# Patient Record
Sex: Female | Born: 1980
Health system: Southern US, Community
[De-identification: ages and names within clinical notes are randomized; demographics above are authoritative.]

## PROBLEM LIST (undated history)

## (undated) DIAGNOSIS — K219 Gastro-esophageal reflux disease without esophagitis: Secondary | ICD-10-CM

## (undated) DIAGNOSIS — Z309 Encounter for contraceptive management, unspecified: Secondary | ICD-10-CM

## (undated) DIAGNOSIS — F419 Anxiety disorder, unspecified: Secondary | ICD-10-CM

## (undated) DIAGNOSIS — R87629 Unspecified abnormal cytological findings in specimens from vagina: Secondary | ICD-10-CM

## (undated) DIAGNOSIS — G43909 Migraine, unspecified, not intractable, without status migrainosus: Secondary | ICD-10-CM

## (undated) DIAGNOSIS — G5601 Carpal tunnel syndrome, right upper limb: Secondary | ICD-10-CM

## (undated) DIAGNOSIS — F32A Depression, unspecified: Secondary | ICD-10-CM

## (undated) DIAGNOSIS — Z975 Presence of (intrauterine) contraceptive device: Secondary | ICD-10-CM

## (undated) DIAGNOSIS — N926 Irregular menstruation, unspecified: Secondary | ICD-10-CM

## (undated) DIAGNOSIS — F329 Major depressive disorder, single episode, unspecified: Secondary | ICD-10-CM

## (undated) DIAGNOSIS — T7840XA Allergy, unspecified, initial encounter: Secondary | ICD-10-CM

## (undated) HISTORY — DX: Allergy, unspecified, initial encounter: T78.40XA

## (undated) HISTORY — DX: Gastro-esophageal reflux disease without esophagitis: K21.9

## (undated) HISTORY — DX: Presence of (intrauterine) contraceptive device: Z97.5

## (undated) HISTORY — DX: Unspecified abnormal cytological findings in specimens from vagina: R87.629

## (undated) HISTORY — PX: NO PAST SURGERIES: SHX2092

## (undated) HISTORY — DX: Encounter for contraceptive management, unspecified: Z30.9

## (undated) HISTORY — DX: Irregular menstruation, unspecified: N92.6

## (undated) HISTORY — DX: Major depressive disorder, single episode, unspecified: F32.9

## (undated) HISTORY — DX: Carpal tunnel syndrome, right upper limb: G56.01

## (undated) HISTORY — DX: Depression, unspecified: F32.A

---

## 2012-01-30 ENCOUNTER — Emergency Department (HOSPITAL_COMMUNITY)
Admission: EM | Admit: 2012-01-30 | Discharge: 2012-01-30 | Disposition: A | Discharge status: Home / Self Care | Payer: BC Managed Care – PPO | Attending: Emergency Medicine | Admitting: Emergency Medicine

## 2012-01-30 ENCOUNTER — Encounter (HOSPITAL_COMMUNITY): Payer: Self-pay | Admitting: Emergency Medicine

## 2012-01-30 DIAGNOSIS — R109 Unspecified abdominal pain: Secondary | ICD-10-CM | POA: Insufficient documentation

## 2012-01-30 DIAGNOSIS — Z79899 Other long term (current) drug therapy: Secondary | ICD-10-CM | POA: Insufficient documentation

## 2012-01-30 HISTORY — DX: Migraine, unspecified, not intractable, without status migrainosus: G43.909

## 2012-01-30 LAB — BASIC METABOLIC PANEL
BUN: 8 mg/dL (ref 6–23)
CO2: 26 mEq/L (ref 19–32)
Calcium: 8.9 mg/dL (ref 8.4–10.5)
Creatinine, Ser: 0.66 mg/dL (ref 0.50–1.10)
Glucose, Bld: 103 mg/dL — ABNORMAL HIGH (ref 70–99)

## 2012-01-30 LAB — URINALYSIS, ROUTINE W REFLEX MICROSCOPIC
Bilirubin Urine: NEGATIVE
Hgb urine dipstick: NEGATIVE
Specific Gravity, Urine: 1.005 (ref 1.005–1.030)
Urobilinogen, UA: 0.2 mg/dL (ref 0.0–1.0)

## 2012-01-30 LAB — WET PREP, GENITAL: Yeast Wet Prep HPF POC: NONE SEEN

## 2012-01-30 LAB — POCT PREGNANCY, URINE: Preg Test, Ur: NEGATIVE

## 2012-01-30 NOTE — ED Provider Notes (Signed)
History     CSN: [id number]  Arrival date & time 01/30/12  4098   First MD Initiated Contact with Patient 01/30/12 [phone]      Chief Complaint  Patient presents with  . Abdominal Pain    (Consider location/radiation/quality/duration/timing/severity/associated sxs/prior treatment) HPI Comments: Patient presents for evaluation of abdominal pain.  She reports around 6 PM yesterday evening as she was driving home from the beach she had suprapubic pressure sensation which lasted for 3 hours and then resolved.  The symptoms resolved on their own, she did not take any medications.  She had an uneventful night but when she woke this morning at 545 she had a return of the suprapubic pressure but also sharp pain which radiated into her umbilicus and left upper quadrant area.  She denies having any flank pain during this event.  The symptoms lasted for approximately 15 minutes and has since resolved.  She still has a little suprapubic pressure sensation but no  remaining sharp pain.  She denies nausea or vomiting, no fevers.  She did have rectal pain which she noticed when she tried to sit down this morning, which has also resolved.  She denies constipation, her last bowel movement was yesterday and normal.  She is not currently sexually active, denies vaginal discharge.  She is in the third week of her OCP pill pack.    Patient is a 31 y.o. female presenting with abdominal pain. The history is provided by the patient.  Abdominal Pain The primary symptoms of the illness include abdominal pain. The primary symptoms of the illness do not include fever, shortness of breath, nausea, vomiting, diarrhea, dysuria or vaginal discharge. The current episode started 3 to 5 hours ago. The onset of the illness was sudden. The problem has been resolved.  Symptoms associated with the illness do not include constipation or urgency.    Past Medical History  Diagnosis Date  . Migraines     History reviewed. No  pertinent past surgical history.  History reviewed. No pertinent family history.  History  Substance Use Topics  . Smoking status: Never Smoker   . Smokeless tobacco: Not on file  . Alcohol Use: Yes     occ    OB History    Grav Para Term Preterm Abortions TAB SAB Ect Mult Living                  Review of Systems  Constitutional: Negative for fever.  HENT: Negative for congestion, sore throat and neck pain.   Eyes: Negative.   Respiratory: Negative for chest tightness and shortness of breath.   Cardiovascular: Negative for chest pain.  Gastrointestinal: Positive for abdominal pain. Negative for nausea, vomiting, diarrhea and constipation.  Genitourinary: Negative.  Negative for dysuria, urgency, flank pain, vaginal discharge, difficulty urinating and pelvic pain.  Musculoskeletal: Negative for joint swelling and arthralgias.  Skin: Negative.  Negative for rash and wound.  Neurological: Negative for dizziness, weakness, light-headedness, numbness and headaches.  Hematological: Negative.   Psychiatric/Behavioral: Negative.     Allergies  Review of patient's allergies indicates no known allergies.  Home Medications   Current Outpatient Rx  Name Route Sig Dispense Refill  . ACETAMINOPHEN 500 MG PO TABS Oral Take 1,000 mg by mouth every 6 (six) hours as needed.    Suzzanne Cloud ESTRADIOL 0.25-35 MG-MCG PO TABS Oral Take 1 tablet by mouth daily.    [name] RIZATRIPTAN BENZOATE 10 MG PO TBDP Oral Take 10 mg by mouth as  needed. Migraine May repeat in 2 hours if needed    . ALLERGY RELIEF EYE DROPS OP Ophthalmic Apply 1 drop to eye daily as needed. Itchy Eyes      BP 126/76  Pulse 72  Temp(Src) 98 F (36.7 C) (Oral)  Resp 16  Ht 5\' 4"  (1.626 m)  Wt 241 lb (109.317 kg)  BMI 41.37 kg/m2  SpO2 100%  LMP 12/29/2011  Physical Exam  Nursing note and vitals reviewed. Constitutional: She appears well-developed and well-nourished.  HENT:  Head: Normocephalic and atraumatic.   Eyes: Conjunctivae are normal.  Neck: Normal range of motion.  Cardiovascular: Normal rate, regular rhythm, normal heart sounds and intact distal pulses.   Pulmonary/Chest: Effort normal and breath sounds normal. She has no wheezes.  Abdominal: Soft. Bowel sounds are normal. There is no tenderness.  Genitourinary: Vagina normal and uterus normal. No erythema around the vagina. No vaginal discharge found.       Slight suprapubic pressure sensation on bimanual exam.  No cervical motion tenderness, no adnexal tenderness or mass appreciated.  Musculoskeletal: Normal range of motion.  Neurological: She is alert.  Skin: Skin is warm and dry.  Psychiatric: She has a normal mood and affect.    ED Course  Procedures (including critical care time)  Labs Reviewed  WET PREP, GENITAL - Abnormal; Notable for the following:    Clue Cells Wet Prep HPF POC FEW (*)    WBC, Wet Prep HPF POC FEW (*)    All other components within normal limits  BASIC METABOLIC PANEL - Abnormal; Notable for the following:    Glucose, Bld 103 (*)    All other components within normal limits  URINALYSIS, ROUTINE W REFLEX MICROSCOPIC  PREGNANCY, URINE  POCT PREGNANCY, URINE  GC/CHLAMYDIA PROBE AMP, GENITAL  RPR   No results found.   1. Abdominal pain       MDM  Abdominal pain of unclear etiology,  But now resolved.  Pt discussed with Dr. Manus Gunning who also saw pt.  Encouraged f/u with pcp in 1 day if symptoms return.  Labs reviewed.  No sx to suggest bacterial vaginosis.        Burgess Amor, [place] 01/30/12 1044

## 2012-01-30 NOTE — Discharge Instructions (Signed)
Followup with your doctor for a recheck of your symptoms if your pain persists.  Your exam and labs performed today do not show [place] the reason for your symptoms at this time.  Symptoms can change over time,  However.  Get rechecked if they return,  Worsen or change in any way.

## 2012-01-30 NOTE — ED Provider Notes (Signed)
Medical screening examination/treatment/procedure(s) were conducted as a shared visit with non-physician practitioner(s) and myself.  I personally evaluated the patient during the encounter  Suprapubic pressure last night.  UA neg.  Pelvic neg. Abdomen soft and nontender.  Glynn Octave, MD 01/30/12 1520

## 2012-01-30 NOTE — ED Notes (Signed)
Pt states abd pain since last night. Denies vaginal discharge.pressure and pain on urination this am.

## 2012-01-30 NOTE — ED Notes (Signed)
EDPA is in the room with pt at this time.

## 2012-01-31 LAB — RPR: RPR Ser Ql: NONREACTIVE

## 2012-01-31 LAB — GC/CHLAMYDIA PROBE AMP, GENITAL: GC Probe Amp, Genital: NEGATIVE

## 2012-10-03 NOTE — L&D Delivery Note (Signed)
Delivery Note At 7:36 AM a viable female was delivered via Vaginal, Spontaneous Delivery (Presentation: ; Occiput Anterior) with immediate skin-to-skin on mother's abdomen. Cord was allowed to continue pulsating and then clamped, cut by family member. Cord blood sample obtained.   APGAR: 9, 9; weight 6-12.   Placenta status: Intact, Spontaneous. Cord: 3 vessels and short.  Cord pH: Not obtained  Anesthesia: Epidural  Episiotomy: None Lacerations: None Suture Repair: N/A Est. Blood Loss (mL):  Mom to postpartum.  Baby to Couplet care / Skin to Skin. Dorathy Kinsman, CNM was present and provided assistance during the delivery.   Hazeline Junker 08/28/2013, 8:04 AM  I was present for and assisted in the delivery of baby and placenta and agree with above.  Slaughters, CNM 08/28/2013 11:06 AM

## 2013-01-08 ENCOUNTER — Other Ambulatory Visit: Payer: Self-pay | Admitting: Obstetrics & Gynecology

## 2013-01-08 DIAGNOSIS — O3680X0 Pregnancy with inconclusive fetal viability, not applicable or unspecified: Secondary | ICD-10-CM

## 2013-01-15 ENCOUNTER — Other Ambulatory Visit: Payer: Self-pay | Admitting: Obstetrics & Gynecology

## 2013-01-15 ENCOUNTER — Ambulatory Visit (INDEPENDENT_AMBULATORY_CARE_PROVIDER_SITE_OTHER): Payer: BC Managed Care – PPO

## 2013-01-15 DIAGNOSIS — O3680X0 Pregnancy with inconclusive fetal viability, not applicable or unspecified: Secondary | ICD-10-CM

## 2013-01-15 NOTE — Progress Notes (Signed)
U/S(6+1wks)-transvaginal u/s performed, single IUP with + FCA noted FHR=108BPM,CRL c/w dates, cx long and closed, bilateral adnexa wnl, no free fluid noted.

## 2013-01-16 LAB — US OB TRANSVAGINAL

## 2013-01-23 ENCOUNTER — Telehealth: Payer: Self-pay | Admitting: Adult Health

## 2013-01-23 NOTE — Telephone Encounter (Signed)
Pt states [redacted] weeks pregnant and having migraines, informed pt can take OTC tylenol for headaches, pt stated had not been drinking as much caffeine, explained to pt headaches/migraines could be from recent decrease in caffeine intake. Pt to call back if no improvement.

## 2013-01-29 ENCOUNTER — Other Ambulatory Visit (HOSPITAL_COMMUNITY)
Admission: RE | Admit: 2013-01-29 | Discharge: 2013-01-29 | Disposition: A | Discharge status: Home / Self Care | Payer: BC Managed Care – PPO | Source: Ambulatory Visit | Attending: Obstetrics & Gynecology | Admitting: Obstetrics & Gynecology

## 2013-01-29 ENCOUNTER — Encounter: Payer: Self-pay | Admitting: Women's Health

## 2013-01-29 ENCOUNTER — Ambulatory Visit (INDEPENDENT_AMBULATORY_CARE_PROVIDER_SITE_OTHER): Payer: BC Managed Care – PPO | Admitting: Women's Health

## 2013-01-29 VITALS — BP 142/80 | Wt 247.8 lb

## 2013-01-29 DIAGNOSIS — Z1151 Encounter for screening for human papillomavirus (HPV): Secondary | ICD-10-CM | POA: Insufficient documentation

## 2013-01-29 DIAGNOSIS — Z3480 Encounter for supervision of other normal pregnancy, unspecified trimester: Secondary | ICD-10-CM | POA: Insufficient documentation

## 2013-01-29 DIAGNOSIS — Z3481 Encounter for supervision of other normal pregnancy, first trimester: Secondary | ICD-10-CM

## 2013-01-29 DIAGNOSIS — E669 Obesity, unspecified: Secondary | ICD-10-CM

## 2013-01-29 DIAGNOSIS — O99211 Obesity complicating pregnancy, first trimester: Secondary | ICD-10-CM

## 2013-01-29 DIAGNOSIS — Z01419 Encounter for gynecological examination (general) (routine) without abnormal findings: Secondary | ICD-10-CM | POA: Insufficient documentation

## 2013-01-29 DIAGNOSIS — Z136 Encounter for screening for cardiovascular disorders: Secondary | ICD-10-CM

## 2013-01-29 DIAGNOSIS — O99019 Anemia complicating pregnancy, unspecified trimester: Secondary | ICD-10-CM

## 2013-01-29 DIAGNOSIS — Z113 Encounter for screening for infections with a predominantly sexual mode of transmission: Secondary | ICD-10-CM | POA: Insufficient documentation

## 2013-01-29 DIAGNOSIS — O99210 Obesity complicating pregnancy, unspecified trimester: Secondary | ICD-10-CM

## 2013-01-29 DIAGNOSIS — Z331 Pregnant state, incidental: Secondary | ICD-10-CM

## 2013-01-29 DIAGNOSIS — Z1389 Encounter for screening for other disorder: Secondary | ICD-10-CM

## 2013-01-29 LAB — CBC
HCT: 35 % — ABNORMAL LOW (ref 36.0–46.0)
MCH: 26.9 pg (ref 26.0–34.0)
MCHC: 32.9 g/dL (ref 30.0–36.0)
RDW: 13.9 % (ref 11.5–15.5)

## 2013-01-29 LAB — POCT URINALYSIS DIPSTICK
Glucose, UA: NEGATIVE
Nitrite, UA: NEGATIVE

## 2013-01-29 NOTE — Progress Notes (Signed)
  Subjective:    Kristin [name] is a 32 y.o. G43P1001 african-american female at [redacted]w[redacted]d by LMP being seen today for her first obstetrical visit.  Her obstetrical history is significant for obesity.  Pt denies h/o HTN or DM. States her brother just passed away unexpectedly the day before she found out about pregnancy from unknown cause- it was determined he had a brain cyst, swelling of the brain, and enlarged heart.  Pregnancy history fully reviewed.  Patient reports nausea w/o vomiting-declines needing antiemetics at this time, cramping w/o bleeding.   Filed Vitals:   01/29/13 1533  BP: 142/80  Weight: 247 lb 12.8 oz (112.401 kg)    HISTORY: OB History   Grav Para Term Preterm Abortions TAB SAB Ect Mult Living   2 1 1       1      # Outc Date GA Lbr Len/2nd Wgt Sex Del Anes PTL Lv   1 TRM 2000 [redacted]w[redacted]d  7lb(3.175kg) M SVD None  Yes   2 CUR              Past Medical History  Diagnosis Date  . Migraines    Past Surgical History  Procedure Laterality Date  . No past surgeries     Family History  Problem Relation Age of Onset  . Other Mother     enlarged heart  . Diabetes Mother   . Hypertension Mother   . Hyperlipidemia Mother   . Hypertension Father   . Hyperlipidemia Father   . Other Brother     enlarged heart     Exam       Pelvic Exam:    Perineum: Normal Perineum   Vulva: normal   Vagina:  normal mucosa, normal discharge, no palpable nodules   Uterus    normal size/shape/contour for 8wks     Cervix: normal   Adnexa: Not palpable   Urinary:  urethral meatus normal    System: Breast:  deferred   Skin: normal coloration and turgor, no rashes    Neurologic: oriented, normal, normal mood   Extremities: normal strength, tone, and muscle mass   HEENT PERRLA   Mouth/Teeth mucous membranes moist, pharynx normal without lesions   Neck supple and no masses   Cardiovascular: regular rate and rhythm   Respiratory:  appears well, vitals normal, no respiratory  distress, acyanotic, normal RR   Abdomen: soft, non-tender; bowel sounds normal; no masses,  no organomegaly       Thin prep pap w/ HPV cotesting obtained today +FCA via informal transabdominal u/s   Assessment:    Pregnancy: G2P1001 Patient Active Problem List   Diagnosis Date Noted  . Supervision of other normal pregnancy 01/29/2013    Priority: High   [redacted]w[redacted]d SIUP G2P1001 BMI 42 Nausea of pregnancy Cramping w/o bleeding Elevated BP today H/O migraines    Plan:    Initial labs drawn, including TSH and A1C Continue Prenatal vitamins. Problem list reviewed and updated. Genetic Screening discussed Integrated Screen: requested. Ultrasound discussed; fetal survey: requested. CF screening requested Discussed nausea relief measures, to notify us if wants antiemetics Reviewed warning s/s to report Follow up Thurs for BP recheck- if still elevated obtain 24hr urine F/U 4wks for 1st IT/NT and LROB  Marge Duncans 01/29/2013

## 2013-01-29 NOTE — Patient Instructions (Signed)
Pregnancy - First Trimester During sexual intercourse, millions of sperm go into the vagina. Only 1 sperm will penetrate and fertilize the female egg while it is in the Fallopian tube. One week later, the fertilized egg implants into the wall of the uterus. An embryo begins to develop into a baby. At 6 to 8 weeks, the eyes and face are formed and the heartbeat can be seen on ultrasound. At the end of 12 weeks (first trimester), all the baby's organs are formed. Now that you are pregnant, you will want to do everything you can to have a healthy baby. Two of the most important things are to get good prenatal care and follow your caregiver's instructions. Prenatal care is all the medical care you receive before the baby's birth. It is given to prevent, find, and treat problems during the pregnancy and childbirth. PRENATAL EXAMS  During prenatal visits, your weight, blood pressure and urine are checked. This is done to make sure you are healthy and progressing normally during the pregnancy.  A pregnant woman should gain 25 to 35 pounds during the pregnancy. However, if you are over weight or underweight, your caregiver will advise you regarding your weight.  Your caregiver will ask and answer questions for you.  Blood work, cervical cultures, other necessary tests and a Pap test are done during your prenatal exams. These tests are done to check on your health and the probable health of your baby. Tests are strongly recommended and done for HIV with your permission. This is the virus that causes AIDS. These tests are done because medications can be given to help prevent your baby from being born with this infection should you have been infected without knowing it. Blood work is also used to find out your blood type, previous infections and follow your blood levels (hemoglobin).  Low hemoglobin (anemia) is common during pregnancy. Iron and vitamins are given to help prevent this. Later in the pregnancy, blood  tests for diabetes will be done along with any other tests if any problems develop. You may need tests to make sure you and the baby are doing well.  You may need other tests to make sure you and the baby are doing well. CHANGES DURING THE FIRST TRIMESTER (THE FIRST 3 MONTHS OF PREGNANCY) Your body goes through many changes during pregnancy. They vary from person to person. Talk to your caregiver about changes you notice and are concerned about. Changes can include:  Your menstrual period stops.  The egg and sperm carry the genes that determine what you look like. Genes from you and your partner are forming a baby. The female genes determine whether the baby is a boy or a girl.  Your body increases in girth and you may feel bloated.  Feeling sick to your stomach (nauseous) and throwing up (vomiting). If the vomiting is uncontrollable, call your caregiver.  Your breasts will begin to enlarge and become tender.  Your nipples may stick out more and become darker.  The need to urinate more. Painful urination may mean you have a bladder infection.  Tiring easily.  Loss of appetite.  Cravings for certain kinds of food.  At first, you may gain or lose a couple of pounds.  You may have changes in your emotions from day to day (excited to be pregnant or concerned something may go wrong with the pregnancy and baby).  You may have more vivid and strange dreams. HOME CARE INSTRUCTIONS   It is very important   to avoid all smoking, alcohol and un-prescribed drugs during your pregnancy. These affect the formation and growth of the baby. Avoid chemicals while pregnant to ensure the delivery of a healthy infant.  Start your prenatal visits by the 12th week of pregnancy. They are usually scheduled monthly at first, then more often in the last 2 months before delivery. Keep your caregiver's appointments. Follow your caregiver's instructions regarding medication use, blood and lab tests, exercise, and  diet.  During pregnancy, you are providing food for you and your baby. Eat regular, well-balanced meals. Choose foods such as meat, fish, milk and other low fat dairy products, vegetables, fruits, and whole-grain breads and cereals. Your caregiver will tell you of the ideal weight gain.  You can help morning sickness by keeping soda crackers at the bedside. Eat a couple before arising in the morning. You may want to use the crackers without salt on them.  Eating 4 to 5 small meals rather than 3 large meals a day also may help the nausea and vomiting.  Drinking liquids between meals instead of during meals also seems to help nausea and vomiting.  A physical sexual relationship may be continued throughout pregnancy if there are no other problems. Problems may be early (premature) leaking of amniotic fluid from the membranes, vaginal bleeding, or belly (abdominal) pain.  Exercise regularly if there are no restrictions. Check with your caregiver or physical therapist if you are unsure of the safety of some of your exercises. Greater weight gain will occur in the last 2 trimesters of pregnancy. Exercising will help:  Control your weight.  Keep you in shape.  Prepare you for labor and delivery.  Help you lose your pregnancy weight after you deliver your baby.  Wear a good support or jogging bra for breast tenderness during pregnancy. This may help if worn during sleep too.  Ask when prenatal classes are available. Begin classes when they are offered.  Do not use hot tubs, steam rooms or saunas.  Wear your seat belt when driving. This protects you and your baby if you are in an accident.  Avoid raw meat, uncooked cheese, cat litter boxes and soil used by cats throughout the pregnancy. These carry germs that can cause birth defects in the baby.  The first trimester is a good time to visit your dentist for your dental health. Getting your teeth cleaned is OK. Use a softer toothbrush and brush  gently during pregnancy.  Ask for help if you have financial, counseling or nutritional needs during pregnancy. Your caregiver will be able to offer counseling for these needs as well as refer you for other special needs.  Do not take any medications or herbs unless told by your caregiver.  Inform your caregiver if there is any mental or physical domestic violence.  Make a list of emergency phone numbers of family, friends, hospital, and police and fire departments.  Write down your questions. Take them to your prenatal visit.  Do not douche.  Do not cross your legs.  If you have to stand for long periods of time, rotate you feet or take small steps in a circle.  You may have more vaginal secretions that may require a sanitary pad. Do not use tampons or scented sanitary pads. MEDICATIONS AND DRUG USE IN PREGNANCY  Take prenatal vitamins as directed. The vitamin should contain 1 milligram of folic acid. Keep all vitamins out of reach of children. Only a couple vitamins or tablets containing iron may be   fatal to a baby or young child when ingested.  Avoid use of all medications, including herbs, over-the-counter medications, not prescribed or suggested by your caregiver. Only take over-the-counter or prescription medicines for pain, discomfort, or fever as directed by your caregiver. Do not use aspirin, ibuprofen, or naproxen unless directed by your caregiver.  Let your caregiver also know about herbs you may be using.  Alcohol is related to a number of birth defects. This includes fetal alcohol syndrome. All alcohol, in any form, should be avoided completely. Smoking will cause low birth rate and premature babies.  Street or illegal drugs are very harmful to the baby. They are absolutely forbidden. A baby born to an addicted mother will be addicted at birth. The baby will go through the same withdrawal an adult does.  Let your caregiver know about any medications that you have to take  and for what reason you take them. MISCARRIAGE IS COMMON DURING PREGNANCY A miscarriage does not mean you did something wrong. It is not a reason to worry about getting pregnant again. Your caregiver will help you with questions you may have. If you have a miscarriage, you may need minor surgery. SEEK MEDICAL CARE IF:  You have any concerns or worries during your pregnancy. It is better to call with your questions if you feel they cannot wait, rather than worry about them. SEEK IMMEDIATE MEDICAL CARE IF:   An unexplained oral temperature above 102 F (38.9 C) develops, or as your caregiver suggests.  You have leaking of fluid from the vagina (birth canal). If leaking membranes are suspected, take your temperature and inform your caregiver of this when you call.  There is vaginal spotting or bleeding. Notify your caregiver of the amount and how many pads are used.  You develop a bad smelling vaginal discharge with a change in the color.  You continue to feel sick to your stomach (nauseated) and have no relief from remedies suggested. You vomit blood or coffee ground-like materials.  You lose more than 2 pounds of weight in 1 week.  You gain more than 2 pounds of weight in 1 week and you notice swelling of your face, hands, feet, or legs.  You gain 5 pounds or more in 1 week (even if you do not have swelling of your hands, face, legs, or feet).  You get exposed to German measles and have never had them.  You are exposed to fifth disease or chickenpox.  You develop belly (abdominal) pain. Round ligament discomfort is a common non-cancerous (benign) cause of abdominal pain in pregnancy. Your caregiver still must evaluate this.  You develop headache, fever, diarrhea, pain with urination, or shortness of breath.  You fall or are in a car accident or have any kind of trauma.  There is mental or physical violence in your home. Document Released: 09/13/2001 Document Revised: 12/12/2011  Document Reviewed: 03/17/2009 [employer] Patient Information 2013 [employer]  

## 2013-01-29 NOTE — Progress Notes (Signed)
menstral cramps in lower belly. New ob packet given. Consents signed.

## 2013-01-30 LAB — URINALYSIS
Hgb urine dipstick: NEGATIVE
Leukocytes, UA: NEGATIVE
Protein, ur: NEGATIVE mg/dL
Urobilinogen, UA: 0.2 mg/dL (ref 0.0–1.0)

## 2013-01-30 LAB — OXYCODONE SCREEN, UA, RFLX CONFIRM: Oxycodone Screen, Ur: NEGATIVE ng/mL

## 2013-01-30 LAB — DRUG SCREEN, URINE, NO CONFIRMATION
Cocaine Metabolites: NEGATIVE
Methadone: NEGATIVE
Phencyclidine (PCP): NEGATIVE
Propoxyphene: NEGATIVE

## 2013-01-30 LAB — RUBELLA SCREEN: Rubella: 3.29 Index — ABNORMAL HIGH (ref <0.90)

## 2013-01-30 LAB — ABO AND RH: Rh Type: POSITIVE

## 2013-01-30 LAB — VARICELLA ZOSTER ANTIBODY, IGG: Varicella IgG: 2062 Index — ABNORMAL HIGH (ref <135.00)

## 2013-01-30 LAB — HEMOGLOBIN A1C: Hgb A1c MFr Bld: 4.9 % (ref <5.7)

## 2013-01-30 LAB — RPR

## 2013-01-30 LAB — SICKLE CELL SCREEN: Sickle Cell Screen: NEGATIVE

## 2013-01-31 ENCOUNTER — Encounter: Payer: Self-pay | Admitting: Obstetrics & Gynecology

## 2013-01-31 ENCOUNTER — Ambulatory Visit (INDEPENDENT_AMBULATORY_CARE_PROVIDER_SITE_OTHER): Payer: BC Managed Care – PPO | Admitting: Obstetrics & Gynecology

## 2013-01-31 VITALS — BP 112/72 | Wt 246.0 lb

## 2013-01-31 DIAGNOSIS — Z331 Pregnant state, incidental: Secondary | ICD-10-CM

## 2013-01-31 DIAGNOSIS — E669 Obesity, unspecified: Secondary | ICD-10-CM

## 2013-01-31 DIAGNOSIS — O99019 Anemia complicating pregnancy, unspecified trimester: Secondary | ICD-10-CM

## 2013-01-31 DIAGNOSIS — Z1389 Encounter for screening for other disorder: Secondary | ICD-10-CM

## 2013-01-31 LAB — POCT URINALYSIS DIPSTICK
Blood, UA: NEGATIVE
Ketones, UA: NEGATIVE
Protein, UA: NEGATIVE

## 2013-01-31 LAB — URINE CULTURE: Colony Count: 40000

## 2013-01-31 NOTE — Progress Notes (Signed)
BP good no meds needed at this point Pt understands she may need them later Keep scheduled appt

## 2013-01-31 NOTE — Progress Notes (Signed)
Cramping in lower belly.

## 2013-02-01 LAB — CYSTIC FIBROSIS DIAGNOSTIC STUDY

## 2013-02-05 ENCOUNTER — Telehealth: Payer: Self-pay | Admitting: Obstetrics & Gynecology

## 2013-02-05 MED ORDER — PRENATAL PLUS 27-1 MG PO TABS: 1.0000 | ORAL_TABLET | Freq: Every day | ORAL | Status: DC

## 2013-02-05 NOTE — Telephone Encounter (Signed)
Pt informed prenatal plus e-scribed to pharmacy

## 2013-02-28 ENCOUNTER — Encounter: Payer: Self-pay | Admitting: Advanced Practice Midwife

## 2013-02-28 ENCOUNTER — Other Ambulatory Visit: Payer: Self-pay | Admitting: Advanced Practice Midwife

## 2013-02-28 ENCOUNTER — Ambulatory Visit (INDEPENDENT_AMBULATORY_CARE_PROVIDER_SITE_OTHER): Payer: Medicaid Other | Admitting: Advanced Practice Midwife

## 2013-02-28 ENCOUNTER — Ambulatory Visit (INDEPENDENT_AMBULATORY_CARE_PROVIDER_SITE_OTHER): Payer: Medicaid Other

## 2013-02-28 VITALS — BP 120/72 | Wt 246.0 lb

## 2013-02-28 DIAGNOSIS — Z36 Encounter for antenatal screening of mother: Secondary | ICD-10-CM

## 2013-02-28 DIAGNOSIS — Z1389 Encounter for screening for other disorder: Secondary | ICD-10-CM

## 2013-02-28 DIAGNOSIS — O99210 Obesity complicating pregnancy, unspecified trimester: Secondary | ICD-10-CM

## 2013-02-28 DIAGNOSIS — E669 Obesity, unspecified: Secondary | ICD-10-CM

## 2013-02-28 DIAGNOSIS — Z331 Pregnant state, incidental: Secondary | ICD-10-CM

## 2013-02-28 DIAGNOSIS — O99019 Anemia complicating pregnancy, unspecified trimester: Secondary | ICD-10-CM

## 2013-02-28 DIAGNOSIS — Z3481 Encounter for supervision of other normal pregnancy, first trimester: Secondary | ICD-10-CM

## 2013-02-28 LAB — POCT URINALYSIS DIPSTICK
Glucose, UA: NEGATIVE
Leukocytes, UA: NEGATIVE
Nitrite, UA: NEGATIVE

## 2013-02-28 NOTE — Progress Notes (Signed)
No c/o at this time. Had NT today (see report).  Routine questions about pregnancy answered.  F/U in 4 weeks for 2nd IT/LROB.

## 2013-02-28 NOTE — Progress Notes (Signed)
Cramping. 1st IT NT today.

## 2013-02-28 NOTE — Progress Notes (Signed)
U/S(12+3wks)-single IUP with +FCA, CRL c/w dates, cx long and closed, bilateral adnexa wnl, NB present, **NT=2.45mm**

## 2013-03-02 ENCOUNTER — Encounter: Payer: Self-pay | Admitting: Women's Health

## 2013-03-07 ENCOUNTER — Ambulatory Visit (INDEPENDENT_AMBULATORY_CARE_PROVIDER_SITE_OTHER): Payer: Medicaid Other | Admitting: Adult Health

## 2013-03-07 ENCOUNTER — Telehealth: Payer: Self-pay | Admitting: Advanced Practice Midwife

## 2013-03-07 ENCOUNTER — Encounter: Payer: Self-pay | Admitting: Adult Health

## 2013-03-07 VITALS — BP 112/60 | Wt 248.0 lb

## 2013-03-07 DIAGNOSIS — Z331 Pregnant state, incidental: Secondary | ICD-10-CM

## 2013-03-07 DIAGNOSIS — Z1389 Encounter for screening for other disorder: Secondary | ICD-10-CM

## 2013-03-07 DIAGNOSIS — O99019 Anemia complicating pregnancy, unspecified trimester: Secondary | ICD-10-CM

## 2013-03-07 DIAGNOSIS — O99210 Obesity complicating pregnancy, unspecified trimester: Secondary | ICD-10-CM

## 2013-03-07 DIAGNOSIS — O9989 Other specified diseases and conditions complicating pregnancy, childbirth and the puerperium: Secondary | ICD-10-CM

## 2013-03-07 DIAGNOSIS — E669 Obesity, unspecified: Secondary | ICD-10-CM

## 2013-03-07 DIAGNOSIS — G43909 Migraine, unspecified, not intractable, without status migrainosus: Secondary | ICD-10-CM

## 2013-03-07 LAB — POCT URINALYSIS DIPSTICK
Ketones, UA: NEGATIVE
Protein, UA: NEGATIVE

## 2013-03-07 MED ORDER — BUTALBITAL-APAP-CAFFEINE 50-325-40 MG PO TABS: 1.0000 | ORAL_TABLET | ORAL | Status: DC | PRN

## 2013-03-07 NOTE — Progress Notes (Signed)
Kristin [name] had a headache last night that went away after tylenol, and she woke up with it back this am has some nausea and discomfort left side of head,no vision changes she has a history of migraines,DTRs +1, no RUQ pain,CN II-XII intact has good strength bilaterally in upper extremities, no bleeding has what sounds like is round ligament pain. Increase water and rest, will try Fioricet for the headache, go home, take meds and push fluids, call if not better. Discussed with Dr. Emelda Fear.Note given to return to work 6/9.Keep appt as scheduled.

## 2013-03-07 NOTE — Telephone Encounter (Signed)
Left message. JSY 

## 2013-03-07 NOTE — Patient Instructions (Addendum)
Migraine Headache A migraine headache is an intense, throbbing pain on one or both sides of your head. A migraine can last for 30 minutes to several hours. CAUSES  The exact cause of a migraine headache is not always known. However, a migraine may be caused when nerves in the brain become irritated and release chemicals that cause inflammation. This causes pain. SYMPTOMS  Pain on one or both sides of your head.  Pulsating or throbbing pain.  Severe pain that prevents daily activities.  Pain that is aggravated by any physical activity.  Nausea, vomiting, or both.  Dizziness.  Pain with exposure to bright lights, loud noises, or activity.  General sensitivity to bright lights, loud noises, or smells. Before you get a migraine, you may get warning signs that a migraine is coming (aura). An aura may include:  Seeing flashing lights.  Seeing bright spots, halos, or zig-zag lines.  Having tunnel vision or blurred vision.  Having feelings of numbness or tingling.  Having trouble talking.  Having muscle weakness. MIGRAINE TRIGGERS  Alcohol.  Smoking.  Stress.  Menstruation.  Aged cheeses.  Foods or drinks that contain nitrates, glutamate, aspartame, or tyramine.  Lack of sleep.  Chocolate.  Caffeine.  Hunger.  Physical exertion.  Fatigue.  Medicines used to treat chest pain (nitroglycerine), birth control pills, estrogen, and some blood pressure medicines. DIAGNOSIS  A migraine headache is often diagnosed based on:  Symptoms.  Physical examination.  A CT scan or MRI of your head. TREATMENT Medicines may be given for pain and nausea. Medicines can also be given to help prevent recurrent migraines.  HOME CARE INSTRUCTIONS  Only take over-the-counter or prescription medicines for pain or discomfort as directed by your caregiver. The use of long-term narcotics is not recommended.  Lie down in a dark, quiet room when you have a migraine.  Keep a journal  to find out what may trigger your migraine headaches. For example, write down:  What you eat and drink.  How much sleep you get.  Any change to your diet or medicines.  Limit alcohol consumption.  Quit smoking if you smoke.  Get 7 to 9 hours of sleep, or as recommended by your caregiver.  Limit stress.  Keep lights dim if bright lights bother you and make your migraines worse. SEEK IMMEDIATE MEDICAL CARE IF:   Your migraine becomes severe.  You have a fever.  You have a stiff neck.  You have vision loss.  You have muscular weakness or loss of muscle control.  You start losing your balance or have trouble walking.  You feel faint or pass out.  You have severe symptoms that are different from your first symptoms. MAKE SURE YOU:   Understand these instructions.  Will watch your condition.  Will get help right away if you are not doing well or get worse. Document Released: 09/19/2005 Document Revised: 12/12/2011 Document Reviewed: 09/09/2011 [facility] Patient Information 2014 [place], [place]. Follow up as scheduled Try fioricet

## 2013-03-07 NOTE — Telephone Encounter (Signed)
Pt came in for appt today and saw Cyril Mourning, NP for headaches. JSY

## 2013-03-07 NOTE — Progress Notes (Signed)
Pt here today for Bad headaches started last night took 2 tylenol and it went away, woke up this morning with the headache again. Pt stated she has a history of migraines. Pt stated she has had a little cramping but denies any bleeding.

## 2013-03-11 ENCOUNTER — Telehealth: Payer: Self-pay | Admitting: *Deleted

## 2013-03-11 ENCOUNTER — Encounter: Payer: Self-pay | Admitting: Obstetrics & Gynecology

## 2013-03-11 ENCOUNTER — Ambulatory Visit (INDEPENDENT_AMBULATORY_CARE_PROVIDER_SITE_OTHER): Payer: Medicaid Other | Admitting: Obstetrics & Gynecology

## 2013-03-11 VITALS — BP 110/60 | Wt 246.0 lb

## 2013-03-11 DIAGNOSIS — Z3482 Encounter for supervision of other normal pregnancy, second trimester: Secondary | ICD-10-CM

## 2013-03-11 DIAGNOSIS — Z331 Pregnant state, incidental: Secondary | ICD-10-CM

## 2013-03-11 DIAGNOSIS — O9989 Other specified diseases and conditions complicating pregnancy, childbirth and the puerperium: Secondary | ICD-10-CM

## 2013-03-11 DIAGNOSIS — Z1389 Encounter for screening for other disorder: Secondary | ICD-10-CM

## 2013-03-11 LAB — POCT URINALYSIS DIPSTICK
Blood, UA: NEGATIVE
Ketones, UA: NEGATIVE
Leukocytes, UA: NEGATIVE
Nitrite, UA: NEGATIVE

## 2013-03-11 NOTE — Progress Notes (Signed)
TAKING FIORICET for headache, not helping just make her sleepy.

## 2013-03-11 NOTE — Progress Notes (Signed)
Patient with headaches on left front and behind left eye.  On fioricet which helps Patient does have headache history.  Instructed patient likely to persist until 18 weeks or so, when progesterone peaks.  Continue to take fioricet which is not perfect but will be of help.  Pt understands and i reassured.

## 2013-03-11 NOTE — Telephone Encounter (Signed)
Pt continues to c/o "persistent headaches, migraines." Pt has been taken Fioricet as prescribed at last visit and pushing fluids for the headaches but no relief.

## 2013-03-11 NOTE — Telephone Encounter (Signed)
Has had headache all week end worse today to come at 1:30 pm today to be seen

## 2013-03-28 ENCOUNTER — Ambulatory Visit (INDEPENDENT_AMBULATORY_CARE_PROVIDER_SITE_OTHER): Payer: Medicaid Other | Admitting: Obstetrics & Gynecology

## 2013-03-28 ENCOUNTER — Other Ambulatory Visit: Payer: Self-pay | Admitting: Obstetrics & Gynecology

## 2013-03-28 VITALS — BP 108/68 | Wt 246.8 lb

## 2013-03-28 DIAGNOSIS — Z331 Pregnant state, incidental: Secondary | ICD-10-CM

## 2013-03-28 DIAGNOSIS — O99019 Anemia complicating pregnancy, unspecified trimester: Secondary | ICD-10-CM

## 2013-03-28 DIAGNOSIS — Z1389 Encounter for screening for other disorder: Secondary | ICD-10-CM

## 2013-03-28 LAB — POCT URINALYSIS DIPSTICK
Leukocytes, UA: NEGATIVE
Protein, UA: NEGATIVE

## 2013-03-28 NOTE — Progress Notes (Signed)
C/o headaches, taking fioricet as prescribed. Some cramping.

## 2013-03-28 NOTE — Progress Notes (Signed)
BP weight and urine results all reviewed and noted. Patient reports occasional fluttery fetal movement, denies any bleeding and no rupture of membranes symptoms or regular contractions. Patient is without complaints 2nd IT today and 20 week sonogram next visit. All questions were answered.

## 2013-03-28 NOTE — Patient Instructions (Addendum)
Pregnancy - Second Trimester The second trimester of pregnancy (3 to 6 months) is a period of rapid growth for you and your baby. At the end of the sixth month, your baby is about 9 inches long and weighs 1 1/2 pounds. You will begin to feel the baby move between 18 and 20 weeks of the pregnancy. This is called quickening. Weight gain is faster. A clear fluid (colostrum) may leak out of your breasts. You may feel small contractions of the womb (uterus). This is known as false labor or Braxton-Hicks contractions. This is like a practice for labor when the baby is ready to be born. Usually, the problems with morning sickness have usually passed by the end of your first trimester. Some women develop small dark blotches (called cholasma, mask of pregnancy) on their face that usually goes away after the baby is born. Exposure to the sun makes the blotches worse. Acne may also develop in some pregnant women and pregnant women who have acne, may find that it goes away. PRENATAL EXAMS  Blood work may continue to be done during prenatal exams. These tests are done to check on your health and the probable health of your baby. Blood work is used to follow your blood levels (hemoglobin). Anemia (low hemoglobin) is common during pregnancy. Iron and vitamins are given to help prevent this. You will also be checked for diabetes between 24 and 28 weeks of the pregnancy. Some of the previous blood tests may be repeated.  The size of the uterus is measured during each visit. This is to make sure that the baby is continuing to grow properly according to the dates of the pregnancy.  Your blood pressure is checked every prenatal visit. This is to make sure you are not getting toxemia.  Your urine is checked to make sure you do not have an infection, diabetes or protein in the urine.  Your weight is checked often to make sure gains are happening at the suggested rate. This is to ensure that both you and your baby are  growing normally.  Sometimes, an ultrasound is performed to confirm the proper growth and development of the baby. This is a test which bounces harmless sound waves off the baby so your caregiver can more accurately determine due dates. Sometimes, a test is done on the amniotic fluid surrounding the baby. This test is called an amniocentesis. The amniotic fluid is obtained by sticking a needle into the belly (abdomen). This is done to check the chromosomes in instances where there is a concern about possible genetic problems with the baby. It is also sometimes done near the end of pregnancy if an early delivery is required. In this case, it is done to help make sure the baby's lungs are mature enough for the baby to live outside of the womb. CHANGES OCCURING IN THE SECOND TRIMESTER OF PREGNANCY Your body goes through many changes during pregnancy. They vary from person to person. Talk to your caregiver about changes you notice that you are concerned about.  During the second trimester, you will likely have an increase in your appetite. It is normal to have cravings for certain foods. This varies from person to person and pregnancy to pregnancy.  Your lower abdomen will begin to bulge.  You may have to urinate more often because the uterus and baby are pressing on your bladder. It is also common to get more bladder infections during pregnancy. You can help this by drinking lots of fluids   and emptying your bladder before and after intercourse.  You may begin to get stretch marks on your hips, abdomen, and breasts. These are normal changes in the body during pregnancy. There are no exercises or medicines to take that prevent this change.  You may begin to develop swollen and bulging veins (varicose veins) in your legs. Wearing support hose, elevating your feet for 15 minutes, 3 to 4 times a day and limiting salt in your diet helps lessen the problem.  Heartburn may develop as the uterus grows and  pushes up against the stomach. Antacids recommended by your caregiver helps with this problem. Also, eating smaller meals 4 to 5 times a day helps.  Constipation can be treated with a stool softener or adding bulk to your diet. Drinking lots of fluids, and eating vegetables, fruits, and whole grains are helpful.  Exercising is also helpful. If you have been very active up until your pregnancy, most of these activities can be continued during your pregnancy. If you have been less active, it is helpful to start an exercise program such as walking.  Hemorrhoids may develop at the end of the second trimester. Warm sitz baths and hemorrhoid cream recommended by your caregiver helps hemorrhoid problems.  Backaches may develop during this time of your pregnancy. Avoid heavy lifting, wear low heal shoes, and practice good posture to help with backache problems.  Some pregnant women develop tingling and numbness of their hand and fingers because of swelling and tightening of ligaments in the wrist (carpel tunnel syndrome). This goes away after the baby is born.  As your breasts enlarge, you may have to get a bigger bra. Get a comfortable, cotton, support bra. Do not get a nursing bra until the last month of the pregnancy if you will be nursing the baby.  You may get a dark line from your belly button to the pubic area called the linea nigra.  You may develop rosy cheeks because of increase blood flow to the face.  You may develop spider looking lines of the face, neck, arms, and chest. These go away after the baby is born. HOME CARE INSTRUCTIONS   It is extremely important to avoid all smoking, herbs, alcohol, and unprescribed drugs during your pregnancy. These chemicals affect the formation and growth of the baby. Avoid these chemicals throughout the pregnancy to ensure the delivery of a healthy infant.  Most of your home care instructions are the same as suggested for the first trimester of your  pregnancy. Keep your caregiver's appointments. Follow your caregiver's instructions regarding medicine use, exercise, and diet.  During pregnancy, you are providing food for you and your baby. Continue to eat regular, well-balanced meals. Choose foods such as meat, fish, milk and other low fat dairy products, vegetables, fruits, and whole-grain breads and cereals. Your caregiver will tell you of the ideal weight gain.  A physical sexual relationship may be continued up until near the end of pregnancy if there are no other problems. Problems could include early (premature) leaking of amniotic fluid from the membranes, vaginal bleeding, abdominal pain, or other medical or pregnancy problems.  Exercise regularly if there are no restrictions. Check with your caregiver if you are unsure of the safety of some of your exercises. The greatest weight gain will occur in the last 2 trimesters of pregnancy. Exercise will help you:  Control your weight.  Get you in shape for labor and delivery.  Lose weight after you have the baby.  Wear   a good support or jogging bra for breast tenderness during pregnancy. This may help if worn during sleep. Pads or tissues may be used in the bra if you are leaking colostrum.  Do not use hot tubs, steam rooms or saunas throughout the pregnancy.  Wear your seat belt at all times when driving. This protects you and your baby if you are in an accident.  Avoid raw meat, uncooked cheese, cat litter boxes, and soil used by cats. These carry germs that can cause birth defects in the baby.  The second trimester is also a good time to visit your dentist for your dental health if this has not been done yet. Getting your teeth cleaned is okay. Use a soft toothbrush. Brush gently during pregnancy.  It is easier to leak urine during pregnancy. Tightening up and strengthening the pelvic muscles will help with this problem. Practice stopping your urination while you are going to the  bathroom. These are the same muscles you need to strengthen. It is also the muscles you would use as if you were trying to stop from passing gas. You can practice tightening these muscles up 10 times a set and repeating this about 3 times per day. Once you know what muscles to tighten up, do not perform these exercises during urination. It is more likely to contribute to an infection by backing up the urine.  Ask for help if you have financial, counseling, or nutritional needs during pregnancy. Your caregiver will be able to offer counseling for these needs as well as refer you for other special needs.  Your skin may become oily. If so, wash your face with mild soap, use non-greasy moisturizer and oil or cream based makeup. MEDICINES AND DRUG USE IN PREGNANCY  Take prenatal vitamins as directed. The vitamin should contain 1 milligram of folic acid. Keep all vitamins out of reach of children. Only a couple vitamins or tablets containing iron may be fatal to a baby or young child when ingested.  Avoid use of all medicines, including herbs, over-the-counter medicines, not prescribed or suggested by your caregiver. Only take over-the-counter or prescription medicines for pain, discomfort, or fever as directed by your caregiver. Do not use aspirin.  Let your caregiver also know about herbs you may be using.  Alcohol is related to a number of birth defects. This includes fetal alcohol syndrome. All alcohol, in any form, should be avoided completely. Smoking will cause low birth rate and premature babies.  Street or illegal drugs are very harmful to the baby. They are absolutely forbidden. A baby born to an addicted mother will be addicted at birth. The baby will go through the same withdrawal an adult does. SEEK MEDICAL CARE IF:  You have any concerns or worries during your pregnancy. It is better to call with your questions if you feel they cannot wait, rather than worry about them. SEEK IMMEDIATE  MEDICAL CARE IF:   An unexplained oral temperature above 102 F (38.9 C) develops, or as your caregiver suggests.  You have leaking of fluid from the vagina (birth canal). If leaking membranes are suspected, take your temperature and tell your caregiver of this when you call.  There is vaginal spotting, bleeding, or passing clots. Tell your caregiver of the amount and how many pads are used. Light spotting in pregnancy is common, especially following intercourse.  You develop a bad smelling vaginal discharge with a change in the color from clear to white.  You continue to feel   sick to your stomach (nauseated) and have no relief from remedies suggested. You vomit blood or coffee ground-like materials.  You lose more than 2 pounds of weight or gain more than 2 pounds of weight over 1 week, or as suggested by your caregiver.  You notice swelling of your face, hands, feet, or legs.  You get exposed to German measles and have never had them.  You are exposed to fifth disease or chickenpox.  You develop belly (abdominal) pain. Round ligament discomfort is a common non-cancerous (benign) cause of abdominal pain in pregnancy. Your caregiver still must evaluate you.  You develop a bad headache that does not go away.  You develop fever, diarrhea, pain with urination, or shortness of breath.  You develop visual problems, blurry, or double vision.  You fall or are in a car accident or any kind of trauma.  There is mental or physical violence at home. Document Released: 09/13/2001 Document Revised: 06/13/2012 Document Reviewed: 03/18/2009 [employer] Patient Information 2014 [employer].  

## 2013-03-31 LAB — MATERNAL SCREEN, INTEGRATED #2
AFP MoM: 1.12
Crown Rump Length: 62 mm
Estriol Mom: 1.52
Estriol, Free: 0.68 ng/mL
MSS Trisomy 18 Risk: <1:5000 {titer}
Nuchal Translucency: 2.23 mm
PAPP-A MoM: 0.88
hCG, Serum: 17.1 IU/mL

## 2013-04-10 ENCOUNTER — Telehealth: Payer: Self-pay | Admitting: Obstetrics & Gynecology

## 2013-04-10 NOTE — Telephone Encounter (Signed)
Spoke with pt. Wanted results of ITNT. Results were normal. Pt aware. JSY

## 2013-04-25 ENCOUNTER — Encounter: Payer: Self-pay | Admitting: Advanced Practice Midwife

## 2013-04-25 ENCOUNTER — Ambulatory Visit (INDEPENDENT_AMBULATORY_CARE_PROVIDER_SITE_OTHER): Payer: Medicaid Other | Admitting: Advanced Practice Midwife

## 2013-04-25 ENCOUNTER — Ambulatory Visit (INDEPENDENT_AMBULATORY_CARE_PROVIDER_SITE_OTHER): Payer: BC Managed Care – PPO

## 2013-04-25 ENCOUNTER — Other Ambulatory Visit: Payer: Self-pay | Admitting: Obstetrics & Gynecology

## 2013-04-25 VITALS — BP 100/60 | Wt 244.0 lb

## 2013-04-25 DIAGNOSIS — Z3481 Encounter for supervision of other normal pregnancy, first trimester: Secondary | ICD-10-CM

## 2013-04-25 DIAGNOSIS — Z1389 Encounter for screening for other disorder: Secondary | ICD-10-CM

## 2013-04-25 DIAGNOSIS — O99019 Anemia complicating pregnancy, unspecified trimester: Secondary | ICD-10-CM

## 2013-04-25 DIAGNOSIS — Z331 Pregnant state, incidental: Secondary | ICD-10-CM

## 2013-04-25 LAB — POCT URINALYSIS DIPSTICK
Glucose, UA: NEGATIVE
Nitrite, UA: NEGATIVE

## 2013-04-25 NOTE — Progress Notes (Signed)
U/S(20+3wks)-active fetus, meas c/w dates, fluid wnl, no obvious major abnl noted, cx long and closed, bilateral adnexa wnl, post gr 0 plac, female fetus

## 2013-04-25 NOTE — Progress Notes (Signed)
Had u/s today.   No c/o at this time. Eats small meals TID-QID. May try protein supplement.  Has been crampy for a few months.  Stay hydrated. Routine questions about pregnancy answered.  F/U in 4 weeks for LROB.

## 2013-05-23 ENCOUNTER — Encounter: Payer: BC Managed Care – PPO | Admitting: Advanced Practice Midwife

## 2013-05-29 ENCOUNTER — Encounter: Payer: Self-pay | Admitting: Advanced Practice Midwife

## 2013-05-29 ENCOUNTER — Ambulatory Visit (INDEPENDENT_AMBULATORY_CARE_PROVIDER_SITE_OTHER): Payer: Medicaid Other | Admitting: Advanced Practice Midwife

## 2013-05-29 VITALS — BP 100/70 | Wt 246.0 lb

## 2013-05-29 DIAGNOSIS — Z331 Pregnant state, incidental: Secondary | ICD-10-CM

## 2013-05-29 DIAGNOSIS — O99019 Anemia complicating pregnancy, unspecified trimester: Secondary | ICD-10-CM

## 2013-05-29 DIAGNOSIS — Z1389 Encounter for screening for other disorder: Secondary | ICD-10-CM

## 2013-05-29 LAB — POCT URINALYSIS DIPSTICK
Leukocytes, UA: NEGATIVE
Nitrite, UA: NEGATIVE
Protein, UA: NEGATIVE

## 2013-05-29 NOTE — Patient Instructions (Signed)
Nothing to eat or drink after midnight before your next appointment & plan to be here for 2 hours (for your sugar test).  

## 2013-05-29 NOTE — Progress Notes (Signed)
Appetite better. Pain and numbness in right leg.  Stretches/excercises recommended.  Routine questions about pregnancy answered.  F/U in 2 weeks for PN2/LROB.

## 2013-05-29 NOTE — Progress Notes (Signed)
C/C Pain and numbness in RT. x1 .

## 2013-06-06 ENCOUNTER — Telehealth: Payer: Self-pay | Admitting: Obstetrics and Gynecology

## 2013-06-06 NOTE — Telephone Encounter (Signed)
Left message x 1. JSY 

## 2013-06-07 ENCOUNTER — Other Ambulatory Visit: Payer: Self-pay | Admitting: Women's Health

## 2013-06-07 DIAGNOSIS — G43909 Migraine, unspecified, not intractable, without status migrainosus: Secondary | ICD-10-CM

## 2013-06-07 MED ORDER — BUTALBITAL-APAP-CAFFEINE 50-325-40 MG PO TABS: 1.0000 | ORAL_TABLET | ORAL | Status: DC | PRN

## 2013-06-07 NOTE — Telephone Encounter (Signed)
Spoke with pt. Has a history of migraines. Had a bad migraine yesterday. Took 3 extra strength Tylenol throughout the day but no relief. BP was fine yesterday. Has been on Fioricet before for headaches but has ran out. Can you order more? Pt not sleeping good. Advised Benadryl was safe to take with drowsy precautions. Uses [employer]. Thanks!!!!

## 2013-06-07 NOTE — Telephone Encounter (Signed)
Spoke with Joellyn Haff, CNM and she refilled Fioricet. Also advised Benadryl was safe with drowsy precautions. JSY

## 2013-06-07 NOTE — Telephone Encounter (Signed)
It is fine to take them both to sleep.  No problems

## 2013-06-12 ENCOUNTER — Ambulatory Visit (INDEPENDENT_AMBULATORY_CARE_PROVIDER_SITE_OTHER): Payer: Medicaid Other | Admitting: Advanced Practice Midwife

## 2013-06-12 ENCOUNTER — Encounter: Payer: Self-pay | Admitting: Advanced Practice Midwife

## 2013-06-12 ENCOUNTER — Other Ambulatory Visit: Payer: BC Managed Care – PPO

## 2013-06-12 VITALS — BP 108/70 | Wt 241.2 lb

## 2013-06-12 DIAGNOSIS — Z3483 Encounter for supervision of other normal pregnancy, third trimester: Secondary | ICD-10-CM

## 2013-06-12 DIAGNOSIS — Z331 Pregnant state, incidental: Secondary | ICD-10-CM

## 2013-06-12 DIAGNOSIS — Z1389 Encounter for screening for other disorder: Secondary | ICD-10-CM

## 2013-06-12 DIAGNOSIS — O99019 Anemia complicating pregnancy, unspecified trimester: Secondary | ICD-10-CM

## 2013-06-12 LAB — CBC
HCT: 31.6 % — ABNORMAL LOW (ref 36.0–46.0)
Hemoglobin: 11 g/dL — ABNORMAL LOW (ref 12.0–15.0)
MCH: 28.6 pg (ref 26.0–34.0)
MCHC: 34.8 g/dL (ref 30.0–36.0)
MCV: 82.3 fL (ref 78.0–100.0)
Platelets: 232 K/uL (ref 150–400)
RBC: 3.84 MIL/uL — ABNORMAL LOW (ref 3.87–5.11)
RDW: 13.7 % (ref 11.5–15.5)
WBC: 7.1 K/uL (ref 4.0–10.5)

## 2013-06-12 LAB — POCT URINALYSIS DIPSTICK
Blood, UA: NEGATIVE
Glucose, UA: NEGATIVE
Ketones, UA: NEGATIVE
Nitrite, UA: NEGATIVE
Protein, UA: NEGATIVE

## 2013-06-12 NOTE — Progress Notes (Addendum)
Having some cramping 2-3 times a day "the whole pregnancy".  Discussed hydration.  Doing PN2 today.  Not much of an appetite.  Try powdered supplements, small frequent meals.  Routine questions about pregnancy answered.  F/U in 4 weeks for LROB.

## 2013-06-13 ENCOUNTER — Encounter: Payer: BC Managed Care – PPO | Admitting: Advanced Practice Midwife

## 2013-06-13 LAB — GLUCOSE TOLERANCE, 2 HOURS W/ 1HR
Glucose, 1 hour: 82 mg/dL (ref 70–170)
Glucose, 2 hour: 83 mg/dL (ref 70–139)
Glucose, Fasting: 73 mg/dL (ref 70–99)

## 2013-06-13 LAB — ANTIBODY SCREEN: Antibody Screen: NEGATIVE

## 2013-06-13 LAB — HSV 2 ANTIBODY, IGG: HSV 2 Glycoprotein G Ab, IgG: 0.12 IV

## 2013-06-13 LAB — HIV ANTIBODY (ROUTINE TESTING W REFLEX): HIV: NONREACTIVE

## 2013-06-13 LAB — RPR

## 2013-06-19 ENCOUNTER — Telehealth: Payer: Self-pay | Admitting: Adult Health

## 2013-06-24 ENCOUNTER — Telehealth: Payer: Self-pay | Admitting: Women's Health

## 2013-06-24 NOTE — Telephone Encounter (Signed)
Spoke with pt. Has had a headache all weekend. Taking Fioricet. It usually helps, but not this time. BP was checked earlier today and it was 96/40's. She felt lightheaded. Most recent BP check was 103/62. Pulse 91. She don't feel lightheaded now. Spoke with Cyril Mourning, NP. She advised to have pt be seen tomorrow. The girls had left the front desk, so pt was instructed to call tomorrow at 8:30am and schedule an appt. Pt voiced understanding. JSY

## 2013-06-25 ENCOUNTER — Encounter: Payer: Self-pay | Admitting: Obstetrics & Gynecology

## 2013-06-25 ENCOUNTER — Ambulatory Visit (INDEPENDENT_AMBULATORY_CARE_PROVIDER_SITE_OTHER): Payer: Medicaid Other | Admitting: Obstetrics & Gynecology

## 2013-06-25 VITALS — BP 108/60 | Wt 241.0 lb

## 2013-06-25 DIAGNOSIS — Z1389 Encounter for screening for other disorder: Secondary | ICD-10-CM

## 2013-06-25 DIAGNOSIS — Z331 Pregnant state, incidental: Secondary | ICD-10-CM

## 2013-06-25 DIAGNOSIS — O99019 Anemia complicating pregnancy, unspecified trimester: Secondary | ICD-10-CM

## 2013-06-25 LAB — POCT URINALYSIS DIPSTICK
Blood, UA: NEGATIVE
Ketones, UA: NEGATIVE
Leukocytes, UA: NEGATIVE
Protein, UA: NEGATIVE

## 2013-06-25 NOTE — Progress Notes (Signed)
Pt with history of vascular spasm headache for which she used to take maxalt.Headahe is over/around the left eye.  Responded completely to oxygen therapy. Will order for home use to use 3 times a day for 20 minutes prn. BP weight and urine results all reviewed and noted. Patient reports good fetal movement, denies any bleeding and no rupture of membranes symptoms or regular contractions. Patient is without complaints. All questions were answered.

## 2013-06-27 NOTE — Telephone Encounter (Signed)
Pt informed of WNL Glucose tolerance test from 06/12/2013.

## 2013-07-10 ENCOUNTER — Encounter: Payer: Self-pay | Admitting: Advanced Practice Midwife

## 2013-07-10 ENCOUNTER — Encounter (INDEPENDENT_AMBULATORY_CARE_PROVIDER_SITE_OTHER): Payer: Self-pay

## 2013-07-10 ENCOUNTER — Ambulatory Visit (INDEPENDENT_AMBULATORY_CARE_PROVIDER_SITE_OTHER): Payer: Medicaid Other | Admitting: Advanced Practice Midwife

## 2013-07-10 VITALS — BP 110/60 | Wt 244.5 lb

## 2013-07-10 DIAGNOSIS — O99019 Anemia complicating pregnancy, unspecified trimester: Secondary | ICD-10-CM

## 2013-07-10 DIAGNOSIS — Z331 Pregnant state, incidental: Secondary | ICD-10-CM

## 2013-07-10 DIAGNOSIS — Z1389 Encounter for screening for other disorder: Secondary | ICD-10-CM

## 2013-07-10 DIAGNOSIS — Z3483 Encounter for supervision of other normal pregnancy, third trimester: Secondary | ICD-10-CM

## 2013-07-10 DIAGNOSIS — Z23 Encounter for immunization: Secondary | ICD-10-CM

## 2013-07-10 LAB — POCT URINALYSIS DIPSTICK
Blood, UA: NEGATIVE
Glucose, UA: NEGATIVE
Ketones, UA: NEGATIVE
Nitrite, UA: NEGATIVE

## 2013-07-10 MED ORDER — INFLUENZA VAC SPLIT QUAD 0.5 ML IM SUSP
0.5000 mL | Freq: Once | INTRAMUSCULAR | Status: AC
  Administered 2013-07-10: 0.5 mL via INTRAMUSCULAR

## 2013-07-10 NOTE — Progress Notes (Signed)
No c/o at this time. 02 therapy has helped HA's.  Routtine questions about pregnancy answered.  F/U in 2 weeks for LROB.

## 2013-07-22 ENCOUNTER — Telehealth: Payer: Self-pay | Admitting: Obstetrics and Gynecology

## 2013-07-22 NOTE — Telephone Encounter (Signed)
Pt states having a mucus, white to clear discharge, no itching or irritation, no vaginal bleeding, + FM. Informed pt WNL to have a white discharge with mucus, but to monitor for odor, irritation, itching, etc. Pt verbalized understanding and has an appt on Wednesday of this week.

## 2013-07-24 ENCOUNTER — Encounter (INDEPENDENT_AMBULATORY_CARE_PROVIDER_SITE_OTHER): Payer: Self-pay

## 2013-07-24 ENCOUNTER — Ambulatory Visit (INDEPENDENT_AMBULATORY_CARE_PROVIDER_SITE_OTHER): Payer: Medicaid Other | Admitting: Obstetrics & Gynecology

## 2013-07-24 ENCOUNTER — Encounter: Payer: Self-pay | Admitting: Obstetrics & Gynecology

## 2013-07-24 VITALS — BP 100/60 | Wt 238.0 lb

## 2013-07-24 DIAGNOSIS — O99019 Anemia complicating pregnancy, unspecified trimester: Secondary | ICD-10-CM

## 2013-07-24 DIAGNOSIS — Z1389 Encounter for screening for other disorder: Secondary | ICD-10-CM

## 2013-07-24 DIAGNOSIS — Z3483 Encounter for supervision of other normal pregnancy, third trimester: Secondary | ICD-10-CM

## 2013-07-24 DIAGNOSIS — Z331 Pregnant state, incidental: Secondary | ICD-10-CM

## 2013-07-24 DIAGNOSIS — O2390 Unspecified genitourinary tract infection in pregnancy, unspecified trimester: Secondary | ICD-10-CM

## 2013-07-24 LAB — POCT URINALYSIS DIPSTICK
Blood, UA: NEGATIVE
Glucose, UA: NEGATIVE

## 2013-07-24 MED ORDER — OMEPRAZOLE 20 MG PO CPDR: 20.0000 mg | DELAYED_RELEASE_CAPSULE | Freq: Every day | ORAL | Status: DC

## 2013-07-24 NOTE — Progress Notes (Signed)
BP weight and urine results all reviewed and noted. Patient reports good fetal movement, denies any bleeding and no rupture of membranes symptoms or regular contractions. Patient is without complaints. All questions were answered.  

## 2013-07-31 ENCOUNTER — Telehealth: Payer: Self-pay | Admitting: Adult Health

## 2013-07-31 NOTE — Telephone Encounter (Signed)
C/o scratchy sore throat , nose "stopped up." Pt informed can take plain OTC robitussin, tylenol, gargle warm salt water, lozenges if no improvement pt to call office back.

## 2013-08-07 ENCOUNTER — Encounter: Payer: Self-pay | Admitting: Advanced Practice Midwife

## 2013-08-07 ENCOUNTER — Encounter (INDEPENDENT_AMBULATORY_CARE_PROVIDER_SITE_OTHER): Payer: Self-pay

## 2013-08-07 ENCOUNTER — Ambulatory Visit (INDEPENDENT_AMBULATORY_CARE_PROVIDER_SITE_OTHER): Payer: Medicaid Other | Admitting: Advanced Practice Midwife

## 2013-08-07 VITALS — BP 116/68 | Wt 239.0 lb

## 2013-08-07 DIAGNOSIS — Z331 Pregnant state, incidental: Secondary | ICD-10-CM

## 2013-08-07 DIAGNOSIS — Z1389 Encounter for screening for other disorder: Secondary | ICD-10-CM

## 2013-08-07 DIAGNOSIS — O26843 Uterine size-date discrepancy, third trimester: Secondary | ICD-10-CM

## 2013-08-07 DIAGNOSIS — O99019 Anemia complicating pregnancy, unspecified trimester: Secondary | ICD-10-CM

## 2013-08-07 DIAGNOSIS — O26849 Uterine size-date discrepancy, unspecified trimester: Secondary | ICD-10-CM

## 2013-08-07 LAB — POCT URINALYSIS DIPSTICK
Glucose, UA: NEGATIVE
Ketones, UA: NEGATIVE
Leukocytes, UA: NEGATIVE
Protein, UA: NEGATIVE

## 2013-08-07 NOTE — Progress Notes (Signed)
No c/o at this time.  Has "a lot" of tightening some days.  Sounds like BH, advised hydration. .Size <dates, but fundus is unusually shaped. Will get EFW/AFI.  Routine questions about pregnancy answered.  F/U in 1 weeks for LROB.

## 2013-08-12 ENCOUNTER — Other Ambulatory Visit: Payer: Self-pay | Admitting: *Deleted

## 2013-08-12 DIAGNOSIS — Z3483 Encounter for supervision of other normal pregnancy, third trimester: Secondary | ICD-10-CM

## 2013-08-12 DIAGNOSIS — G43909 Migraine, unspecified, not intractable, without status migrainosus: Secondary | ICD-10-CM

## 2013-08-13 MED ORDER — BUTALBITAL-APAP-CAFFEINE 50-325-40 MG PO TABS: 1.0000 | ORAL_TABLET | ORAL | Status: DC | PRN

## 2013-08-14 ENCOUNTER — Ambulatory Visit (INDEPENDENT_AMBULATORY_CARE_PROVIDER_SITE_OTHER): Payer: Medicaid Other

## 2013-08-14 ENCOUNTER — Ambulatory Visit (INDEPENDENT_AMBULATORY_CARE_PROVIDER_SITE_OTHER): Payer: Medicaid Other | Admitting: Advanced Practice Midwife

## 2013-08-14 ENCOUNTER — Encounter: Payer: Self-pay | Admitting: Advanced Practice Midwife

## 2013-08-14 VITALS — BP 120/80 | Wt 238.0 lb

## 2013-08-14 DIAGNOSIS — O99019 Anemia complicating pregnancy, unspecified trimester: Secondary | ICD-10-CM

## 2013-08-14 DIAGNOSIS — O26849 Uterine size-date discrepancy, unspecified trimester: Secondary | ICD-10-CM

## 2013-08-14 DIAGNOSIS — Z331 Pregnant state, incidental: Secondary | ICD-10-CM

## 2013-08-14 DIAGNOSIS — O26843 Uterine size-date discrepancy, third trimester: Secondary | ICD-10-CM

## 2013-08-14 DIAGNOSIS — Z1389 Encounter for screening for other disorder: Secondary | ICD-10-CM

## 2013-08-14 DIAGNOSIS — Z3483 Encounter for supervision of other normal pregnancy, third trimester: Secondary | ICD-10-CM

## 2013-08-14 LAB — POCT URINALYSIS DIPSTICK
Leukocytes, UA: NEGATIVE
Nitrite, UA: NEGATIVE
Protein, UA: NEGATIVE

## 2013-08-14 NOTE — Progress Notes (Signed)
U/S(36+2wks)-vtx active fetus, EFW 5 lb 11 oz (24th%tile), fluid WNL, AFI-10.7cm, posterior Gr 1 placenta, female fetus

## 2013-08-14 NOTE — Progress Notes (Signed)
Ultrasound today for size <dates. See ultrasound results below. Denies VB, LOF, and contractions.  No problems or concerns at this time. All questions answered. FU in 1 wk for LROB

## 2013-08-14 NOTE — Addendum Note (Signed)
Addended by: Colen Darling on: 08/14/2013 10:33 AM   Modules accepted: Orders

## 2013-08-21 ENCOUNTER — Encounter: Payer: Self-pay | Admitting: Advanced Practice Midwife

## 2013-08-21 ENCOUNTER — Ambulatory Visit (INDEPENDENT_AMBULATORY_CARE_PROVIDER_SITE_OTHER): Payer: Medicaid Other | Admitting: Advanced Practice Midwife

## 2013-08-21 VITALS — BP 100/80 | Wt 239.0 lb

## 2013-08-21 DIAGNOSIS — O99019 Anemia complicating pregnancy, unspecified trimester: Secondary | ICD-10-CM

## 2013-08-21 DIAGNOSIS — Z1389 Encounter for screening for other disorder: Secondary | ICD-10-CM

## 2013-08-21 DIAGNOSIS — Z331 Pregnant state, incidental: Secondary | ICD-10-CM

## 2013-08-21 DIAGNOSIS — Z3483 Encounter for supervision of other normal pregnancy, third trimester: Secondary | ICD-10-CM

## 2013-08-21 LAB — POCT URINALYSIS DIPSTICK
Blood, UA: NEGATIVE
Ketones, UA: NEGATIVE
Protein, UA: NEGATIVE

## 2013-08-21 NOTE — Progress Notes (Signed)
Having tightening 5-6 times a day. Denies LOF and VB.  GBS collected. Labor signs reviewed. All questions answered. F/U in 1 weeks LROB.

## 2013-08-21 NOTE — Addendum Note (Signed)
Addended by: Jacklyn Shell on: 08/21/2013 02:10 PM   Modules accepted: Orders

## 2013-08-21 NOTE — Addendum Note (Signed)
Addended by: Criss Alvine on: 08/21/2013 11:55 AM   Modules accepted: Orders

## 2013-08-22 LAB — GC/CHLAMYDIA PROBE AMP: CT Probe RNA: NEGATIVE

## 2013-08-23 LAB — STREP B DNA PROBE: GBSP: POSITIVE

## 2013-08-27 ENCOUNTER — Encounter: Payer: Self-pay | Admitting: Women's Health

## 2013-08-27 ENCOUNTER — Ambulatory Visit (INDEPENDENT_AMBULATORY_CARE_PROVIDER_SITE_OTHER): Payer: Medicaid Other | Admitting: Women's Health

## 2013-08-27 VITALS — BP 118/58 | Wt 244.0 lb

## 2013-08-27 DIAGNOSIS — Z331 Pregnant state, incidental: Secondary | ICD-10-CM

## 2013-08-27 DIAGNOSIS — Z029 Encounter for administrative examinations, unspecified: Secondary | ICD-10-CM

## 2013-08-27 DIAGNOSIS — Z3483 Encounter for supervision of other normal pregnancy, third trimester: Secondary | ICD-10-CM

## 2013-08-27 DIAGNOSIS — Z1389 Encounter for screening for other disorder: Secondary | ICD-10-CM

## 2013-08-27 DIAGNOSIS — O99891 Other specified diseases and conditions complicating pregnancy: Secondary | ICD-10-CM

## 2013-08-27 DIAGNOSIS — O99019 Anemia complicating pregnancy, unspecified trimester: Secondary | ICD-10-CM

## 2013-08-27 LAB — POCT URINALYSIS DIPSTICK
Glucose, UA: NEGATIVE
Ketones, UA: NEGATIVE
Nitrite, UA: NEGATIVE

## 2013-08-27 NOTE — Progress Notes (Signed)
Reports good fm. Denies uc's, lof, vb, urinary frequency, urgency, hesitancy, or dysuria.  Some BH. Some LBP that started last night. Work note given to begin maternity leave 12/1 per her request. Reviewed LBP relief measures, labor s/s, fkc.  Requests SVE today.  All questions answered. F/U in 1wk for visit.

## 2013-08-27 NOTE — Patient Instructions (Signed)
Call the office or go to [facility] if:  You begin to have strong, frequent contractions  Your water breaks.  Sometimes it is a big gush of fluid, sometimes it is just a trickle that keeps getting your panties wet or running down your legs  You have vaginal bleeding.  It is normal to have a small amount of spotting if your cervix was checked.   You don't feel your baby moving like normal.  If you don't, get you something to eat and drink and lay down and focus on feeling your baby move.  You should feel at least 10 movements in 2 hours.  If you don't, you should call the office or go to [facility].   Braxton Hicks Contractions Pregnancy is commonly associated with contractions of the uterus throughout the pregnancy. Towards the end of pregnancy (32 to 34 weeks), these contractions (Braxton Hicks) can develop more often and may become more forceful. This is not true labor because these contractions do not result in opening (dilatation) and thinning of the cervix. They are sometimes difficult to tell apart from true labor because these contractions can be forceful and people have different pain tolerances. You should not feel embarrassed if you go to the hospital with false labor. Sometimes, the only way to tell if you are in true labor is for your caregiver to follow the changes in the cervix. How to tell the difference between true and false labor:  False labor.  The contractions of false labor are usually shorter, irregular and not as hard as those of true labor.  They are often felt in the front of the lower abdomen and in the groin.  They may leave with walking around or changing positions while lying down.  They get weaker and are shorter lasting as time goes on.  These contractions are usually irregular.  They do not usually become progressively stronger, regular and closer together as with true labor.  True labor.  Contractions in true labor last 30 to 70 seconds,  become very regular, usually become more intense, and increase in frequency.  They do not go away with walking.  The discomfort is usually felt in the top of the uterus and spreads to the lower abdomen and low back.  True labor can be determined by your caregiver with an exam. This will show that the cervix is dilating and getting thinner. If there are no prenatal problems or other health problems associated with the pregnancy, it is completely safe to be sent home with false labor and await the onset of true labor. HOME CARE INSTRUCTIONS   Keep up with your usual exercises and instructions.  Take medications as directed.  Keep your regular prenatal appointment.  Eat and drink lightly if you think you are going into labor.  If BH contractions are making you uncomfortable:  Change your activity position from lying down or resting to walking/walking to resting.  Sit and rest in a tub of warm water.  Drink 2 to 3 glasses of water. Dehydration may cause B-H contractions.  Do slow and deep breathing several times an hour. SEEK IMMEDIATE MEDICAL CARE IF:   Your contractions continue to become stronger, more regular, and closer together.  You have a gushing, burst or leaking of fluid from the vagina.  An oral temperature above 102 F (38.9 C) develops.  You have passage of blood-tinged mucus.  You develop vaginal bleeding.  You develop continuous belly (abdominal) pain.  You have   low back pain that you never had before.  You feel the baby's head pushing down causing pelvic pressure.  The baby is not moving as much as it used to. Document Released: 09/19/2005 Document Revised: 12/12/2011 Document Reviewed: 03/13/2009 ExitCare Patient Information 2014 [employer].  

## 2013-08-28 ENCOUNTER — Inpatient Hospital Stay (HOSPITAL_COMMUNITY): Payer: Medicaid Other | Admitting: Anesthesiology

## 2013-08-28 ENCOUNTER — Encounter (HOSPITAL_COMMUNITY): Payer: Self-pay | Admitting: *Deleted

## 2013-08-28 ENCOUNTER — Encounter (HOSPITAL_COMMUNITY): Payer: Medicaid Other | Admitting: Anesthesiology

## 2013-08-28 ENCOUNTER — Inpatient Hospital Stay (HOSPITAL_COMMUNITY)
Admission: AD | Admit: 2013-08-28 | Discharge: 2013-08-29 | DRG: 775 | Disposition: A | Discharge status: Home / Self Care | Payer: Medicaid Other | Source: Ambulatory Visit | Attending: Family Medicine | Admitting: Family Medicine

## 2013-08-28 DIAGNOSIS — O99892 Other specified diseases and conditions complicating childbirth: Secondary | ICD-10-CM | POA: Diagnosis present

## 2013-08-28 DIAGNOSIS — IMO0001 Reserved for inherently not codable concepts without codable children: Secondary | ICD-10-CM

## 2013-08-28 DIAGNOSIS — Z3483 Encounter for supervision of other normal pregnancy, third trimester: Secondary | ICD-10-CM

## 2013-08-28 DIAGNOSIS — O99344 Other mental disorders complicating childbirth: Secondary | ICD-10-CM

## 2013-08-28 DIAGNOSIS — F1210 Cannabis abuse, uncomplicated: Secondary | ICD-10-CM

## 2013-08-28 DIAGNOSIS — Z22330 Carrier of Group B streptococcus: Secondary | ICD-10-CM

## 2013-08-28 DIAGNOSIS — O34219 Maternal care for unspecified type scar from previous cesarean delivery: Secondary | ICD-10-CM

## 2013-08-28 LAB — CBC
Hemoglobin: 11.5 g/dL — ABNORMAL LOW (ref 12.0–15.0)
MCHC: 34.1 g/dL (ref 30.0–36.0)
MCV: 81.4 fL (ref 78.0–100.0)
Platelets: 200 10*3/uL (ref 150–400)
RBC: 4.14 MIL/uL (ref 3.87–5.11)
WBC: 10 10*3/uL (ref 4.0–10.5)

## 2013-08-28 LAB — RPR: RPR Ser Ql: NONREACTIVE

## 2013-08-28 MED ORDER — LANOLIN HYDROUS EX OINT: TOPICAL_OINTMENT | CUTANEOUS | Status: DC | PRN

## 2013-08-28 MED ORDER — PHENYLEPHRINE 40 MCG/ML (10ML) SYRINGE FOR IV PUSH (FOR BLOOD PRESSURE SUPPORT)
80.0000 ug | PREFILLED_SYRINGE | INTRAVENOUS | Status: DC | PRN
  Filled 2013-08-28: qty 2
  Filled 2013-08-28: qty 10

## 2013-08-28 MED ORDER — ONDANSETRON HCL 4 MG/2ML IJ SOLN: 4.0000 mg | INTRAMUSCULAR | Status: DC | PRN

## 2013-08-28 MED ORDER — DEXTROSE 5 % IV SOLN
5.0000 10*6.[IU] | Freq: Once | INTRAVENOUS | Status: AC
  Administered 2013-08-28: 5 10*6.[IU] via INTRAVENOUS
  Filled 2013-08-28: qty 5

## 2013-08-28 MED ORDER — LACTATED RINGERS IV SOLN
INTRAVENOUS | Status: DC
  Administered 2013-08-28: 125 mL/h via INTRAVENOUS

## 2013-08-28 MED ORDER — MISOPROSTOL 200 MCG PO TABS: 800.0000 ug | ORAL_TABLET | Freq: Once | ORAL | Status: DC

## 2013-08-28 MED ORDER — LIDOCAINE HCL (PF) 1 % IJ SOLN
INTRAMUSCULAR | Status: DC | PRN
  Administered 2013-08-28 (×2): 4 mL

## 2013-08-28 MED ORDER — LACTATED RINGERS IV BOLUS (SEPSIS): 1000.0000 mL | Freq: Once | INTRAVENOUS | Status: DC

## 2013-08-28 MED ORDER — WITCH HAZEL-GLYCERIN EX PADS: 1.0000 "application " | MEDICATED_PAD | CUTANEOUS | Status: DC | PRN

## 2013-08-28 MED ORDER — FLEET ENEMA 7-19 GM/118ML RE ENEM: 1.0000 | ENEMA | RECTAL | Status: DC | PRN

## 2013-08-28 MED ORDER — FERROUS SULFATE 325 (65 FE) MG PO TABS
325.0000 mg | ORAL_TABLET | Freq: Two times a day (BID) | ORAL | Status: DC
  Administered 2013-08-28 – 2013-08-29 (×3): 325 mg via ORAL
  Filled 2013-08-28 (×3): qty 1

## 2013-08-28 MED ORDER — FENTANYL 2.5 MCG/ML BUPIVACAINE 1/10 % EPIDURAL INFUSION (WH - ANES)
INTRAMUSCULAR | Status: DC | PRN
  Administered 2013-08-28: 14 mL/h via EPIDURAL

## 2013-08-28 MED ORDER — NALBUPHINE SYRINGE 5 MG/0.5 ML
10.0000 mg | INJECTION | Freq: Once | INTRAMUSCULAR | Status: AC
  Administered 2013-08-28: 10 mg via INTRAMUSCULAR
  Filled 2013-08-28: qty 1

## 2013-08-28 MED ORDER — FENTANYL 2.5 MCG/ML BUPIVACAINE 1/10 % EPIDURAL INFUSION (WH - ANES)
14.0000 mL/h | INTRAMUSCULAR | Status: DC | PRN
  Filled 2013-08-28: qty 125

## 2013-08-28 MED ORDER — BENZOCAINE-MENTHOL 20-0.5 % EX AERO: 1.0000 "application " | INHALATION_SPRAY | CUTANEOUS | Status: DC | PRN

## 2013-08-28 MED ORDER — ZOLPIDEM TARTRATE 5 MG PO TABS: 5.0000 mg | ORAL_TABLET | Freq: Every evening | ORAL | Status: DC | PRN

## 2013-08-28 MED ORDER — PENICILLIN G POTASSIUM 5000000 UNITS IJ SOLR
2.5000 10*6.[IU] | INTRAVENOUS | Status: DC
  Filled 2013-08-28 (×3): qty 2.5

## 2013-08-28 MED ORDER — SENNOSIDES-DOCUSATE SODIUM 8.6-50 MG PO TABS
2.0000 | ORAL_TABLET | ORAL | Status: DC
  Filled 2013-08-28: qty 2

## 2013-08-28 MED ORDER — TETANUS-DIPHTH-ACELL PERTUSSIS 5-2.5-18.5 LF-MCG/0.5 IM SUSP: 0.5000 mL | Freq: Once | INTRAMUSCULAR | Status: DC

## 2013-08-28 MED ORDER — OXYCODONE-ACETAMINOPHEN 5-325 MG PO TABS
1.0000 | ORAL_TABLET | ORAL | Status: DC | PRN
Start: 2013-08-28 — End: 2013-08-29
  Administered 2013-08-28 – 2013-08-29 (×2): 1 via ORAL
  Filled 2013-08-28 (×3): qty 1

## 2013-08-28 MED ORDER — FENTANYL CITRATE 0.05 MG/ML IJ SOLN: 100.0000 ug | INTRAMUSCULAR | Status: DC | PRN

## 2013-08-28 MED ORDER — DIPHENHYDRAMINE HCL 25 MG PO CAPS: 25.0000 mg | ORAL_CAPSULE | Freq: Four times a day (QID) | ORAL | Status: DC | PRN

## 2013-08-28 MED ORDER — LIDOCAINE HCL (PF) 1 % IJ SOLN
30.0000 mL | INTRAMUSCULAR | Status: DC | PRN
  Filled 2013-08-28 (×2): qty 30

## 2013-08-28 MED ORDER — IBUPROFEN 600 MG PO TABS
600.0000 mg | ORAL_TABLET | Freq: Four times a day (QID) | ORAL | Status: DC | PRN
  Administered 2013-08-28: 600 mg via ORAL
  Filled 2013-08-28: qty 1

## 2013-08-28 MED ORDER — IBUPROFEN 600 MG PO TABS
600.0000 mg | ORAL_TABLET | Freq: Four times a day (QID) | ORAL | Status: DC
  Administered 2013-08-28 – 2013-08-29 (×5): 600 mg via ORAL
  Filled 2013-08-28 (×5): qty 1

## 2013-08-28 MED ORDER — CITRIC ACID-SODIUM CITRATE 334-500 MG/5ML PO SOLN: 30.0000 mL | ORAL | Status: DC | PRN

## 2013-08-28 MED ORDER — EPHEDRINE 5 MG/ML INJ
10.0000 mg | INTRAVENOUS | Status: DC | PRN
  Filled 2013-08-28: qty 4
  Filled 2013-08-28: qty 2

## 2013-08-28 MED ORDER — DIPHENHYDRAMINE HCL 50 MG/ML IJ SOLN: 12.5000 mg | INTRAMUSCULAR | Status: DC | PRN

## 2013-08-28 MED ORDER — LACTATED RINGERS IV SOLN: 500.0000 mL | INTRAVENOUS | Status: DC | PRN

## 2013-08-28 MED ORDER — ONDANSETRON HCL 4 MG/2ML IJ SOLN: 4.0000 mg | Freq: Four times a day (QID) | INTRAMUSCULAR | Status: DC | PRN

## 2013-08-28 MED ORDER — SIMETHICONE 80 MG PO CHEW
80.0000 mg | CHEWABLE_TABLET | ORAL | Status: DC | PRN
Start: 2013-08-28 — End: 2013-08-29

## 2013-08-28 MED ORDER — MISOPROSTOL 200 MCG PO TABS
ORAL_TABLET | ORAL | Status: AC
  Administered 2013-08-28: 800 ug
  Filled 2013-08-28: qty 4

## 2013-08-28 MED ORDER — LACTATED RINGERS IV SOLN
500.0000 mL | Freq: Once | INTRAVENOUS | Status: AC
  Administered 2013-08-28: 1000 mL via INTRAVENOUS

## 2013-08-28 MED ORDER — ONDANSETRON HCL 4 MG PO TABS: 4.0000 mg | ORAL_TABLET | ORAL | Status: DC | PRN

## 2013-08-28 MED ORDER — OXYCODONE-ACETAMINOPHEN 5-325 MG PO TABS: 1.0000 | ORAL_TABLET | ORAL | Status: DC | PRN

## 2013-08-28 MED ORDER — OXYTOCIN 40 UNITS IN LACTATED RINGERS INFUSION - SIMPLE MED
62.5000 mL/h | INTRAVENOUS | Status: DC
  Filled 2013-08-28: qty 1000

## 2013-08-28 MED ORDER — EPHEDRINE 5 MG/ML INJ
10.0000 mg | INTRAVENOUS | Status: DC | PRN
  Administered 2013-08-28: 10 mg via INTRAVENOUS
  Filled 2013-08-28: qty 2

## 2013-08-28 MED ORDER — DIBUCAINE 1 % RE OINT: 1.0000 "application " | TOPICAL_OINTMENT | RECTAL | Status: DC | PRN

## 2013-08-28 MED ORDER — OXYTOCIN BOLUS FROM INFUSION
500.0000 mL | INTRAVENOUS | Status: DC
  Administered 2013-08-28: 500 mL via INTRAVENOUS

## 2013-08-28 MED ORDER — PHENYLEPHRINE 40 MCG/ML (10ML) SYRINGE FOR IV PUSH (FOR BLOOD PRESSURE SUPPORT)
80.0000 ug | PREFILLED_SYRINGE | INTRAVENOUS | Status: DC | PRN
  Filled 2013-08-28: qty 2

## 2013-08-28 MED ORDER — ACETAMINOPHEN 325 MG PO TABS: 650.0000 mg | ORAL_TABLET | ORAL | Status: DC | PRN

## 2013-08-28 NOTE — Anesthesia Postprocedure Evaluation (Signed)
Anesthesia Post Note  Patient: Kristin [name]  Procedure(s) Performed: * No procedures listed *  Anesthesia type: Epidural  Patient location: Mother/Baby  Post pain: Pain level controlled  Post assessment: Post-op Vital signs reviewed  Last Vitals:  Filed Vitals:   08/28/13 1037  BP: 138/72  Pulse: 89  Temp: 37 C  Resp: 20    Post vital signs: Reviewed  Level of consciousness:alert  Complications: No apparent anesthesia complications

## 2013-08-28 NOTE — Progress Notes (Signed)
Pt  Had deceleration to 70 lasting 90-110

## 2013-08-28 NOTE — H&P (Signed)
HPI: Kristin [name] is a 32 y.o. year old G13P1001 female at [redacted]w[redacted]d weeks gestation by LMP (confirmed by [redacted]w[redacted]d ultrasound) who presents to MAU reporting Spontaneous rupture of membranes Labor. She progressed from 2.5 to 6cm in [facility]U. [facility]  [facility]  Genetic Screen NT/IT: NT 2.23 thickened ;    IT normal (1:310 DSR)                Anatomic [place] Normal female  Glucose Screen Normal: 73/82/83  GBS Pos  Feeding Preference breast  Contraception depo  Circumcision Yes at FT  Ped: [facility]   Flu Shot:  07/10/13 TDAP 08/2013 @ [facility]  Maternal Medical History:  Reason for admission: Nausea.     OB History   Grav Para Term Preterm Abortions TAB SAB Ect Mult Living   2 1 1       1      Past Medical History  Diagnosis Date  . Migraines    Past Surgical History  Procedure Laterality Date  . No past surgeries     Family History: family history includes Diabetes in her mother; Hyperlipidemia in her father and mother; Hypertension in her father and mother; Other in her brother and mother. Social History:  reports that she has never smoked. She has never used smokeless tobacco. She reports that she drinks alcohol. She reports that she does not use illicit drugs.   Prenatal Transfer Tool  Maternal Diabetes: No Genetic Screening: Normal Maternal Ultrasounds/Referrals: Normal Fetal Ultrasounds or other Referrals:  None Maternal Substance Abuse:  No Significant Maternal Medications:  None Significant Maternal Lab Results:  Lab values include: Group B Strep positive Other Comments:  Thickened NT, but normal integrated screen. DSR 1:310.  Review of Systems  Respiratory: Negative for shortness of breath.   Cardiovascular: Negative for chest pain.  Gastrointestinal: Positive for nausea, vomiting and abdominal pain (Contractions 2 min long 3-5 min between). Negative for diarrhea, constipation and blood in stool.  Genitourinary:  Negative for dysuria and hematuria.    Dilation: 6 Effacement (%): 100 Station: -1 Exam by:: [place] Leonda Cristo CNM Blood pressure 100/56, pulse 70, temperature 98.5 F (36.9 C), temperature source Oral, resp. rate 16, height 5\' 4"  (1.626 m), weight 108.863 kg (240 lb), last menstrual period 12/03/2012, SpO2 96.00%. Maternal Exam:  Uterine Assessment: Contraction strength is moderate.  Contraction duration is 60 seconds. Contraction frequency is regular.   Abdomen: Fundal height is size equals dates.   Fetal presentation: vertex  Introitus: Normal vulva. Vulva is negative for lesion.  Normal vagina.  Ferning test: negative.  Nitrazine test: not done.  Pelvis: adequate for delivery.   Cervix: Cervix evaluated by sterile speculum exam and digital exam.     Physical Exam  Constitutional: She is oriented to person, place, and time. She appears well-developed and well-nourished. She appears distressed.  HENT:  Head: Normocephalic.  Eyes: Conjunctivae are normal.  Cardiovascular: Normal rate, regular rhythm and normal heart sounds.   Respiratory: Effort normal and breath sounds normal.  GI: Soft. There is no tenderness.  Genitourinary: Vagina normal and uterus normal. Vulva exhibits no lesion.  Musculoskeletal: Normal range of motion. She exhibits edema. She exhibits no tenderness.  Neurological: She is alert and oriented to person, place, and time.  Skin: Skin is warm and dry.  Psychiatric: She has a normal mood and affect.    Prenatal labs: ABO, Rh: O/POS/-- (04/29 1630) Antibody: NEG (09/10 0935) Rubella: 3.29 (04/29 1630)  RPR: NON REAC (09/10 0935)  HBsAg: NEGATIVE (04/29 1630)  HIV: NON REACTIVE (09/10 0935)  GBS: POSITIVE (11/19 1119)  Thickened NT, but normal Integrated screen.   Assessment: 1. Labor: active 2. Fetal Wellbeing: Category I-II  3. Pain Control: none 4. GBS: pos 5. 38.2 week IUP 6. Thickened NT, but normal Integrated screen.   Plan:  1. Admit to BS  per consult with MD 2. Routine L&D orders 3. Analgesia/anesthesia PRN  4. PCN  Tykwon Fera 08/28/2013, 4:43 AM

## 2013-08-28 NOTE — MAU Provider Note (Signed)
Chart reviewed and agree with management and plan.  

## 2013-08-28 NOTE — MAU Provider Note (Signed)
  History     CSN: [id number]  Arrival date and time: 08/28/13 5284   None     Chief Complaint  Patient presents with  . Contractions   HPI  Kristin [name] is a 32 y.o. G2P1001 at [redacted]w[redacted]d who presents today with possible SROM. She states that she has been having contractions since about 2130 and at 0030 she had a large gush of fluid. She has not continued to leak since the initial gush. She confirms fetal movement. She denies any vaginal bleeding. She has care at [facility].   Past Medical History  Diagnosis Date  . Migraines     Past Surgical History  Procedure Laterality Date  . No past surgeries      Family History  Problem Relation Age of Onset  . Other Mother     enlarged heart  . Diabetes Mother   . Hypertension Mother   . Hyperlipidemia Mother   . Hypertension Father   . Hyperlipidemia Father   . Other Brother     enlarged heart; colloid cyst of the third ventricle( of the brain)    History  Substance Use Topics  . Smoking status: Never Smoker   . Smokeless tobacco: Never Used  . Alcohol Use: Yes     Comment: occ; not now    Allergies: No Known Allergies  Prescriptions prior to admission  Medication Sig Dispense Refill  . acetaminophen (TYLENOL) 500 MG tablet Take 1,000 mg by mouth every 6 (six) hours as needed.      . butalbital-acetaminophen-caffeine (FIORICET, ESGIC) 50-325-40 MG per tablet Take 1 tablet by mouth every 4 (four) hours as needed for headache.  30 tablet  0  . omeprazole (PRILOSEC) 20 MG capsule Take 1 capsule (20 mg total) by mouth daily. 1 tablet a day  30 capsule  6  . prenatal vitamin w/FE, FA (PRENATAL 1 + 1) 27-1 MG TABS Take 1 tablet by mouth daily at 12 noon.  30 each  11    ROS Physical Exam   Blood pressure 100/58, pulse 96, temperature 98.5 F (36.9 C), temperature source Oral, resp. rate 16, last menstrual period 12/03/2012.  Physical Exam  Nursing note and vitals reviewed. Constitutional: She is oriented to person, place,  and time. She appears well-developed and well-nourished. No distress.  Cardiovascular: Normal rate.   Respiratory: Effort normal.  GI: Soft. There is no tenderness.  Genitourinary:   External: no lesion Vagina: small amount of white discharge Cervix: pink, smooth, 2/100/-2/BBOW Uterus:AGA FHT 140, moderate with 15x15 accels, no decels Toco: q 4-5 mins.   Neurological: She is alert and oriented to person, place, and time.  Skin: Skin is warm and dry.  Psychiatric: She has a normal mood and affect.    MAU Course  Procedures  0235: cervix: 2.5/100/-1, patient is very uncomfortable. Will provide pain medication and continue to monitor. Will recheck cervix in about an hour.     Assessment and Plan  Active labor Admit to labor and delivery.   Tawnya Crook 08/28/2013, 1:01 AM

## 2013-08-28 NOTE — MAU Note (Addendum)
Pt states she started having contractions at 2100 and was seen at [facility] today,.Pt states she was about 3cm in the office

## 2013-08-28 NOTE — Anesthesia Procedure Notes (Signed)
Epidural Patient location during procedure: OB Start time: 08/28/2013 5:02 AM  Staffing Anesthesiologist: Malen Gauze, Bayyinah Dukeman A. Performed by: anesthesiologist   Preanesthetic Checklist Completed: patient identified, site marked, surgical consent, pre-op evaluation, timeout performed, IV checked, risks and benefits discussed and monitors and equipment checked  Epidural Patient position: sitting Prep: site prepped and draped and DuraPrep Patient monitoring: continuous pulse ox and blood pressure Approach: midline Injection technique: LOR air  Needle:  Needle type: Tuohy  Needle gauge: 17 G Needle length: 9 cm and 9 Needle insertion depth: 9 cm Catheter type: closed end flexible Catheter size: 19 Gauge Catheter at skin depth: 14 cm Test dose: negative and Other  Assessment Events: blood not aspirated, injection not painful, no injection resistance, negative IV test and no paresthesia  Additional Notes Patient identified. Risks and benefits discussed including failed block, incomplete  Pain control, post dural puncture headache, nerve damage, paralysis, blood pressure Changes, nausea, vomiting, reactions to medications-both toxic and allergic and post Partum back pain. All questions were answered. Patient expressed understanding and wished to proceed. Sterile technique was used throughout procedure. Epidural site was Dressed with sterile barrier dressing. No paresthesias, signs of intravascular injection Or signs of intrathecal spread were encountered.  Patient was more comfortable after the epidural was dosed. Please see RN's note for documentation of vital signs and FHR which are stable.

## 2013-08-28 NOTE — MAU Provider Note (Signed)
  History     CSN: [id number]  Arrival date and time: 08/28/13 0865  First Provider Initiated Contact with Patient 08/28/2013 at 0102.    Chief Complaint  Patient presents with  . Contractions   HPI  Kristin [name] is a 32 y.o. G2P1001 at [redacted]w[redacted]d who presents today with possible SROM. She states that she has been having contractions since about 2130 and at 0030 she had a large gush of fluid. She has not continued to leak since the initial gush. She confirms fetal movement. She denies any vaginal bleeding. She has care at [facility].   Past Medical History  Diagnosis Date  . Migraines     Past Surgical History  Procedure Laterality Date  . No past surgeries      Family History  Problem Relation Age of Onset  . Other Mother     enlarged heart  . Diabetes Mother   . Hypertension Mother   . Hyperlipidemia Mother   . Hypertension Father   . Hyperlipidemia Father   . Other Brother     enlarged heart; colloid cyst of the third ventricle( of the brain)    History  Substance Use Topics  . Smoking status: Never Smoker   . Smokeless tobacco: Never Used  . Alcohol Use: Yes     Comment: occ; not now    Allergies: No Known Allergies  Prescriptions prior to admission  Medication Sig Dispense Refill  . acetaminophen (TYLENOL) 500 MG tablet Take 1,000 mg by mouth every 6 (six) hours as needed.      . butalbital-acetaminophen-caffeine (FIORICET, ESGIC) 50-325-40 MG per tablet Take 1 tablet by mouth every 4 (four) hours as needed for headache.  30 tablet  0  . omeprazole (PRILOSEC) 20 MG capsule Take 1 capsule (20 mg total) by mouth daily. 1 tablet a day  30 capsule  6  . prenatal vitamin w/FE, FA (PRENATAL 1 + 1) 27-1 MG TABS Take 1 tablet by mouth daily at 12 noon.  30 each  11    ROS Physical Exam   Blood pressure 100/56, pulse 70, temperature 98.5 F (36.9 C), temperature source Oral, resp. rate 16, height 5\' 4"  (1.626 m), weight 108.863 kg (240 lb), last menstrual period  12/03/2012, SpO2 96.00%.  Physical Exam  Nursing note and vitals reviewed. Constitutional: She is oriented to person, place, and time. She appears well-developed and well-nourished. No distress.  Cardiovascular: Normal rate.   Respiratory: Effort normal.  GI: Soft. There is no tenderness.  Genitourinary:   External: no lesion Vagina: small amount of white discharge Cervix: pink, smooth, 2/100/-2/BBOW Uterus:AGA FHT 140, moderate with 15x15 accels, no decels Toco: q 4-5 mins.   Neurological: She is alert and oriented to person, place, and time.  Skin: Skin is warm and dry.  Psychiatric: She has a normal mood and affect.    MAU Course  Procedures  0235: cervix: 2.5/100/-1, patient is very uncomfortable. Will provide pain medication and continue to monitor. Will recheck cervix in about an hour.   7846: Cervix 6/100/-1. Patient still appeared to be uncomfortable despite pain medication.   Assessment and Plan   Currently active labor.  Plan: Admit to labor and delivery. See H&P.  Dorathy Kinsman 08/28/2013, 4:34 AM

## 2013-08-28 NOTE — Progress Notes (Signed)
Pt states she "passed urine when she got out of the car or had SROM

## 2013-08-28 NOTE — Anesthesia Preprocedure Evaluation (Signed)
Anesthesia Evaluation  Patient identified by MRN, date of birth, ID band Patient awake    Reviewed: Allergy & Precautions, H&P , Patient's Chart, lab work & pertinent test results  Airway Mallampati: III TM Distance: >3 FB Neck ROM: full    Dental no notable dental hx. (+) Teeth Intact   Pulmonary neg pulmonary ROS,  breath sounds clear to auscultation  Pulmonary exam normal       Cardiovascular negative cardio ROS  Rhythm:regular Rate:Normal     Neuro/Psych  Headaches, negative neurological ROS  negative psych ROS   GI/Hepatic negative GI ROS, Neg liver ROS,   Endo/Other  negative endocrine ROSMorbid obesity  Renal/GU negative Renal ROS  negative genitourinary   Musculoskeletal   Abdominal Normal abdominal exam  (+)   Peds  Hematology negative hematology ROS (+)   Anesthesia Other Findings   Reproductive/Obstetrics (+) Pregnancy                           Anesthesia Physical Anesthesia Plan  ASA: II  Anesthesia Plan: Epidural   Post-op Pain Management:    Induction:   Airway Management Planned:   Additional Equipment:   Intra-op Plan:   Post-operative Plan:   Informed Consent: I have reviewed the patients History and Physical, chart, labs and discussed the procedure including the risks, benefits and alternatives for the proposed anesthesia with the patient or authorized representative who has indicated his/her understanding and acceptance.     Plan Discussed with: Anesthesiologist  Anesthesia Plan Comments:         Anesthesia Quick Evaluation

## 2013-08-29 DIAGNOSIS — Z22330 Carrier of Group B streptococcus: Secondary | ICD-10-CM

## 2013-08-29 DIAGNOSIS — O9989 Other specified diseases and conditions complicating pregnancy, childbirth and the puerperium: Secondary | ICD-10-CM

## 2013-08-29 DIAGNOSIS — O99892 Other specified diseases and conditions complicating childbirth: Secondary | ICD-10-CM

## 2013-08-29 LAB — CBC
HCT: 32 % — ABNORMAL LOW (ref 36.0–46.0)
MCV: 82.3 fL (ref 78.0–100.0)
RBC: 3.89 MIL/uL (ref 3.87–5.11)
WBC: 11.1 10*3/uL — ABNORMAL HIGH (ref 4.0–10.5)

## 2013-08-29 MED ORDER — OXYCODONE-ACETAMINOPHEN 5-325 MG PO TABS: 1.0000 | ORAL_TABLET | Freq: Three times a day (TID) | ORAL | Status: DC | PRN

## 2013-08-29 MED ORDER — IBUPROFEN 600 MG PO TABS: 600.0000 mg | ORAL_TABLET | Freq: Four times a day (QID) | ORAL | Status: DC

## 2013-08-29 NOTE — H&P (Signed)
Chart reviewed and agree with management and plan.  

## 2013-08-29 NOTE — Lactation Note (Signed)
This note was copied from the chart of Kristin Jadan Rouillard. Lactation Consultation Note  Patient Name: Kristin [name] [id number] Date: 08/29/2013 Reason for consult: Follow-up assessment Mom reports baby is nursing well, cluster feeding. Basic teaching reviewed. Engorgement care reviewed if needed. Advised of OP services and support group. Advised Mom to call if she would like LC to observe feeding.   Maternal Data    Feeding Feeding Type: Breast Fed Length of feed: 30 min  LATCH Score/Interventions Latch: Repeated attempts needed to sustain latch, nipple held in mouth throughout feeding, stimulation needed to elicit sucking reflex. Intervention(s): Adjust position  Audible Swallowing: A few with stimulation Intervention(s): Skin to skin  Type of Nipple: Everted at rest and after stimulation  Comfort (Breast/Nipple): Soft / non-tender     Hold (Positioning): No assistance needed to correctly position infant at breast.  LATCH Score: 8  Lactation Tools Discussed/Used Tools: Pump Breast pump type: Manual   Consult Status Consult Status: Complete Date: 08/29/13 Follow-up type: In-patient    Alfred Levins 08/29/2013, 8:30 AM

## 2013-08-29 NOTE — Discharge Summary (Signed)
Obstetric Discharge Summary Reason for Admission: rupture of membranes Prenatal Procedures: none Intrapartum Procedures: spontaneous vaginal delivery Postpartum Procedures: none Complications-Operative and Postpartum: none Hemoglobin  Date Value Range Status  08/29/2013 10.7* 12.0 - 15.0 g/dL Final     HCT  Date Value Range Status  08/29/2013 32.0* 36.0 - 46.0 % Final    Physical Exam:  General: alert, cooperative, appears stated age and no distress Lochia: appropriate Uterine Fundus: firm DVT Evaluation: No evidence of DVT seen on physical exam. Negative Homan's sign. No cords or calf tenderness. No significant calf/ankle edema.  Discharge Diagnoses: Term Pregnancy-delivered  Discharge Information: Date: 08/29/2013 Activity: pelvic rest Diet: routine Medications: PNV and Ibuprofen Condition: stable Instructions: refer to practice specific booklet Discharge to: home Follow-up Information   Follow up with [facility]. Schedule an appointment as soon as possible for a visit in 4 weeks.   Specialty:  Obstetrics and Gynecology   Contact information:   [address] [place] [address] [phone]      Newborn Data: Live born female  Birth Weight: 6 lb 12.8 oz (3085 g) APGAR: 8, 9  Home with mother.  Pt came in [facility] and progressed to deliver vaginally on 11/27 without complication. Pt is breast feeding, meeting milestones and desires depo for contraception.   Tawana Scale 08/29/2013, 7:55 AMdo

## 2013-09-03 ENCOUNTER — Encounter: Payer: Medicaid Other | Admitting: Advanced Practice Midwife

## 2013-09-04 ENCOUNTER — Telehealth: Payer: Self-pay | Admitting: Obstetrics and Gynecology

## 2013-09-04 NOTE — Telephone Encounter (Signed)
Pt requesting FMLA forms to be changed from 08/28/2013 to 09/02/2013 and faxed. Pt informed will refax FMLA today.

## 2013-09-30 ENCOUNTER — Ambulatory Visit: Payer: Medicaid Other | Admitting: Women's Health

## 2013-10-01 ENCOUNTER — Encounter (INDEPENDENT_AMBULATORY_CARE_PROVIDER_SITE_OTHER): Payer: Self-pay

## 2013-10-01 ENCOUNTER — Ambulatory Visit: Payer: Medicaid Other | Admitting: Advanced Practice Midwife

## 2013-10-03 NOTE — L&D Delivery Note (Signed)
Delivery Note At 12:28 AM a viable and healthy female was delivered via Vaginal, Spontaneous Delivery with one push, no epidural.  (Presentation: ; Occiput Anterior).  APGAR: 9, 9; weight pending .   Placenta status: intact, spontaneous.  Cord: 3 vessels with the following complications: Short.    Anesthesia: None  Episiotomy: None Lacerations: None Suture Repair: N/A Est. Blood Loss (mL): 200  Mom to postpartum.  Baby to Couplet care / Skin to Skin.  Benjamin Stainhompson, McKenzie L, MD 09/23/2014, 12:55 AM   OB fellow attestation:  I have seen and examined this patient; I agree with above documentation in the resident's note. I was present for and assisted with the entirety of the delivery and immediate post delivery care as documented above.   William DaltonMcEachern, Iran Rowe, MD 1:59 AM

## 2013-10-08 ENCOUNTER — Ambulatory Visit (INDEPENDENT_AMBULATORY_CARE_PROVIDER_SITE_OTHER): Payer: Medicaid Other | Admitting: Advanced Practice Midwife

## 2013-10-08 ENCOUNTER — Encounter: Payer: Self-pay | Admitting: Advanced Practice Midwife

## 2013-10-08 VITALS — BP 136/80 | Ht 64.0 in | Wt 237.0 lb

## 2013-10-08 DIAGNOSIS — Z3202 Encounter for pregnancy test, result negative: Secondary | ICD-10-CM

## 2013-10-08 LAB — POCT URINE PREGNANCY: PREG TEST UR: NEGATIVE

## 2013-10-08 MED ORDER — SERTRALINE HCL 50 MG PO TABS: 50.0000 mg | ORAL_TABLET | Freq: Every day | ORAL | Status: DC

## 2013-10-08 NOTE — Progress Notes (Signed)
  Kristin [name] is a 33 y.o. who presents for a postpartum visit. She is 6 weeks postpartum following a spontaneous vaginal delivery. I have fully reviewed the prenatal and intrapartum course. The delivery was at 38.2 gestational weeks.  Anesthesia: epidural. Postpartum course has been uneventful. Baby's course has been uneventful. Baby is feeding by breast and bottle. Bleeding: no bleeding. Bowel function is normal. Bladder function is normal. Patient is sexually active. Contraception method is none. Postpartum depression screening: indeterminate. Cries and feels emotional and easily frustrated.  Wants to try SSRI. Risks/.benefits discussed.  Has taken Lexapro (made her nauseated) so will try Zoloft.     Review of Systems   Constitutional: Negative for fever and chills Eyes: Negative for visual disturbances Respiratory: Negative for shortness of breath, dyspnea Cardiovascular: Negative for chest pain or palpitations  Gastrointestinal: Negative for vomiting, diarrhea and constipation Genitourinary: Negative for dysuria and urgency Musculoskeletal: Negative for back pain, joint pain, myalgias  Neurological: Negative for dizziness and headaches   Objective:     Filed Vitals:   10/08/13 1016  BP: 136/80   General:  alert, cooperative and no distress   Breasts:  negative  Lungs: clear to auscultation bilaterally  Heart:  regular rate and rhythm  Abdomen: Soft, nontender   Vulva:  normal  Vagina: normal vagina  Cervix:  closed  Corpus: Well involuted     Rectal Exam: no hemorrhoids        Assessment:    normal postpartum exam.  Plan:    1. Contraception: Nexplanon.   2. Follow up in: 10 days for HCG/Nexplanon.  Will see how zoloft is doing then, as well.

## 2013-10-16 ENCOUNTER — Encounter: Payer: Medicaid Other | Admitting: Women's Health

## 2013-10-16 ENCOUNTER — Other Ambulatory Visit: Payer: Medicaid Other

## 2013-10-31 ENCOUNTER — Other Ambulatory Visit: Payer: Medicaid Other

## 2013-10-31 ENCOUNTER — Ambulatory Visit: Payer: Medicaid Other | Admitting: Adult Health

## 2013-11-05 ENCOUNTER — Encounter: Payer: Self-pay | Admitting: Adult Health

## 2013-11-05 ENCOUNTER — Ambulatory Visit (INDEPENDENT_AMBULATORY_CARE_PROVIDER_SITE_OTHER): Payer: Medicaid Other | Admitting: Adult Health

## 2013-11-05 VITALS — BP 118/74 | Ht 64.0 in | Wt 242.0 lb

## 2013-11-05 DIAGNOSIS — Z309 Encounter for contraceptive management, unspecified: Secondary | ICD-10-CM

## 2013-11-05 DIAGNOSIS — Z3049 Encounter for surveillance of other contraceptives: Secondary | ICD-10-CM

## 2013-11-05 DIAGNOSIS — Z3200 Encounter for pregnancy test, result unknown: Secondary | ICD-10-CM

## 2013-11-05 DIAGNOSIS — Z3202 Encounter for pregnancy test, result negative: Secondary | ICD-10-CM

## 2013-11-05 HISTORY — DX: Encounter for contraceptive management, unspecified: Z30.9

## 2013-11-05 LAB — POCT URINE PREGNANCY: Preg Test, Ur: NEGATIVE

## 2013-11-05 MED ORDER — NORETHIN ACE-ETH ESTRAD-FE 1-20 MG-MCG(24) PO CHEW: 1.0000 | CHEWABLE_TABLET | Freq: Every day | ORAL | Status: DC

## 2013-11-05 NOTE — Progress Notes (Signed)
Subjective:     Patient ID: Kristin IvanMiranda [name], female   DOB: 10/30/1980, 33 y.o.   MRN: [id number]  HPI Kristin [name] is a 33 year old black female, married, in to get on birth control, she does not want nexplanon, she wants the pill.She has migraines but denies aura.Has used OCs in past.She does want 1 more child.  Review of Systems See HPI Reviewed past medical,surgical, social and family history. Reviewed medications and allergies.     Objective:   Physical Exam BP 118/74  Ht 5\' 4"  (1.626 m)  Wt 242 lb (109.77 kg)  BMI 41.52 kg/m2  LMP 10/04/2013  Breastfeeding? NoUPT negative, discussed OCs and she wants to start them, says she is doing well on Zoloft.Last sex about a week ago, and was unprotected.    Assessment:    Contraceptive management    Plan:     Rx minastrin, disp 1 pack, take 1 daily with 11 refills, start with next period Use condoms til starts second pack of pills   Return in 1 year for  physical, pap 2017 Review handout on OC use

## 2013-11-05 NOTE — Patient Instructions (Signed)
Oral Contraception Use Oral contraceptive pills (OCPs) are medicines taken to prevent pregnancy. OCPs work by preventing the ovaries from releasing eggs. The hormones in OCPs also cause the cervical mucus to thicken, preventing the sperm from entering the uterus. The hormones also cause the uterine lining to become thin, not allowing a fertilized egg to attach to the inside of the uterus. OCPs are highly effective when taken exactly as prescribed. However, OCPs do not prevent sexually transmitted diseases (STDs). Safe sex practices, such as using condoms along with an OCP, can help prevent STDs. Before taking OCPs, you may have a physical exam and Pap test. Your health care provider may also order blood tests if necessary. Your health care provider will make sure you are a good candidate for oral contraception. Discuss with your health care provider the possible side effects of the OCP you may be prescribed. When starting an OCP, it can take 2 to 3 months for the body to adjust to the changes in hormone levels in your body.  HOW TO TAKE ORAL CONTRACEPTIVE PILLS Your health care provider may advise you on how to start taking the first cycle of OCPs. Otherwise, you can:   Start on day 1 of your menstrual period. You will not need any backup contraceptive protection with this start time.   Start on the first Sunday after your menstrual period or the day you get your prescription. In these cases, you will need to use backup contraceptive protection for the first week.   Start the pill at any time of your cycle. If you take the pill within 5 days of the start of your period, you are protected against pregnancy right away. In this case, you will not need a backup form of birth control. If you start at any other time of your menstrual cycle, you will need to use another form of birth control for 7 days. If your OCP is the type called a minipill, it will protect you from pregnancy after taking it for 2 days (48  hours). After you have started taking OCPs:   If you forget to take 1 pill, take it as soon as you remember. Take the next pill at the regular time.   If you miss 2 or more pills, call your health care provider because different pills have different instructions for missed doses. Use backup birth control until your next menstrual period starts.   If you use a 28-day pack that contains inactive pills and you miss 1 of the last 7 pills (pills with no hormones), it will not matter. Throw away the rest of the nonhormone pills and start a new pill pack.  No matter which day you start the OCP, you will always start a new pack on that same day of the week. Have an extra pack of OCPs and a backup contraceptive method available in case you miss some pills or lose your OCP pack.  HOME CARE INSTRUCTIONS   Do not smoke.   Always use a condom to protect against STDs. OCPs do not protect against STDs.   Use a calendar to mark your menstrual period days.   Read the information and directions that came with your OCP. Talk to your health care provider if you have questions.  SEEK MEDICAL CARE IF:   You develop nausea and vomiting.   You have abnormal vaginal discharge or bleeding.   You develop a rash.   You miss your menstrual period.   You are losing   your hair.   You need treatment for mood swings or depression.   You get dizzy when taking the OCP.   You develop acne from taking the OCP.   You become pregnant.  SEEK IMMEDIATE MEDICAL CARE IF:   You develop chest pain.   You develop shortness of breath.   You have an uncontrolled or severe headache.   You develop numbness or slurred speech.   You develop visual problems.   You develop pain, redness, and swelling in the legs.  Document Released: 09/08/2011 Document Revised: 05/22/2013 Document Reviewed: 03/10/2013 [facility] Patient Information 2014 [place], [place]. Start OCs with next period, use condoms til  starts second pack of pills Follow up in 1 year

## 2014-02-19 ENCOUNTER — Other Ambulatory Visit: Payer: Self-pay | Admitting: Obstetrics & Gynecology

## 2014-02-21 ENCOUNTER — Other Ambulatory Visit: Payer: Self-pay | Admitting: Obstetrics and Gynecology

## 2014-02-21 DIAGNOSIS — O3680X0 Pregnancy with inconclusive fetal viability, not applicable or unspecified: Secondary | ICD-10-CM

## 2014-02-25 ENCOUNTER — Ambulatory Visit (INDEPENDENT_AMBULATORY_CARE_PROVIDER_SITE_OTHER): Payer: Medicaid Other

## 2014-02-25 ENCOUNTER — Other Ambulatory Visit: Payer: Self-pay | Admitting: Obstetrics and Gynecology

## 2014-02-25 DIAGNOSIS — O3680X0 Pregnancy with inconclusive fetal viability, not applicable or unspecified: Secondary | ICD-10-CM

## 2014-02-25 DIAGNOSIS — O26849 Uterine size-date discrepancy, unspecified trimester: Secondary | ICD-10-CM

## 2014-02-25 NOTE — Progress Notes (Signed)
U/S-single IUP with +FCA noted, FHR-167 bpm, cx appears long and closed, bilateral adnexa appears wnl, CRL c/w 8+2wks EDD 10/05/2014

## 2014-03-06 ENCOUNTER — Encounter: Payer: Self-pay | Admitting: Advanced Practice Midwife

## 2014-03-06 ENCOUNTER — Ambulatory Visit (INDEPENDENT_AMBULATORY_CARE_PROVIDER_SITE_OTHER): Payer: Medicaid Other | Admitting: Advanced Practice Midwife

## 2014-03-06 ENCOUNTER — Encounter: Payer: Medicaid Other | Admitting: Advanced Practice Midwife

## 2014-03-06 VITALS — BP 120/70 | Wt 251.5 lb

## 2014-03-06 DIAGNOSIS — O09891 Supervision of other high risk pregnancies, first trimester: Secondary | ICD-10-CM | POA: Insufficient documentation

## 2014-03-06 DIAGNOSIS — O09529 Supervision of elderly multigravida, unspecified trimester: Secondary | ICD-10-CM

## 2014-03-06 DIAGNOSIS — O0991 Supervision of high risk pregnancy, unspecified, first trimester: Secondary | ICD-10-CM

## 2014-03-06 DIAGNOSIS — Z1389 Encounter for screening for other disorder: Secondary | ICD-10-CM

## 2014-03-06 DIAGNOSIS — Z331 Pregnant state, incidental: Secondary | ICD-10-CM

## 2014-03-06 DIAGNOSIS — Z3480 Encounter for supervision of other normal pregnancy, unspecified trimester: Secondary | ICD-10-CM | POA: Insufficient documentation

## 2014-03-06 DIAGNOSIS — O09899 Supervision of other high risk pregnancies, unspecified trimester: Secondary | ICD-10-CM

## 2014-03-06 LAB — POCT URINALYSIS DIPSTICK
GLUCOSE UA: NEGATIVE
Ketones, UA: NEGATIVE
Leukocytes, UA: NEGATIVE
Nitrite, UA: NEGATIVE
PROTEIN UA: NEGATIVE
RBC UA: NEGATIVE

## 2014-03-06 LAB — CBC
HEMATOCRIT: 34.8 % — AB (ref 36.0–46.0)
Hemoglobin: 11.5 g/dL — ABNORMAL LOW (ref 12.0–15.0)
MCH: 26.9 pg (ref 26.0–34.0)
MCHC: 33 g/dL (ref 30.0–36.0)
MCV: 81.5 fL (ref 78.0–100.0)
PLATELETS: 292 10*3/uL (ref 150–400)
RBC: 4.27 MIL/uL (ref 3.87–5.11)
RDW: 14.6 % (ref 11.5–15.5)
WBC: 6.9 10*3/uL (ref 4.0–10.5)

## 2014-03-06 NOTE — Progress Notes (Signed)
  Subjective:    Kristin [name] is a [id number] [redacted]w[redacted]d being seen today for her first obstetrical visit.  Her obstetrical history is significant for advanced maternal age and short interval between pregnancies.  Pregnancy history fully reviewed.  Patient reports no complaints.  Filed Vitals:   03/06/14 1453  BP: 120/70  Weight: 251 lb 8 oz (114.08 kg)    HISTORY: OB History  Gravida Para Term Preterm AB SAB TAB Ectopic Multiple Living  3 2 2       2     # Outcome Date GA Lbr Len/2nd Weight Sex Delivery Anes PTL Lv  3 CUR           2 TRM 08/28/13 [redacted]w[redacted]d 07:40 / 00:26 6 lb 12.8 oz (3.085 kg) M SVD EPI  Y  1 TRM 2000 [redacted]w[redacted]d  7 lb (3.175 kg) M SVD None N Y     Past Medical History  Diagnosis Date  . Migraines   . Vaginal Pap smear, abnormal   . Contraceptive management 11/05/2013   Past Surgical History  Procedure Laterality Date  . No past surgeries     Family History  Problem Relation Age of Onset  . Other Mother     enlarged heart  . Diabetes Mother   . Hypertension Mother   . Hyperlipidemia Mother   . Hypertension Father   . Hyperlipidemia Father   . Other Brother     enlarged heart; colloid cyst of the third ventricle( of the brain)     Exam       Pelvic Exam:    Perineum: Normal Perineum   Vulva: normal   Vagina:  normal mucosa, normal discharge, no palpable nodules   Uterus 9 weeks size. FHT 160 by u/s                Urinary:  urethral meatus normal    System:     Skin: normal coloration and turgor, no rashes    Neurologic: oriented, normal, normal mood   Extremities: normal strength, tone, and muscle mass   HEENT PERRLA   Mouth/Teeth mucous membranes moist, pharynx normal without lesions   Neck supple and no masses   Cardiovascular: regular rate and rhythm   Respiratory:  appears well, vitals normal, no respiratory distress, acyanotic, normal RR   Abdomen: soft, non-tender; bowel sounds normal; no masses,  no organomegaly          Assessment:     Pregnancy: [id number] Patient Active Problem List   Diagnosis Date Noted  . Supervision of high risk pregnancy in first trimester 03/06/2014  . AMA (advanced maternal age) multigravida 35+ 03/06/2014  . Short interval between pregnancies complicating pregnancy in first trimester, antepartum 03/06/2014  . Migraines 03/07/2013        Plan:     Initial labs drawn. Continue prenatal vitamins  Problem list reviewed and updated  Reviewed recommended weight gain based on pre-gravid BMI  Encouraged well-balanced diet Genetic Screening discussed Integrated Screen: requested.  Ultrasound discussed; fetal survey: requested.  Follow up in 3 weeks for NT/IT  Jacklyn Shell 03/06/2014

## 2014-03-07 LAB — DRUG SCREEN, URINE, NO CONFIRMATION
AMPHETAMINE SCRN UR: NEGATIVE
BARBITURATE QUANT UR: NEGATIVE
Benzodiazepines.: NEGATIVE
COCAINE METABOLITES: NEGATIVE
Creatinine,U: 130 mg/dL
MARIJUANA METABOLITE: NEGATIVE
METHADONE: NEGATIVE
Opiate Screen, Urine: NEGATIVE
Phencyclidine (PCP): NEGATIVE
Propoxyphene: NEGATIVE

## 2014-03-07 LAB — HIV ANTIBODY (ROUTINE TESTING W REFLEX): HIV: NONREACTIVE

## 2014-03-07 LAB — URINALYSIS
Bilirubin Urine: NEGATIVE
GLUCOSE, UA: NEGATIVE mg/dL
Hgb urine dipstick: NEGATIVE
KETONES UR: NEGATIVE mg/dL
Leukocytes, UA: NEGATIVE
Nitrite: NEGATIVE
PH: 6.5 (ref 5.0–8.0)
Protein, ur: NEGATIVE mg/dL
Specific Gravity, Urine: 1.014 (ref 1.005–1.030)
Urobilinogen, UA: 0.2 mg/dL (ref 0.0–1.0)

## 2014-03-07 LAB — HEPATITIS B SURFACE ANTIGEN: Hepatitis B Surface Ag: NEGATIVE

## 2014-03-07 LAB — GC/CHLAMYDIA PROBE AMP
CT Probe RNA: NEGATIVE
GC Probe RNA: NEGATIVE

## 2014-03-07 LAB — RPR

## 2014-03-07 LAB — OXYCODONE SCREEN, UA, RFLX CONFIRM: Oxycodone Screen, Ur: NEGATIVE ng/mL

## 2014-03-07 LAB — ANTIBODY SCREEN: Antibody Screen: NEGATIVE

## 2014-03-07 LAB — RUBELLA SCREEN: RUBELLA: 2.91 {index} — AB (ref <0.90)

## 2014-03-07 LAB — VARICELLA ZOSTER ANTIBODY, IGG: Varicella IgG: 2102 Index — ABNORMAL HIGH (ref <135.00)

## 2014-03-08 LAB — URINE CULTURE

## 2014-03-19 ENCOUNTER — Other Ambulatory Visit: Payer: Self-pay | Admitting: Advanced Practice Midwife

## 2014-03-19 DIAGNOSIS — Z36 Encounter for antenatal screening of mother: Secondary | ICD-10-CM

## 2014-03-19 DIAGNOSIS — O09529 Supervision of elderly multigravida, unspecified trimester: Secondary | ICD-10-CM

## 2014-03-24 ENCOUNTER — Encounter (HOSPITAL_COMMUNITY): Payer: Self-pay

## 2014-03-25 ENCOUNTER — Encounter: Payer: Self-pay | Admitting: Women's Health

## 2014-03-25 ENCOUNTER — Ambulatory Visit (INDEPENDENT_AMBULATORY_CARE_PROVIDER_SITE_OTHER): Payer: Medicaid Other

## 2014-03-25 ENCOUNTER — Ambulatory Visit (INDEPENDENT_AMBULATORY_CARE_PROVIDER_SITE_OTHER): Payer: Self-pay | Admitting: Women's Health

## 2014-03-25 VITALS — BP 102/70 | Wt 252.0 lb

## 2014-03-25 DIAGNOSIS — Z1389 Encounter for screening for other disorder: Secondary | ICD-10-CM

## 2014-03-25 DIAGNOSIS — O09521 Supervision of elderly multigravida, first trimester: Secondary | ICD-10-CM

## 2014-03-25 DIAGNOSIS — Z36 Encounter for antenatal screening of mother: Secondary | ICD-10-CM

## 2014-03-25 DIAGNOSIS — N764 Abscess of vulva: Secondary | ICD-10-CM

## 2014-03-25 DIAGNOSIS — Z3481 Encounter for supervision of other normal pregnancy, first trimester: Secondary | ICD-10-CM

## 2014-03-25 DIAGNOSIS — Z331 Pregnant state, incidental: Secondary | ICD-10-CM

## 2014-03-25 DIAGNOSIS — O09529 Supervision of elderly multigravida, unspecified trimester: Secondary | ICD-10-CM

## 2014-03-25 DIAGNOSIS — Z3480 Encounter for supervision of other normal pregnancy, unspecified trimester: Secondary | ICD-10-CM

## 2014-03-25 LAB — POCT URINALYSIS DIPSTICK
Blood, UA: NEGATIVE
GLUCOSE UA: NEGATIVE
KETONES UA: NEGATIVE
Leukocytes, UA: NEGATIVE
Nitrite, UA: NEGATIVE
Protein, UA: NEGATIVE

## 2014-03-25 MED ORDER — CLINDAMYCIN HCL 300 MG PO CAPS: 600.0000 mg | ORAL_CAPSULE | Freq: Three times a day (TID) | ORAL | Status: DC

## 2014-03-25 NOTE — Progress Notes (Signed)
U/S(12+2wks)-single IUP with +FCA noted, FHR-155 bpm, CRL c/w dates, NB present, NT-1.3148mm, anterior Gr 0 placenta, cx appears closed(3.2cm), bilateral adnexa appears WNL

## 2014-03-25 NOTE — Progress Notes (Signed)
Low-risk OB appointment [id number] Estimated Date of Delivery: 10/05/14 Blood pressure 102/70, weight 252 lb (114.306 kg), last menstrual period 12/23/2013, not currently breastfeeding.  BP, weight, and urine reviewed.  Refer to obstetrical flow sheet for FH & FHR.  Reports good fm.  Denies regular uc's, lof, vb, or uti s/s. HAs, some in neck like tension ha's, some unilateral eye orbit- doesn't feel like her migraines though. Taking apap, sometimes fioricet that she had w/ last pregnancy.  Tender boil on Rt labia majora that came up this wkend, had one few years ago on upper thigh. Boil erythematous, tender, fluctuant, co-exam w/ LHE- will treat w/ cleocin 600mg  TID x 10d and recheck in 2wks.  Reviewed today's NT u/s, warning s/s to report. Plan:  Continue routine obstetrical care  F/U in 2wks for OB appointment to recheck boil, then 2wks after that for 2nd IT and visit 1st IT/NT today

## 2014-03-25 NOTE — Patient Instructions (Signed)
Pregnancy - First Trimester During sexual intercourse, millions of sperm go into the vagina. Only 1 sperm will penetrate and fertilize the female egg while it is in the Fallopian tube. One week later, the fertilized egg implants into the wall of the uterus. An embryo begins to develop into a baby. At 6 to 8 weeks, the eyes and face are formed and the heartbeat can be seen on ultrasound. At the end of 12 weeks (first trimester), all the baby's organs are formed. Now that you are pregnant, you will want to do everything you can to have a healthy baby. Two of the most important things are to get good prenatal care and follow your caregiver's instructions. Prenatal care is all the medical care you receive before the baby's birth. It is given to prevent, find, and treat problems during the pregnancy and childbirth. PRENATAL EXAMS  During prenatal visits, your weight, blood pressure, and urine are checked. This is done to make sure you are healthy and progressing normally during the pregnancy.  A pregnant woman should gain 25 to 35 pounds during the pregnancy. However, if you are overweight or underweight, your caregiver will advise you regarding your weight.  Your caregiver will ask and answer questions for you.  Blood work, cervical cultures, other necessary tests, and a Pap test are done during your prenatal exams. These tests are done to check on your health and the probable health of your baby. Tests are strongly recommended and done for HIV with your permission. This is the virus that causes AIDS. These tests are done because medicines can be given to help prevent your baby from being born with this infection should you have been infected without knowing it. Blood work is also used to find out your blood type, previous infections, and follow your blood levels (hemoglobin).  Low hemoglobin (anemia) is common during pregnancy. Iron and vitamins are given to help prevent this. Later in the pregnancy, blood  tests for diabetes will be done along with any other tests if any problems develop.  You may need other tests to make sure you and the baby are doing well. CHANGES DURING THE FIRST TRIMESTER  Your body goes through many changes during pregnancy. They vary from person to person. Talk to your caregiver about changes you notice and are concerned about. Changes can include:  Your menstrual period stops.  The egg and sperm carry the genes that determine what you look like. Genes from you and your partner are forming a baby. The female genes determine whether the baby is a boy or a girl.  Your body increases in girth and you may feel bloated.  Feeling sick to your stomach (nauseous) and throwing up (vomiting). If the vomiting is uncontrollable, call your caregiver.  Your breasts will begin to enlarge and become tender.  Your nipples may stick out more and become darker.  The need to urinate more. Painful urination may mean you have a bladder infection.  Tiring easily.  Loss of appetite.  Cravings for certain kinds of food.  At first, you may gain or lose a couple of pounds.  You may have changes in your emotions from day to day (excited to be pregnant or concerned something may go wrong with the pregnancy and baby).  You may have more vivid and strange dreams. HOME CARE INSTRUCTIONS   It is very important to avoid all smoking, alcohol and non-prescribed drugs during your pregnancy. These affect the formation and growth of the baby.   Avoid chemicals while pregnant to ensure the delivery of a healthy infant.  Start your prenatal visits by the 12th week of pregnancy. They are usually scheduled monthly at first, then more often in the last 2 months before delivery. Keep your caregiver's appointments. Follow your caregiver's instructions regarding medicine use, blood and lab tests, exercise, and diet.  During pregnancy, you are providing food for you and your baby. Eat regular, well-balanced  meals. Choose foods such as meat, fish, milk and other low fat dairy products, vegetables, fruits, and whole-grain breads and cereals. Your caregiver will tell you of the ideal weight gain.  You can help morning sickness by keeping soda crackers at the bedside. Eat a couple before arising in the morning. You may want to use the crackers without salt on them.  Eating 4 to 5 small meals rather than 3 large meals a day also may help the nausea and vomiting.  Drinking liquids between meals instead of during meals also seems to help nausea and vomiting.  A physical sexual relationship may be continued throughout pregnancy if there are no other problems. Problems may be early (premature) leaking of amniotic fluid from the membranes, vaginal bleeding, or belly (abdominal) pain.  Exercise regularly if there are no restrictions. Check with your caregiver or physical therapist if you are unsure of the safety of some of your exercises. Greater weight gain will occur in the last 2 trimesters of pregnancy. Exercising will help:  Control your weight.  Keep you in shape.  Prepare you for labor and delivery.  Help you lose your pregnancy weight after you deliver your baby.  Wear a good support or jogging bra for breast tenderness during pregnancy. This may help if worn during sleep too.  Ask when prenatal classes are available. Begin classes when they are offered.  Do not use hot tubs, steam rooms, or saunas.  Wear your seat belt when driving. This protects you and your baby if you are in an accident.  Avoid raw meat, uncooked cheese, cat litter boxes, and soil used by cats throughout the pregnancy. These carry germs that can cause birth defects in the baby.  The first trimester is a good time to visit your dentist for your dental health. Getting your teeth cleaned is okay. Use a softer toothbrush and brush gently during pregnancy.  Ask for help if you have financial, counseling, or nutritional needs  during pregnancy. Your caregiver will be able to offer counseling for these needs as well as refer you for other special needs.  Do not take any medicines or herbs unless told by your caregiver.  Inform your caregiver if there is any mental or physical domestic violence.  Make a list of emergency phone numbers of family, friends, hospital, and police and fire departments.  Write down your questions. Take them to your prenatal visit.  Do not douche.  Do not cross your legs.  If you have to stand for long periods of time, rotate you feet or take small steps in a circle.  You may have more vaginal secretions that may require a sanitary pad. Do not use tampons or scented sanitary pads. MEDICINES AND DRUG USE IN PREGNANCY  Take prenatal vitamins as directed. The vitamin should contain 1 milligram of folic acid. Keep all vitamins out of reach of children. Only a couple vitamins or tablets containing iron may be fatal to a baby or young child when ingested.  Avoid use of all medicines, including herbs, over-the-counter medicines, not   prescribed or suggested by your caregiver. Only take over-the-counter or prescription medicines for pain, discomfort, or fever as directed by your caregiver. Do not use aspirin, ibuprofen, or naproxen unless directed by your caregiver.  Let your caregiver also know about herbs you may be using.  Alcohol is related to a number of birth defects. This includes fetal alcohol syndrome. All alcohol, in any form, should be avoided completely. Smoking will cause low birth rate and premature babies.  Street or illegal drugs are very harmful to the baby. They are absolutely forbidden. A baby born to an addicted mother will be addicted at birth. The baby will go through the same withdrawal an adult does.  Let your caregiver know about any medicines that you have to take and for what reason you take them. SEEK MEDICAL CARE IF:  You have any concerns or worries during your  pregnancy. It is better to call with your questions if you feel they cannot wait, rather than worry about them. SEEK IMMEDIATE MEDICAL CARE IF:   An unexplained oral temperature above 102 F (38.9 C) develops, or as your caregiver suggests.  You have leaking of fluid from the vagina (birth canal). If leaking membranes are suspected, take your temperature and inform your caregiver of this when you call.  There is vaginal spotting or bleeding. Notify your caregiver of the amount and how many pads are used.  You develop a bad smelling vaginal discharge with a change in the color.  You continue to feel sick to your stomach (nauseated) and have no relief from remedies suggested. You vomit blood or coffee ground-like materials.  You lose more than 2 pounds of weight in 1 week.  You gain more than 2 pounds of weight in 1 week and you notice swelling of your face, hands, feet, or legs.  You gain 5 pounds or more in 1 week (even if you do not have swelling of your hands, face, legs, or feet).  You get exposed to [place] measles and have never had them.  You are exposed to fifth disease or chickenpox.  You develop belly (abdominal) pain. Round ligament discomfort is a common non-cancerous (benign) cause of abdominal pain in pregnancy. Your caregiver still must evaluate this.  You develop headache, fever, diarrhea, pain with urination, or shortness of breath.  You fall or are in a car accident or have any kind of trauma.  There is mental or physical violence in your home. Document Released: 09/13/2001 Document Revised: 06/13/2012 Document Reviewed: 07/30/2013 [facility] Patient Information 2015 [place]ExitCare, [place]LL[place]. This information is not intended to replace advice given to you by your health care provider. Make sure you discuss any questions you have with your health care provider.  Migraine Headache A migraine headache is an intense, throbbing pain on one or both sides of your head. A migraine can  last for 30 minutes to several hours. CAUSES  The exact cause of a migraine headache is not always known. However, a migraine may be caused when nerves in the brain become irritated and release chemicals that cause inflammation. This causes pain. Certain things may also trigger migraines, such as:  Alcohol.  Smoking.  Stress.  Menstruation.  Aged cheeses.  Foods or drinks that contain nitrates, glutamate, aspartame, or tyramine.  Lack of sleep.  Chocolate.  Caffeine.  Hunger.  Physical exertion.  Fatigue.  Medicines used to treat chest pain (nitroglycerine), birth control pills, estrogen, and some blood pressure medicines. SIGNS AND SYMPTOMS  Pain on one  or both sides of your head.  Pulsating or throbbing pain.  Severe pain that prevents daily activities.  Pain that is aggravated by any physical activity.  Nausea, vomiting, or both.  Dizziness.  Pain with exposure to bright lights, loud noises, or activity.  General sensitivity to bright lights, loud noises, or smells. Before you get a migraine, you may get warning signs that a migraine is coming (aura). An aura may include:  Seeing flashing lights.  Seeing bright spots, halos, or zig-zag lines.  Having tunnel vision or blurred vision.  Having feelings of numbness or tingling.  Having trouble talking.  Having muscle weakness. DIAGNOSIS  A migraine headache is often diagnosed based on:  Symptoms.  Physical exam.  A CT scan or MRI of your head. These imaging tests cannot diagnose migraines, but they can help rule out other causes of headaches. TREATMENT Medicines may be given for pain and nausea. Medicines can also be given to help prevent recurrent migraines.  HOME CARE INSTRUCTIONS  Only take over-the-counter or prescription medicines for pain or discomfort as directed by your health care provider. The use of long-term narcotics is not recommended.  Lie down in a dark, quiet room when you have  a migraine.  Keep a journal to find out what may trigger your migraine headaches. For example, write down:  What you eat and drink.  How much sleep you get.  Any change to your diet or medicines.  Limit alcohol consumption.  Quit smoking if you smoke.  Get 7-9 hours of sleep, or as recommended by your health care provider.  Limit stress.  Keep lights dim if bright lights bother you and make your migraines worse. SEEK IMMEDIATE MEDICAL CARE IF:   Your migraine becomes severe.  You have a fever.  You have a stiff neck.  You have vision loss.  You have muscular weakness or loss of muscle control.  You start losing your balance or have trouble walking.  You feel faint or pass out.  You have severe symptoms that are different from your first symptoms. MAKE SURE YOU:   Understand these instructions.  Will watch your condition.  Will get help right away if you are not doing well or get worse. Document Released: 09/19/2005 Document Revised: 07/10/2013 Document Reviewed: 05/27/2013 [facility] Patient Information 2015 [place]ExitCare, [place]LLC. This information is not intended to replace advice given to you by your health care provider. Make sure you discuss any questions you have with your health care provider.

## 2014-04-01 LAB — MATERNAL SCREEN, INTEGRATED #1

## 2014-04-08 ENCOUNTER — Ambulatory Visit (INDEPENDENT_AMBULATORY_CARE_PROVIDER_SITE_OTHER): Payer: Self-pay | Admitting: Advanced Practice Midwife

## 2014-04-08 ENCOUNTER — Encounter: Payer: Self-pay | Admitting: Advanced Practice Midwife

## 2014-04-08 VITALS — BP 112/62 | Wt 252.5 lb

## 2014-04-08 DIAGNOSIS — Z331 Pregnant state, incidental: Secondary | ICD-10-CM

## 2014-04-08 DIAGNOSIS — Z1389 Encounter for screening for other disorder: Secondary | ICD-10-CM

## 2014-04-08 DIAGNOSIS — Z3480 Encounter for supervision of other normal pregnancy, unspecified trimester: Secondary | ICD-10-CM

## 2014-04-08 LAB — POCT URINALYSIS DIPSTICK
Blood, UA: NEGATIVE
GLUCOSE UA: NEGATIVE
Ketones, UA: NEGATIVE
Leukocytes, UA: NEGATIVE
NITRITE UA: NEGATIVE
Protein, UA: NEGATIVE

## 2014-04-08 NOTE — Progress Notes (Signed)
[facility]  Patient name: Kristin [name] MRN [id number]  Date of birth: 10/27/1980  CC & HPI:  Kristin [name] is a 33 y.o. African American female presenting today for recheck boil.  She is 14.[redacted] weeks pregnant, and dx with presumed MRSA (no culture) of boil on left labia.  Treated with cleocin.  Pertinent History Reviewed:  Medical & Surgical Hx:   Past Medical History  Diagnosis Date  . Migraines   . Vaginal Pap smear, abnormal   . Contraceptive management 11/05/2013   Past Surgical History  Procedure Laterality Date  . No past surgeries     Medications: Reviewed & Updated - see associated section Social History: Reviewed -  reports that she has never smoked. She has never used smokeless tobacco.  Objective Findings:  Vitals: BP 112/62  Wt 252 lb 8 oz (114.533 kg)  LMP 12/23/2013  Physical Examination: General appearance - alert, well appearing, and in no distress Mental status - alert, oriented to person, place, and time FHR:  149 on u/s Pelvic - Boil on right labia much improved, barely noticeable.  There is another one developing a few centimeters away on her inner thigh.  It is not visible, but there is a palpable 1/2 cm soft, non tender nodule.  Results for orders placed in visit on 04/08/14 (from the past 24 hour(s))  POCT URINALYSIS DIPSTICK   Collection Time    04/08/14 11:08 AM      Result Value Ref Range   Color, UA       Clarity, UA       Glucose, UA neg     Bilirubin, UA       Ketones, UA neg     Spec Grav, UA       Blood, UA neg     pH, UA       Protein, UA neg     Urobilinogen, UA       Nitrite, UA neg     Leukocytes, UA Negative       Assessment & Plan:  A:   Boil Left Labia (presumed MRSA), resolved  Developing boil on Left inner thigh P:  Warm compresses, finish abx.  F/U if it becomes inflammed  F/U 2 weeks for Low-risk ob appt and 2nd IT    CRESENZO-DISHMAN,Zariel Capano CNM 04/08/2014 11:23 AM

## 2014-04-22 ENCOUNTER — Ambulatory Visit (INDEPENDENT_AMBULATORY_CARE_PROVIDER_SITE_OTHER): Payer: Self-pay | Admitting: Women's Health

## 2014-04-22 ENCOUNTER — Encounter: Payer: Self-pay | Admitting: Women's Health

## 2014-04-22 VITALS — BP 104/60 | Wt 255.0 lb

## 2014-04-22 DIAGNOSIS — Z1389 Encounter for screening for other disorder: Secondary | ICD-10-CM

## 2014-04-22 DIAGNOSIS — Z331 Pregnant state, incidental: Secondary | ICD-10-CM

## 2014-04-22 DIAGNOSIS — Z3480 Encounter for supervision of other normal pregnancy, unspecified trimester: Secondary | ICD-10-CM

## 2014-04-22 DIAGNOSIS — Z3482 Encounter for supervision of other normal pregnancy, second trimester: Secondary | ICD-10-CM

## 2014-04-22 LAB — POCT URINALYSIS DIPSTICK
Blood, UA: NEGATIVE
Glucose, UA: NEGATIVE
KETONES UA: NEGATIVE
LEUKOCYTES UA: NEGATIVE
Nitrite, UA: NEGATIVE

## 2014-04-22 NOTE — Progress Notes (Signed)
Low-risk OB appointment [id number] 3275w2d Estimated Date of Delivery: 10/05/14 BP 104/60  Wt 255 lb (115.667 kg)  LMP 12/23/2013  BP, weight, and urine reviewed.  Refer to obstetrical flow sheet for FH & FHR.  Starting to feel some fm.  Denies regular uc's, lof, vb, or uti s/s. Some cramping. States boils have completely healed.  Reviewed warning s/s to report. Plan:  Continue routine obstetrical care  F/U in 4wks for OB appointment, anatomy u/s 2nd IT today

## 2014-04-22 NOTE — Patient Instructions (Signed)
Second Trimester of Pregnancy The second trimester is from week 13 through week 28, months 4 through 6. The second trimester is often a time when you feel your best. Your body has also adjusted to being pregnant, and you begin to feel better physically. Usually, morning sickness has lessened or quit completely, you may have more energy, and you may have an increase in appetite. The second trimester is also a time when the fetus is growing rapidly. At the end of the sixth month, the fetus is about 9 inches long and weighs about 1 pounds. You will likely begin to feel the baby move (quickening) between 18 and 20 weeks of the pregnancy. BODY CHANGES Your body goes through many changes during pregnancy. The changes vary from woman to woman.   Your weight will continue to increase. You will notice your lower abdomen bulging out.  You may begin to get stretch marks on your hips, abdomen, and breasts.  You may develop headaches that can be relieved by medicines approved by your health care provider.  You may urinate more often because the fetus is pressing on your bladder.  You may develop or continue to have heartburn as a result of your pregnancy.  You may develop constipation because certain hormones are causing the muscles that push waste through your intestines to slow down.  You may develop hemorrhoids or swollen, bulging veins (varicose veins).  You may have back pain because of the weight gain and pregnancy hormones relaxing your joints between the bones in your pelvis and as a result of a shift in weight and the muscles that support your balance.  Your breasts will continue to grow and be tender.  Your gums may bleed and may be sensitive to brushing and flossing.  Dark spots or blotches (chloasma, mask of pregnancy) may develop on your face. This will likely fade after the baby is born.  A dark line from your belly button to the pubic area (linea nigra) may appear. This will likely fade  after the baby is born.  You may have changes in your hair. These can include thickening of your hair, rapid growth, and changes in texture. Some women also have hair loss during or after pregnancy, or hair that feels dry or thin. Your hair will most likely return to normal after your baby is born. WHAT TO EXPECT AT YOUR PRENATAL VISITS During a routine prenatal visit:  You will be weighed to make sure you and the fetus are growing normally.  Your blood pressure will be taken.  Your abdomen will be measured to track your baby's growth.  The fetal heartbeat will be listened to.  Any test results from the previous visit will be discussed. Your health care provider may ask you:  How you are feeling.  If you are feeling the baby move.  If you have had any abnormal symptoms, such as leaking fluid, bleeding, severe headaches, or abdominal cramping.  If you have any questions. Other tests that may be performed during your second trimester include:  Blood tests that check for:  Low iron levels (anemia).  Gestational diabetes (between 24 and 28 weeks).  Rh antibodies.  Urine tests to check for infections, diabetes, or protein in the urine.  An ultrasound to confirm the proper growth and development of the baby.  An amniocentesis to check for possible genetic problems.  Fetal screens for spina bifida and Down syndrome. HOME CARE INSTRUCTIONS   Avoid all smoking, herbs, alcohol, and unprescribed   drugs. These chemicals affect the formation and growth of the baby.  Follow your health care provider's instructions regarding medicine use. There are medicines that are either safe or unsafe to take during pregnancy.  Exercise only as directed by your health care provider. Experiencing uterine cramps is a good sign to stop exercising.  Continue to eat regular, healthy meals.  Wear a good support bra for breast tenderness.  Do not use hot tubs, steam rooms, or saunas.  Wear your  seat belt at all times when driving.  Avoid raw meat, uncooked cheese, cat litter boxes, and soil used by cats. These carry germs that can cause birth defects in the baby.  Take your prenatal vitamins.  Try taking a stool softener (if your health care provider approves) if you develop constipation. Eat more high-fiber foods, such as fresh vegetables or fruit and whole grains. Drink plenty of fluids to keep your urine clear or pale yellow.  Take warm sitz baths to soothe any pain or discomfort caused by hemorrhoids. Use hemorrhoid cream if your health care provider approves.  If you develop varicose veins, wear support hose. Elevate your feet for 15 minutes, 3-4 times a day. Limit salt in your diet.  Avoid heavy lifting, wear low heel shoes, and practice good posture.  Rest with your legs elevated if you have leg cramps or low back pain.  Visit your dentist if you have not gone yet during your pregnancy. Use a soft toothbrush to brush your teeth and be gentle when you floss.  A sexual relationship may be continued unless your health care provider directs you otherwise.  Continue to go to all your prenatal visits as directed by your health care provider. SEEK MEDICAL CARE IF:   You have dizziness.  You have mild pelvic cramps, pelvic pressure, or nagging pain in the abdominal area.  You have persistent nausea, vomiting, or diarrhea.  You have a bad smelling vaginal discharge.  You have pain with urination. SEEK IMMEDIATE MEDICAL CARE IF:   You have a fever.  You are leaking fluid from your vagina.  You have spotting or bleeding from your vagina.  You have severe abdominal cramping or pain.  You have rapid weight gain or loss.  You have shortness of breath with chest pain.  You notice sudden or extreme swelling of your face, hands, ankles, feet, or legs.  You have not felt your baby move in over an hour.  You have severe headaches that do not go away with  medicine.  You have vision changes. Document Released: 09/13/2001 Document Revised: 09/24/2013 Document Reviewed: 11/20/2012 [employer] Patient Information 2015 [employer]. This information is not intended to replace advice given to you by your health care provider. Make sure you discuss any questions you have with your health care provider.  

## 2014-04-22 NOTE — Addendum Note (Signed)
Addended by: Gaylyn RongEVANS, Shaila Gilchrest A on: 04/22/2014 09:06 AM   Modules accepted: Orders

## 2014-04-30 LAB — MATERNAL SCREEN, INTEGRATED #2
AFP MoM: 1.11
AFP, Serum: 32.4 ng/mL
Calculated Gestational Age: 16.6
Crown Rump Length: 62.5 mm
ESTRIOL FREE MAT SCREEN: 0.74 ng/mL
ESTRIOL MOM MAT SCREEN: 0.94
HCG, MOM MAT SCREEN: 0.52
Inhibin A Dimeric: 63 pg/mL
Inhibin A MoM: 0.47
MSS Down Syndrome: <1:5000 {titer}
MSS Trisomy 18 Risk: <1:5000 {titer}
NT MoM: 1.04
NUCHAL TRANSLUCENCY MAT SCREEN 2: 1.48 mm
NUMBER OF FETUSES MAT SCREEN 2: 1
PAPP-A MAT SCREEN: 400 ng/mL
PAPP-A MoM: 0.71
Rish for ONTD: <1:5000 {titer}
hCG, Serum: 14.9 IU/mL

## 2014-05-06 ENCOUNTER — Encounter: Payer: Self-pay | Admitting: Women's Health

## 2014-05-06 ENCOUNTER — Ambulatory Visit (INDEPENDENT_AMBULATORY_CARE_PROVIDER_SITE_OTHER): Payer: Medicaid Other | Admitting: Women's Health

## 2014-05-06 VITALS — BP 112/64 | Wt 252.0 lb

## 2014-05-06 DIAGNOSIS — Z331 Pregnant state, incidental: Secondary | ICD-10-CM

## 2014-05-06 DIAGNOSIS — Z3482 Encounter for supervision of other normal pregnancy, second trimester: Secondary | ICD-10-CM

## 2014-05-06 DIAGNOSIS — R002 Palpitations: Secondary | ICD-10-CM

## 2014-05-06 DIAGNOSIS — Z1389 Encounter for screening for other disorder: Secondary | ICD-10-CM

## 2014-05-06 LAB — POCT URINALYSIS DIPSTICK
Blood, UA: NEGATIVE
Glucose, UA: NEGATIVE
KETONES UA: NEGATIVE
LEUKOCYTES UA: NEGATIVE
Nitrite, UA: NEGATIVE
PROTEIN UA: NEGATIVE

## 2014-05-06 NOTE — Patient Instructions (Signed)

## 2014-05-06 NOTE — Progress Notes (Signed)
   [facility]  Patient name: Kristin [name] MRN [id number]  Date of birth: 08/02/1981  CC & HPI:  Kristin [name] is a 33 y.o. African American female at 1161w2d presenting today as a work-in for heart palpitations that began on Saturday while she was sitting down relaxing in chair, intermittently x 1hr. Then happened again yesterday afternoon while sitting in chair at work. No sob, cp, dizziness, lightheadedness. Very minimal caffeine intake, never picked up fioricet so hasn't been taking them. Only on pnv and occ apap. No increased stress/anxiety. Doesn't smoke. Has never happened before. No h/o heart disease.   Pertinent History Reviewed:  Medical & Surgical Hx:   Past Medical History  Diagnosis Date  . Migraines   . Vaginal Pap smear, abnormal   . Contraceptive management 11/05/2013   Past Surgical History  Procedure Laterality Date  . No past surgeries     Medications: Reviewed & Updated - see associated section Social History: Reviewed -  reports that she has never smoked. She has never used smokeless tobacco.  Objective Findings:  Vitals: BP 112/64  Wt 252 lb (114.306 kg)  LMP 12/23/2013  Physical Examination: General appearance - alert, well appearing, and in no distress Chest - clear to auscultation, no wheezes, rales or rhonchi, symmetric air entry Heart - normal rate, regular rhythm, normal S1, S2, no murmurs, rubs, clicks or gallops FHR: 155  Results for orders placed in visit on 05/06/14 (from the past 24 hour(s))  POCT URINALYSIS DIPSTICK   Collection Time    05/06/14  8:50 AM      Result Value Ref Range   Color, UA       Clarity, UA       Glucose, UA neg     Bilirubin, UA       Ketones, UA neg     Spec Grav, UA       Blood, UA neg     pH, UA       Protein, UA neg     Urobilinogen, UA       Nitrite, UA neg     Leukocytes, UA Negative       Assessment & Plan:  A:   5261w2d SIUP  Heart palpitations during pregnancy P:  To monitor, if  increases in frequency or develops cp, sob, lightheadedness to call us or go to ED, otherwise if still present at next visit- will consider Holter monitor   Avoid caffeine, limit stress/anxiety  F/U as scheduled 8/27 for anatomy u/s and visit. Can't come any sooner d/t changing jobs and will be out of town training. If able to, will call back and schedule for earlier   Marge DuncansBooker, Feliberto Stockley Randall CNM, [place] Springs[place]NP-BC 05/06/2014 9:22 AM

## 2014-05-13 ENCOUNTER — Encounter: Payer: Medicaid Other | Admitting: Women's Health

## 2014-05-13 ENCOUNTER — Other Ambulatory Visit: Payer: Medicaid Other

## 2014-05-15 ENCOUNTER — Ambulatory Visit (INDEPENDENT_AMBULATORY_CARE_PROVIDER_SITE_OTHER): Payer: Medicaid Other

## 2014-05-15 ENCOUNTER — Ambulatory Visit (INDEPENDENT_AMBULATORY_CARE_PROVIDER_SITE_OTHER): Payer: Self-pay | Admitting: Advanced Practice Midwife

## 2014-05-15 ENCOUNTER — Other Ambulatory Visit: Payer: Self-pay | Admitting: Women's Health

## 2014-05-15 VITALS — BP 112/60 | Wt 255.0 lb

## 2014-05-15 DIAGNOSIS — Z3480 Encounter for supervision of other normal pregnancy, unspecified trimester: Secondary | ICD-10-CM

## 2014-05-15 DIAGNOSIS — O09522 Supervision of elderly multigravida, second trimester: Secondary | ICD-10-CM

## 2014-05-15 DIAGNOSIS — Z1389 Encounter for screening for other disorder: Secondary | ICD-10-CM

## 2014-05-15 DIAGNOSIS — Z331 Pregnant state, incidental: Secondary | ICD-10-CM

## 2014-05-15 DIAGNOSIS — Z3482 Encounter for supervision of other normal pregnancy, second trimester: Secondary | ICD-10-CM

## 2014-05-15 DIAGNOSIS — O09529 Supervision of elderly multigravida, unspecified trimester: Secondary | ICD-10-CM

## 2014-05-15 LAB — POCT URINALYSIS DIPSTICK
Blood, UA: NEGATIVE
GLUCOSE UA: NEGATIVE
KETONES UA: NEGATIVE
LEUKOCYTES UA: NEGATIVE
Nitrite, UA: NEGATIVE
Protein, UA: NEGATIVE

## 2014-05-15 NOTE — Progress Notes (Signed)
[id number] 5254w4d Estimated Date of Delivery: 10/05/14  Last menstrual period 12/23/2013, not currently breastfeeding.   BP weight and urine results all reviewed and noted.  Please refer to the obstetrical flow sheet for the fundal height and fetal heart rate documentation:  Anatomy scan normal except Lt EICF  Patient reports good fetal movement, denies any bleeding and no rupture of membranes symptoms or regular contractions. Patient is without complaints. All questions were answered.  Plan:  Continued routine obstetrical care, recheck EICF 28 weeks  Follow up in 4 weeks for OB appointment,

## 2014-05-15 NOTE — Progress Notes (Signed)
U/S(19+4wks)-single active fetus, meas c/w dates, fluid wnl, anterior Gr 0 placenta, cx appears closed (3.3cm), bilateral adnexa appears WNL, FHR-150 bpm, Lt Vent Eicf noted although all other cardiac structures appear NL, no other abnl noted, female fetus

## 2014-05-20 ENCOUNTER — Other Ambulatory Visit: Payer: Medicaid Other

## 2014-05-20 ENCOUNTER — Encounter: Payer: Medicaid Other | Admitting: Women's Health

## 2014-05-26 ENCOUNTER — Encounter: Payer: Medicaid Other | Admitting: Women's Health

## 2014-05-29 ENCOUNTER — Encounter: Payer: Medicaid Other | Admitting: Advanced Practice Midwife

## 2014-05-29 ENCOUNTER — Other Ambulatory Visit: Payer: Medicaid Other

## 2014-06-12 ENCOUNTER — Ambulatory Visit (INDEPENDENT_AMBULATORY_CARE_PROVIDER_SITE_OTHER): Payer: Medicaid Other | Admitting: Advanced Practice Midwife

## 2014-06-12 ENCOUNTER — Encounter: Payer: Self-pay | Admitting: Advanced Practice Midwife

## 2014-06-12 VITALS — BP 104/60 | Wt 254.0 lb

## 2014-06-12 DIAGNOSIS — Z3480 Encounter for supervision of other normal pregnancy, unspecified trimester: Secondary | ICD-10-CM

## 2014-06-12 DIAGNOSIS — Z3482 Encounter for supervision of other normal pregnancy, second trimester: Secondary | ICD-10-CM

## 2014-06-12 DIAGNOSIS — Z331 Pregnant state, incidental: Secondary | ICD-10-CM

## 2014-06-12 DIAGNOSIS — Z1389 Encounter for screening for other disorder: Secondary | ICD-10-CM

## 2014-06-12 LAB — POCT URINALYSIS DIPSTICK
Blood, UA: NEGATIVE
GLUCOSE UA: NEGATIVE
Ketones, UA: NEGATIVE
Leukocytes, UA: NEGATIVE
NITRITE UA: NEGATIVE
Protein, UA: NEGATIVE

## 2014-06-12 NOTE — Progress Notes (Signed)
[id number] [redacted]w[redacted]d Estimated Date of Delivery: 10/05/14  Blood pressure 104/60, weight 254 lb (115.214 kg), last menstrual period 12/23/2013, not currently breastfeeding.   BP weight and urine results all reviewed and noted.  Please refer to the obstetrical flow sheet for the fundal height and fetal heart rate documentation:  Patient reports good fetal movement, denies any bleeding and no rupture of membranes symptoms or regular contractions. Patient is without complaints. All questions were answered.  Plan:  Continued routine obstetrical care,   Follow up in 4 weeks for OB appointment, PN2, recheck EICF

## 2014-06-12 NOTE — Progress Notes (Signed)
Pt denies any problems or concerns at this time.  

## 2014-06-12 NOTE — Patient Instructions (Signed)
1. Before your test, do not eat or drink anything for 8-10 hours prior to your  appointment (a small amount of water is allowed and you may take any medicines you normally take). Be sure to drink lots of water the day before. 2. When you arrive, your blood will be drawn for a 'fasting' blood sugar level.  Then you will be given a sweetened carbonated beverage to drink. You should  complete drinking this beverage within five minutes. After finishing the  beverage, you will have your blood drawn exactly 1 and 2 hours later. Having  your blood drawn on time is an important part of this test. A total of three blood  samples will be done. 3. The test takes approximately 2  hours. During the test, do not have anything to  eat or drink. Do not smoke, chew gum (not even sugarless gum) or use breath mints.  4. During the test you should remain close by and seated as much as possible and  avoid walking around. You may want to bring a book or something else to  occupy your time.  5. After your test, you may eat and drink as normal. You may want to bring a snack  to eat after the test is finished. Your provider will advise you as to the results of  this test and any follow-up if necessary  You will also be retested for syphilis, HIV and blood levels (anemia):  You were already tested in the first trimester, but [place] recommends retesting.  Additionally, you will be tested for Type 2 Herpes. MOST people do not know that they have genital herpes, as only around 15% of people have outbreaks.  However, it is still transmittable to other people, including the baby (but only during the birth).  If you test positive for Type 2 Herpes, we place you on a medicine called acyclovir the last 6 weeks of your pregnancy to prevent transmission of the virus to the baby during the birth.    If your sugar test is positive for gestational diabetes, you will be given an phone call and further instructions discussed.   We typically do not call patients with positive herpes results, but will discuss it at your next appointment.  If you wish to know all of your test results before your next appointment, feel free to call the office, or look up your test results on Mychart.  (The range that the lab uses for normal values of the sugar test are not necessarily the range that is used for pregnant women; if your results are within the range, they are definitely normal.  However, if a value is deemed "high" by the lab, it may not be too high for a pregnant woman.  We will need to discuss the normal range if your value(s) fall in the "high" category).     Sometime between 27 and 36 weeks, it is recommended that you and anyone who is going to be in close contact with your baby receive the Tdap booster.  You should receive it EACH pregnancy, regardless of when your last booster was.  You may go to the Health Department (no appointment necessary) or your Primary Care office to receive the vaccine.  If you do not receive the vaccine prior to delivery, it will be offered in the hospital.  However, if you get it at least 2 weeks prior to delivery, you will have the added advantage of passing the immunity to your baby.   

## 2014-06-30 ENCOUNTER — Other Ambulatory Visit: Payer: Self-pay | Admitting: Obstetrics & Gynecology

## 2014-07-07 ENCOUNTER — Telehealth: Payer: Self-pay | Admitting: Women's Health

## 2014-07-07 NOTE — Telephone Encounter (Signed)
Pt c/o scratchy throat, sinus drainage, nasal congestion, possible chest congestion, nasal drainage "clear", no fever. Informed pt can take OTC Robitussin, Tylenol if fever, gargle with warm salt water, lozenges. If no improvement call our office back. Also offered pt an appt for possible sinus infection, pt stated would keep her appt for Thursday, Oct. 8,2015.

## 2014-07-10 ENCOUNTER — Ambulatory Visit (INDEPENDENT_AMBULATORY_CARE_PROVIDER_SITE_OTHER): Payer: Medicaid Other | Admitting: Adult Health

## 2014-07-10 ENCOUNTER — Ambulatory Visit (INDEPENDENT_AMBULATORY_CARE_PROVIDER_SITE_OTHER): Payer: Medicaid Other

## 2014-07-10 ENCOUNTER — Encounter: Payer: Self-pay | Admitting: Adult Health

## 2014-07-10 ENCOUNTER — Other Ambulatory Visit: Payer: Medicaid Other

## 2014-07-10 ENCOUNTER — Other Ambulatory Visit: Payer: Self-pay | Admitting: Advanced Practice Midwife

## 2014-07-10 VITALS — BP 118/62 | Wt 253.0 lb

## 2014-07-10 DIAGNOSIS — O09522 Supervision of elderly multigravida, second trimester: Secondary | ICD-10-CM

## 2014-07-10 DIAGNOSIS — Z3482 Encounter for supervision of other normal pregnancy, second trimester: Secondary | ICD-10-CM

## 2014-07-10 DIAGNOSIS — O35BXX1 Maternal care for other (suspected) fetal abnormality and damage, fetal cardiac anomalies, fetus 1: Secondary | ICD-10-CM

## 2014-07-10 DIAGNOSIS — Z113 Encounter for screening for infections with a predominantly sexual mode of transmission: Secondary | ICD-10-CM

## 2014-07-10 DIAGNOSIS — O358XX1 Maternal care for other (suspected) fetal abnormality and damage, fetus 1: Secondary | ICD-10-CM

## 2014-07-10 DIAGNOSIS — Z131 Encounter for screening for diabetes mellitus: Secondary | ICD-10-CM

## 2014-07-10 DIAGNOSIS — Z1389 Encounter for screening for other disorder: Secondary | ICD-10-CM

## 2014-07-10 DIAGNOSIS — Z331 Pregnant state, incidental: Secondary | ICD-10-CM

## 2014-07-10 DIAGNOSIS — Z114 Encounter for screening for human immunodeficiency virus [HIV]: Secondary | ICD-10-CM

## 2014-07-10 DIAGNOSIS — Z0184 Encounter for antibody response examination: Secondary | ICD-10-CM

## 2014-07-10 LAB — POCT URINALYSIS DIPSTICK
Blood, UA: NEGATIVE
Glucose, UA: NEGATIVE
Ketones, UA: NEGATIVE
Nitrite, UA: NEGATIVE
Protein, UA: NEGATIVE

## 2014-07-10 LAB — CBC
HCT: 32.2 % — ABNORMAL LOW (ref 36.0–46.0)
Hemoglobin: 10.8 g/dL — ABNORMAL LOW (ref 12.0–15.0)
MCH: 27.3 pg (ref 26.0–34.0)
MCHC: 33.5 g/dL (ref 30.0–36.0)
MCV: 81.3 fL (ref 78.0–100.0)
PLATELETS: 230 10*3/uL (ref 150–400)
RBC: 3.96 MIL/uL (ref 3.87–5.11)
RDW: 14.4 % (ref 11.5–15.5)
WBC: 6.9 10*3/uL (ref 4.0–10.5)

## 2014-07-10 LAB — RPR

## 2014-07-10 NOTE — Patient Instructions (Signed)
Sterilization Information, Female Female sterilization is a procedure to permanently prevent pregnancy. There are different ways to perform sterilization, but all either block or close the fallopian tubes so that your eggs cannot reach your uterus. If your egg cannot reach your uterus, sperm cannot fertilize the egg, and you cannot get pregnant.  Sterilization is performed by a surgical procedure. Sometimes these procedures are performed in a hospital while a patient is asleep. Sometimes they can be done in a clinic setting with the patient awake. The fallopian tubes can be surgically cut, tied, or sealed through a procedure called tubal ligation. The fallopian tubes can also be closed with clips or rings. Sterilization can also be done by placing a tiny coil into each fallopian tube, which causes scar tissue to grow inside the tube. The scar tissue then blocks the tubes.  Discuss sterilization with your caregiver to answer any concerns you or your partner may have. You may want to ask what type of sterilization your caregiver performs. Some caregivers may not perform all the various options. Sterilization is permanent and should only be done if you are sure you do not want children or do not want any more children. Having a sterilization reversed may not be successful.  STERILIZATION PROCEDURES  Laparoscopic sterilization. This is a surgical method performed at a time other than right after childbirth. Two incisions are made in the lower abdomen. A thin, lighted tube (laparoscope) is inserted into one of the incisions and is used to perform the procedure. The fallopian tubes are closed with a ring or a clip. An instrument that uses heat could be used to seal the tubes closed (electrocautery).   Mini-laparotomy. This is a surgical method done 1 or 2 days after giving birth. Typically, a small incision is made just below the belly button (umbilicus) and the fallopian tubes are exposed. The tubes can then be  sealed, tied, or cut.   Hysteroscopic sterilization. This is performed at a time other than right after childbirth. A tiny, spring-like coil is inserted through the cervix and uterus and placed into the fallopian tubes. The coil causes scaring and blocks the tubes. Other forms of contraception should be used for 3 months after the procedure to allow the scar tissue to form completely. Additionally, it is required hysterosalpingography be done 3 months later to ensure that the procedure was successful. Hysterosalpingography is a procedure that uses X-rays to look at your uterus and fallopian tubes after a material to make them show up better has been inserted. IS STERILIZATION SAFE? Sterilization is considered safe with very rare complications. Risks depend on the type of procedure you have. As with any surgical procedure, there are risks. Some risks of sterilization by any means include:   Bleeding.  Infection.  Reaction to anesthesia medicine.  Injury to surrounding organs. Risks specific to having hysteroscopic coils placed include:  The coils may not be placed correctly the first time.   The coils may move out of place.   The tubes may not get completely blocked after 3 months.   Injury to surrounding organs when placing the coil.  HOW EFFECTIVE IS FEMALE STERILIZATION? Sterilization is nearly 100% effective, but it can fail. Depending on the type of sterilization, the rate of failure can be as high as 3%. After hysteroscopic sterilization with placement of fallopian tube coils, you will need back-up birth control for 3 months after the procedure. Sterilization is effective for a lifetime.  BENEFITS OF STERILIZATION  It does   not affect your hormones, and therefore will not affect your menstrual periods, sexual desire, or performance.   It is effective for a lifetime.   It is safe.   You do not need to worry about getting pregnant. Keep in mind that if you had the  hysteroscopic placement procedure, you must wait 3 months after the procedure (or until your caregiver confirms) before pregnancy is not considered possible.   There are no side effects unlike other types of birth control (contraception).  DRAWBACKS OF STERILIZATION  You must be sure you do not want children or any more children. The procedure is permanent.   It does not provide protection against sexually transmitted infections (STIs).   The tubes can grow back together. If this happens, there is a risk of pregnancy. There is also an increased risk (50%) of pregnancy being an ectopic pregnancy. This is a pregnancy that happens outside of the uterus. Document Released: 03/07/2008 Document Revised: 09/24/2013 Document Reviewed: 01/05/2012 Exit[employer] Patient Information 2015 [employer]. This information is not intended to replace advice given to you by your health care provider. Make sure you discuss any questions you have with your health care provider. Third Trimester of Pregnancy The third trimester is from week 29 through week 42, months 7 through 9. The third trimester is a time when the fetus is growing rapidly. At the end of the ninth month, the fetus is about 20 inches in length and weighs 6-10 pounds.  BODY CHANGES Your body goes through many changes during pregnancy. The changes vary from woman to woman.   Your weight will continue to increase. You can expect to gain 25-35 pounds (11-16 kg) by the end of the pregnancy.  You may begin to get stretch marks on your hips, abdomen, and breasts.  You may urinate more often because the fetus is moving lower into your pelvis and pressing on your bladder.  You may develop or continue to have heartburn as a result of your pregnancy.  You may develop constipation because certain hormones are causing the muscles that push waste through your intestines to slow down.  You may develop hemorrhoids or swollen, bulging veins (varicose  veins).  You may have pelvic pain because of the weight gain and pregnancy hormones relaxing your joints between the bones in your pelvis. Backaches may result from overexertion of the muscles supporting your posture.  You may have changes in your hair. These can include thickening of your hair, rapid growth, and changes in texture. Some women also have hair loss during or after pregnancy, or hair that feels dry or thin. Your hair will most likely return to normal after your baby is born.  Your breasts will continue to grow and be tender. A yellow discharge may leak from your breasts called colostrum.  Your belly button may stick out.  You may feel short of breath because of your expanding uterus.  You may notice the fetus "dropping," or moving lower in your abdomen.  You may have a bloody mucus discharge. This usually occurs a few days to a week before labor begins.  Your cervix becomes thin and soft (effaced) near your due date. WHAT TO EXPECT AT YOUR PRENATAL EXAMS  You will have prenatal exams every 2 weeks until week 36. Then, you will have weekly prenatal exams. During a routine prenatal visit:  You will be weighed to make sure you and the fetus are growing normally.  Your blood pressure is taken.  Your abdomen will   be measured to track your baby's growth.  The fetal heartbeat will be listened to.  Any test results from the previous visit will be discussed.  You may have a cervical check near your due date to see if you have effaced. At around 36 weeks, your caregiver will check your cervix. At the same time, your caregiver will also perform a test on the secretions of the vaginal tissue. This test is to determine if a type of bacteria, Group B streptococcus, is present. Your caregiver will explain this further. Your caregiver may ask you:  What your birth plan is.  How you are feeling.  If you are feeling the baby move.  If you have had any abnormal symptoms, such as  leaking fluid, bleeding, severe headaches, or abdominal cramping.  If you have any questions. Other tests or screenings that may be performed during your third trimester include:  Blood tests that check for low iron levels (anemia).  Fetal testing to check the health, activity level, and growth of the fetus. Testing is done if you have certain medical conditions or if there are problems during the pregnancy. FALSE LABOR You may feel small, irregular contractions that eventually go away. These are called Braxton Hicks contractions, or false labor. Contractions may last for hours, days, or even weeks before true labor sets in. If contractions come at regular intervals, intensify, or become painful, it is best to be seen by your caregiver.  SIGNS OF LABOR   Menstrual-like cramps.  Contractions that are 5 minutes apart or less.  Contractions that start on the top of the uterus and spread down to the lower abdomen and back.  A sense of increased pelvic pressure or back pain.  A watery or bloody mucus discharge that comes from the vagina. If you have any of these signs before the 37th week of pregnancy, call your caregiver right away. You need to go to the hospital to get checked immediately. HOME CARE INSTRUCTIONS   Avoid all smoking, herbs, alcohol, and unprescribed drugs. These chemicals affect the formation and growth of the baby.  Follow your caregiver's instructions regarding medicine use. There are medicines that are either safe or unsafe to take during pregnancy.  Exercise only as directed by your caregiver. Experiencing uterine cramps is a good sign to stop exercising.  Continue to eat regular, healthy meals.  Wear a good support bra for breast tenderness.  Do not use hot tubs, steam rooms, or saunas.  Wear your seat belt at all times when driving.  Avoid raw meat, uncooked cheese, cat litter boxes, and soil used by cats. These carry germs that can cause birth defects in the  baby.  Take your prenatal vitamins.  Try taking a stool softener (if your caregiver approves) if you develop constipation. Eat more high-fiber foods, such as fresh vegetables or fruit and whole grains. Drink plenty of fluids to keep your urine clear or pale yellow.  Take warm sitz baths to soothe any pain or discomfort caused by hemorrhoids. Use hemorrhoid cream if your caregiver approves.  If you develop varicose veins, wear support hose. Elevate your feet for 15 minutes, 3-4 times a day. Limit salt in your diet.  Avoid heavy lifting, wear low heal shoes, and practice good posture.  Rest a lot with your legs elevated if you have leg cramps or low back pain.  Visit your dentist if you have not gone during your pregnancy. Use a soft toothbrush to brush your teeth and be   gentle when you floss.  A sexual relationship may be continued unless your caregiver directs you otherwise.  Do not travel far distances unless it is absolutely necessary and only with the approval of your caregiver.  Take prenatal classes to understand, practice, and ask questions about the labor and delivery.  Make a trial run to the hospital.  Pack your hospital bag.  Prepare the baby's nursery.  Continue to go to all your prenatal visits as directed by your caregiver. SEEK MEDICAL CARE IF:  You are unsure if you are in labor or if your water has broken.  You have dizziness.  You have mild pelvic cramps, pelvic pressure, or nagging pain in your abdominal area.  You have persistent nausea, vomiting, or diarrhea.  You have a bad smelling vaginal discharge.  You have pain with urination. SEEK IMMEDIATE MEDICAL CARE IF:   You have a fever.  You are leaking fluid from your vagina.  You have spotting or bleeding from your vagina.  You have severe abdominal cramping or pain.  You have rapid weight loss or gain.  You have shortness of breath with chest pain.  You notice sudden or extreme swelling of  your face, hands, ankles, feet, or legs.  You have not felt your baby move in over an hour.  You have severe headaches that do not go away with medicine.  You have vision changes. Document Released: 09/13/2001 Document Revised: 09/24/2013 Document Reviewed: 11/20/2012 Exit[employer] Patient Information 2015 [employer]. This information is not intended to replace advice given to you by your health care provider. Make sure you discuss any questions you have with your health care provider.  

## 2014-07-10 NOTE — Progress Notes (Signed)
U/S(27+4wks)-active fetus, appropriate growth EFw 2 lb 8 oz (55th%tile), anterior Gr 0 placenta, cx appears closed (3.2cm), bilateral adnexa appears WNL, FHR-141 bpm, fluid WNL SDP-5.1cm, Lt Vent EICF noted again on today's exam although not as prominent, no other abnl noted on today's exam, female fetus

## 2014-07-10 NOTE — Progress Notes (Signed)
[id number] 3132w4d Estimated Date of Delivery: 10/05/14  Blood pressure 118/62, weight 253 lb (114.76 kg), last menstrual period 12/23/2013, not currently breastfeeding.   BP weight and urine results all reviewed and noted.  Please refer to the obstetrical flow sheet for the fundal height and fetal heart rate documentation:FH 30 cm FHR 141 by US  Patient reports good fetal movement, denies any bleeding and no rupture of membranes symptoms or regular contractions. Patient is without complaints. All questions were answered. Had US EIFC present but less prominent in left ventricle, discussed with Dr Despina HiddenEure, no follow up needed. Want sBTL, will sign papers today.  Plan:  Continued routine obstetrical care,   Follow up in 3 weeks for OB appointment, see Dr Despina HiddenEure

## 2014-07-11 LAB — GLUCOSE TOLERANCE, 2 HOURS W/ 1HR
GLUCOSE: 101 mg/dL (ref 70–170)
Glucose, 2 hour: 94 mg/dL (ref 70–139)
Glucose, Fasting: 80 mg/dL (ref 70–99)

## 2014-07-11 LAB — HIV ANTIBODY (ROUTINE TESTING W REFLEX): HIV 1&2 Ab, 4th Generation: NONREACTIVE

## 2014-07-11 LAB — HSV 2 ANTIBODY, IGG

## 2014-07-11 LAB — ANTIBODY SCREEN: Antibody Screen: NEGATIVE

## 2014-07-14 ENCOUNTER — Encounter: Payer: Self-pay | Admitting: Advanced Practice Midwife

## 2014-07-28 ENCOUNTER — Encounter: Payer: Self-pay | Admitting: Obstetrics & Gynecology

## 2014-07-28 ENCOUNTER — Ambulatory Visit (INDEPENDENT_AMBULATORY_CARE_PROVIDER_SITE_OTHER): Payer: Medicaid Other | Admitting: Obstetrics & Gynecology

## 2014-07-28 VITALS — BP 100/60 | Wt 257.0 lb

## 2014-07-28 DIAGNOSIS — Z1389 Encounter for screening for other disorder: Secondary | ICD-10-CM

## 2014-07-28 DIAGNOSIS — Z331 Pregnant state, incidental: Secondary | ICD-10-CM

## 2014-07-28 DIAGNOSIS — Z3483 Encounter for supervision of other normal pregnancy, third trimester: Secondary | ICD-10-CM

## 2014-07-28 LAB — POCT URINALYSIS DIPSTICK
Blood, UA: NEGATIVE
Glucose, UA: NEGATIVE
KETONES UA: NEGATIVE
LEUKOCYTES UA: NEGATIVE
NITRITE UA: NEGATIVE
PROTEIN UA: NEGATIVE

## 2014-07-28 MED ORDER — SULFAMETHOXAZOLE-TMP DS 800-160 MG PO TABS: 1.0000 | ORAL_TABLET | Freq: Two times a day (BID) | ORAL | Status: DC

## 2014-07-28 NOTE — Progress Notes (Signed)
[id number] 2663w1d Estimated Date of Delivery: 10/05/14  Blood pressure 100/60, weight 257 lb (116.574 kg), last menstrual period 12/23/2013, not currently breastfeeding.   BP weight and urine results all reviewed and noted.  Please refer to the obstetrical flow sheet for the fundal height and fetal heart rate documentation:  Patient reports good fetal movement, denies any bleeding and no rupture of membranes symptoms or regular contractions. Patient is without complaints. All questions were answered.  Plan:  Continued routine obstetrical care,   Follow up in 2 weeks for OB appointment, routine

## 2014-07-31 ENCOUNTER — Encounter: Payer: Medicaid Other | Admitting: Obstetrics & Gynecology

## 2014-08-04 ENCOUNTER — Encounter: Payer: Self-pay | Admitting: Obstetrics & Gynecology

## 2014-08-11 ENCOUNTER — Ambulatory Visit: Payer: Medicaid Other | Admitting: Women's Health

## 2014-08-11 ENCOUNTER — Encounter: Payer: Self-pay | Admitting: Women's Health

## 2014-08-11 ENCOUNTER — Ambulatory Visit (INDEPENDENT_AMBULATORY_CARE_PROVIDER_SITE_OTHER): Payer: Medicaid Other | Admitting: Women's Health

## 2014-08-11 ENCOUNTER — Ambulatory Visit (INDEPENDENT_AMBULATORY_CARE_PROVIDER_SITE_OTHER): Payer: Medicaid Other | Admitting: *Deleted

## 2014-08-11 VITALS — BP 118/62 | Wt 254.0 lb

## 2014-08-11 DIAGNOSIS — Z1389 Encounter for screening for other disorder: Secondary | ICD-10-CM

## 2014-08-11 DIAGNOSIS — Z3483 Encounter for supervision of other normal pregnancy, third trimester: Secondary | ICD-10-CM

## 2014-08-11 DIAGNOSIS — Z331 Pregnant state, incidental: Secondary | ICD-10-CM

## 2014-08-11 DIAGNOSIS — Z23 Encounter for immunization: Secondary | ICD-10-CM

## 2014-08-11 DIAGNOSIS — O09523 Supervision of elderly multigravida, third trimester: Secondary | ICD-10-CM

## 2014-08-11 DIAGNOSIS — O26843 Uterine size-date discrepancy, third trimester: Secondary | ICD-10-CM

## 2014-08-11 LAB — POCT URINALYSIS DIPSTICK
Blood, UA: NEGATIVE
GLUCOSE UA: NEGATIVE
Ketones, UA: NEGATIVE
Leukocytes, UA: NEGATIVE
Nitrite, UA: NEGATIVE
Protein, UA: NEGATIVE

## 2014-08-11 NOTE — Patient Instructions (Signed)
Call the office ([phone]) or go to [facility] if:  You begin to have strong, frequent contractions  Your water breaks.  Sometimes it is a big gush of fluid, sometimes it is just a trickle that keeps getting your panties wet or running down your legs  You have vaginal bleeding.  It is normal to have a small amount of spotting if your cervix was checked.   You don't feel your baby moving like normal.  If you don't, get you something to eat and drink and lay down and focus on feeling your baby move.  You should feel at least 10 movements in 2 hours.  If you don't, you should call the office or go to [facility].    Preterm Labor Information Preterm labor is when labor starts at less than 37 weeks of pregnancy. The normal length of a pregnancy is 39 to 41 weeks. CAUSES Often, there is no identifiable underlying cause as to why a woman goes into preterm labor. One of the most common known causes of preterm labor is infection. Infections of the uterus, cervix, vagina, amniotic sac, bladder, kidney, or even the lungs (pneumonia) can cause labor to start. Other suspected causes of preterm labor include:   Urogenital infections, such as yeast infections and bacterial vaginosis.   Uterine abnormalities (uterine shape, uterine septum, fibroids, or bleeding from the placenta).   A cervix that has been operated on (it may fail to stay closed).   Malformations in the fetus.   Multiple gestations (twins, triplets, and so on).   Breakage of the amniotic sac.  RISK FACTORS  Having a previous history of preterm labor.   Having premature rupture of membranes (PROM).   Having a placenta that covers the opening of the cervix (placenta previa).   Having a placenta that separates from the uterus (placental abruption).   Having a cervix that is too weak to hold the fetus in the uterus (incompetent cervix).   Having too much fluid in the amniotic sac (polyhydramnios).   Taking  illegal drugs or smoking while pregnant.   Not gaining enough weight while pregnant.   Being younger than 18 and older than 35 years old.   Having a low socioeconomic status.   Being African American. SYMPTOMS Signs and symptoms of preterm labor include:   Menstrual-like cramps, abdominal pain, or back pain.  Uterine contractions that are regular, as frequent as six in an hour, regardless of their intensity (may be mild or painful).  Contractions that start on the top of the uterus and spread down to the lower abdomen and back.   A sense of increased pelvic pressure.   A watery or bloody mucus discharge that comes from the vagina.  TREATMENT Depending on the length of the pregnancy and other circumstances, your health care provider may suggest bed rest. If necessary, there are medicines that can be given to stop contractions and to mature the fetal lungs. If labor happens before 34 weeks of pregnancy, a prolonged hospital stay may be recommended. Treatment depends on the condition of both you and the fetus.  WHAT SHOULD YOU DO IF YOU THINK YOU ARE IN PRETERM LABOR? Call your health care provider right away. You will need to go to the hospital to get checked immediately. HOW CAN YOU PREVENT PRETERM LABOR IN FUTURE PREGNANCIES? You should:   Stop smoking if you smoke.  Maintain healthy weight gain and avoid chemicals and drugs that are not necessary.  Be watchful for   any type of infection.  Inform your health care provider if you have a known history of preterm labor. Document Released: 12/10/2003 Document Revised: 05/22/2013 Document Reviewed: 10/22/2012 Exit[employer] Patient Information 2015 [employer]. This information is not intended to replace advice given to you by your health care provider. Make sure you discuss any questions you have with your health care provider.  

## 2014-08-11 NOTE — Progress Notes (Signed)
Low-risk OB appointment [id number] 6819w1d Estimated Date of Delivery: 10/05/14 BP 118/62 mmHg  Wt 254 lb (115.214 kg)  LMP 12/23/2013  BP, weight, and urine reviewed.  Refer to obstetrical flow sheet for FH & FHR.  Reports good fm.  Denies regular uc's, lof, vb, or uti s/s. Had lots of cramping on Sat, went away by Sun.  FH 37cm, also ama @ 33yo- will check growth/afi/bpp/dopp/recheck EICF u/s Reviewed ptl s/s, fkc. Plan:  Continue routine obstetrical care  F/U asap for u/s, then 2wks for OB appointment  Flu shot today

## 2014-08-12 ENCOUNTER — Ambulatory Visit (INDEPENDENT_AMBULATORY_CARE_PROVIDER_SITE_OTHER): Payer: Medicaid Other

## 2014-08-12 ENCOUNTER — Other Ambulatory Visit: Payer: Self-pay | Admitting: Women's Health

## 2014-08-12 DIAGNOSIS — O09523 Supervision of elderly multigravida, third trimester: Secondary | ICD-10-CM

## 2014-08-12 DIAGNOSIS — O358XX1 Maternal care for other (suspected) fetal abnormality and damage, fetus 1: Secondary | ICD-10-CM

## 2014-08-12 DIAGNOSIS — O35BXX1 Maternal care for other (suspected) fetal abnormality and damage, fetal cardiac anomalies, fetus 1: Secondary | ICD-10-CM

## 2014-08-12 DIAGNOSIS — O09521 Supervision of elderly multigravida, first trimester: Secondary | ICD-10-CM

## 2014-08-12 DIAGNOSIS — O26843 Uterine size-date discrepancy, third trimester: Secondary | ICD-10-CM

## 2014-08-12 NOTE — Progress Notes (Signed)
U/S(32+2wks)-vtx active fetus, BPP 8/8, fluid WNL AFI-12.2cm SDP-4.8cm, FHR-164 bpm, UA Doppler RI-0.65 & 0.63, EFw 4 lb 13 oz (63rd%tile), Lt Vent EICF remains, anterior Gr 0 placenta

## 2014-08-15 LAB — US OB FOLLOW UP

## 2014-08-25 ENCOUNTER — Ambulatory Visit (INDEPENDENT_AMBULATORY_CARE_PROVIDER_SITE_OTHER): Payer: Medicaid Other | Admitting: Women's Health

## 2014-08-25 ENCOUNTER — Encounter: Payer: Self-pay | Admitting: Women's Health

## 2014-08-25 VITALS — BP 112/62 | Wt 256.0 lb

## 2014-08-25 DIAGNOSIS — Z3493 Encounter for supervision of normal pregnancy, unspecified, third trimester: Secondary | ICD-10-CM

## 2014-08-25 DIAGNOSIS — Z1389 Encounter for screening for other disorder: Secondary | ICD-10-CM

## 2014-08-25 DIAGNOSIS — Z331 Pregnant state, incidental: Secondary | ICD-10-CM

## 2014-08-25 LAB — POCT URINALYSIS DIPSTICK
GLUCOSE UA: NEGATIVE
Ketones, UA: NEGATIVE
Leukocytes, UA: NEGATIVE
Nitrite, UA: NEGATIVE
Protein, UA: NEGATIVE
RBC UA: NEGATIVE

## 2014-08-25 NOTE — Patient Instructions (Signed)
Call the office ([phone]) or go to [facility] if:  You begin to have strong, frequent contractions  Your water breaks.  Sometimes it is a big gush of fluid, sometimes it is just a trickle that keeps getting your panties wet or running down your legs  You have vaginal bleeding.  It is normal to have a small amount of spotting if your cervix was checked.   You don't feel your baby moving like normal.  If you don't, get you something to eat and drink and lay down and focus on feeling your baby move.  You should feel at least 10 movements in 2 hours.  If you don't, you should call the office or go to [facility].    Preterm Labor Information Preterm labor is when labor starts at less than 37 weeks of pregnancy. The normal length of a pregnancy is 39 to 41 weeks. CAUSES Often, there is no identifiable underlying cause as to why a woman goes into preterm labor. One of the most common known causes of preterm labor is infection. Infections of the uterus, cervix, vagina, amniotic sac, bladder, kidney, or even the lungs (pneumonia) can cause labor to start. Other suspected causes of preterm labor include:   Urogenital infections, such as yeast infections and bacterial vaginosis.   Uterine abnormalities (uterine shape, uterine septum, fibroids, or bleeding from the placenta).   A cervix that has been operated on (it may fail to stay closed).   Malformations in the fetus.   Multiple gestations (twins, triplets, and so on).   Breakage of the amniotic sac.  RISK FACTORS  Having a previous history of preterm labor.   Having premature rupture of membranes (PROM).   Having a placenta that covers the opening of the cervix (placenta previa).   Having a placenta that separates from the uterus (placental abruption).   Having a cervix that is too weak to hold the fetus in the uterus (incompetent cervix).   Having too much fluid in the amniotic sac (polyhydramnios).   Taking  illegal drugs or smoking while pregnant.   Not gaining enough weight while pregnant.   Being younger than 18 and older than 35 years old.   Having a low socioeconomic status.   Being African American. SYMPTOMS Signs and symptoms of preterm labor include:   Menstrual-like cramps, abdominal pain, or back pain.  Uterine contractions that are regular, as frequent as six in an hour, regardless of their intensity (may be mild or painful).  Contractions that start on the top of the uterus and spread down to the lower abdomen and back.   A sense of increased pelvic pressure.   A watery or bloody mucus discharge that comes from the vagina.  TREATMENT Depending on the length of the pregnancy and other circumstances, your health care provider may suggest bed rest. If necessary, there are medicines that can be given to stop contractions and to mature the fetal lungs. If labor happens before 34 weeks of pregnancy, a prolonged hospital stay may be recommended. Treatment depends on the condition of both you and the fetus.  WHAT SHOULD YOU DO IF YOU THINK YOU ARE IN PRETERM LABOR? Call your health care provider right away. You will need to go to the hospital to get checked immediately. HOW CAN YOU PREVENT PRETERM LABOR IN FUTURE PREGNANCIES? You should:   Stop smoking if you smoke.  Maintain healthy weight gain and avoid chemicals and drugs that are not necessary.  Be watchful for   any type of infection.  Inform your health care provider if you have a known history of preterm labor. Document Released: 12/10/2003 Document Revised: 05/22/2013 Document Reviewed: 10/22/2012 Exit[employer] Patient Information 2015 [employer]. This information is not intended to replace advice given to you by your health care provider. Make sure you discuss any questions you have with your health care provider.  

## 2014-08-25 NOTE — Progress Notes (Signed)
Low-risk OB appointment [id number] 6360w1d Estimated Date of Delivery: 10/05/14 BP 112/62 mmHg  Wt 256 lb (116.121 kg)  LMP 12/23/2013  BP, weight, and urine reviewed.  Refer to obstetrical flow sheet for FH & FHR.  Reports good fm.  Denies regular uc's, lof, vb, or uti s/s. Some RLP- discussed prevention measures.  Discussed u/s from 11/10 @ 32.2wks, efw 63% w/ normal afi, dopp, and bpp 8/8, Lt EICF remains.  Reviewed ptl s/s, fkc. Plan:  Continue routine obstetrical care  F/U in 2wks for OB appointment

## 2014-09-09 ENCOUNTER — Ambulatory Visit (INDEPENDENT_AMBULATORY_CARE_PROVIDER_SITE_OTHER): Payer: Medicaid Other | Admitting: Advanced Practice Midwife

## 2014-09-09 VITALS — BP 110/64 | Wt 258.0 lb

## 2014-09-09 DIAGNOSIS — O26843 Uterine size-date discrepancy, third trimester: Secondary | ICD-10-CM

## 2014-09-09 DIAGNOSIS — Z1389 Encounter for screening for other disorder: Secondary | ICD-10-CM

## 2014-09-09 DIAGNOSIS — Z331 Pregnant state, incidental: Secondary | ICD-10-CM

## 2014-09-09 DIAGNOSIS — Z3483 Encounter for supervision of other normal pregnancy, third trimester: Secondary | ICD-10-CM

## 2014-09-09 LAB — POCT URINALYSIS DIPSTICK
Blood, UA: NEGATIVE
GLUCOSE UA: NEGATIVE
KETONES UA: NEGATIVE
Leukocytes, UA: NEGATIVE
Nitrite, UA: NEGATIVE
Protein, UA: NEGATIVE

## 2014-09-09 NOTE — Progress Notes (Signed)
[id number] 6823w2d Estimated Date of Delivery: 10/05/14  Last menstrual period 12/23/2013, not currently breastfeeding.   BP weight and urine results all reviewed and noted.  Please refer to the obstetrical flow sheet for the fundal height and fetal heart rate documentation:  Patient reports good fetal movement, denies any bleeding and no rupture of membranes symptoms or regular contractions. Patient is without complaints. All questions were answered.  Plan:  Continued routine obstetrical care,   Follow up in 1 weeks for OB appointment, GBS, US for EFW d/t ^^ FH

## 2014-09-15 ENCOUNTER — Other Ambulatory Visit: Payer: Self-pay | Admitting: Obstetrics & Gynecology

## 2014-09-15 ENCOUNTER — Encounter: Payer: Self-pay | Admitting: Obstetrics & Gynecology

## 2014-09-15 ENCOUNTER — Ambulatory Visit (INDEPENDENT_AMBULATORY_CARE_PROVIDER_SITE_OTHER): Payer: Medicaid Other | Admitting: Obstetrics & Gynecology

## 2014-09-15 ENCOUNTER — Ambulatory Visit (INDEPENDENT_AMBULATORY_CARE_PROVIDER_SITE_OTHER): Payer: Medicaid Other

## 2014-09-15 VITALS — BP 100/70 | Wt 258.0 lb

## 2014-09-15 DIAGNOSIS — O09523 Supervision of elderly multigravida, third trimester: Secondary | ICD-10-CM

## 2014-09-15 DIAGNOSIS — Z1389 Encounter for screening for other disorder: Secondary | ICD-10-CM

## 2014-09-15 DIAGNOSIS — Z3493 Encounter for supervision of normal pregnancy, unspecified, third trimester: Secondary | ICD-10-CM

## 2014-09-15 DIAGNOSIS — Z331 Pregnant state, incidental: Secondary | ICD-10-CM

## 2014-09-15 DIAGNOSIS — O26843 Uterine size-date discrepancy, third trimester: Secondary | ICD-10-CM

## 2014-09-15 DIAGNOSIS — Z113 Encounter for screening for infections with a predominantly sexual mode of transmission: Secondary | ICD-10-CM

## 2014-09-15 DIAGNOSIS — Z3685 Encounter for antenatal screening for Streptococcus B: Secondary | ICD-10-CM

## 2014-09-15 LAB — POCT URINALYSIS DIPSTICK
Blood, UA: NEGATIVE
GLUCOSE UA: NEGATIVE
KETONES UA: NEGATIVE
Nitrite, UA: NEGATIVE
PROTEIN UA: NEGATIVE

## 2014-09-15 LAB — OB RESULTS CONSOLE GC/CHLAMYDIA
Chlamydia: NEGATIVE
Gonorrhea: NEGATIVE

## 2014-09-15 NOTE — Addendum Note (Signed)
Addended by: Criss AlvinePULLIAM, CHRYSTAL G on: 09/15/2014 03:41 PM   Modules accepted: Orders

## 2014-09-15 NOTE — Progress Notes (Signed)
Sonogram for EFW was normal 52%tile  [id number] 7654w1d Estimated Date of Delivery: 10/05/14  Blood pressure 100/70, weight 258 lb (117.028 kg), last menstrual period 12/23/2013, not currently breastfeeding.   BP weight and urine results all reviewed and noted.  Please refer to the obstetrical flow sheet for the fundal height and fetal heart rate documentation:  Patient reports good fetal movement, denies any bleeding and no rupture of membranes symptoms or regular contractions. Patient is without complaints. All questions were answered.  Plan:  Continued routine obstetrical care,   Follow up in 1 weeks for OB appointment, routine

## 2014-09-15 NOTE — Progress Notes (Signed)
U/S(37+1wks)-vtx fetus, EFw 6 lb 13 oz (52nd%tile), fluid WNL AFI-11.7cm SDP-5.3cm, anterior Gr 2 placenta, FHR-148 bpm, BPP 6/8 unable to document fetal respirations, UA Doppler RI- 0.70 & 0.66, Dr. Despina HiddenEure notified

## 2014-09-16 ENCOUNTER — Telehealth: Payer: Self-pay | Admitting: *Deleted

## 2014-09-16 LAB — GC/CHLAMYDIA PROBE AMP
CT PROBE, AMP APTIMA: NEGATIVE
GC PROBE AMP APTIMA: NEGATIVE

## 2014-09-16 NOTE — Telephone Encounter (Signed)
Pt [facility]2014 is not eligible for disability thru her employment. Faxed forms this am. Informed pt will fill out the forms and call her when finished. Pt verbalized understanding.

## 2014-09-17 LAB — US OB FOLLOW UP

## 2014-09-17 LAB — STREP B DNA PROBE: GBSP: NOT DETECTED

## 2014-09-19 DIAGNOSIS — Z029 Encounter for administrative examinations, unspecified: Secondary | ICD-10-CM

## 2014-09-22 ENCOUNTER — Encounter (HOSPITAL_COMMUNITY): Payer: Self-pay | Admitting: *Deleted

## 2014-09-22 ENCOUNTER — Encounter: Payer: Self-pay | Admitting: Women's Health

## 2014-09-22 ENCOUNTER — Encounter: Payer: Medicaid Other | Admitting: Obstetrics & Gynecology

## 2014-09-22 ENCOUNTER — Inpatient Hospital Stay (HOSPITAL_COMMUNITY)
Admission: AD | Admit: 2014-09-22 | Discharge: 2014-09-24 | DRG: 775 | Disposition: A | Discharge status: Home / Self Care | Payer: Medicaid Other | Source: Ambulatory Visit | Attending: Obstetrics & Gynecology | Admitting: Obstetrics & Gynecology

## 2014-09-22 ENCOUNTER — Ambulatory Visit (INDEPENDENT_AMBULATORY_CARE_PROVIDER_SITE_OTHER): Payer: Medicaid Other | Admitting: Women's Health

## 2014-09-22 VITALS — BP 116/74 | Wt 258.0 lb

## 2014-09-22 DIAGNOSIS — Z806 Family history of leukemia: Secondary | ICD-10-CM

## 2014-09-22 DIAGNOSIS — IMO0001 Reserved for inherently not codable concepts without codable children: Secondary | ICD-10-CM

## 2014-09-22 DIAGNOSIS — O26843 Uterine size-date discrepancy, third trimester: Principal | ICD-10-CM | POA: Diagnosis present

## 2014-09-22 DIAGNOSIS — Z331 Pregnant state, incidental: Secondary | ICD-10-CM

## 2014-09-22 DIAGNOSIS — Z833 Family history of diabetes mellitus: Secondary | ICD-10-CM | POA: Diagnosis not present

## 2014-09-22 DIAGNOSIS — O09523 Supervision of elderly multigravida, third trimester: Secondary | ICD-10-CM

## 2014-09-22 DIAGNOSIS — Z3483 Encounter for supervision of other normal pregnancy, third trimester: Secondary | ICD-10-CM

## 2014-09-22 DIAGNOSIS — Z8249 Family history of ischemic heart disease and other diseases of the circulatory system: Secondary | ICD-10-CM

## 2014-09-22 DIAGNOSIS — Z3A38 38 weeks gestation of pregnancy: Secondary | ICD-10-CM | POA: Diagnosis present

## 2014-09-22 DIAGNOSIS — Z1389 Encounter for screening for other disorder: Secondary | ICD-10-CM

## 2014-09-22 LAB — POCT URINALYSIS DIPSTICK
GLUCOSE UA: NEGATIVE
KETONES UA: NEGATIVE
Leukocytes, UA: NEGATIVE
NITRITE UA: NEGATIVE
Protein, UA: NEGATIVE
RBC UA: NEGATIVE

## 2014-09-22 MED ORDER — LIDOCAINE HCL (PF) 1 % IJ SOLN
30.0000 mL | INTRAMUSCULAR | Status: DC | PRN
Start: 2014-09-22 — End: 2014-09-23
  Filled 2014-09-22: qty 30

## 2014-09-22 MED ORDER — DIPHENHYDRAMINE HCL 50 MG/ML IJ SOLN: 12.5000 mg | INTRAMUSCULAR | Status: DC | PRN

## 2014-09-22 MED ORDER — LACTATED RINGERS IV SOLN: 500.0000 mL | INTRAVENOUS | Status: DC | PRN

## 2014-09-22 MED ORDER — EPHEDRINE 5 MG/ML INJ
10.0000 mg | INTRAVENOUS | Status: DC | PRN
  Filled 2014-09-22: qty 2

## 2014-09-22 MED ORDER — LACTATED RINGERS IV SOLN
INTRAVENOUS | Status: DC
  Administered 2014-09-22: via INTRAVENOUS

## 2014-09-22 MED ORDER — OXYTOCIN BOLUS FROM INFUSION
500.0000 mL | INTRAVENOUS | Status: DC
  Administered 2014-09-23: 500 mL via INTRAVENOUS

## 2014-09-22 MED ORDER — PHENYLEPHRINE 40 MCG/ML (10ML) SYRINGE FOR IV PUSH (FOR BLOOD PRESSURE SUPPORT)
80.0000 ug | PREFILLED_SYRINGE | INTRAVENOUS | Status: DC | PRN
  Filled 2014-09-22: qty 2

## 2014-09-22 MED ORDER — OXYCODONE-ACETAMINOPHEN 5-325 MG PO TABS
2.0000 | ORAL_TABLET | ORAL | Status: DC | PRN
  Administered 2014-09-23: 2 via ORAL
  Filled 2014-09-22: qty 2

## 2014-09-22 MED ORDER — CITRIC ACID-SODIUM CITRATE 334-500 MG/5ML PO SOLN: 30.0000 mL | ORAL | Status: DC | PRN

## 2014-09-22 MED ORDER — LACTATED RINGERS IV SOLN: 500.0000 mL | Freq: Once | INTRAVENOUS | Status: DC

## 2014-09-22 MED ORDER — PHENYLEPHRINE 40 MCG/ML (10ML) SYRINGE FOR IV PUSH (FOR BLOOD PRESSURE SUPPORT)
80.0000 ug | PREFILLED_SYRINGE | INTRAVENOUS | Status: DC | PRN
  Filled 2014-09-22: qty 10
  Filled 2014-09-22: qty 2

## 2014-09-22 MED ORDER — ACETAMINOPHEN 325 MG PO TABS: 650.0000 mg | ORAL_TABLET | ORAL | Status: DC | PRN

## 2014-09-22 MED ORDER — OXYCODONE-ACETAMINOPHEN 5-325 MG PO TABS: 1.0000 | ORAL_TABLET | ORAL | Status: DC | PRN

## 2014-09-22 MED ORDER — OXYTOCIN 40 UNITS IN LACTATED RINGERS INFUSION - SIMPLE MED
62.5000 mL/h | INTRAVENOUS | Status: DC
  Filled 2014-09-22: qty 1000

## 2014-09-22 MED ORDER — FENTANYL 2.5 MCG/ML BUPIVACAINE 1/10 % EPIDURAL INFUSION (WH - ANES)
14.0000 mL/h | INTRAMUSCULAR | Status: DC | PRN
  Filled 2014-09-22: qty 125

## 2014-09-22 MED ORDER — ONDANSETRON HCL 4 MG/2ML IJ SOLN: 4.0000 mg | Freq: Four times a day (QID) | INTRAMUSCULAR | Status: DC | PRN

## 2014-09-22 NOTE — Patient Instructions (Signed)
Call the office ([phone]) or go to [facility] if:  You begin to have strong, frequent contractions  Your water breaks.  Sometimes it is a big gush of fluid, sometimes it is just a trickle that keeps getting your panties wet or running down your legs  You have vaginal bleeding.  It is normal to have a small amount of spotting if your cervix was checked.   You don't feel your baby moving like normal.  If you don't, get you something to eat and drink and lay down and focus on feeling your baby move.  You should feel at least 10 movements in 2 hours.  If you don't, you should call the office or go to [facility].    Braxton Hicks Contractions Contractions of the uterus can occur throughout pregnancy. Contractions are not always a sign that you are in labor.  WHAT ARE BRAXTON HICKS CONTRACTIONS?  Contractions that occur before labor are called Braxton Hicks contractions, or false labor. Toward the end of pregnancy (32-34 weeks), these contractions can develop more often and may become more forceful. This is not true labor because these contractions do not result in opening (dilatation) and thinning of the cervix. They are sometimes difficult to tell apart from true labor because these contractions can be forceful and people have different pain tolerances. You should not feel embarrassed if you go to the hospital with false labor. Sometimes, the only way to tell if you are in true labor is for your health care provider to look for changes in the cervix. If there are no prenatal problems or other health problems associated with the pregnancy, it is completely safe to be sent home with false labor and await the onset of true labor. HOW CAN YOU TELL THE DIFFERENCE BETWEEN TRUE AND FALSE LABOR? False Labor  The contractions of false labor are usually shorter and not as hard as those of true labor.   The contractions are usually irregular.   The contractions are often felt in the front of  the lower abdomen and in the groin.   The contractions may go away when you walk around or change positions while lying down.   The contractions get weaker and are shorter lasting as time goes on.   The contractions do not usually become progressively stronger, regular, and closer together as with true labor.  True Labor  Contractions in true labor last 30-70 seconds, become very regular, usually become more intense, and increase in frequency.   The contractions do not go away with walking.   The discomfort is usually felt in the top of the uterus and spreads to the lower abdomen and low back.   True labor can be determined by your health care provider with an exam. This will show that the cervix is dilating and getting thinner.  WHAT TO REMEMBER  Keep up with your usual exercises and follow other instructions given by your health care provider.   Take medicines as directed by your health care provider.   Keep your regular prenatal appointments.   Eat and drink lightly if you think you are going into labor.   If Braxton Hicks contractions are making you uncomfortable:   Change your position from lying down or resting to walking, or from walking to resting.   Sit and rest in a tub of warm water.   Drink 2-3 glasses of water. Dehydration may cause these contractions.   Do slow and deep breathing several times an hour.    WHEN SHOULD I SEEK IMMEDIATE MEDICAL CARE? Seek immediate medical care if:  Your contractions become stronger, more regular, and closer together.   You have fluid leaking or gushing from your vagina.   You have a fever.   You pass blood-tinged mucus.   You have vaginal bleeding.   You have continuous abdominal pain.   You have low back pain that you never had before.   You feel your baby's head pushing down and causing pelvic pressure.   Your baby is not moving as much as it used to.  Document Released: 09/19/2005 Document  Revised: 09/24/2013 Document Reviewed: 07/01/2013 [employer] Patient Information 2015 [employer]. This information is not intended to replace advice given to you by your health care provider. Make sure you discuss any questions you have with your health care provider.  

## 2014-09-22 NOTE — Plan of Care (Signed)
Problem: Consults Goal: Birthing Suites Patient Information Press F2 to bring up selections list Outcome: Completed/Met Date Met:  09/22/14  Pt 37-[redacted] weeks EGA

## 2014-09-22 NOTE — Progress Notes (Signed)
Low-risk OB appointment G3P2002 6993w1d Estimated Date of Delivery: 10/05/14 BP 116/74 mmHg  Wt 258 lb (117.028 kg)  LMP 12/23/2013  BP, weight, and urine reviewed.  Refer to obstetrical flow sheet for FH & FHR.  Reports good fm.  Denies regular uc's, lof, vb, or uti s/s. Thinks she lost mucous plug this am. Reviewed gbs-, labor s/s, fkc. Plan:  Continue routine obstetrical care  F/U in 1wk for OB appointment

## 2014-09-22 NOTE — H&P (Signed)
LABOR ADMISSION HISTORY AND PHYSICAL  Kristin [name] is a 33 y.o. female G3P2002 with IUP at 2058w1d by 9 week ultrasound consistent with LMP presenting for contractions starting earlier today. States she was seen in clinic and was 4cm dilated at that visit. Has continued to contract at home today. Now stronger and more regular so came to MAU for further evaluation. While in the bathroom, noticed a trickle of fluid from her vagina. May be urine, but wants to make sure she hasn't broken her water.   + Fetal movement. Denies vaginal bleeding.   She desires an epidural for labor pain control. She plans on breast and bottle feeding. She is considering BTL for birth control, although states she is not sure. Consent is signed, but she is also considering Mirena IUD.   Dating: By 9 week ultrasound consistent with LMP --->  Estimated Date of Delivery: 10/05/14  Prenatal History/Complications: - AMA - Short interval between pregnancies  - Migraines  Past Medical History: Past Medical History  Diagnosis Date  . Migraines   . Vaginal Pap smear, abnormal   . Contraceptive management 11/05/2013    Past Surgical History: Past Surgical History  Procedure Laterality Date  . No past surgeries      Obstetrical History: OB History    Gravida Para Term Preterm AB TAB SAB Ectopic Multiple Living   3 2 2       2       Gynecological History: History of boils. Negative pap smear.   Social History: History   Social History  . Marital Status: Single    Spouse Name: N/A    Number of Children: N/A  . Years of Education: N/A   Social History Main Topics  . Smoking status: Never Smoker   . Smokeless tobacco: Never Used  . Alcohol Use: No     Comment: occ; not now  . Drug Use: No  . Sexual Activity: Not Currently    Birth Control/ Protection: None   Other Topics Concern  . None   Social History Narrative    Family History: Family History  Problem Relation Age of Onset  . Other Mother      enlarged heart  . Diabetes Mother   . Hypertension Mother   . Hyperlipidemia Mother   . Heart disease Mother     heart murmer  . Hypertension Father   . Hyperlipidemia Father   . Other Brother     enlarged heart; colloid cyst of the third ventricle( of the brain)  . Cancer Paternal Grandmother     leukemia  . Cancer Paternal Grandfather     lung  . Hypertension Sister     Allergies: No Known Allergies  Prescriptions prior to admission  Medication Sig Dispense Refill Last Dose  . acetaminophen (TYLENOL) 500 MG tablet Take 1,000 mg by mouth every 6 (six) hours as needed.   09/22/2014 at 0900  . Prenatal Vit-Fe Fumarate-FA (PREPLUS) 27-1 MG TABS TAKE (1) TABLET BY MOUTH DAILY AT 12:00 NOON. 30 tablet 11 09/21/2014 at 2200  . butalbital-acetaminophen-caffeine (FIORICET, ESGIC) 50-325-40 MG per tablet TAKE  (1)  TABLET  EVERY FOUR HOURS AS NEEDED FOR HEADACHE.    - MAY MAKE DROWSY - (Patient not taking: Reported on 09/15/2014) 20 tablet 0 Not Taking at Unknown time  . sulfamethoxazole-trimethoprim (BACTRIM DS) 800-160 MG per tablet Take 1 tablet by mouth 2 (two) times daily. (Patient not taking: Reported on 08/25/2014) 20 tablet 0 Not Taking at Unknown time  Review of Systems   All systems reviewed and negative except as stated in HPI  Blood pressure 110/66, pulse 77, temperature 98.5 F (36.9 C), temperature source Oral, resp. rate 18, height 5\' 4"  (1.626 m), weight 258 lb (117.028 kg), last menstrual period 12/23/2013, not currently breastfeeding. General appearance: alert, cooperative and no distress Lungs: normal effort Heart: normal rate Abdomen: soft, non-tender; bowel sounds normal SVE 4-5/70/-2 --> 5-6/70/-2 Extremities: Homans sign is negative, no sign of DVT Presentation: cephalic Fetal monitoringBaseline: 150 bpm, Variability: Good {> 6 bpm), Accelerations: Reactive and Decelerations: Absent Uterine activityRegular every 3-4 minutes, although difficult to trace   Dilation: 5.5 Effacement (%): 70 Station: -2 Exam by:: Dr. Ane PaymentMcEachern    Prenatal labs: ABO, Rh:   Antibody: NEG (10/08 0943) Rubella:   RPR: NON REAC (10/08 0943)  HBsAg: NEGATIVE (06/04 1510)  HIV: NONREACTIVE (10/08 0943)  GBS: NOT DETECTED (12/14 1622)  2 hr Glucola 80/101/94 Genetic screening  Negative Anatomy US Normal except for left EICF   Prenatal Transfer Tool  Maternal Diabetes: No Genetic Screening: Normal Maternal Ultrasounds/Referrals: Abnormal:  Findings:   Isolated EIF (echogenic intracardiac focus) Fetal Ultrasounds or other Referrals:  None Maternal Substance Abuse:  No Significant Maternal Medications:  None Significant Maternal Lab Results: Lab values include: Group B Strep negative     Results for orders placed or performed in visit on 09/22/14 (from the past 24 hour(s))  POCT Urinalysis Dipstick   Collection Time: 09/22/14 10:20 AM  Result Value Ref Range   Color, UA     Clarity, UA     Glucose, UA neg    Bilirubin, UA     Ketones, UA neg    Spec Grav, UA     Blood, UA neg    pH, UA     Protein, UA neg    Urobilinogen, UA     Nitrite, UA neg    Leukocytes, UA Negative     Patient Active Problem List   Diagnosis Date Noted  . Uterine size date discrepancy, antepartum 09/22/2014  . Boil, vulva 03/25/2014  . Supervision of other normal pregnancy 03/06/2014  . AMA (advanced maternal age) multigravida 35+ 03/06/2014  . Short interval between pregnancies complicating pregnancy in first trimester, antepartum 03/06/2014  . Migraines 03/07/2013    Assessment: Kristin IvanMiranda [name] is a 33 y.o. G3P2002 at 7313w1d here for active labor at term.   #Labor:Expectant management.  #Pain: Epidural upon request.  #FWB: Category I #ID:  GBS negative #MOF: Breast/Bottle #MOC:BTL vs Mirena, consent for BTL signed #Circ:  N/a, female  William DaltonMcEachern, Noreta Kue 09/22/2014, 11:07 PM

## 2014-09-22 NOTE — MAU Note (Signed)
Contractions tonight; every 10 minutes. Seen in office today, dilated 4 cm. States had gush of watery fluid in toilet in MAU. Denies vaginal bleeding. Positive fetal movement.

## 2014-09-23 ENCOUNTER — Encounter (HOSPITAL_COMMUNITY): Payer: Self-pay | Admitting: Anesthesiology

## 2014-09-23 DIAGNOSIS — Z3A38 38 weeks gestation of pregnancy: Secondary | ICD-10-CM

## 2014-09-23 DIAGNOSIS — O09523 Supervision of elderly multigravida, third trimester: Secondary | ICD-10-CM

## 2014-09-23 LAB — CBC
HEMATOCRIT: 33.5 % — AB (ref 36.0–46.0)
Hemoglobin: 11.2 g/dL — ABNORMAL LOW (ref 12.0–15.0)
MCH: 27.7 pg (ref 26.0–34.0)
MCHC: 33.4 g/dL (ref 30.0–36.0)
MCV: 82.7 fL (ref 78.0–100.0)
Platelets: 223 10*3/uL (ref 150–400)
RBC: 4.05 MIL/uL (ref 3.87–5.11)
RDW: 13.8 % (ref 11.5–15.5)
WBC: 8.3 10*3/uL (ref 4.0–10.5)

## 2014-09-23 LAB — RPR

## 2014-09-23 LAB — HIV ANTIBODY (ROUTINE TESTING W REFLEX): HIV 1&2 Ab, 4th Generation: NONREACTIVE

## 2014-09-23 MED ORDER — DIPHENHYDRAMINE HCL 25 MG PO CAPS: 25.0000 mg | ORAL_CAPSULE | Freq: Four times a day (QID) | ORAL | Status: DC | PRN

## 2014-09-23 MED ORDER — PRENATAL MULTIVITAMIN CH
1.0000 | ORAL_TABLET | Freq: Every day | ORAL | Status: DC
  Filled 2014-09-23: qty 14
  Filled 2014-09-23: qty 1

## 2014-09-23 MED ORDER — OXYCODONE-ACETAMINOPHEN 5-325 MG PO TABS: 1.0000 | ORAL_TABLET | ORAL | Status: DC | PRN

## 2014-09-23 MED ORDER — BENZOCAINE-MENTHOL 20-0.5 % EX AERO: 1.0000 "application " | INHALATION_SPRAY | CUTANEOUS | Status: DC | PRN

## 2014-09-23 MED ORDER — LANOLIN HYDROUS EX OINT: TOPICAL_OINTMENT | CUTANEOUS | Status: DC | PRN

## 2014-09-23 MED ORDER — WITCH HAZEL-GLYCERIN EX PADS: 1.0000 "application " | MEDICATED_PAD | CUTANEOUS | Status: DC | PRN

## 2014-09-23 MED ORDER — TETANUS-DIPHTH-ACELL PERTUSSIS 5-2.5-18.5 LF-MCG/0.5 IM SUSP
0.5000 mL | Freq: Once | INTRAMUSCULAR | Status: AC
  Administered 2014-09-24: 0.5 mL via INTRAMUSCULAR
  Filled 2014-09-23: qty 0.5

## 2014-09-23 MED ORDER — SENNOSIDES-DOCUSATE SODIUM 8.6-50 MG PO TABS
2.0000 | ORAL_TABLET | ORAL | Status: DC
  Administered 2014-09-23: 2 via ORAL
  Filled 2014-09-23: qty 2

## 2014-09-23 MED ORDER — ZOLPIDEM TARTRATE 5 MG PO TABS: 5.0000 mg | ORAL_TABLET | Freq: Every evening | ORAL | Status: DC | PRN

## 2014-09-23 MED ORDER — OXYCODONE-ACETAMINOPHEN 5-325 MG PO TABS
2.0000 | ORAL_TABLET | ORAL | Status: DC | PRN
  Administered 2014-09-23 (×2): 2 via ORAL
  Filled 2014-09-23 (×2): qty 2

## 2014-09-23 MED ORDER — IBUPROFEN 600 MG PO TABS
600.0000 mg | ORAL_TABLET | Freq: Four times a day (QID) | ORAL | Status: DC
  Administered 2014-09-23 – 2014-09-24 (×6): 600 mg via ORAL
  Filled 2014-09-23 (×6): qty 1

## 2014-09-23 MED ORDER — ONDANSETRON HCL 4 MG/2ML IJ SOLN: 4.0000 mg | INTRAMUSCULAR | Status: DC | PRN

## 2014-09-23 MED ORDER — DIBUCAINE 1 % RE OINT: 1.0000 "application " | TOPICAL_OINTMENT | RECTAL | Status: DC | PRN

## 2014-09-23 MED ORDER — SIMETHICONE 80 MG PO CHEW
80.0000 mg | CHEWABLE_TABLET | ORAL | Status: DC | PRN
Start: 2014-09-23 — End: 2014-09-24
  Administered 2014-09-23: 80 mg via ORAL
  Filled 2014-09-23: qty 1

## 2014-09-23 MED ORDER — ONDANSETRON HCL 4 MG PO TABS: 4.0000 mg | ORAL_TABLET | ORAL | Status: DC | PRN

## 2014-09-23 NOTE — Lactation Note (Signed)
This note was copied from the chart of [name]. Lactation Consultation Note  Mom would like to give baby formula because she has been on the breast for about 50 minutes.  Prior to this she was BF well.  A pacifier was introduced earlier and I suspect that this affected Jerrel IvoryGabrielle because she is now tongue thrusting and clamping down.  Tongue exercises were done and I was able to get her to latch deeper.  I encouraged mom to continue BF for now to see if her suck improved.  I explained to her that we would respect her choice to use formula if she decided to.  I encouraged her to spoon feed for now because there is a size difference between mom's nipples and the artificial nipple.  Mom requested a larger size flange for her hand pump.  A number 30 was given to her.  Patient Name: Girl Kristin IvanMiranda Sia Today's Date: 09/23/2014     Maternal Data    Feeding Feeding Type: Breast Fed Length of feed: 20 min  LATCH Score/Interventions Latch: Grasps breast easily, tongue down, lips flanged, rhythmical sucking. Intervention(s): Assist with latch  Audible Swallowing: None Intervention(s): Skin to skin;Hand expression Intervention(s): Skin to skin;Hand expression  Type of Nipple: Everted at rest and after stimulation  Comfort (Breast/Nipple): Soft / non-tender     Hold (Positioning): No assistance needed to correctly position infant at breast. Intervention(s): Breastfeeding basics reviewed;Support Pillows;Position options;Skin to skin  LATCH Score: 8  Lactation Tools Discussed/Used     Consult Status      Soyla DryerJoseph, Allysia Ingles 09/23/2014, 7:16 PM

## 2014-09-23 NOTE — Lactation Note (Signed)
This note was copied from the chart of Kristin [name]. Lactation Consultation Note  Mother and baby resting.  Spoke w/ mother briefly.  P3.  She breastfed second child for 5 months and supplemented with formula. Discussed how to hand express. Mom encouraged to feed baby 8-12 times/24 hours and with feeding cues.  Mom made aware of O/P services, breastfeeding support groups, community resources, and our phone # for post-discharge questions.  Encouraged her to call if she needs assistance w/ latching.   Patient Name: Kristin Jeanne IvanMiranda Newborn ZOXWR'UToday's Date: 09/23/2014 Reason for consult: Initial assessment   Maternal Data Has patient been taught Hand Expression?: Yes Does the patient have breastfeeding experience prior to this delivery?: Yes  Feeding    LATCH Score/Interventions                      Lactation Tools Discussed/Used     Consult Status Consult Status: Follow-up Date: 09/24/14 Follow-up type: In-patient    Dahlia ByesBerkelhammer, Markhi Kleckner [facility] 09/23/2014, 8:11 AM

## 2014-09-23 NOTE — Plan of Care (Signed)
Problem: Discharge Progression Outcomes Goal: Barriers To Progression Addressed/Resolved Outcome: Progressing Mother told MD that she had decided not to get BTL

## 2014-09-23 NOTE — Anesthesia Preprocedure Evaluation (Deleted)
Anesthesia Evaluation  Patient identified by MRN, date of birth, ID band Patient awake    Reviewed: Allergy & Precautions, H&P , NPO status , Patient's Chart, lab work & pertinent test results  Airway Mallampati: III  TM Distance: >3 FB Neck ROM: Full    Dental no notable dental hx. (+) Teeth Intact   Pulmonary neg pulmonary ROS,  breath sounds clear to auscultation  Pulmonary exam normal       Cardiovascular negative cardio ROS  Rhythm:Regular Rate:Normal     Neuro/Psych  Headaches, negative psych ROS   GI/Hepatic negative GI ROS, Neg liver ROS,   Endo/Other  Morbid obesity  Renal/GU negative Renal ROS  negative genitourinary   Musculoskeletal negative musculoskeletal ROS (+)   Abdominal (+) + obese,   Peds  Hematology   Anesthesia Other Findings   Reproductive/Obstetrics (+) Pregnancy                             Anesthesia Physical Anesthesia Plan  ASA: III  Anesthesia Plan: Epidural   Post-op Pain Management:    Induction:   Airway Management Planned: Natural Airway  Additional Equipment:   Intra-op Plan:   Post-operative Plan:   Informed Consent: I have reviewed the patients History and Physical, chart, labs and discussed the procedure including the risks, benefits and alternatives for the proposed anesthesia with the patient or authorized representative who has indicated his/her understanding and acceptance.     Plan Discussed with: Anesthesiologist  Anesthesia Plan Comments: (Patient felt urge to push prior to epidural placement. Checked by MD and she is fully dilated. Epidural not performed.)       Anesthesia Quick Evaluation

## 2014-09-24 MED ORDER — IBUPROFEN 600 MG PO TABS: 600.0000 mg | ORAL_TABLET | Freq: Four times a day (QID) | ORAL | Status: DC

## 2014-09-24 NOTE — Discharge Instructions (Signed)

## 2014-09-24 NOTE — Discharge Summary (Signed)
Obstetric Discharge Summary Reason for Admission: onset of labor Prenatal Procedures: none Intrapartum Procedures: spontaneous vaginal delivery Postpartum Procedures: none Complications-Operative and Postpartum: none HEMOGLOBIN  Date Value Ref Range Status  09/22/2014 11.2* 12.0 - 15.0 g/dL Final   HCT  Date Value Ref Range Status  09/22/2014 33.5* 36.0 - 46.0 % Final    Physical Exam:  General: alert, cooperative, appears stated age and no distress Lochia: appropriate Uterine Fundus: firm Incision: N/A DVT Evaluation: No evidence of DVT seen on physical exam. Negative Homan's sign.  Discharge Diagnoses: Term Pregnancy-delivered  Discharge Information: Date: 09/24/2014 Activity: pelvic rest Diet: routine Medications: Ibuprofen Condition: stable Instructions: refer to practice specific booklet Discharge to: home   Newborn Data: Live born female  Birth Weight: 7 lb 10.8 oz (3481 g) APGAR: 9, 9  Home with mother.  Benjamin Stainhompson, McKenzie L, MD 09/24/2014, 7:58 AM   I have seen and examined this patient and I agree with the above. Cam HaiSHAW, Quinlynn Cuthbert CNM 9:43 AM 10/09/2014

## 2014-09-24 NOTE — Lactation Note (Signed)
This note was copied from the chart of Girl Jeanne IvanMiranda Lamarche. Lactation Consultation Note  Patient Name: Girl Jeanne IvanMiranda Nilsen ZOXWR'UToday's Date: 09/24/2014 Reason for consult: Follow-up assessment Baby 34 hours of life. Mom reports baby nursing well, latches deeply. Mom is offering breast and bottle, stating that this is how she fed her second child. Discussed supply and demand. Mom states milk seems to be coming in later than before. Enc mom to nurse often, and to use hand pump for additional stimulation. Referred mom to Baby and Me booklet, and mom aware of OP/BFSG, and LC phone line assistance after D/C.  Maternal Data    Feeding Feeding Type: Bottle Fed - Formula Nipple Type: Slow - flow  LATCH Score/Interventions                      Lactation Tools Discussed/Used     Consult Status Consult Status: Complete    Rilyn Scroggs 09/24/2014, 11:00 AM

## 2014-09-30 ENCOUNTER — Encounter: Payer: Medicaid Other | Admitting: Women's Health

## 2014-11-06 ENCOUNTER — Telehealth: Payer: Self-pay | Admitting: Advanced Practice Midwife

## 2014-11-06 NOTE — Telephone Encounter (Signed)
Pt states that she had some about the depo and the nexplanon. Pt wanted to know if she had to be on her period to get either one. I advised her that she would not have to be on her period for either one. Pt has a visit scheduled on the 9th of this month.

## 2014-11-11 ENCOUNTER — Encounter: Payer: Self-pay | Admitting: Advanced Practice Midwife

## 2014-11-11 ENCOUNTER — Ambulatory Visit (INDEPENDENT_AMBULATORY_CARE_PROVIDER_SITE_OTHER): Payer: Medicaid Other | Admitting: Advanced Practice Midwife

## 2014-11-11 MED ORDER — MEDROXYPROGESTERONE ACETATE 150 MG/ML IM SUSP: 150.0000 mg | INTRAMUSCULAR | Status: DC

## 2014-11-11 NOTE — Progress Notes (Signed)
  Kristin [name] is a 34 y.o. who presents for a postpartum visit. She is 6 weeks postpartum following a spontaneous vaginal delivery. I have fully reviewed the prenatal and intrapartum course. The delivery was at 38.2 gestational weeks.  Anesthesia: none. Postpartum course has been uneventful. Baby's course has been uneventful. Baby is feeding by bottle. Bleeding: no bleeding Had a period that ended yesterday. Bowel function is normal. Bladder function is normal. Patient is sexually active. Contraception method is none. No sex since period. Postpartum depression screening: negative.    Current outpatient prescriptions:  .  Prenatal Vit-Fe Fumarate-FA (PREPLUS) 27-1 MG TABS, TAKE (1) TABLET BY MOUTH DAILY AT 12:00 NOON., Disp: 30 tablet, Rfl: 11  Review of Systems   Constitutional: Negative for fever and chills Eyes: Negative for visual disturbances Respiratory: Negative for shortness of breath, dyspnea Cardiovascular: Negative for chest pain or palpitations  Gastrointestinal: Negative for vomiting, diarrhea and constipation Genitourinary: Negative for dysuria and urgency Musculoskeletal: Negative for back pain, joint pain, myalgias  Neurological: Negative for dizziness and headaches   Objective:     Filed Vitals:   11/11/14 1606  BP: 120/80   General:  alert, cooperative and no distress   Breasts:  negative  Lungs: clear to auscultation bilaterally  Heart:  regular rate and rhythm  Abdomen: Soft, nontender   Vulva:  normal  Vagina: normal vagina  Cervix:  closed  Corpus: Well involuted     Rectal Exam: no hemorrhoids        Assessment:    normal postpartum exam.  Plan:    1. Contraception: Depo-Provera injections 2. Follow up in:  asap for depo (sent to pharmacy) or as needed.  3.  No sex until birth control

## 2014-11-12 ENCOUNTER — Encounter: Payer: Self-pay | Admitting: *Deleted

## 2014-11-12 ENCOUNTER — Ambulatory Visit (INDEPENDENT_AMBULATORY_CARE_PROVIDER_SITE_OTHER): Payer: Medicaid Other | Admitting: *Deleted

## 2014-11-12 DIAGNOSIS — Z3202 Encounter for pregnancy test, result negative: Secondary | ICD-10-CM

## 2014-11-12 DIAGNOSIS — Z3042 Encounter for surveillance of injectable contraceptive: Secondary | ICD-10-CM

## 2014-11-12 LAB — POCT URINE PREGNANCY: PREG TEST UR: NEGATIVE

## 2014-11-12 MED ORDER — MEDROXYPROGESTERONE ACETATE 150 MG/ML IM SUSP
150.0000 mg | Freq: Once | INTRAMUSCULAR | Status: AC
  Administered 2014-11-12: 150 mg via INTRAMUSCULAR

## 2014-11-12 NOTE — Progress Notes (Signed)
Pt here for Depo shot. Reports no problems at this time. Return in 12 weeks for next shot. JSY 

## 2014-11-25 ENCOUNTER — Telehealth: Payer: Self-pay | Admitting: Obstetrics & Gynecology

## 2014-11-25 NOTE — Telephone Encounter (Signed)
Pt states that she is having terrible pain near her pelvic bone. Pt states that she has been having the pain since delivery and has just gotten worse. Pt states that she has had a period since she delivered and it was really bad. Pt was advised that I would send this message to a provider and see what they want her to, and that it will probably be tomorrow before she hears back from us. Pt verbalized understanding.

## 2014-11-26 NOTE — Telephone Encounter (Signed)
Pt states still having to pain and it is worse at night.  Informed pt of need to come into office to be evaluated and call transferred to front staff for an appointment to be scheduled.

## 2014-12-02 ENCOUNTER — Ambulatory Visit: Payer: Medicaid Other | Admitting: Advanced Practice Midwife

## 2014-12-11 ENCOUNTER — Encounter: Payer: Self-pay | Admitting: Advanced Practice Midwife

## 2014-12-11 ENCOUNTER — Ambulatory Visit (INDEPENDENT_AMBULATORY_CARE_PROVIDER_SITE_OTHER): Payer: Medicaid Other | Admitting: Advanced Practice Midwife

## 2014-12-11 NOTE — Progress Notes (Signed)
[facility] Visit  Patient name: Kristin IvanMiranda [name] MRN [id number]  Date of birth: 06/14/1981  CC & HPI:  Kristin [name] is a 34 y.o.  female presenting today for C/O persistant pain at symphysis pubis since SVD 10 weeks ago.  No better. A little worse.  No trauma at birth.  Requests PT referral  Pertinent History Reviewed:  Medical & Surgical Hx:   Past Medical History  Diagnosis Date  . Migraines   . Vaginal Pap smear, abnormal   . Contraceptive management 11/05/2013   Past Surgical History  Procedure Laterality Date  . No past surgeries     Family History  Problem Relation Age of Onset  . Other Mother     enlarged heart  . Diabetes Mother   . Hypertension Mother   . Hyperlipidemia Mother   . Heart disease Mother     heart murmer  . Hypertension Father   . Hyperlipidemia Father   . Other Brother     enlarged heart; colloid cyst of the third ventricle( of the brain)  . Cancer Paternal Grandmother     leukemia  . Cancer Paternal Grandfather     lung  . Hypertension Sister     Current outpatient prescriptions:  .  medroxyPROGESTERone (DEPO-PROVERA) 150 MG/ML injection, Inject 1 mL (150 mg total) into the muscle every 3 (three) months., Disp: 1 mL, Rfl: 3 .  Prenatal Vit-Fe Fumarate-FA (PREPLUS) 27-1 MG TABS, TAKE (1) TABLET BY MOUTH DAILY AT 12:00 NOON., Disp: 30 tablet, Rfl: 11 Social History: Reviewed -  reports that she has never smoked. She has never used smokeless tobacco.  Review of Systems:   Constitutional: Negative for fever and chills Gastrointestinal: Negative for abdominal pain Genitourinary: Negative for dysuria and urgency, vaginal irritation or itching   Objective Findings:  Vitals: BP 120/72 mmHg  Pulse 64  Ht 5\' 4"  (1.626 m)  Wt 247 lb 8 oz (112.265 kg)  BMI 42.46 kg/m2  LMP 12/03/2014  Physical Examination: General appearance - alert, well appearing, and in no distress Mental status - alert, oriented to person, place, and  time Pelvic - Very tender at symphysis pubis Normal Gait, range of motion  Tender with sitting up, some walking  No results found for this or any previous visit (from the past 24 hour(s)).       Assessment & Plan:  A:   Diastasis of symphysis pubis P:  PT referral made   CRESENZO-DISHMAN,Vinette Crites CNM 12/11/2014 4:33 PM

## 2015-01-13 ENCOUNTER — Ambulatory Visit (HOSPITAL_COMMUNITY): Payer: Medicaid Other | Admitting: Physical Therapy

## 2015-01-22 ENCOUNTER — Ambulatory Visit (HOSPITAL_COMMUNITY): Payer: Medicaid Other | Admitting: Physical Therapy

## 2015-02-03 ENCOUNTER — Ambulatory Visit: Payer: Medicaid Other

## 2015-02-10 ENCOUNTER — Ambulatory Visit (INDEPENDENT_AMBULATORY_CARE_PROVIDER_SITE_OTHER): Payer: Medicaid Other | Admitting: *Deleted

## 2015-02-10 ENCOUNTER — Encounter: Payer: Self-pay | Admitting: *Deleted

## 2015-02-10 DIAGNOSIS — Z3202 Encounter for pregnancy test, result negative: Secondary | ICD-10-CM

## 2015-02-10 DIAGNOSIS — Z3042 Encounter for surveillance of injectable contraceptive: Secondary | ICD-10-CM | POA: Diagnosis not present

## 2015-02-10 LAB — POCT URINE PREGNANCY: PREG TEST UR: NEGATIVE

## 2015-02-10 MED ORDER — MEDROXYPROGESTERONE ACETATE 150 MG/ML IM SUSP
150.0000 mg | Freq: Once | INTRAMUSCULAR | Status: AC
  Administered 2015-02-10: 150 mg via INTRAMUSCULAR

## 2015-02-10 NOTE — Progress Notes (Signed)
Pt here for Depo. Reports no problems at this time. Return in 12 weeks for next shot. JSY 

## 2015-03-30 ENCOUNTER — Other Ambulatory Visit: Payer: Self-pay | Admitting: Obstetrics & Gynecology

## 2015-05-05 ENCOUNTER — Ambulatory Visit: Payer: Medicaid Other | Admitting: *Deleted

## 2015-05-06 ENCOUNTER — Ambulatory Visit (INDEPENDENT_AMBULATORY_CARE_PROVIDER_SITE_OTHER): Payer: Medicaid Other | Admitting: *Deleted

## 2015-05-06 ENCOUNTER — Encounter: Payer: Self-pay | Admitting: *Deleted

## 2015-05-06 ENCOUNTER — Other Ambulatory Visit: Payer: Self-pay | Admitting: *Deleted

## 2015-05-06 DIAGNOSIS — Z3202 Encounter for pregnancy test, result negative: Secondary | ICD-10-CM

## 2015-05-06 DIAGNOSIS — Z3042 Encounter for surveillance of injectable contraceptive: Secondary | ICD-10-CM

## 2015-05-06 LAB — POCT URINE PREGNANCY: Preg Test, Ur: NEGATIVE

## 2015-05-06 MED ORDER — MEDROXYPROGESTERONE ACETATE 150 MG/ML IM SUSP
150.0000 mg | Freq: Once | INTRAMUSCULAR | Status: AC
  Administered 2015-05-06: 150 mg via INTRAMUSCULAR

## 2015-05-06 NOTE — Progress Notes (Signed)
Pt here for Depo. Reports no problems at this time. Return in 12 weeks for next shot. JSY 

## 2015-05-06 NOTE — Telephone Encounter (Signed)
Pt came in for Depo and is requesting a refill on Zoloft 25 mg. It has been about 1 month since she was on this med. Please advise. Thanks!! JSY

## 2015-05-07 ENCOUNTER — Other Ambulatory Visit: Payer: Self-pay | Admitting: Advanced Practice Midwife

## 2015-05-07 MED ORDER — SERTRALINE HCL 25 MG PO TABS: 25.0000 mg | ORAL_TABLET | Freq: Every day | ORAL | Status: DC

## 2015-05-07 NOTE — Telephone Encounter (Signed)
Pt informed Zoloft refill completed.

## 2015-05-07 NOTE — Telephone Encounter (Signed)
I sent refills Please let her know

## 2015-06-26 ENCOUNTER — Other Ambulatory Visit: Payer: Self-pay | Admitting: Obstetrics & Gynecology

## 2015-07-21 ENCOUNTER — Other Ambulatory Visit (HOSPITAL_COMMUNITY)
Admission: RE | Admit: 2015-07-21 | Discharge: 2015-07-21 | Disposition: A | Discharge status: Home / Self Care | Payer: Medicaid Other | Source: Ambulatory Visit | Attending: Advanced Practice Midwife | Admitting: Advanced Practice Midwife

## 2015-07-21 ENCOUNTER — Ambulatory Visit (INDEPENDENT_AMBULATORY_CARE_PROVIDER_SITE_OTHER): Payer: Medicaid Other | Admitting: Advanced Practice Midwife

## 2015-07-21 ENCOUNTER — Encounter: Payer: Self-pay | Admitting: Advanced Practice Midwife

## 2015-07-21 VITALS — BP 140/68 | HR 94 | Ht 64.0 in | Wt 273.0 lb

## 2015-07-21 DIAGNOSIS — Z01419 Encounter for gynecological examination (general) (routine) without abnormal findings: Secondary | ICD-10-CM | POA: Diagnosis not present

## 2015-07-21 DIAGNOSIS — Z1151 Encounter for screening for human papillomavirus (HPV): Secondary | ICD-10-CM | POA: Diagnosis not present

## 2015-07-21 DIAGNOSIS — Z0000 Encounter for general adult medical examination without abnormal findings: Secondary | ICD-10-CM

## 2015-07-21 DIAGNOSIS — Z113 Encounter for screening for infections with a predominantly sexual mode of transmission: Secondary | ICD-10-CM | POA: Insufficient documentation

## 2015-07-21 NOTE — Progress Notes (Signed)
Kristin [name] 34 y.o.  Filed Vitals:   07/21/15 1546  BP: 140/68  Pulse: 94     Past Medical History: Past Medical History  Diagnosis Date  . Migraines   . Vaginal Pap smear, abnormal   . Contraceptive management 11/05/2013    Past Surgical History: Past Surgical History  Procedure Laterality Date  . No past surgeries      Family History: Family History  Problem Relation Age of Onset  . Other Mother     enlarged heart  . Diabetes Mother   . Hypertension Mother   . Hyperlipidemia Mother   . Heart disease Mother     heart murmer  . Hypertension Father   . Hyperlipidemia Father   . Cancer Father     prostate  . Other Brother     enlarged heart; colloid cyst of the third ventricle( of the brain)  . Cancer Paternal Grandmother     leukemia  . Cancer Paternal Grandfather     lung  . Hypertension Sister     Social History: Social History  Substance Use Topics  . Smoking status: Never Smoker   . Smokeless tobacco: Never Used  . Alcohol Use: No     Comment: occ; not now    Allergies: No Known Allergies    Current outpatient prescriptions:  .  butalbital-acetaminophen-caffeine (FIORICET, ESGIC) 50-325-40 MG tablet, TAKE  (1)  TABLET  EVERY FOUR HOURS AS NEEDED FOR HEADACHE.    - MAY MAKE DROWSY -, Disp: 30 tablet, Rfl: 0 .  Butalbital-APAP-Caffeine (FIORICET PO), Take by mouth as needed., Disp: , Rfl:  .  Prenatal Vit-Fe Fumarate-FA (PREPLUS) 27-1 MG TABS, TAKE 1 TABLET BY MOUTH DAILY AT 12:00 NOON., Disp: 30 tablet, Rfl: 11 .  sertraline (ZOLOFT) 25 MG tablet, Take 1 tablet (25 mg total) by mouth daily., Disp: 30 tablet, Rfl: 11 .  medroxyPROGESTERone (DEPO-PROVERA) 150 MG/ML injection, Inject 1 mL (150 mg total) into the muscle every 3 (three) months. (Patient not taking: Reported on 07/21/2015), Disp: 1 mL, Rfl: 3  History of Present Illness: Here for pap. Has FP medicaid. Has gained a lot of weight with depo.  Wants IUD.  Started walking last week.   Discussed low carb   Review of Systems   Patient denies any headaches, blurred vision, shortness of breath, chest pain, abdominal pain, problems with bowel movements, urination, or intercourse.   Physical Exam: General:  Well developed, well nourished, no acute distress Skin:  Warm and dry Neck:  Midline trachea, normal thyroid Lungs; Clear to auscultation bilaterally Breast:  No dominant palpable mass, retraction, or nipple discharge Cardiovascular: Regular rate and rhythm Abdomen:  Soft, non tender, no hepatosplenomegaly Pelvic:  External genitalia is normal in appearance.  The vagina is normal in appearance.  The cervix is bulbous.  Uterus is felt to be normal size, shape, and contour.  No adnexal masses or tenderness noted, limited exam d/t body habitus Extremities:  No swelling or varicosities noted Psych:  No mood changes.     Impression: normal GYN exam     Plan: [place] next week

## 2015-07-23 LAB — CYTOLOGY - PAP

## 2015-07-30 ENCOUNTER — Ambulatory Visit (INDEPENDENT_AMBULATORY_CARE_PROVIDER_SITE_OTHER): Payer: Medicaid Other | Admitting: Advanced Practice Midwife

## 2015-07-30 ENCOUNTER — Ambulatory Visit: Payer: Medicaid Other

## 2015-07-30 ENCOUNTER — Encounter: Payer: Self-pay | Admitting: Advanced Practice Midwife

## 2015-07-30 VITALS — BP 104/70 | HR 74 | Wt 271.3 lb

## 2015-07-30 DIAGNOSIS — Z3202 Encounter for pregnancy test, result negative: Secondary | ICD-10-CM | POA: Diagnosis not present

## 2015-07-30 DIAGNOSIS — Z30014 Encounter for initial prescription of intrauterine contraceptive device: Secondary | ICD-10-CM | POA: Diagnosis not present

## 2015-07-30 DIAGNOSIS — Z30430 Encounter for insertion of intrauterine contraceptive device: Secondary | ICD-10-CM | POA: Diagnosis not present

## 2015-07-30 LAB — POCT URINE PREGNANCY: Preg Test, Ur: NEGATIVE

## 2015-07-30 NOTE — Patient Instructions (Signed)
Levonorgestrel intrauterine device (IUD) What is this medicine? LEVONORGESTREL IUD (LEE voe nor jes trel) is a contraceptive (birth control) device. The device is placed inside the uterus by a healthcare professional. It is used to prevent pregnancy and can also be used to treat heavy bleeding that occurs during your period. Depending on the device, it can be used for 3 to 5 years. This medicine may be used for other purposes; ask your health care provider or pharmacist if you have questions. What should I tell my health care provider before I take this medicine? They need to know if you have any of these conditions: -abnormal Pap smear -cancer of the breast, uterus, or cervix -diabetes -endometritis -genital or pelvic infection now or in the past -have more than one sexual partner or your partner has more than one partner -heart disease -history of an ectopic or tubal pregnancy -immune system problems -IUD in place -liver disease or tumor -problems with blood clots or take blood-thinners -use intravenous drugs -uterus of unusual shape -vaginal bleeding that has not been explained -an unusual or allergic reaction to levonorgestrel, other hormones, silicone, or polyethylene, medicines, foods, dyes, or preservatives -pregnant or trying to get pregnant -breast-feeding How should I use this medicine? This device is placed inside the uterus by a health care professional. Talk to your pediatrician regarding the use of this medicine in children. Special care may be needed. Overdosage: If you think you have taken too much of this medicine contact a poison control center or emergency room at once. NOTE: This medicine is only for you. Do not share this medicine with others. What if I miss a dose? This does not apply. What may interact with this medicine? Do not take this medicine with any of the following medications: -amprenavir -bosentan -fosamprenavir This medicine may also interact with  the following medications: -aprepitant -barbiturate medicines for inducing sleep or treating seizures -bexarotene -griseofulvin -medicines to treat seizures like carbamazepine, ethotoin, felbamate, oxcarbazepine, phenytoin, topiramate -modafinil -pioglitazone -rifabutin -rifampin -rifapentine -some medicines to treat HIV infection like atazanavir, indinavir, lopinavir, nelfinavir, tipranavir, ritonavir -St. John's wort -warfarin This list may not describe all possible interactions. Give your health care provider a list of all the medicines, herbs, non-prescription drugs, or dietary supplements you use. Also tell them if you smoke, drink alcohol, or use illegal drugs. Some items may interact with your medicine. What should I watch for while using this medicine? Visit your doctor or health care professional for regular check ups. See your doctor if you or your partner has sexual contact with others, becomes HIV positive, or gets a sexual transmitted disease. This product does not protect you against HIV infection (AIDS) or other sexually transmitted diseases. You can check the placement of the IUD yourself by reaching up to the top of your vagina with clean fingers to feel the threads. Do not pull on the threads. It is a good habit to check placement after each menstrual period. Call your doctor right away if you feel more of the IUD than just the threads or if you cannot feel the threads at all. The IUD may come out by itself. You may become pregnant if the device comes out. If you notice that the IUD has come out use a backup birth control method like condoms and call your health care provider. Using tampons will not change the position of the IUD and are okay to use during your period. What side effects may I notice from receiving this medicine?   Side effects that you should report to your doctor or health care professional as soon as possible: -allergic reactions like skin rash, itching or  hives, swelling of the face, lips, or tongue -fever, flu-like symptoms -genital sores -high blood pressure -no menstrual period for 6 weeks during use -pain, swelling, warmth in the leg -pelvic pain or tenderness -severe or sudden headache -signs of pregnancy -stomach cramping -sudden shortness of breath -trouble with balance, talking, or walking -unusual vaginal bleeding, discharge -yellowing of the eyes or skin Side effects that usually do not require medical attention (report to your doctor or health care professional if they continue or are bothersome): -acne -breast pain -change in sex drive or performance -changes in weight -cramping, dizziness, or faintness while the device is being inserted -headache -irregular menstrual bleeding within first 3 to 6 months of use -nausea This list may not describe all possible side effects. Call your doctor for medical advice about side effects. You may report side effects to FDA at 1-800-FDA-1088. Where should I keep my medicine? This does not apply. NOTE: This sheet is a summary. It may not cover all possible information. If you have questions about this medicine, talk to your doctor, pharmacist, or health care provider.    2016, Elsevier/Gold Standard. (2011-10-20 13:54:04)  

## 2015-07-30 NOTE — Progress Notes (Signed)
Kristin [name] is a 34 y.o. year old  female   who presents for placement of a Mirena IUD. She is on depo and her pregnancy test today is negative.    The risks and benefits of the method and placement have been thouroughly reviewed with the patient and all questions were answered.  Specifically the patient is aware of failure rate of 10/998, expulsion of the IUD and of possible perforation.  The patient is aware of irregular bleeding due to the method and understands the incidence of irregular bleeding diminishes with time.  Time out was performed.  A Graves speculum was placed.  The cervix was prepped using Betadine. The uterus was found to be neutral and it sounded to 7 cm.  The cervix was grasped with a tenaculum and the IUD was inserted to 7 cm.  It was pulled back 1 cm and the IUD was disengaged.  The strings were trimmed to 3 cm.  Sonogram was performed and the proper placement of the IUD was verified.  The patient was instructed on signs and symptoms of infection and to check for the strings after each menses or each month.  The patient is to refrain from intercourse for 3 days.  The patient is scheduled for a return appointment after her first menses or 4 weeks.  CRESENZO-DISHMAN,Nikayla Madaris 07/30/2015 4:36 PM

## 2015-08-26 ENCOUNTER — Encounter: Payer: Self-pay | Admitting: Advanced Practice Midwife

## 2015-08-26 ENCOUNTER — Ambulatory Visit: Payer: Medicaid Other | Admitting: Advanced Practice Midwife

## 2015-09-09 ENCOUNTER — Ambulatory Visit (INDEPENDENT_AMBULATORY_CARE_PROVIDER_SITE_OTHER): Payer: Medicaid Other | Admitting: Advanced Practice Midwife

## 2015-09-09 ENCOUNTER — Encounter: Payer: Self-pay | Admitting: Advanced Practice Midwife

## 2015-09-09 VITALS — BP 110/70 | HR 76 | Wt 269.3 lb

## 2015-09-09 DIAGNOSIS — Z30431 Encounter for routine checking of intrauterine contraceptive device: Secondary | ICD-10-CM

## 2015-09-09 MED ORDER — SERTRALINE HCL 50 MG PO TABS: 50.0000 mg | ORAL_TABLET | Freq: Every day | ORAL | Status: DC

## 2015-09-09 NOTE — Progress Notes (Signed)
   [facility]  Patient name: Kristin [name] MRN [id number]  Date of birth: 09/15/1981  CC & HPI:  Kristin IvanMiranda Noguchi is a 34 y.o. African American female presenting today for IUD check. She got Mirena 6 weeks ago. She had intermittent bleeding for 3 weeks, none now.  She has had cramping, but not bad enough to take meds for Does not check strings, partner can't feel them.  She is on zoloft 25 mg for depression/moodiness.  She feels like she needs to increase the dosage, as she has been "snappy" lately.   Pertinent History Reviewed:  Medical & Surgical Hx:   Past Medical History  Diagnosis Date  . Migraines   . Vaginal Pap smear, abnormal   . Contraceptive management 11/05/2013   Past Surgical History  Procedure Laterality Date  . No past surgeries     Family History  Problem Relation Age of Onset  . Other Mother     enlarged heart  . Diabetes Mother   . Hypertension Mother   . Hyperlipidemia Mother   . Heart disease Mother     heart murmer  . Hypertension Father   . Hyperlipidemia Father   . Cancer Father     prostate  . Other Brother     enlarged heart; colloid cyst of the third ventricle( of the brain)  . Cancer Paternal Grandmother     leukemia  . Cancer Paternal Grandfather     lung  . Hypertension Sister     Current outpatient prescriptions:  .  Prenatal Vit-Fe Fumarate-FA (PREPLUS) 27-1 MG TABS, TAKE 1 TABLET BY MOUTH DAILY AT 12:00 NOON., Disp: 30 tablet, Rfl: 11 .  butalbital-acetaminophen-caffeine (FIORICET, ESGIC) 50-325-40 MG tablet, TAKE  (1)  TABLET  EVERY FOUR HOURS AS NEEDED FOR HEADACHE.    - MAY MAKE DROWSY - (Patient not taking: Reported on 09/09/2015), Disp: 30 tablet, Rfl: 0 .  sertraline (ZOLOFT) 50 MG tablet, Take 1 tablet (50 mg total) by mouth daily., Disp: 30 tablet, Rfl: 6 Social History: Reviewed -  reports that she has never smoked. She has never used smokeless tobacco.  Review of Systems:   Constitutional: Negative for fever  and chills Eyes: Negative for visual disturbances Respiratory: Negative for shortness of breath, dyspnea Cardiovascular: Negative for chest pain or palpitations  Gastrointestinal: Negative for vomiting, diarrhea and constipation; no abdominal pain Genitourinary: Negative for dysuria and urgency, vaginal irritation or itching Musculoskeletal: Negative for back pain, joint pain, myalgias  Neurological: Negative for dizziness and headaches    Objective Findings:    Physical Examination: General appearance - well appearing, and in no distress Mental status - alert, oriented to person, place, and time Chest:  Normal respiratory effort Heart - normal rate and regular rhythm Abdomen:  Soft, nontender Pelvic: SSE:  Strings visible,  Normal appearing dc Musculoskeletal:  Normal range of motion without pain Extremities:  No edema    No results found for this or any previous visit (from the past 24 hour(s)).    Assessment & Plan:  A:   IUD check  Depression P:  Check strings q month  Zoloft increased to 50mg /day   Return if symptoms worsen or fail to improve.  CRESENZO-DISHMAN,Taite Schoeppner CNM 09/09/2015 4:26 PM

## 2015-10-13 ENCOUNTER — Telehealth: Payer: Self-pay | Admitting: *Deleted

## 2015-10-13 MED ORDER — SERTRALINE HCL 100 MG PO TABS: 100.0000 mg | ORAL_TABLET | Freq: Every day | ORAL | Status: DC

## 2015-10-13 NOTE — Telephone Encounter (Signed)
Increase zoloft to 100mg daily

## 2015-10-13 NOTE — Telephone Encounter (Signed)
Pt informed Zoloft 100 mg e-scribed.

## 2016-01-06 ENCOUNTER — Telehealth: Payer: Self-pay | Admitting: Advanced Practice Midwife

## 2016-01-06 NOTE — Telephone Encounter (Signed)
Pt got IUD in October, states that she had regular period every month except for March since IUD was placed. Pt states that she did not experience any break through bleeding at all. Pt states that her periods had gotten much lighter. Pt advised this could be normal with her IUD, but if she wanted to take a home pregnancy test that would be ok. Pt was also advised that we could make her an appointment if she thought she should be seen. Pt stated that she would start with the Home pregnancy test and go from there.

## 2016-01-12 ENCOUNTER — Ambulatory Visit: Payer: Medicaid Other | Admitting: Adult Health

## 2016-01-13 ENCOUNTER — Ambulatory Visit (INDEPENDENT_AMBULATORY_CARE_PROVIDER_SITE_OTHER): Payer: Medicaid Other | Admitting: Adult Health

## 2016-01-13 ENCOUNTER — Encounter: Payer: Self-pay | Admitting: Adult Health

## 2016-01-13 VITALS — BP 120/70 | HR 76 | Ht 64.0 in | Wt 268.0 lb

## 2016-01-13 DIAGNOSIS — Z3202 Encounter for pregnancy test, result negative: Secondary | ICD-10-CM

## 2016-01-13 DIAGNOSIS — N926 Irregular menstruation, unspecified: Secondary | ICD-10-CM | POA: Diagnosis not present

## 2016-01-13 DIAGNOSIS — Z975 Presence of (intrauterine) contraceptive device: Secondary | ICD-10-CM | POA: Diagnosis not present

## 2016-01-13 HISTORY — DX: Irregular menstruation, unspecified: N92.6

## 2016-01-13 HISTORY — DX: Presence of (intrauterine) contraceptive device: Z97.5

## 2016-01-13 LAB — POCT URINE PREGNANCY: PREG TEST UR: NEGATIVE

## 2016-01-13 NOTE — Progress Notes (Signed)
Subjective:     Patient ID: Kristin [name], female   DOB: 09/03/1981, 35 y.o.   MRN: [id number]  HPI Kristin [name] is a 35 year old black female in for UPT, has missed a period, has IUD, since October 2016 was on depo before that.   Review of Systems Patient denies any headaches, hearing loss, fatigue, blurred vision, shortness of breath, chest pain, abdominal pain, problems with bowel movements, urination, or intercourse. No joint pain or mood swings.+ missed period Reviewed past medical,surgical, social and family history. Reviewed medications and allergies.     Objective:   Physical Exam BP 120/70 mmHg  Pulse 76  Ht 5\' 4"  (1.626 m)  Wt 268 lb (121.564 kg)  BMI 45.98 kg/m2  LMP 11/23/2015 UPT negative, Skin warm and dry. Lungs: clear to ausculation bilaterally. Cardiovascular: regular rate and rhythm.Abdomens is soft and non tender. Pelvic: external genitalia is normal in appearance no lesions, vagina: white discharge without odor,urethra has no lesions or masses noted, cervix:smooth and bulbous,+IUD string seen, uterus: normal size, shape and contour, non tender, no masses felt, adnexa: no masses or tenderness noted. Bladder is non tender and no masses felt. Discussed was not unusual to not have a period with mirena IUD.encouraged to check strings and she thinks she feels them. Face time 15 minutes with 50 % counseling about IUD.    Assessment:     Missed periods UPT negative IUD in place     Plan:     Check strings IUD in place Follow up prn

## 2016-01-13 NOTE — Patient Instructions (Signed)
Follow up prn Check strings

## 2016-03-28 ENCOUNTER — Encounter: Payer: Self-pay | Admitting: Obstetrics and Gynecology

## 2016-03-28 ENCOUNTER — Ambulatory Visit (INDEPENDENT_AMBULATORY_CARE_PROVIDER_SITE_OTHER): Payer: Medicaid Other | Admitting: Obstetrics and Gynecology

## 2016-03-28 VITALS — BP 142/90 | Ht 64.0 in | Wt 267.0 lb

## 2016-03-28 DIAGNOSIS — Z113 Encounter for screening for infections with a predominantly sexual mode of transmission: Secondary | ICD-10-CM | POA: Diagnosis not present

## 2016-03-28 DIAGNOSIS — Z139 Encounter for screening, unspecified: Secondary | ICD-10-CM

## 2016-03-28 NOTE — Progress Notes (Signed)
Patient ID: Kristin [name], female   DOB: 05/20/1981, 35 y.o.   MRN: [id number] Pt here today for STD screening. Pt denies any symptoms of anything but she just wants to be checked.

## 2016-03-28 NOTE — Progress Notes (Signed)
Patient ID: Kristin [name], female   DOB: 08/28/1981, 35 y.o.   MRN: [id number]   [facility]  @DATE @            Patient name: Kristin IvanMiranda [name] MRN [id number]  Date of birth: 04/29/1981  CC & HPI:  Kristin IvanMiranda [name] is a 35 y.o. female presenting today for STD check. She reports she has had recent unprotected sex with her husband and would like to make sure she does not have any STIs. She only complains of mild vaginal itching. She denies any abdominal pain, dysuria, vaginal discharge or bleeding.   ROS:  Review of Systems  Gastrointestinal: Negative for abdominal pain.  Genitourinary: Negative for dysuria.       +vaginal itching -vaginal bleeding or discharge  All other systems reviewed and are negative.   Pertinent History Reviewed:   Reviewed Medical         Past Medical History  Diagnosis Date  . Migraines   . Vaginal Pap smear, abnormal   . Contraceptive management 11/05/2013  . Missed periods 01/13/2016  . IUD (intrauterine device) in place 01/13/2016                              Surgical Hx:    Past Surgical History  Procedure Laterality Date  . No past surgeries     Medications: Reviewed & Updated - see associated section                       Current outpatient prescriptions:  .  rizatriptan (MAXALT) 10 MG tablet, Take 10 mg by mouth as needed for migraine. May repeat in 2 hours if needed, Disp: , Rfl:  .  sertraline (ZOLOFT) 100 MG tablet, Take 1 tablet (100 mg total) by mouth daily. (Patient not taking: Reported on 03/28/2016), Disp: 30 tablet, Rfl: 6   Social History: Reviewed -  reports that she has never smoked. She has never used smokeless tobacco.  Objective Findings:  Vitals: Blood pressure 142/90, height 5\' 4"  (1.626 m), weight 267 lb (121.11 kg), not currently breastfeeding.  Physical Examination: General appearance - alert, well appearing, and in no distress Mental status - alert, oriented to person, place, and time Abdomen - soft,  nontender, nondistended, no masses or organomegaly Pelvic -  VULVA: normal appearing vulva with no masses, tenderness or lesions,  VAGINA: normal appearing vagina with normal color and discharge, no lesions,  CERVIX: normal appearing cervix without discharge or lesions Musculoskeletal - no joint tenderness, deformity or swelling Extremities - peripheral pulses normal, no pedal edema, no clubbing or cyanosis Skin - normal coloration and turgor, no rashes, no suspicious skin lesions noted   Assessment & Plan:   A:  1. Encounter for STD screening  2. GC/CHL collected  P:  1. Will order HIV, RPR  2. F/u PRN     By signing my name below, I, Doreatha MartinEva Mathews, attest that this documentation has been prepared under the direction and in the presence of Tilda BurrowJohn V Bora Bost, MD. Electronically Signed: Doreatha MartinEva Mathews, ED Scribe. 03/28/2016. 12:21 PM.  .I personally performed the services described in this documentation, which was SCRIBED in my presence. The recorded information has been reviewed and considered accurate. It has been edited as necessary during review. Tilda BurrowFERGUSON,Jaevian Shean V, MD  I

## 2016-03-29 LAB — HIV-1 RNA QUANT-NO REFLEX-BLD: HIV-1 RNA Viral Load: <20 copies/mL

## 2016-03-29 LAB — RPR: RPR Ser Ql: NONREACTIVE

## 2016-03-29 LAB — GC/CHLAMYDIA PROBE AMP
Chlamydia trachomatis, NAA: NEGATIVE
Neisseria gonorrhoeae by PCR: NEGATIVE

## 2016-03-31 ENCOUNTER — Telehealth: Payer: Self-pay | Admitting: *Deleted

## 2016-03-31 NOTE — Telephone Encounter (Signed)
Pt aware that Dr.Ferguson has not reviewed labs yet and I wanted him to look over everything before giving the results. Pt aware that D.r Emelda FearFerguson would be back in the office in the morning and that I would have him review them and then call her with her results.

## 2016-04-04 ENCOUNTER — Telehealth: Payer: Self-pay | Admitting: Obstetrics and Gynecology

## 2016-04-04 DIAGNOSIS — Z113 Encounter for screening for infections with a predominantly sexual mode of transmission: Secondary | ICD-10-CM

## 2016-04-04 NOTE — Telephone Encounter (Signed)
Pt aware of results and aware that HIV lab was not correct lab. Pt aware to come by when she can to have the lab drawn. Lab order put in and taken to lab. Pt aware.

## 2016-04-04 NOTE — Telephone Encounter (Signed)
Pt called stating that she is returning ashley's phone call. Please contact pt °

## 2016-04-15 ENCOUNTER — Other Ambulatory Visit: Payer: Self-pay | Admitting: Obstetrics & Gynecology

## 2016-11-08 DIAGNOSIS — Z23 Encounter for immunization: Secondary | ICD-10-CM | POA: Diagnosis not present

## 2016-11-16 DIAGNOSIS — J069 Acute upper respiratory infection, unspecified: Secondary | ICD-10-CM | POA: Diagnosis not present

## 2016-11-18 DIAGNOSIS — J0100 Acute maxillary sinusitis, unspecified: Secondary | ICD-10-CM | POA: Diagnosis not present

## 2017-01-19 DIAGNOSIS — R928 Other abnormal and inconclusive findings on diagnostic imaging of breast: Secondary | ICD-10-CM | POA: Diagnosis not present

## 2017-02-06 DIAGNOSIS — R070 Pain in throat: Secondary | ICD-10-CM | POA: Diagnosis not present

## 2017-02-06 DIAGNOSIS — J029 Acute pharyngitis, unspecified: Secondary | ICD-10-CM | POA: Diagnosis not present

## 2017-04-19 ENCOUNTER — Encounter: Payer: Self-pay | Admitting: Advanced Practice Midwife

## 2017-04-19 ENCOUNTER — Ambulatory Visit (INDEPENDENT_AMBULATORY_CARE_PROVIDER_SITE_OTHER): Payer: BLUE CROSS/BLUE SHIELD | Admitting: Advanced Practice Midwife

## 2017-04-19 VITALS — BP 128/76 | HR 88 | Ht 64.0 in | Wt 289.0 lb

## 2017-04-19 DIAGNOSIS — Z3009 Encounter for other general counseling and advice on contraception: Secondary | ICD-10-CM | POA: Diagnosis not present

## 2017-04-19 DIAGNOSIS — Z01419 Encounter for gynecological examination (general) (routine) without abnormal findings: Secondary | ICD-10-CM

## 2017-04-19 DIAGNOSIS — Z113 Encounter for screening for infections with a predominantly sexual mode of transmission: Secondary | ICD-10-CM

## 2017-04-19 DIAGNOSIS — R002 Palpitations: Secondary | ICD-10-CM

## 2017-04-19 MED ORDER — SERTRALINE HCL 50 MG PO TABS: 50.0000 mg | ORAL_TABLET | Freq: Every day | ORAL | 11 refills | Status: DC

## 2017-04-19 NOTE — Addendum Note (Signed)
Addended by: Tish FredericksonLANCASTER, Borden Thune A on: 04/19/2017 01:50 PM   Modules accepted: Orders

## 2017-04-19 NOTE — Patient Instructions (Signed)
[facility].  [phone]

## 2017-04-19 NOTE — Progress Notes (Signed)
Kristin [name] 36 y.o.  Vitals:   04/19/17 1026  BP: 128/76  Pulse: 88     Filed Weights   04/19/17 1026  Weight: 289 lb (131.1 kg)    Past Medical History: Past Medical History:  Diagnosis Date  . Contraceptive management 11/05/2013  . IUD (intrauterine device) in place 01/13/2016  . Migraines   . Missed periods 01/13/2016  . Vaginal Pap smear, abnormal     Past Surgical History: Past Surgical History:  Procedure Laterality Date  . NO PAST SURGERIES      Family History: Family History  Problem Relation Age of Onset  . Other Mother        enlarged heart  . Diabetes Mother   . Hypertension Mother   . Hyperlipidemia Mother   . Heart disease Mother        heart murmer  . Hypertension Father   . Hyperlipidemia Father   . Cancer Father        prostate  . Other Brother        enlarged heart; colloid cyst of the third ventricle( of the brain)  . Cancer Paternal Grandmother        leukemia  . Cancer Paternal Grandfather        lung  . Hypertension Sister     Social History: Social History  Substance Use Topics  . Smoking status: Never Smoker  . Smokeless tobacco: Never Used  . Alcohol use No     Comment: occ; not now    Allergies: No Known Allergies      Current Outpatient Prescriptions:  .  Prenatal Vit-Fe Fumarate-FA (PREPLUS) 27-1 MG TABS, TAKE 1 TABLET BY MOUTH DAILY AT 12:00 NOON. (Patient not taking: Reported on 04/19/2017), Disp: 30 tablet, Rfl: 11 .  rizatriptan (MAXALT) 10 MG tablet, Take 10 mg by mouth as needed for migraine. May repeat in 2 hours if needed, Disp: , Rfl:   History of Present Illness: here for FP physical (also has [employer]).  Stopped zoloft 8 months aog, wants to restart.  Husband cheated, separated since January. Living on parent's couch while trying to save money.  Has therapy appt next week.  Heart flutters q day, ankle swelling.  Stress eats, has gained wieght   Review of Systems   Patient denies any headaches, blurred  vision, shortness of breath, chest pain, abdominal pain, problems with bowel movements, urination  Physical Exam: General:  Well developed, well nourished, no acute distress Skin:  Warm and dry Neck:  Midline trachea, normal thyroid Lungs; Clear to auscultation bilaterally Breast:  No dominant palpable mass, retraction, or nipple discharge.  [facility]og[facility] for "extra tissue"?? Cardiovascular: Regular rate and rhythm Abdomen:  Soft, non tender, no hepatosplenomegaly Pelvic:  External genitalia is normal in appearance.  The vagina is normal in appearance.  The cervix is bulbous.  Uterus is felt to be normal size, shape, and contour.  No adnexal masses or tenderness noted. Exam limited by habitus Extremities:  No swelling or varicosities noted Psych:  No mood changes.     Impression: normal GYN exam Depression Heart palpitaions     Plan: Pap q 3 years STD testing d/t ? FP requirements Referral to cardiologist sent (pt to check w/ins regarding need for PCP to make it vs me--[facility] Zoloft (startr at 2136m and work up to 50-100) Keep appt w/therapist Mammograms q 6 months at [place] as needed per radiologist Low carb  when ready to lose weight

## 2017-04-22 LAB — GC/CHLAMYDIA PROBE AMP
Chlamydia trachomatis, NAA: NEGATIVE
NEISSERIA GONORRHOEAE BY PCR: NEGATIVE

## 2017-05-11 DIAGNOSIS — G43009 Migraine without aura, not intractable, without status migrainosus: Secondary | ICD-10-CM | POA: Diagnosis not present

## 2017-05-14 NOTE — Progress Notes (Signed)
Cardiology Office Note   Date:  05/15/2017   ID:  Kristin IvanMiranda Throne, DOB 03/09/1981, MRN [id number]  PCP:  Eustace MooreNelson, Yvonne Sue, MD  Cardiologist:   Charlton HawsPeter Marvette Schamp, MD   No chief complaint on file.     History of Present Illness: Kristin [name] is a 36 y.o. female who presents for consultation regarding palpitations. Referred by Cheron EveryLindsey Strader PA  Complained of palpitations during Ob/GYN appt.  Takes zoloft for anxiety/depression Separated due to infidelity January. Living with parents Seeing behavioral health Having daily heart flutters Stress eating and gained weight Her Zoloft dose has been increased twice last year. 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 No history of DCM, syncope dysautonomia. Palpitations are benign sounding and clearly related to stress Denies excess ETOH, drugs stimulants or diet pills   Also stressed [facility] 36 [facility] Migrains worse last week  Past Medical History:  Diagnosis Date  . Contraceptive management 11/05/2013  . IUD (intrauterine device) in place 01/13/2016  . Migraines   . Missed periods 01/13/2016  . Vaginal Pap smear, abnormal     Past Surgical History:  Procedure Laterality Date  . NO PAST SURGERIES       Current Outpatient Prescriptions  Medication Sig Dispense Refill  . Prenatal Vit-Fe Fumarate-FA (PREPLUS) 27-1 MG TABS TAKE 1 TABLET BY MOUTH DAILY AT 12:00 NOON. 30 tablet 11  . sertraline (ZOLOFT) 50 MG tablet Take 1 tablet (50 mg total) by mouth daily. 30 tablet 11  . propranolol (INDERAL) 10 MG tablet Take 1 tablet (10 mg total) by mouth daily as needed. 30 tablet 3   No current facility-administered medications for this visit.     Allergies:   Patient has no known allergies.    Social History:  The patient  reports that she has never smoked. She has never used smokeless tobacco. She reports that she does not drink alcohol or  use drugs.   Family History:  The patient's family history includes Cancer in her father, paternal grandfather, and paternal grandmother; Diabetes in her mother; Heart disease in her mother; Hyperlipidemia in her father and mother; Hypertension in her father, mother, and sister; Other in her brother and mother.    ROS:  Please see the history of present illness.   Otherwise, review of systems are positive for none.   All other systems are reviewed and negative.    PHYSICAL EXAM: VS:  BP 120/80 (BP Location: Right Arm)   Pulse 76   Ht 5\' 4"  (1.626 m)   Wt 287 lb (130.2 kg)   SpO2 98%   BMI 49.26 kg/m  , BMI Body mass index is 49.26 kg/m. Affect appropriate Obese stressed black female  HEENT: normal Neck supple with no adenopathy JVP normal no bruits no thyromegaly Lungs clear with no wheezing and good diaphragmatic motion Heart:  S1/S2 no murmur, no rub, gallop or click PMI normal Abdomen: benighn, BS positve, no tenderness, no AAA no bruit.  No HSM or HJR Distal pulses intact with no bruits No edema Neuro non-focal Skin warm and dry No muscular weakness    EKG:  SR low voltage due to body habitus normal   Recent Labs: No results found for requested labs within last 8760 hours.    Lipid Panel No results found for: CHOL, TRIG, HDL, CHOLHDL, VLDL, LDLCALC, LDLDIRECT    Wt  Readings from Last 3 Encounters:  05/15/17 287 lb (130.2 kg)  04/19/17 289 lb (131.1 kg)  03/28/16 267 lb (121.1 kg)      Other studies Reviewed: Additional studies/ records that were reviewed today include: Notes OB/GYn and primary .    ASSESSMENT AND PLAN:  1.  Palpitations benign related to anxiety/deprssion and stress as well as weight gain. F/U primary for labs and TSH PRN inderal 10 mg called in "valium for body". She will call us if she wants and event monitor  2. Anxiety / Depression Continue Zoloft Situational Unfortunately with a 2 and 38 yo as well as 28 yo and sisters child with  ongoing divorce her circumstances will not change soon  3. Migraines f/u primary Maxalt as needed   Current medicines are reviewed at length with the patient today.  The patient does not have concerns regarding medicines.  The following changes have been made:  no change  Labs/ tests ordered today include: Event monitor labs including TSH/CBC BMET  Orders Placed This Encounter  Procedures  . EKG 12-Lead     Disposition:   FU with cardiology PRN      Signed, Charlton Haws, MD  05/15/2017 4:15 PM    [facility] [address] [place], [place], [place]  [address] Phone: [phone]; Fax: [phone]

## 2017-05-15 ENCOUNTER — Encounter: Payer: Self-pay | Admitting: Cardiovascular Disease

## 2017-05-15 ENCOUNTER — Ambulatory Visit (INDEPENDENT_AMBULATORY_CARE_PROVIDER_SITE_OTHER): Payer: BLUE CROSS/BLUE SHIELD | Admitting: Cardiovascular Disease

## 2017-05-15 VITALS — BP 120/80 | HR 76 | Ht 64.0 in | Wt 287.0 lb

## 2017-05-15 DIAGNOSIS — R002 Palpitations: Secondary | ICD-10-CM | POA: Diagnosis not present

## 2017-05-15 MED ORDER — PROPRANOLOL HCL 10 MG PO TABS: 10.0000 mg | ORAL_TABLET | Freq: Every day | ORAL | 3 refills | Status: DC | PRN

## 2017-05-15 NOTE — Patient Instructions (Signed)
Medication Instructions:  START INDERAL 10 MG - TAKE DAILY AS NEEDED    Labwork: NONE  Testing/Procedures: NONE  Follow-Up: Your physician recommends that you schedule a follow-up appointment in: AS NEEDED    Any Other Special Instructions Will Be Listed Below (If Applicable).     If you need a refill on your cardiac medications before your next appointment, please call your pharmacy.

## 2017-06-23 ENCOUNTER — Ambulatory Visit (INDEPENDENT_AMBULATORY_CARE_PROVIDER_SITE_OTHER): Payer: BLUE CROSS/BLUE SHIELD | Admitting: Family Medicine

## 2017-06-23 ENCOUNTER — Encounter: Payer: Self-pay | Admitting: Family Medicine

## 2017-06-23 VITALS — BP 110/68 | HR 80 | Temp 98.1°F | Resp 16 | Ht 64.0 in | Wt 284.1 lb

## 2017-06-23 DIAGNOSIS — R5383 Other fatigue: Secondary | ICD-10-CM

## 2017-06-23 DIAGNOSIS — Z23 Encounter for immunization: Secondary | ICD-10-CM

## 2017-06-23 NOTE — Patient Instructions (Addendum)
Try to walk daily Continue under care Bariatric doctors If cost is an issue, I will see about referral to [facility] doctor See me yearly Call sooner for problems Blood tests today We will fax them to you on Monday.  Call if you have not gotten them by noon

## 2017-06-23 NOTE — Progress Notes (Signed)
Chief Complaint  Patient presents with  . Migraine   New patient Wants to discuss obesity She is going to a bariatric clinic in [place], cash only, and is getting phentiramine.  Tolerates it well.  Is losing weight.  It is expensive, and she is due for labs.  They also give her B12 shots and Hcg.  She is morbidly obese and unhappy with her weight.  Wants to give diet a try before thinking about a gastric bypass.  No health issues - yet - but is having mobility problems and joint pains in knees, ankles.   Previously took sertraline foe depression.  Stopped on her won.  Feels better Is going through divorce and is raising 3 children.  Currently lives with her parents. She used to walk daily but has slacked off - is really encouraged to exercise again as it will really enhance her weight loss effort. Had been fatigued - a little better on the phentermine. Prior history of migraines.  None lately. Sees GYN yearly, has an IUD    Patient Active Problem List   Diagnosis Date Noted  . IUD (intrauterine device) in place 01/13/2016  . Migraines 03/07/2013    Outpatient Encounter Prescriptions as of 06/23/2017  Medication Sig  . phentermine 15 MG capsule Take 15 mg by mouth every morning.   No facility-administered encounter medications on file as of 06/23/2017.     Past Medical History:  Diagnosis Date  . Allergy   . Contraceptive management 11/05/2013  . Depression   . GERD (gastroesophageal reflux disease)   . IUD (intrauterine device) in place 01/13/2016  . Migraines   . Missed periods 01/13/2016  . Vaginal Pap smear, abnormal     Past Surgical History:  Procedure Laterality Date  . NO PAST SURGERIES      Social History   Social History  . Marital status: Legally Separated    Spouse name: N/A  . Number of children: 3  . Years of education: 14   Occupational History  . program assistant     [employer]   Social History Main Topics  . Smoking status: Never Smoker    . Smokeless tobacco: Never Used  . Alcohol use No  . Drug use: No  . Sexual activity: Not Currently    Birth control/ protection: IUD   Other Topics Concern  . Not on file   Social History Narrative   Lives with parents   Looking for own place   Tries to walk daily    Family History  Problem Relation Age of Onset  . Other Mother        enlarged heart  . Diabetes Mother   . Hypertension Mother   . Hyperlipidemia Mother   . Heart disease Mother        heart murmer  . Miscarriages / [place] Mother   . Hypertension Father   . Hyperlipidemia Father   . Cancer Father        prostate  . Other Brother        enlarged heart; colloid cyst of the third ventricle( of the brain)  . Cancer Paternal Grandmother        leukemia  . Cancer Paternal Grandfather        lung  . Hypertension Sister     Review of Systems  Constitutional: Positive for malaise/fatigue. Negative for chills, fever and weight loss.  HENT: Negative for congestion and hearing loss.   Eyes: Negative for blurred  vision and pain.  Respiratory: Negative for cough and shortness of breath.   Cardiovascular: Negative for chest pain and leg swelling.  Gastrointestinal: Negative for abdominal pain, constipation, diarrhea and heartburn.  Genitourinary: Negative for dysuria and frequency.  Musculoskeletal: Positive for joint pain. Negative for falls and myalgias.  Neurological: Negative for dizziness, seizures and headaches.  Psychiatric/Behavioral: Negative for depression. The patient is not nervous/anxious and does not have insomnia.     BP 110/68 (BP Location: Right Arm, Patient Position: Sitting, Cuff Size: Large)   Pulse 80   Temp 98.1 F (36.7 C) (Temporal)   Resp 16   Ht  (1.626 m)   Wt 284 lb 1.9 oz (128.9 kg)   SpO2 100%   BMI 48.77 kg/m   Physical Exam  Constitutional: She is oriented to person, place, and time. She appears distressed.  Pleasant.  Super obese.  Upset about weight.  HENT:   Head: Normocephalic and atraumatic.  Mouth/Throat: Oropharynx is clear and moist.  Eyes: Pupils are equal, round, and reactive to light. Conjunctivae are normal.  Neck: Normal range of motion. Neck supple. No thyromegaly present.  Cardiovascular: Normal rate, regular rhythm and normal heart sounds.   Pulmonary/Chest: Effort normal and breath sounds normal. She has no rales.  Abdominal: Soft. Bowel sounds are normal. There is no tenderness.  Musculoskeletal: Normal range of motion. She exhibits no edema.  Neurological: She is alert and oriented to person, place, and time.  Psychiatric: She has a normal mood and affect. Her behavior is normal. Thought content normal.  ASSESSMENT/PLAN:  1. Morbid obesity (HCC) - Hemoglobin A1c - COMPLETE METABOLIC PANEL WITH GFR - CBC - Vitamin B12 - VITAMIN D 25 Hydroxy (Vit-D Deficiency, Fractures) - TSH - Lipid panel  2. Need for influenza vaccination - Flu Vaccine QUAD 36+ mos IM  3. Fatigue, unspecified type  Greater than 50% of this visit was spent in counseling and coordinating care.  Total face to face time:   30 min discussing lifestyle, diet, porions, snacking, risks and benefits of weight loss surgery  Patient Instructions  Try to walk daily Continue under care Bariatric doctors If cost is an issue, I will see about referral to [facility] doctor See me yearly Call sooner for problems Blood tests today We will fax them to you on Monday.  Call if you have not gotten them by noon   Eustace Moore, MD

## 2017-06-24 LAB — CBC
HCT: 38.8 % (ref 35.0–45.0)
Hemoglobin: 12.4 g/dL (ref 11.7–15.5)
MCH: 25.7 pg — AB (ref 27.0–33.0)
MCHC: 32 g/dL (ref 32.0–36.0)
MCV: 80.5 fL (ref 80.0–100.0)
MPV: 10.9 fL (ref 7.5–12.5)
PLATELETS: 318 10*3/uL (ref 140–400)
RBC: 4.82 10*6/uL (ref 3.80–5.10)
RDW: 12.4 % (ref 11.0–15.0)
WBC: 5.9 10*3/uL (ref 3.8–10.8)

## 2017-06-24 LAB — LIPID PANEL
Cholesterol: 158 mg/dL (ref <200)
HDL: 49 mg/dL — ABNORMAL LOW (ref >50)
LDL Cholesterol (Calc): 94 mg/dL (calc)
NON-HDL CHOLESTEROL (CALC): 109 mg/dL (ref <130)
Total CHOL/HDL Ratio: 3.2 (calc) (ref <5.0)
Triglycerides: 66 mg/dL (ref <150)

## 2017-06-24 LAB — COMPLETE METABOLIC PANEL WITH GFR
AG RATIO: 1.4 (calc) (ref 1.0–2.5)
ALBUMIN MSPROF: 4.1 g/dL (ref 3.6–5.1)
ALT: 15 U/L (ref 6–29)
AST: 15 U/L (ref 10–30)
Alkaline phosphatase (APISO): 119 U/L — ABNORMAL HIGH (ref 33–115)
BUN: 8 mg/dL (ref 7–25)
CALCIUM: 9.4 mg/dL (ref 8.6–10.2)
CO2: 29 mmol/L (ref 20–32)
Chloride: 105 mmol/L (ref 98–110)
Creat: 0.71 mg/dL (ref 0.50–1.10)
GFR, EST AFRICAN AMERICAN: 125 mL/min/{1.73_m2} (ref >=60)
GFR, EST NON AFRICAN AMERICAN: 108 mL/min/{1.73_m2} (ref >=60)
GLOBULIN: 2.9 g/dL (ref 1.9–3.7)
Glucose, Bld: 95 mg/dL (ref 65–139)
POTASSIUM: 4.9 mmol/L (ref 3.5–5.3)
SODIUM: 139 mmol/L (ref 135–146)
TOTAL PROTEIN: 7 g/dL (ref 6.1–8.1)
Total Bilirubin: 0.8 mg/dL (ref 0.2–1.2)

## 2017-06-24 LAB — VITAMIN B12: Vitamin B-12: 489 pg/mL (ref 200–1100)

## 2017-06-24 LAB — HEMOGLOBIN A1C
Hgb A1c MFr Bld: 5.2 % of total Hgb (ref <5.7)
Mean Plasma Glucose: 103 (calc)
eAG (mmol/L): 5.7 (calc)

## 2017-06-24 LAB — TSH: TSH: 2.37 mIU/L

## 2017-06-24 LAB — VITAMIN D 25 HYDROXY (VIT D DEFICIENCY, FRACTURES): Vit D, 25-Hydroxy: 23 ng/mL — ABNORMAL LOW (ref 30–100)

## 2017-07-05 ENCOUNTER — Other Ambulatory Visit: Payer: Self-pay

## 2017-07-05 ENCOUNTER — Telehealth: Payer: Self-pay | Admitting: Family Medicine

## 2017-07-05 MED ORDER — RIZATRIPTAN BENZOATE 10 MG PO TABS: 10.0000 mg | ORAL_TABLET | ORAL | 0 refills | Status: DC | PRN

## 2017-07-05 NOTE — Telephone Encounter (Signed)
Patient is requesting rx for vitamin D and migraines  Cb#: [phone]  Pharmacy: [facility].

## 2017-07-05 NOTE — Telephone Encounter (Signed)
Called and spoke to pt, advised vit d is otc. She is unsure of what med she takes for her migraines, is going to have pharmacy fax [place] her med list.

## 2017-08-04 ENCOUNTER — Other Ambulatory Visit: Payer: Self-pay | Admitting: Family Medicine

## 2017-08-07 NOTE — Telephone Encounter (Signed)
Seen 06/23/17

## 2017-08-29 ENCOUNTER — Other Ambulatory Visit: Payer: Self-pay | Admitting: Family Medicine

## 2017-08-30 NOTE — Telephone Encounter (Signed)
Seen 06/23/17

## 2017-09-20 DIAGNOSIS — M7021 Olecranon bursitis, right elbow: Secondary | ICD-10-CM | POA: Diagnosis not present

## 2017-10-04 ENCOUNTER — Other Ambulatory Visit: Payer: Self-pay | Admitting: Family Medicine

## 2017-10-04 NOTE — Telephone Encounter (Signed)
Seen 06/23/17

## 2017-10-06 ENCOUNTER — Ambulatory Visit: Payer: BLUE CROSS/BLUE SHIELD | Admitting: Family Medicine

## 2017-10-11 ENCOUNTER — Other Ambulatory Visit: Payer: Self-pay

## 2017-10-11 ENCOUNTER — Ambulatory Visit (INDEPENDENT_AMBULATORY_CARE_PROVIDER_SITE_OTHER): Payer: BLUE CROSS/BLUE SHIELD | Admitting: Family Medicine

## 2017-10-11 ENCOUNTER — Encounter: Payer: Self-pay | Admitting: Family Medicine

## 2017-10-11 VITALS — BP 140/88 | HR 84 | Temp 98.4°F | Resp 18 | Ht 64.0 in | Wt 288.1 lb

## 2017-10-11 DIAGNOSIS — M7711 Lateral epicondylitis, right elbow: Secondary | ICD-10-CM | POA: Diagnosis not present

## 2017-10-11 MED ORDER — RIZATRIPTAN BENZOATE 10 MG PO TABS: ORAL_TABLET | ORAL | 2 refills | Status: DC

## 2017-10-11 MED ORDER — MELOXICAM 15 MG PO TABS: 15.0000 mg | ORAL_TABLET | Freq: Every day | ORAL | 3 refills | Status: DC

## 2017-10-11 NOTE — Patient Instructions (Addendum)
Ice for 20 min 2 times a day Take mobic daily with food Stretch and exercise arm Tennis elbow strap ( get at [facility])   Tennis Elbow Rehab Ask your health care provider which exercises are safe for you. Do exercises exactly as told by your health care provider and adjust them as directed. It is normal to feel mild stretching, pulling, tightness, or discomfort as you do these exercises, but you should stop right away if you feel sudden pain or your pain gets worse. Do not begin these exercises until told by your health care provider. Stretching and range of motion exercises These exercises warm up your muscles and joints and improve the movement and flexibility of your elbow. These exercises also help to relieve pain, numbness, and tingling. Exercise A: Wrist extensor stretch 1. Extend your left / right elbow with your fingers pointing down. 2. Gently pull the palm of your left / right hand toward you until you feel a gentle stretch on the top of your forearm. 3. To increase the stretch, push your left / right hand toward the outer edge or pinkie side of your forearm. 4. Hold this position for ____25______ seconds. Repeat _____10_____ times. Complete this exercise _____2_____ times a day. If directed by your health care provider, repeat this stretch except do it with a bent elbow this time. Exercise B: Wrist flexor stretch  1. Extend your left / right elbow and turn your palm upward. 2. Gently pull your left / right palm and fingertips back so your wrist extends and your fingers point more toward the ground. 3. You should feel a gentle stretch on the inside of your forearm. 4. Hold this position for ____15______ seconds. Repeat ___10_______ times. Complete this exercise ____2______ times a day. If directed by your health care provider, repeat this stretch except do it with a bent elbow this time. Strengthening exercises These exercises build strength and endurance in your elbow.  Endurance is the ability to use your muscles for a long time, even after they get tired. Exercise C: Wrist extensors  1. Sit with your left / right forearm palm-down and fully supported on a table or countertop. Your elbow should be resting below the height of your shoulder. 2. Let your left / right wrist extend over the edge of the surface. 3. Loosely hold a ___light_______ weight or a piece of rubber exercise band or tubing in your left / right hand. Slowly curl your left / right hand up toward your forearm. If you are using band or tubing, hold the band or tubing in place with your other hand to provide resistance. 4. Hold this position for __10________ seconds. 5. Slowly return to the starting position. Repeat ____10______ times. Complete this exercise _____2_____ times a day.  Exercise D: Eccentric wrist extensors 1. Sit with your left / right forearm palm-down and fully supported on a table or countertop. Your elbow should be resting below the height of your shoulder. 2. If told by your health care provider, hold a ____1 lb______ weight in your hand. 3. Let your left / right wrist extend over the edge of the surface. 4. Use your other hand to lift up your left / right hand toward your forearm. Keep your forearm on the table. 5. Using only the muscles in your left / right hand, slowly lower your hand back down to the starting position. Repeat ___10_______ times. Complete this exercise ______2____ times a day. This information is not intended to replace advice  given to you by your health care provider. Make sure you discuss any questions you have with your health care provider.

## 2017-10-11 NOTE — Progress Notes (Signed)
Chief Complaint  Patient presents with  . Follow-up    er right arm pain   Kristin [name] is here for 2 problems.  First, she has had elbow pain for a few weeks.  She went to an urgent care clinic and was told she had "bursitis".  She was given prednisone.  Her elbow continues painful.  She had no accident, she had no injury.  No fall.  No trauma.  She does not recall any repetitive activity with the right hand.  She is right-handed.  She has never had a similar pain before.  She also complains of intermittent left ankle pain, worse with ambulation. The second issue she wants to discuss is her obesity.  Her BMI today is just over 49.  She has been unsuccessful losing weight on her own.  She would like a referral to a medical bariatric specialist.  She does not wish to discuss bariatric surgery.  Patient Active Problem List   Diagnosis Date Noted  . IUD (intrauterine device) in place 01/13/2016  . Migraines 03/07/2013    Outpatient Encounter Medications as of 10/11/2017  Medication Sig  . meloxicam (MOBIC) 15 MG tablet Take 1 tablet (15 mg total) by mouth daily.  . rizatriptan (MAXALT) 10 MG tablet TAKE 1 TABLET BY MOUTH ONCE AT ONSET OF SYMPTOMS, MAY REPEAT DOSE IN 2 HOURS IF NEEDED.   No facility-administered encounter medications on file as of 10/11/2017.     No Known Allergies  Review of Systems  Constitutional: Negative for activity change, appetite change and unexpected weight change.  HENT: Negative for congestion, dental problem, postnasal drip and rhinorrhea.   Eyes: Negative for redness and visual disturbance.  Respiratory: Negative for cough and shortness of breath.   Cardiovascular: Negative for chest pain, palpitations and leg swelling.  Gastrointestinal: Negative for abdominal pain, constipation and diarrhea.  Genitourinary: Negative for difficulty urinating and frequency.  Musculoskeletal: Positive for arthralgias. Negative for back pain.       Right elbow pain.  Left ankle  pain  Neurological: Negative for dizziness and headaches.  Psychiatric/Behavioral: Negative for dysphoric mood and sleep disturbance. The patient is not nervous/anxious.     BP 140/88 (BP Location: Left Arm, Patient Position: Sitting, Cuff Size: Large)   Pulse 84   Temp 98.4 F (36.9 C) (Temporal)   Resp 18   Ht 5\' 4"  (1.626 m)   Wt 288 lb 1.3 oz (130.7 kg)   SpO2 99%   BMI 49.45 kg/m   Physical Exam  Constitutional: She is oriented to person, place, and time. No distress.  Pleasant.  Super obese.  Upset about weight.  HENT:  Head: Normocephalic and atraumatic.  Mouth/Throat: Oropharynx is clear and moist.  Eyes: Conjunctivae are normal. Pupils are equal, round, and reactive to light.  Neck: Normal range of motion. Neck supple. No thyromegaly present.  Cardiovascular: Normal rate, regular rhythm and normal heart sounds.  Pulmonary/Chest: Effort normal and breath sounds normal. She has no rales.  Abdominal: Soft. Bowel sounds are normal. There is no tenderness.  Musculoskeletal: Normal range of motion. She exhibits no edema.  Neck has full range of motion.  Both extremities examined.  Right elbow has point tenderness over the lateral epicondyle and extensor tendon insertion.  Increased pain with resistance to wrist extension.  Lower extremities examined.  Both ankles have good range of motion with no instability, I do not discern any tenderness.  No effusion.  No crepitus.  Neurological: She is alert and  oriented to person, place, and time.  Psychiatric: She has a normal mood and affect. Her behavior is normal. Thought content normal.    ASSESSMENT/PLAN:  1. Lateral epicondylitis of right elbow Discussed repetitive motion injuries, lateral epicondylitis.  Similar to tennis elbow.  We discussed an elbow strap, wrist brace.  She needs to use ice at least 20 minutes twice a day.  And giving her Mobic to take once a day for 2 weeks.  Take with food.  If this does not work she may need  an injection.  She is given a tennis elbow rehab exercise sheet  2. Morbid obesity (HCC) Dr. Quillian Quince specializes and obesity.  A referral to her is placed. - Ambulatory referral to [facility]   Patient Instructions  Ice for 20 min 2 times a day Take mobic daily with food Stretch and exercise arm Tennis elbow strap ( get at [facility])   Tennis Elbow Rehab Ask your health care provider which exercises are safe for you. Do exercises exactly as told by your health care provider and adjust them as directed. It is normal to feel mild stretching, pulling, tightness, or discomfort as you do these exercises, but you should stop right away if you feel sudden pain or your pain gets worse. Do not begin these exercises until told by your health care provider. Stretching and range of motion exercises These exercises warm up your muscles and joints and improve the movement and flexibility of your elbow. These exercises also help to relieve pain, numbness, and tingling. Exercise A: Wrist extensor stretch 1. Extend your left / right elbow with your fingers pointing down. 2. Gently pull the palm of your left / right hand toward you until you feel a gentle stretch on the top of your forearm. 3. To increase the stretch, push your left / right hand toward the outer edge or pinkie side of your forearm. 4. Hold this position for ____25______ seconds. Repeat _____10_____ times. Complete this exercise _____2_____ times a day. If directed by your health care provider, repeat this stretch except do it with a bent elbow this time. Exercise B: Wrist flexor stretch  1. Extend your left / right elbow and turn your palm upward. 2. Gently pull your left / right palm and fingertips back so your wrist extends and your fingers point more toward the ground. 3. You should feel a gentle stretch on the inside of your forearm. 4. Hold this position for ____15______ seconds. Repeat ___10_______ times.  Complete this exercise ____2______ times a day. If directed by your health care provider, repeat this stretch except do it with a bent elbow this time. Strengthening exercises These exercises build strength and endurance in your elbow. Endurance is the ability to use your muscles for a long time, even after they get tired. Exercise C: Wrist extensors  1. Sit with your left / right forearm palm-down and fully supported on a table or countertop. Your elbow should be resting below the height of your shoulder. 2. Let your left / right wrist extend over the edge of the surface. 3. Loosely hold a ___light_______ weight or a piece of rubber exercise band or tubing in your left / right hand. Slowly curl your left / right hand up toward your forearm. If you are using band or tubing, hold the band or tubing in place with your other hand to provide resistance. 4. Hold this position for __10________ seconds. 5. Slowly return to the starting position. Repeat ____10______  times. Complete this exercise _____2_____ times a day.  Exercise D: Eccentric wrist extensors 1. Sit with your left / right forearm palm-down and fully supported on a table or countertop. Your elbow should be resting below the height of your shoulder. 2. If told by your health care provider, hold a ____1 lb______ weight in your hand. 3. Let your left / right wrist extend over the edge of the surface. 4. Use your other hand to lift up your left / right hand toward your forearm. Keep your forearm on the table. 5. Using only the muscles in your left / right hand, slowly lower your hand back down to the starting position. Repeat ___10_______ times. Complete this exercise ______2____ times a day. This information is not intended to replace advice given to you by your health care provider. Make sure you discuss any questions you have with your health care provider.     Eustace MooreYvonne Sue Kaynan Klonowski, MD

## 2017-11-01 ENCOUNTER — Encounter: Payer: Self-pay | Admitting: Advanced Practice Midwife

## 2017-11-01 ENCOUNTER — Ambulatory Visit (INDEPENDENT_AMBULATORY_CARE_PROVIDER_SITE_OTHER): Payer: BLUE CROSS/BLUE SHIELD | Admitting: Advanced Practice Midwife

## 2017-11-01 VITALS — BP 124/76 | HR 80 | Ht 64.0 in | Wt 288.0 lb

## 2017-11-01 DIAGNOSIS — R1032 Left lower quadrant pain: Secondary | ICD-10-CM | POA: Diagnosis not present

## 2017-11-01 NOTE — Progress Notes (Signed)
[facility]  Patient name: Kristin IvanMiranda Labrada MRN [id number]  Date of birth: 09/30/1981  CC & HPI:  Kristin [name] is a 37 y.o. African American female presenting today for c/o cramping, even when not bleeding. Has had a Mirena IUD for 2 years.  Started noticing more cramping in her LLQ for the past 2 months. Doesn't feel like menstrual cramps, is very random.  Not sexually active now. Amenorrhic, but sometimes feels "PMS-y". .    Pertinent History Reviewed:  Medical & Surgical Hx:   Past Medical History:  Diagnosis Date  . Allergy   . Contraceptive management 11/05/2013  . Depression   . GERD (gastroesophageal reflux disease)   . IUD (intrauterine device) in place 01/13/2016  . Migraines   . Missed periods 01/13/2016  . Vaginal Pap smear, abnormal    Past Surgical History:  Procedure Laterality Date  . NO PAST SURGERIES     Family History  Problem Relation Age of Onset  . Other Mother        enlarged heart  . Diabetes Mother   . Hypertension Mother   . Hyperlipidemia Mother   . Heart disease Mother        heart murmer  . Miscarriages / [place] Mother   . Hypertension Father   . Hyperlipidemia Father   . Cancer Father        prostate  . Other Brother        enlarged heart; colloid cyst of the third ventricle( of the brain)  . Cancer Paternal Grandmother        leukemia  . Cancer Paternal Grandfather        lung  . Hypertension Sister     Current Outpatient Medications:  .  rizatriptan (MAXALT) 10 MG tablet, TAKE 1 TABLET BY MOUTH ONCE AT ONSET OF SYMPTOMS, MAY REPEAT DOSE IN 2 HOURS IF NEEDED., Disp: 10 tablet, Rfl: 2 .  meloxicam (MOBIC) 15 MG tablet, Take 1 tablet (15 mg total) by mouth daily. (Patient not taking: Reported on 11/01/2017), Disp: 30 tablet, Rfl: 3 Social History: Reviewed -  reports that  has never smoked. she has never used smokeless tobacco.  Review of Systems:   Constitutional: Negative for fever and chills Eyes: Negative for  visual disturbances Respiratory: Negative for shortness of breath, dyspnea Cardiovascular: Negative for chest pain or palpitations  Gastrointestinal: Negative for vomiting, diarrhea and constipation; no abdominal pain Genitourinary: Negative for dysuria and urgency, vaginal irritation or itching Musculoskeletal: Negative for back pain, joint pain, myalgias  Neurological: Negative for dizziness and headaches    Objective Findings:    Physical Examination: General appearance - well appearing, and in no distress Mental status - alert, oriented to person, place, and time Chest:  Normal respiratory effort Heart - normal rate and regular rhythm Abdomen:  Soft, nontender Pelvic: SSE:  Normal appearing cx mucus/discharge.  Strings visible. NuSwab collected.  Non tender to bimanual, even on Left side.  Musculoskeletal:  Normal range of motion without pain Extremities:  No edema    No results found for this or any previous visit (from the past 24 hour(s)).    Assessment & Plan:  A:   LLQ pain of unknown source P: R/O infection w/nuswab  Pelvic US to check IUD/?cyst.  Consider intestinal source (diverticulosis?)  As it stands now, pt wants to keep IUD unless pain gets much worse.    Return for pelvic US/visit w/Fran after.  CRESENZO-DISHMAN,Koryn Charlot CNM 11/01/2017 2:03 PM

## 2017-11-08 ENCOUNTER — Ambulatory Visit (INDEPENDENT_AMBULATORY_CARE_PROVIDER_SITE_OTHER): Payer: BLUE CROSS/BLUE SHIELD

## 2017-11-08 ENCOUNTER — Encounter: Payer: Self-pay | Admitting: Advanced Practice Midwife

## 2017-11-08 ENCOUNTER — Ambulatory Visit (INDEPENDENT_AMBULATORY_CARE_PROVIDER_SITE_OTHER): Payer: BLUE CROSS/BLUE SHIELD | Admitting: Advanced Practice Midwife

## 2017-11-08 ENCOUNTER — Other Ambulatory Visit: Payer: Self-pay

## 2017-11-08 VITALS — BP 132/76 | HR 83 | Ht 64.0 in | Wt 289.0 lb

## 2017-11-08 DIAGNOSIS — R1032 Left lower quadrant pain: Secondary | ICD-10-CM

## 2017-11-08 LAB — NUSWAB VAGINITIS PLUS (VG+)
CANDIDA ALBICANS, NAA: NEGATIVE
Candida glabrata, NAA: NEGATIVE
Chlamydia trachomatis, NAA: NEGATIVE
Neisseria gonorrhoeae, NAA: NEGATIVE
TRICH VAG BY NAA: NEGATIVE

## 2017-11-08 NOTE — Progress Notes (Signed)
PELVIC US TA/TV:homogeneous anteverted uterus,wnl,normal ovaries bilat,IUD is centrally located w/in the endometrium, EEC 8.9 mm,no free fluid,no pain during ultrasound,ovaries appear mobile

## 2017-11-08 NOTE — Progress Notes (Signed)
[facility]  Patient name: Kristin [name] Brierley MRN [id number]  Date of birth: 07/29/1981  CC & HPI:  Kristin [name] Rasnick is a 37 y.o. African American female presenting today for US. Seen last week for cramps even w/o bleeding  Has Mirena IUD, wants to keep it unless pain gets worse  NuSwab all negative Today had pelvic US  PELVIC US TA/TV:homogeneous anteverted uterus,wnl,normal ovaries bilat,IUD is centrally located w/in the endometrium, EEC 8.9 mm,no free fluid,no pain during ultrasound,ovaries appear mobile    Pertinent History Reviewed:  Medical & Surgical Hx:   Past Medical History:  Diagnosis Date  . Allergy   . Contraceptive management 11/05/2013  . Depression   . GERD (gastroesophageal reflux disease)   . IUD (intrauterine device) in place 01/13/2016  . Migraines   . Missed periods 01/13/2016  . Vaginal Pap smear, abnormal    Past Surgical History:  Procedure Laterality Date  . NO PAST SURGERIES     Family History  Problem Relation Age of Onset  . Other Mother        enlarged heart  . Diabetes Mother   . Hypertension Mother   . Hyperlipidemia Mother   . Heart disease Mother        heart murmer  . Miscarriages / [place] Mother   . Hypertension Father   . Hyperlipidemia Father   . Cancer Father        prostate  . Other Brother        enlarged heart; colloid cyst of the third ventricle( of the brain)  . Cancer Paternal Grandmother        leukemia  . Cancer Paternal Grandfather        lung  . Hypertension Sister     Current Outpatient Medications:  .  rizatriptan (MAXALT) 10 MG tablet, TAKE 1 TABLET BY MOUTH ONCE AT ONSET OF SYMPTOMS, MAY REPEAT DOSE IN 2 HOURS IF NEEDED., Disp: 10 tablet, Rfl: 2 .  meloxicam (MOBIC) 15 MG tablet, Take 1 tablet (15 mg total) by mouth daily. (Patient not taking: Reported on 11/01/2017), Disp: 30 tablet, Rfl: 3 Social History: Reviewed -  reports that  has never smoked. she has never used smokeless  tobacco.  Review of Systems:   Constitutional: Negative for fever and chills Eyes: Negative for visual disturbances Respiratory: Negative for shortness of breath, dyspnea Cardiovascular: Negative for chest pain or palpitations  Gastrointestinal: Negative for vomiting, diarrhea and constipation; Genitourinary: Negative for dysuria and urgency, vaginal irritation or itching Musculoskeletal: Negative for back pain, joint pain, myalgias  Neurological: Negative for dizziness and headaches    Objective Findings:    Physical Examination: General appearance - well appearing, and in no distress Mental status - alert, oriented to person, place, and time Chest:  Normal respiratory effort Heart - normal rate and regular rhythm Abdomen:  Soft, nontender Pelvic: deferred Musculoskeletal:  Normal range of motion without pain Extremities:  No edema    No results found for this or any previous visit (from the past 24 hour(s)).    Assessment & Plan:  A:   LLQ cramps, ? intestinal P:  Diverticulitis precautions given if pain gets worse/fever    Return for If you have any problems.  CRESENZO-DISHMAN,Staley Lunz CNM 11/08/2017 2:00 PM

## 2018-01-15 ENCOUNTER — Other Ambulatory Visit: Payer: Self-pay | Admitting: Family Medicine

## 2018-01-23 ENCOUNTER — Ambulatory Visit: Payer: BLUE CROSS/BLUE SHIELD | Admitting: Family Medicine

## 2018-01-25 ENCOUNTER — Encounter (INDEPENDENT_AMBULATORY_CARE_PROVIDER_SITE_OTHER): Payer: BLUE CROSS/BLUE SHIELD

## 2018-02-05 ENCOUNTER — Ambulatory Visit (INDEPENDENT_AMBULATORY_CARE_PROVIDER_SITE_OTHER): Payer: BLUE CROSS/BLUE SHIELD | Admitting: Family Medicine

## 2018-03-09 ENCOUNTER — Encounter: Payer: Self-pay | Admitting: Family Medicine

## 2018-03-12 ENCOUNTER — Other Ambulatory Visit: Payer: Self-pay | Admitting: Family Medicine

## 2018-03-21 DIAGNOSIS — M25572 Pain in left ankle and joints of left foot: Secondary | ICD-10-CM | POA: Diagnosis not present

## 2018-03-21 DIAGNOSIS — R202 Paresthesia of skin: Secondary | ICD-10-CM | POA: Diagnosis not present

## 2018-03-21 DIAGNOSIS — G629 Polyneuropathy, unspecified: Secondary | ICD-10-CM | POA: Diagnosis not present

## 2018-03-21 DIAGNOSIS — Z79899 Other long term (current) drug therapy: Secondary | ICD-10-CM | POA: Diagnosis not present

## 2018-03-21 DIAGNOSIS — Z6841 Body Mass Index (BMI) 40.0 and over, adult: Secondary | ICD-10-CM | POA: Diagnosis not present

## 2018-04-11 ENCOUNTER — Ambulatory Visit: Payer: BLUE CROSS/BLUE SHIELD | Admitting: Orthopedic Surgery

## 2018-04-24 ENCOUNTER — Ambulatory Visit: Payer: BLUE CROSS/BLUE SHIELD | Admitting: Orthopedic Surgery

## 2018-04-24 ENCOUNTER — Telehealth: Payer: Self-pay | Admitting: Radiology

## 2018-04-24 ENCOUNTER — Encounter: Payer: Self-pay | Admitting: Orthopedic Surgery

## 2018-04-24 ENCOUNTER — Ambulatory Visit (INDEPENDENT_AMBULATORY_CARE_PROVIDER_SITE_OTHER): Payer: BLUE CROSS/BLUE SHIELD

## 2018-04-24 VITALS — BP 140/90 | HR 85 | Ht 64.0 in | Wt 294.0 lb

## 2018-04-24 DIAGNOSIS — M79672 Pain in left foot: Secondary | ICD-10-CM | POA: Diagnosis not present

## 2018-04-24 DIAGNOSIS — M76822 Posterior tibial tendinitis, left leg: Secondary | ICD-10-CM

## 2018-04-24 DIAGNOSIS — Z6841 Body Mass Index (BMI) 40.0 and over, adult: Secondary | ICD-10-CM | POA: Diagnosis not present

## 2018-04-24 DIAGNOSIS — M25572 Pain in left ankle and joints of left foot: Secondary | ICD-10-CM | POA: Diagnosis not present

## 2018-04-24 DIAGNOSIS — G8929 Other chronic pain: Secondary | ICD-10-CM

## 2018-04-24 NOTE — Patient Instructions (Signed)
Call [employer] for appointment for the brace for your foot/ ankle pain their number is  [phone]

## 2018-04-24 NOTE — Progress Notes (Addendum)
NEW PATIENT OFFICE VISI  Chief Complaint  Patient presents with  . Ankle Pain    left pain swelling   . Foot Pain    left    37 year old female presents for evaluation of lateral foot pain  She complains of pain in the lateral gutter and lateral aspect of the subtalar joint of her left foot.  She has a history of a previous ankle injury many years ago which was treated and successfully returned to normal unction  She complains of recent onset of pain just below the fibula left foot dull moderate associated with painful gait   Review of Systems  Constitutional: Negative for fever.  Respiratory: Negative for shortness of breath.   Cardiovascular: Negative for chest pain.  Skin: Negative.   Neurological: Negative for tingling and sensory change.     Past Medical History:  Diagnosis Date  . Allergy   . Contraceptive management 11/05/2013  . Depression   . GERD (gastroesophageal reflux disease)   . IUD (intrauterine device) in place 01/13/2016  . Migraines   . Missed periods 01/13/2016  . Vaginal Pap smear, abnormal     Past Surgical History:  Procedure Laterality Date  . NO PAST SURGERIES      Family History  Problem Relation Age of Onset  . Other Mother        enlarged heart  . Diabetes Mother   . Hypertension Mother   . Hyperlipidemia Mother   . Heart disease Mother        heart murmer  . Miscarriages / [place] Mother   . Hypertension Father   . Hyperlipidemia Father   . Cancer Father        prostate  . Other Brother        enlarged heart; colloid cyst of the third ventricle( of the brain)  . Cancer Paternal Grandmother        leukemia  . Cancer Paternal Grandfather        lung  . Hypertension Sister    Social History   Tobacco Use  . Smoking status: Never Smoker  . Smokeless tobacco: Never Used  Substance Use Topics  . Alcohol use: No  . Drug use: No    No Known Allergies  Current Meds  Medication Sig  . cholecalciferol (VITAMIN D) 1000  units tablet Take 1,000 Units by mouth daily.  . meloxicam (MOBIC) 15 MG tablet Take 1 tablet (15 mg total) by mouth daily.  . rizatriptan (MAXALT) 10 MG tablet TAKE 1 TABLET BY MOUTH ONCE AT ONSET OF SYMPTOMS, MAY REPEAT DOSE IN 2 HOURS IF NEEDED.    BP 140/90   Pulse 85   Ht 5\' 4"  (1.626 m)   Wt 294 lb (133.4 kg)   BMI 50.46 kg/m   Physical Exam  Constitutional: She is oriented to person, place, and time. She appears well-developed and well-nourished.  Neurological: She is alert and oriented to person, place, and time.  Psychiatric: She has a normal mood and affect. Judgment normal.  Vitals reviewed.   Ortho Exam  Left foot bilateral pes planus no real tenderness on the medial side but poor Function and tiptoe testing pain and tenderness are located laterally nutcracker syndrome  Tight heel cord.  Ankle stable.  Skin normal.  Good pulse and perfusion normal color.  Right foot pes planus without tenderness normal range of motion no instability neurovascular exam intact skin normal  Gait undisturbed.  MEDICAL DECISION SECTION  [facility]  My independent reading of xrays:  Severe pes planus left foot otherwise normal, normal x-ray left ankle.  Encounter Diagnoses  Name Primary?  . Posterior tibial tendon dysfunction (PTTD) of left lower extremity Yes  . Chronic pain of left ankle   . Body mass index 50.0-59.9, adult (HCC)   . Morbid obesity (HCC)     PLAN: (Rx., injectx, surgery, frx, mri/ct) Medial arch support at [facility]  Physical therapy  We discussed her dorsal bunion she does not have a classic bunion deformity with normal alignment of the toe clinically and on x-ray (10 degree metatarsal phalangeal angle)  The patient meets the AMA guidelines for Morbid (severe) obesity with a BMI > 40.0 and I have recommended weight loss.  We also discuss various options for her in terms of her diet.  She will go on an all water diet she can  use lemon and lime flavoring there are also carbonated beverages with flavoring because she says sodas are her weakness  She is also been to the weight loss bariatric clinic but it so expensive she is wary about doing that without a prescription to get it covered by insurance  She is also advised to do stationary biking light hand weights for conditioning and strengthening    No orders of the defined types were placed in this encounter.   Fuller Canada, MD  04/24/2018 11:26 AM

## 2018-04-24 NOTE — Telephone Encounter (Signed)
Patient left her order for brace here, she has the number to call [employer]. I have faxed the order to them, so they will have it when she calls  [facility] bilateral

## 2018-04-24 NOTE — Addendum Note (Signed)
Addended byCaffie Damme: LITTRELL, AMY W on: 04/24/2018 04:13 PM   Modules accepted: Orders

## 2018-06-06 ENCOUNTER — Ambulatory Visit: Payer: BLUE CROSS/BLUE SHIELD | Admitting: Orthopedic Surgery

## 2018-06-18 ENCOUNTER — Ambulatory Visit: Payer: BLUE CROSS/BLUE SHIELD | Admitting: Orthopedic Surgery

## 2018-07-02 ENCOUNTER — Ambulatory Visit: Payer: BLUE CROSS/BLUE SHIELD | Admitting: Orthopedic Surgery

## 2018-07-03 ENCOUNTER — Encounter: Payer: Self-pay | Admitting: Orthopedic Surgery

## 2018-07-16 DIAGNOSIS — R928 Other abnormal and inconclusive findings on diagnostic imaging of breast: Secondary | ICD-10-CM | POA: Diagnosis not present

## 2018-07-16 DIAGNOSIS — L700 Acne vulgaris: Secondary | ICD-10-CM | POA: Diagnosis not present

## 2018-07-16 DIAGNOSIS — Z6841 Body Mass Index (BMI) 40.0 and over, adult: Secondary | ICD-10-CM | POA: Diagnosis not present

## 2018-07-16 DIAGNOSIS — G43009 Migraine without aura, not intractable, without status migrainosus: Secondary | ICD-10-CM | POA: Diagnosis not present

## 2018-07-27 DIAGNOSIS — R928 Other abnormal and inconclusive findings on diagnostic imaging of breast: Secondary | ICD-10-CM | POA: Diagnosis not present

## 2018-08-01 DIAGNOSIS — Z6841 Body Mass Index (BMI) 40.0 and over, adult: Secondary | ICD-10-CM | POA: Diagnosis not present

## 2018-08-01 DIAGNOSIS — L700 Acne vulgaris: Secondary | ICD-10-CM | POA: Diagnosis not present

## 2018-08-17 ENCOUNTER — Other Ambulatory Visit: Payer: BLUE CROSS/BLUE SHIELD | Admitting: Women's Health

## 2018-09-13 ENCOUNTER — Other Ambulatory Visit: Payer: BLUE CROSS/BLUE SHIELD | Admitting: Adult Health

## 2018-09-15 ENCOUNTER — Other Ambulatory Visit: Payer: Self-pay

## 2018-09-15 ENCOUNTER — Emergency Department (HOSPITAL_COMMUNITY)
Admission: EM | Admit: 2018-09-15 | Discharge: 2018-09-15 | Disposition: A | Discharge status: Home / Self Care | Payer: BLUE CROSS/BLUE SHIELD | Attending: Emergency Medicine | Admitting: Emergency Medicine

## 2018-09-15 ENCOUNTER — Emergency Department (HOSPITAL_COMMUNITY): Payer: BLUE CROSS/BLUE SHIELD

## 2018-09-15 ENCOUNTER — Encounter (HOSPITAL_COMMUNITY): Payer: Self-pay | Admitting: Emergency Medicine

## 2018-09-15 DIAGNOSIS — Y999 Unspecified external cause status: Secondary | ICD-10-CM | POA: Diagnosis not present

## 2018-09-15 DIAGNOSIS — Z79899 Other long term (current) drug therapy: Secondary | ICD-10-CM | POA: Diagnosis not present

## 2018-09-15 DIAGNOSIS — Y929 Unspecified place or not applicable: Secondary | ICD-10-CM | POA: Diagnosis not present

## 2018-09-15 DIAGNOSIS — Y939 Activity, unspecified: Secondary | ICD-10-CM | POA: Diagnosis not present

## 2018-09-15 DIAGNOSIS — X58XXXA Exposure to other specified factors, initial encounter: Secondary | ICD-10-CM | POA: Insufficient documentation

## 2018-09-15 DIAGNOSIS — S299XXA Unspecified injury of thorax, initial encounter: Secondary | ICD-10-CM | POA: Diagnosis not present

## 2018-09-15 DIAGNOSIS — S29011A Strain of muscle and tendon of front wall of thorax, initial encounter: Secondary | ICD-10-CM | POA: Insufficient documentation

## 2018-09-15 DIAGNOSIS — T148XXA Other injury of unspecified body region, initial encounter: Secondary | ICD-10-CM

## 2018-09-15 DIAGNOSIS — R079 Chest pain, unspecified: Secondary | ICD-10-CM | POA: Diagnosis present

## 2018-09-15 DIAGNOSIS — R0789 Other chest pain: Secondary | ICD-10-CM

## 2018-09-15 LAB — CBC
HCT: 40.6 % (ref 36.0–46.0)
Hemoglobin: 12.5 g/dL (ref 12.0–15.0)
MCH: 25.7 pg — ABNORMAL LOW (ref 26.0–34.0)
MCHC: 30.8 g/dL (ref 30.0–36.0)
MCV: 83.5 fL (ref 80.0–100.0)
Platelets: 314 10*3/uL (ref 150–400)
RBC: 4.86 MIL/uL (ref 3.87–5.11)
RDW: 13.2 % (ref 11.5–15.5)
WBC: 7.7 10*3/uL (ref 4.0–10.5)
nRBC: 0 % (ref 0.0–0.2)

## 2018-09-15 LAB — BASIC METABOLIC PANEL
Anion gap: 8 (ref 5–15)
BUN: 9 mg/dL (ref 6–20)
CO2: 25 mmol/L (ref 22–32)
Calcium: 8.6 mg/dL — ABNORMAL LOW (ref 8.9–10.3)
Chloride: 104 mmol/L (ref 98–111)
Creatinine, Ser: 0.67 mg/dL (ref 0.44–1.00)
GFR calc Af Amer: >60 mL/min (ref >60)
GFR calc non Af Amer: >60 mL/min (ref >60)
Glucose, Bld: 95 mg/dL (ref 70–99)
Potassium: 3.2 mmol/L — ABNORMAL LOW (ref 3.5–5.1)
Sodium: 137 mmol/L (ref 135–145)

## 2018-09-15 LAB — I-STAT BETA HCG BLOOD, ED (MC, WL, AP ONLY): I-stat hCG, quantitative: <5 m[IU]/mL (ref <5)

## 2018-09-15 LAB — TROPONIN I: Troponin I: <0.03 ng/mL (ref <0.03)

## 2018-09-15 LAB — D-DIMER, QUANTITATIVE: D-Dimer, Quant: 0.35 ug/mL-FEU (ref 0.00–0.50)

## 2018-09-15 LAB — I-STAT CG4 LACTIC ACID, ED: Lactic Acid, Venous: 2.04 mmol/L (ref 0.5–1.9)

## 2018-09-15 LAB — LIPASE, BLOOD: LIPASE: 25 U/L (ref 11–51)

## 2018-09-15 NOTE — ED Provider Notes (Signed)
[facility] EMERGENCY DEPARTMENT Provider Note   CSN: [id number] Arrival date & time: 09/15/18  1802     History   Chief Complaint Chief Complaint  Patient presents with  . Chest Pain    HPI Kristin [name] is a 37 y.o. female.  Patient is a 37 year old female who presents to the emergency department with a complaint of chest pain.  The patient states this problem started on yesterday December 13 approximately 9 AM.  The patient describes the pain as a dull pain in the chest that radiates to the left shoulder and into the left neck.  It will last approximately 1 hour, and come and go.  At follow this pattern from 9:30 in the morning until 11 AM.  The patient states that she took some ibuprofen on last night that helped some.  The patient had the pain again today, and at this time she took some aspirin.  The patient denies any unusual sweating, nausea, vomiting, loss of bowel or bladder function, loss of consciousness.  There is been no recent accidents.  The patient states she does a good amount of lifting, because she has to live her 10-year-old and 43-year-old from time to time.  She also lifts heavy items related to things at her home.  There is been no previous history of similar pain.  Patient has no cardiac illness that she is aware of.  She denies smoking, alcohol, recreational or street drugs.  She is not had any recent travel out of the country.  She is not been exposed to any noxious gases or fumes.  No recent operations or procedures involving the chest.  No fever, no chills, no hemoptysis.  Patient has a history of migraines, anxiety, and palpitations.  The history is provided by the patient.    Past Medical History:  Diagnosis Date  . Allergy   . Contraceptive management 11/05/2013  . Depression   . GERD (gastroesophageal reflux disease)   . IUD (intrauterine device) in place 01/13/2016  . Migraines   . Missed periods 01/13/2016  . Vaginal Pap smear, abnormal      Patient Active Problem List   Diagnosis Date Noted  . IUD (intrauterine device) in place 01/13/2016  . Migraines 03/07/2013    Past Surgical History:  Procedure Laterality Date  . NO PAST SURGERIES       OB History    Gravida  3   Para  3   Term  3   Preterm      AB      Living  3     SAB      TAB      Ectopic      Multiple  0   Live Births  3            Home Medications    Prior to Admission medications   Medication Sig Start Date End Date Taking? Authorizing Provider  cholecalciferol (VITAMIN D) 1000 units tablet Take 1,000 Units by mouth daily.    [provider]  meloxicam (MOBIC) 15 MG tablet Take 1 tablet (15 mg total) by mouth daily. 10/11/17   Eustace Moore, MD  rizatriptan (MAXALT) 10 MG tablet TAKE 1 TABLET BY MOUTH ONCE AT ONSET OF SYMPTOMS, MAY REPEAT DOSE IN 2 HOURS IF NEEDED. 01/15/18   Eustace Moore, MD    Family History Family History  Problem Relation Age of Onset  . Other Mother        enlarged  heart  . Diabetes Mother   . Hypertension Mother   . Hyperlipidemia Mother   . Heart disease Mother        heart murmer  . Miscarriages / [place] Mother   . Hypertension Father   . Hyperlipidemia Father   . Cancer Father        prostate  . Other Brother        enlarged heart; colloid cyst of the third ventricle( of the brain)  . Cancer Paternal Grandmother        leukemia  . Cancer Paternal Grandfather        lung  . Hypertension Sister     Social History Social History   Tobacco Use  . Smoking status: Never Smoker  . Smokeless tobacco: Never Used  Substance Use Topics  . Alcohol use: Yes    Comment: occasional  . Drug use: No     Allergies   Patient has no known allergies.   Review of Systems Review of Systems  Constitutional: Negative for activity change, appetite change, chills, diaphoresis, fatigue and fever.       All ROS Neg except as noted in HPI  HENT: Negative for nosebleeds.    Eyes: Negative for photophobia and discharge.  Respiratory: Negative for cough, choking, shortness of breath, wheezing and stridor.   Cardiovascular: Positive for chest pain. Negative for palpitations and leg swelling.  Gastrointestinal: Negative for abdominal pain and blood in stool.  Genitourinary: Negative for dysuria, frequency and hematuria.  Musculoskeletal: Positive for neck pain. Negative for arthralgias and back pain.       Shoulder pain  Skin: Negative.   Neurological: Negative for dizziness, seizures and speech difficulty.  Psychiatric/Behavioral: Negative for confusion and hallucinations.     Physical Exam Updated Vital Signs BP 132/85 (BP Location: Left Arm)   Pulse 86   Temp (!) 97.5 F (36.4 C) (Oral)   Resp (!) 23   Ht 5\' 4"  (1.626 m)   Wt 126.1 kg   SpO2 100%   BMI 47.72 kg/m   Physical Exam Vitals signs and nursing note reviewed.  Constitutional:      Appearance: She is well-developed. She is not toxic-appearing.  HENT:     Head: Normocephalic.     Right Ear: Tympanic membrane and external ear normal.     Left Ear: Tympanic membrane and external ear normal.  Eyes:     General: Lids are normal.     Pupils: Pupils are equal, round, and reactive to light.  Neck:     Musculoskeletal: Normal range of motion and neck supple.     Vascular: No carotid bruit.  Cardiovascular:     Rate and Rhythm: Normal rate and regular rhythm.     Pulses: Normal pulses.     Heart sounds: Normal heart sounds. No murmur. No friction rub. No gallop.   Pulmonary:     Effort: No respiratory distress.     Breath sounds: Normal breath sounds.     Comments: Chaperone present during the examination.  There is mild left upper chest tenderness to palpation.  There is symmetrical rise and fall of the chest.  The patient speaks in complete sentences without problem. Abdominal:     General: Bowel sounds are normal.     Palpations: Abdomen is soft.     Tenderness: There is no abdominal  tenderness. There is no guarding.  Musculoskeletal: Normal range of motion.     Comments: Mild soreness with range of motion  of the left shoulder.  No evidence of any dislocation.  No hot joint appreciated.  The radial pulses 2+.  Capillary refill is less than 2 seconds bilaterally.  The radial pulses are 2+ bilaterally.  Lymphadenopathy:     Head:     Right side of head: No submandibular adenopathy.     Left side of head: No submandibular adenopathy.     Cervical: No cervical adenopathy.  Skin:    General: Skin is warm and dry.  Neurological:     Mental Status: She is alert and oriented to person, place, and time.     Cranial Nerves: No cranial nerve deficit.     Sensory: No sensory deficit.  Psychiatric:        Speech: Speech normal.      ED Treatments / Results  Labs (all labs ordered are listed, but only abnormal results are displayed) Labs Reviewed  CBC - Abnormal; Notable for the following components:      Result Value   MCH 25.7 (*)    All other components within normal limits  BASIC METABOLIC PANEL  TROPONIN I  D-DIMER, QUANTITATIVE (NOT AT [facility])  I-STAT BETA HCG BLOOD, ED ([facility], [facility], [facility] ONLY)    EKG EKG Interpretation  Date/Time:  Saturday September 15 2018 18:09:49 EST Ventricular Rate:  89 PR Interval:    QRS Duration: 99 QT Interval:  392 QTC Calculation: 477 R Axis:   69 Text Interpretation:  Sinus rhythm Baseline wander in lead(s) V1 V6 No old tracing to compare Confirmed by Raeford Razor [phone]) on 09/15/2018 6:12:51 PM   Radiology No results found.  Procedures Procedures (including critical care time)  Medications Ordered in ED Medications - No data to display   Initial Impression / Assessment and Plan / ED Course  I have reviewed the triage vital signs and the nursing notes.  Pertinent labs & imaging results that were available during my care of the patient were reviewed by me and considered in my medical decision making (see chart for  details).       Final Clinical Impressions(s) / ED Diagnoses MDM  Vital signs reviewed.  Pulse oximetry is 97 to 100% on room air.  Within normal limits by my interpretation.  I-STAT beta hCG is negative for pregnancy.  The basic metabolic panel shows the potassium to be slightly low at 3.2, otherwise normal. The anion gap is normal at 8.  The complete blood count is normal. The electrocardiogram shows a normal sinus rhythm.  There are no life-threatening arrhythmias, negative for acute STEMI.  The troponin is less than 0.03, negative for acute event.  The d-dimer is negative for acute event at 0.35.  The lipase is negative for any suspected pancreatitis at 25. Chest x-ray is negative.  Case reviewed and discussed with Dr. Juleen China.  An i-STAT lactic acid was placed on the patient's chart by accident.  The 2.4 value does not belong to this patient.  I have informed the patient of the findings on the examination, as well as the findings on the x-ray, EKG, and lab.  Questions were answered.  Patient is ambulatory in the room, as well as in the hall without any problem whatsoever.  Patient advised to see the primary physician or return to the emergency department if any changes in her condition, problems, or concerns.  Patient is in agreement with this plan.   Final diagnoses:  Chest wall pain  Muscle strain    ED Discharge Orders  None       Ivery QualeBryant, Kinsie Belford, PA-C 09/15/18 2059    Raeford RazorKohut, Stephen, MD 09/16/18 [phone]

## 2018-09-15 NOTE — ED Notes (Signed)
ERROR- istat value of 2.04 is incorrect for the patient. CRN notified

## 2018-09-15 NOTE — Discharge Instructions (Addendum)
Your xray, EKG, and lab test are within normal limits with the exception of your potassium being slightly low at 3.2. Please increase flood high in potassium. Your exam suggest a muscle strain and chest wall pain. Please soak you chest in warm Epsom Salt bath daily. Use tylenol and ibuprofen for soreness. See your MD or return to the ED if any changes in your condition, problem, or concerns.

## 2018-09-15 NOTE — ED Triage Notes (Signed)
Patient c/o left sided chest pain that radiates into left side neck and left shoulder. Shortness of breath exertion and dizziness. Patient states heaviness in chest x1 week with dull pain on left side starting yesterday.  Denies any cardiac hx. Per patient dry cough with exertion.

## 2018-10-16 ENCOUNTER — Other Ambulatory Visit: Payer: BLUE CROSS/BLUE SHIELD | Admitting: Adult Health

## 2018-10-22 ENCOUNTER — Other Ambulatory Visit (HOSPITAL_COMMUNITY)
Admission: RE | Admit: 2018-10-22 | Discharge: 2018-10-22 | Disposition: A | Discharge status: Home / Self Care | Payer: BLUE CROSS/BLUE SHIELD | Source: Ambulatory Visit | Attending: Adult Health | Admitting: Adult Health

## 2018-10-22 ENCOUNTER — Encounter: Payer: Self-pay | Admitting: Adult Health

## 2018-10-22 ENCOUNTER — Ambulatory Visit (INDEPENDENT_AMBULATORY_CARE_PROVIDER_SITE_OTHER): Payer: BLUE CROSS/BLUE SHIELD | Admitting: Adult Health

## 2018-10-22 VITALS — BP 117/72 | HR 79 | Resp 18 | Ht 64.0 in | Wt 297.0 lb

## 2018-10-22 DIAGNOSIS — Z1212 Encounter for screening for malignant neoplasm of rectum: Secondary | ICD-10-CM | POA: Diagnosis not present

## 2018-10-22 DIAGNOSIS — Z01419 Encounter for gynecological examination (general) (routine) without abnormal findings: Secondary | ICD-10-CM | POA: Insufficient documentation

## 2018-10-22 DIAGNOSIS — Z113 Encounter for screening for infections with a predominantly sexual mode of transmission: Secondary | ICD-10-CM | POA: Insufficient documentation

## 2018-10-22 DIAGNOSIS — Z3009 Encounter for other general counseling and advice on contraception: Secondary | ICD-10-CM | POA: Diagnosis present

## 2018-10-22 DIAGNOSIS — M722 Plantar fascial fibromatosis: Secondary | ICD-10-CM

## 2018-10-22 DIAGNOSIS — Z1211 Encounter for screening for malignant neoplasm of colon: Secondary | ICD-10-CM | POA: Insufficient documentation

## 2018-10-22 DIAGNOSIS — G5601 Carpal tunnel syndrome, right upper limb: Secondary | ICD-10-CM

## 2018-10-22 HISTORY — DX: Carpal tunnel syndrome, right upper limb: G56.01

## 2018-10-22 LAB — HEMOCCULT GUIAC POC 1CARD (OFFICE): FECAL OCCULT BLD: NEGATIVE

## 2018-10-22 NOTE — Progress Notes (Signed)
Patient ID: Kristin [name], female   DOB: 1981-03-19, 38 y.o.   MRN: [id number] History of Present Illness: Kristin [name] is a 38 year old black female, divorced, G3P3, in for well woman gyn exam and pap. She had Mirena IUD inserted 07/30/2015. PCP is Cheron Every NP.   Current Medications, Allergies, Past Medical History, Past Surgical History, Family History and Social History were reviewed in [employer] record.     Review of Systems: Patient denies any headaches, hearing loss, fatigue, blurred vision, shortness of breath, chest pain, abdominal pain, problems with bowel movements, urination, or intercourse(not having sex). No joint pain or mood swings. Has pain in right heel, esp in am and has numbness and tingling in right hand. 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    Physical Exam:BP 117/72 (BP Location: Right Arm, Patient Position: Sitting, Cuff Size: Large)   Pulse 79   Resp 18   Ht 5\' 4"  (1.626 m)   Wt 297 lb (134.7 kg)   BMI 50.98 kg/m  General:  Well developed, well nourished, no acute distress Skin:  Warm and dry Neck:  Midline trachea, normal thyroid, good ROM, no lymphadenopathy Lungs; Clear to auscultation bilaterally Breast:  No dominant palpable mass, retraction, or nipple discharge Cardiovascular: Regular rate and rhythm Abdomen:  Soft, non tender, no hepatosplenomegaly Pelvic:  External genitalia is normal in appearance, no lesions.  The vagina is normal in appearance. Urethra has no lesions or masses. The cervix is bulbous and smooth. Pap with HPV and GC/CHL performed, no IUD seen, till after pap, then string really short. Uterus is felt to be normal size, shape, and contour.  No adnexal masses or tenderness noted.Bladder is non tender, no masses felt. Rectal: Good sphincter tone, no polyps, or hemorrhoids felt.  Hemoccult negative. Extremities/musculoskeletal:  No swelling or varicosities noted, no clubbing or cyanosis, +Phalen and Tinel's  sign on right.  Psych:  No mood changes, alert and cooperative,seems happy Fall risk is low. PHQ 2 score 0.  Examination chaperoned by Marchelle Folks Rash LPN. Discussed PF and CTS.   Impression: 1. Encounter for gynecological examination with Papanicolaou smear of cervix   2. Family planning   3. Screening for STDs (sexually transmitted diseases)   4. Screening for colorectal cancer   5. Carpal tunnel syndrome of right wrist   6. Plantar fasciitis, right       Plan: Check HIV and RPR Rx given for cock up splint on right Try stretches and tennis ball and ice for right heel Physical in 1 year Pap in 3 if normal Mammogram yearly  Review handouts on CTS and PF.

## 2018-10-22 NOTE — Patient Instructions (Signed)
Carpal Tunnel Syndrome  Carpal tunnel syndrome is a condition that causes pain in your hand and arm. The carpal tunnel is a narrow area that is on the palm side of your wrist. Repeated wrist motion or certain diseases may cause swelling in the tunnel. This swelling can pinch the main nerve in the wrist (median nerve). What are the causes? This condition may be caused by:  Repeated wrist motions.  Wrist injuries.  Arthritis.  A sac of fluid (cyst) or abnormal growth (tumor) in the carpal tunnel.  Fluid buildup during pregnancy. Sometimes the cause is not known. What increases the risk? The following factors may make you more likely to develop this condition:  Having a job in which you move your wrist in the same way many times. This includes jobs like being a butcher or a cashier.  Being a woman.  Having other health conditions, such as: ? Diabetes. ? Obesity. ? A thyroid gland that is not active enough (hypothyroidism). ? Kidney failure. What are the signs or symptoms? Symptoms of this condition include:  A tingling feeling in your fingers.  Tingling or a loss of feeling (numbness) in your hand.  Pain in your entire arm. This pain may get worse when you bend your wrist and elbow for a long time.  Pain in your wrist that goes up your arm to your shoulder.  Pain that goes down into your palm or fingers.  A weak feeling in your hands. You may find it hard to grab and hold items. You may feel worse at night. How is this diagnosed? This condition is diagnosed with a medical history and physical exam. You may also have tests, such as:  Electromyogram (EMG). This test checks the signals that the nerves send to the muscles.  Nerve conduction study. This test checks how well signals pass through your nerves.  Imaging tests, such as X-rays, ultrasound, and MRI. These tests check for what might be the cause of your condition. How is this treated? This condition may be treated  with:  Lifestyle changes. You will be asked to stop or change the activity that caused your problem.  Doing exercise and activities that make bones and muscles stronger (physical therapy).  Learning how to use your hand again (occupational therapy).  Medicines for pain and swelling (inflammation). You may have injections in your wrist.  A wrist splint.  Surgery. Follow these instructions at home: If you have a splint:  Wear the splint as told by your doctor. Remove it only as told by your doctor.  Loosen the splint if your fingers: ? Tingle. ? Lose feeling (become numb). ? Turn cold and blue.  Keep the splint clean.  If the splint is not waterproof: ? Do not let it get wet. ? Cover it with a watertight covering when you take a bath or a shower. Managing pain, stiffness, and swelling   If told, put ice on the painful area: ? If you have a removable splint, remove it as told by your doctor. ? Put ice in a plastic bag. ? Place a towel between your skin and the bag. ? Leave the ice on for 20 minutes, 2-3 times per day. General instructions  Take over-the-counter and prescription medicines only as told by your doctor.  Rest your wrist from any activity that may cause pain. If needed, talk with your boss at work about changes that can help your wrist heal.  Do any exercises as told by your doctor,   physical therapist, or occupational therapist.  Keep all follow-up visits as told by your doctor. This is important. Contact a doctor if:  You have new symptoms.  Medicine does not help your pain.  Your symptoms get worse. Get help right away if:  You have very bad numbness or tingling in your wrist or hand. Summary  Carpal tunnel syndrome is a condition that causes pain in your hand and arm.  It is often caused by repeated wrist motions.  Lifestyle changes and medicines are used to treat this problem. Surgery may help in very bad cases.  Follow your doctor's  instructions about wearing a splint, resting your wrist, keeping follow-up visits, and calling for help. This information is not intended to replace advice given to you by your health care provider. Make sure you discuss any questions you have with your health care provider. Document Released: 09/08/2011 Document Revised: 01/26/2018 Document Reviewed: 01/26/2018 Elsevier Interactive Patient Education  2019 [employer] Plantar Fasciitis  Plantar fasciitis is a painful foot condition that affects the heel. It occurs when the band of tissue that connects the toes to the heel bone (plantar fascia) becomes irritated. This can happen as the result of exercising too much or doing other repetitive activities (overuse injury). The pain from plantar fasciitis can range from mild irritation to severe pain that makes it difficult to walk or move. The pain is usually worse in the morning after sleeping, or after sitting or lying down for a while. Pain may also be worse after long periods of walking or standing. What are the causes? This condition may be caused by:  Standing for long periods of time.  Wearing shoes that do not have good arch support.  Doing activities that put stress on joints (high-impact activities), including running, aerobics, and ballet.  Being overweight.  An abnormal way of walking (gait).  Tight muscles in the back of your lower leg (calf).  High arches in your feet.  Starting a new athletic activity. What are the signs or symptoms? The main symptom of this condition is heel pain. Pain may:  Be worse with first steps after a time of rest, especially in the morning after sleeping or after you have been sitting or lying down for a while.  Be worse after long periods of standing still.  Decrease after 30-45 minutes of activity, such as gentle walking. How is this diagnosed? This condition may be diagnosed based on your medical history and your symptoms. Your health care  provider may ask questions about your activity level. Your health care provider will do a physical exam to check for:  A tender area on the bottom of your foot.  A high arch in your foot.  Pain when you move your foot.  Difficulty moving your foot. You may have imaging tests to confirm the diagnosis, such as:  X-rays.  Ultrasound.  MRI. How is this treated? Treatment for plantar fasciitis depends on how severe your condition is. Treatment may include:  Rest, ice, applying pressure (compression), and raising the affected foot (elevation). This may be called RICE therapy. Your health care provider may recommend RICE therapy along with over-the-counter pain medicines to manage your pain.  Exercises to stretch your calves and your plantar fascia.  A splint that holds your foot in a stretched, upward position while you sleep (night splint).  Physical therapy to relieve symptoms and prevent problems in the future.  Injections of steroid medicine (cortisone) to relieve pain and inflammation.  Stimulating your plantar fascia with electrical impulses (extracorporeal shock wave therapy). This is usually the last treatment option before surgery.  Surgery, if other treatments have not worked after 12 months. Follow these instructions at home:  Managing pain, stiffness, and swelling  If directed, put ice on the painful area: ? Put ice in a plastic bag, or use a frozen bottle of water. ? Place a towel between your skin and the bag or bottle. ? Roll the bottom of your foot over the bag or bottle. ? Do this for 20 minutes, 2-3 times a day.  Wear athletic shoes that have air-sole or gel-sole cushions, or try wearing soft shoe inserts that are designed for plantar fasciitis.  Raise (elevate) your foot above the level of your heart while you are sitting or lying down. Activity  Avoid activities that cause pain. Ask your health care provider what activities are safe for you.  Do physical  therapy exercises and stretches as told by your health care provider.  Try activities and forms of exercise that are easier on your joints (low-impact). Examples include swimming, water aerobics, and biking. General instructions  Take over-the-counter and prescription medicines only as told by your health care provider.  Wear a night splint while sleeping, if told by your health care provider. Loosen the splint if your toes tingle, become numb, or turn cold and blue.  Maintain a healthy weight, or work with your health care provider to lose weight as needed.  Keep all follow-up visits as told by your health care provider. This is important. Contact a health care provider if you:  Have symptoms that do not go away after caring for yourself at home.  Have pain that gets worse.  Have pain that affects your ability to move or do your daily activities. Summary  Plantar fasciitis is a painful foot condition that affects the heel. It occurs when the band of tissue that connects the toes to the heel bone (plantar fascia) becomes irritated.  The main symptom of this condition is heel pain that may be worse after exercising too much or standing still for a long time.  Treatment varies, but it usually starts with rest, ice, compression, and elevation (RICE therapy) and over-the-counter medicines to manage pain. This information is not intended to replace advice given to you by your health care provider. Make sure you discuss any questions you have with your health care provider. Document Released: 06/14/2001 Document Revised: 07/17/2017 Document Reviewed: 07/17/2017 Elsevier Interactive Patient Education  2019 [employer]

## 2018-10-23 LAB — RPR: RPR: NONREACTIVE

## 2018-10-23 LAB — HIV ANTIBODY (ROUTINE TESTING W REFLEX): HIV Screen 4th Generation wRfx: NONREACTIVE

## 2018-10-26 LAB — CYTOLOGY - PAP
CHLAMYDIA, DNA PROBE: NEGATIVE
Diagnosis: NEGATIVE
HPV: NOT DETECTED
Neisseria Gonorrhea: NEGATIVE

## 2018-12-30 ENCOUNTER — Emergency Department (HOSPITAL_COMMUNITY)
Admission: EM | Admit: 2018-12-30 | Discharge: 2018-12-30 | Disposition: A | Discharge status: Home / Self Care | Payer: BLUE CROSS/BLUE SHIELD | Attending: Emergency Medicine | Admitting: Emergency Medicine

## 2018-12-30 ENCOUNTER — Emergency Department (HOSPITAL_COMMUNITY): Payer: BLUE CROSS/BLUE SHIELD

## 2018-12-30 ENCOUNTER — Other Ambulatory Visit: Payer: Self-pay

## 2018-12-30 ENCOUNTER — Encounter (HOSPITAL_COMMUNITY): Payer: Self-pay | Admitting: Emergency Medicine

## 2018-12-30 DIAGNOSIS — R079 Chest pain, unspecified: Secondary | ICD-10-CM

## 2018-12-30 DIAGNOSIS — Z20828 Contact with and (suspected) exposure to other viral communicable diseases: Secondary | ICD-10-CM | POA: Insufficient documentation

## 2018-12-30 DIAGNOSIS — R0789 Other chest pain: Secondary | ICD-10-CM | POA: Diagnosis present

## 2018-12-30 LAB — BASIC METABOLIC PANEL
Anion gap: 8 (ref 5–15)
BUN: 7 mg/dL (ref 6–20)
CHLORIDE: 104 mmol/L (ref 98–111)
CO2: 25 mmol/L (ref 22–32)
Calcium: 8.6 mg/dL — ABNORMAL LOW (ref 8.9–10.3)
Creatinine, Ser: 0.68 mg/dL (ref 0.44–1.00)
GFR calc Af Amer: >60 mL/min (ref >60)
GFR calc non Af Amer: >60 mL/min (ref >60)
Glucose, Bld: 93 mg/dL (ref 70–99)
Potassium: 3.5 mmol/L (ref 3.5–5.1)
SODIUM: 137 mmol/L (ref 135–145)

## 2018-12-30 LAB — CBC
HEMATOCRIT: 39 % (ref 36.0–46.0)
Hemoglobin: 12.3 g/dL (ref 12.0–15.0)
MCH: 26.7 pg (ref 26.0–34.0)
MCHC: 31.5 g/dL (ref 30.0–36.0)
MCV: 84.8 fL (ref 80.0–100.0)
Platelets: 259 10*3/uL (ref 150–400)
RBC: 4.6 MIL/uL (ref 3.87–5.11)
RDW: 13.2 % (ref 11.5–15.5)
WBC: 6.2 10*3/uL (ref 4.0–10.5)
nRBC: 0 % (ref 0.0–0.2)

## 2018-12-30 LAB — D-DIMER, QUANTITATIVE: D-Dimer, Quant: 0.45 ug/mL-FEU (ref 0.00–0.50)

## 2018-12-30 LAB — TROPONIN I: Troponin I: <0.03 ng/mL (ref <0.03)

## 2018-12-30 MED ORDER — FAMOTIDINE 20 MG PO TABS
20.0000 mg | ORAL_TABLET | Freq: Once | ORAL | Status: AC
  Administered 2018-12-30: 20 mg via ORAL
  Filled 2018-12-30: qty 1

## 2018-12-30 MED ORDER — OMEPRAZOLE 20 MG PO CPDR: 20.0000 mg | DELAYED_RELEASE_CAPSULE | Freq: Every day | ORAL | 1 refills | Status: DC

## 2018-12-30 MED ORDER — SODIUM CHLORIDE 0.9% FLUSH: 3.0000 mL | Freq: Once | INTRAVENOUS | Status: DC

## 2018-12-30 NOTE — ED Triage Notes (Addendum)
Patient c/o mid-sternal chest pain that radiates into left side and back. Patient states some shortness of breath, left "neck tightness", nausea, and dizziness. Per patient heartburn yesterday and last night. Patient reports waking this morning with upper abd pain with watery diarrhea, nausea, and vomiting. Denies any fevers or urinary symptoms. Patient recently for URI and given prednisone and antibiotics after being seen twice by PCP. Patient states symptoms improved (cough, wheezing, and hoarseness) but had hot flashes related to prednisone. Patient states tested for flu and covid-19, test for covid-19 has not returned.

## 2018-12-30 NOTE — ED Notes (Signed)
Patient denies pain and is resting comfortably.  

## 2018-12-30 NOTE — Discharge Instructions (Addendum)
See your Physician for recheck next week.  Return if any problems.   °

## 2018-12-30 NOTE — ED Provider Notes (Signed)
[facility] EMERGENCY DEPARTMENT Provider Note   CSN: [id number] Arrival date & time: 12/30/18  1607    History   Chief Complaint Chief Complaint  Patient presents with  . Chest Pain    HPI Kristin [name] is a 38 y.o. female.     The history is provided by the patient. No language interpreter was used.  Chest Pain  Pain location:  L chest Pain quality: aching   Pain radiates to:  Does not radiate Pain severity:  Moderate Onset quality:  Gradual Duration:  2 days Timing:  Constant Progression:  Worsening Chronicity:  New Relieved by:  Nothing Worsened by:  Nothing Ineffective treatments:  None tried Associated symptoms: cough    Pt reports she began having a cough 4 weeks ago.  Pt has been treated with antibiotics and prednisone.  Pt reports she is currently using inhalers.  Pt reports she has soreness on the left side of her chest.  Pt's Md has sent a test for Covid 19 which is pending  Past Medical History:  Diagnosis Date  . Allergy   . Carpal tunnel syndrome of right wrist 10/22/2018  . Contraceptive management 11/05/2013  . Depression   . GERD (gastroesophageal reflux disease)   . IUD (intrauterine device) in place 01/13/2016  . Migraines   . Missed periods 01/13/2016  . Vaginal Pap smear, abnormal     Patient Active Problem List   Diagnosis Date Noted  . Screening for colorectal cancer 10/22/2018  . Screening for STDs (sexually transmitted diseases) 10/22/2018  . Encounter for gynecological examination with Papanicolaou smear of cervix 10/22/2018  . Family planning 10/22/2018  . Plantar fasciitis, right 10/22/2018  . Carpal tunnel syndrome of right wrist 10/22/2018  . IUD (intrauterine device) in place 01/13/2016  . Migraines 03/07/2013    Past Surgical History:  Procedure Laterality Date  . NO PAST SURGERIES       OB History    Gravida  3   Para  3   Term  3   Preterm      AB      Living  3     SAB      TAB      Ectopic      Multiple  0   Live Births  3            Home Medications    Prior to Admission medications   Medication Sig Start Date End Date Taking? Authorizing Provider  cetirizine (ZYRTEC) 10 MG tablet Take 10 mg by mouth daily.  12/25/18  Yes [provider]  fluticasone (FLONASE) 50 MCG/ACT nasal spray Place 2 sprays into both nostrils daily.  12/25/18  Yes [provider]  levonorgestrel (MIRENA) 20 MCG/24HR IUD 1 each by Intrauterine route once.   Yes [provider]  rizatriptan (MAXALT) 10 MG tablet TAKE 1 TABLET BY MOUTH ONCE AT ONSET OF SYMPTOMS, MAY REPEAT DOSE IN 2 HOURS IF NEEDED. Patient taking differently: Take 10 mg by mouth See admin instructions. TAKE 1 TABLET BY MOUTH ONCE AT ONSET OF SYMPTOMS, MAY REPEAT DOSE IN 2 HOURS IF NEEDED. 01/15/18  Yes Eustace Moore, MD  amoxicillin-clavulanate (AUGMENTIN) 875-125 MG tablet Take 1 tablet by mouth 2 (two) times daily. 10 day course COMPLETED    [provider]  omeprazole (PRILOSEC) 20 MG capsule Take 1 capsule (20 mg total) by mouth daily. 12/30/18 12/30/19  Elson Areas, PA-C    Family History Family History  Problem  Relation Age of Onset  . Other Mother        enlarged heart  . Diabetes Mother   . Hypertension Mother   . Hyperlipidemia Mother   . Heart disease Mother        heart murmer  . Miscarriages / [place] Mother   . Hypertension Father   . Hyperlipidemia Father   . Cancer Father        prostate  . Other Brother        enlarged heart; colloid cyst of the third ventricle( of the brain)  . Cancer Paternal Grandmother        leukemia  . Cancer Paternal Grandfather        lung  . Hypertension Sister     Social History Social History   Tobacco Use  . Smoking status: Never Smoker  . Smokeless tobacco: Never Used  Substance Use Topics  . Alcohol use: Yes    Comment: occasional  . Drug use: No     Allergies   Patient has no known allergies.   Review of Systems  Review of Systems  Respiratory: Positive for cough.   Cardiovascular: Positive for chest pain.  All other systems reviewed and are negative.    Physical Exam Updated Vital Signs BP 101/65   Pulse 85   Temp 98.7 F (37.1 C) (Oral)   Resp 18   Ht  (1.626 m)   Wt 133.8 kg   SpO2 100%   BMI 50.64 kg/m   Physical Exam Vitals signs and nursing note reviewed.  Constitutional:      Appearance: She is well-developed and normal weight.  HENT:     Head: Normocephalic.  Eyes:     Pupils: Pupils are equal, round, and reactive to light.  Neck:     Musculoskeletal: Normal range of motion.  Cardiovascular:     Rate and Rhythm: Normal rate. Rhythm irregular.     Heart sounds: Normal heart sounds.  Pulmonary:     Effort: Pulmonary effort is normal.     Breath sounds: Normal breath sounds.  Abdominal:     General: There is no distension.     Palpations: Abdomen is soft.     Tenderness: There is no guarding.  Musculoskeletal: Normal range of motion.  Skin:    General: Skin is warm.     Capillary Refill: Capillary refill takes less than 2 seconds.  Neurological:     General: No focal deficit present.     Mental Status: She is alert and oriented to person, place, and time.  Psychiatric:        Mood and Affect: Mood normal.      ED Treatments / Results  Labs (all labs ordered are listed, but only abnormal results are displayed) Labs Reviewed  BASIC METABOLIC PANEL - Abnormal; Notable for the following components:      Result Value   Calcium 8.6 (*)    All other components within normal limits  CBC  TROPONIN I  D-DIMER, QUANTITATIVE (NOT AT [facility])    EKG EKG Interpretation  Date/Time:  Sunday December 30 2018 16:23:45 EDT Ventricular Rate:  105 PR Interval:    QRS Duration: 91 QT Interval:  342 QTC Calculation: 452 R Axis:   80 Text Interpretation:  Sinus tachycardia Confirmed by Donnetta Hutching ([id number]) on 12/30/2018 4:46:46 PM   Radiology Dg Chest 2 View   Result Date: 12/30/2018 CLINICAL DATA:  Chest pain. EXAM: CHEST - 2 VIEW COMPARISON:  September 15, 2018 FINDINGS: The heart size and mediastinal contours are within normal limits. Both lungs are clear. The visualized skeletal structures are unremarkable. IMPRESSION: No active cardiopulmonary disease. Electronically Signed   By: Gerome Sam III M.D   On: 12/30/2018 17:01    Procedures Procedures (including critical care time)  Medications Ordered in ED Medications  sodium chloride flush (NS) 0.9 % injection 3 mL (3 mLs Intravenous Not Given 12/30/18 1635)  famotidine (PEPCID) tablet 20 mg (20 mg Oral Given 12/30/18 1733)     Initial Impression / Assessment and Plan / ED Course  I have reviewed the triage vital signs and the nursing notes.  Pertinent labs & imaging results that were available during my care of the patient were reviewed by me and considered in my medical decision making (see chart for details).        MDM  Troponin is normal ddimer is normal.  Pt reports pepcid did help discomfort.  Pt advised to continue home quarantine until covid results.  Pt given rx for prilosec.      Person Under Monitoring Name: Kristin [name]  Location: [address] [place] [address]   Infection Prevention Recommendations for Individuals Confirmed to have, or Being Evaluated for, 2019 Novel Coronavirus (COVID-19) Infection Who Receive Care at Home  Individuals who are confirmed to have, or are being evaluated for, COVID-19 should follow the prevention steps below until a healthcare provider or local or state health department says they can return to normal activities.  Stay home except to get medical care You should restrict activities outside your home, except for getting medical care. Do not go to work, school, or public areas, and do not use public transportation or taxis.  Call ahead before visiting your doctor Before your medical appointment, call the healthcare  provider and tell them that you have, or are being evaluated for, COVID-19 infection. This will help the healthcare provider's office take steps to keep other people from getting infected. Ask your healthcare provider to call the local or state health department.  Monitor your symptoms Seek prompt medical attention if your illness is worsening (e.g., difficulty breathing). Before going to your medical appointment, call the healthcare provider and tell them that you have, or are being evaluated for, COVID-19 infection. Ask your healthcare provider to call the local or state health department.  Wear a facemask You should wear a facemask that covers your nose and mouth when you are in the same room with other people and when you visit a healthcare provider. People who live with or visit you should also wear a facemask while they are in the same room with you.  Separate yourself from other people in your home As much as possible, you should stay in a different room from other people in your home. Also, you should use a separate bathroom, if available.  Avoid sharing household items You should not share dishes, drinking glasses, cups, eating utensils, towels, bedding, or other items with other people in your home. After using these items, you should wash them thoroughly with soap and water.  Cover your coughs and sneezes Cover your mouth and nose with a tissue when you cough or sneeze, or you can cough or sneeze into your sleeve. Throw used tissues in a lined trash can, and immediately wash your hands with soap and water for at least 20 seconds or use an alcohol-based hand rub.  Wash your [employer] your hands often and thoroughly with soap  and water for at least 20 seconds. You can use an alcohol-based hand sanitizer if soap and water are not available and if your hands are not visibly dirty. Avoid touching your eyes, nose, and mouth with unwashed hands.   Prevention Steps for Caregivers  and Household Members of Individuals Confirmed to have, or Being Evaluated for, COVID-19 Infection Being Cared for in the Home  If you live with, or provide care at home for, a person confirmed to have, or being evaluated for, COVID-19 infection please follow these guidelines to prevent infection:  Follow healthcare provider's instructions Make sure that you understand and can help the patient follow any healthcare provider instructions for all care.  Provide for the patient's basic needs You should help the patient with basic needs in the home and provide support for getting groceries, prescriptions, and other personal needs.  Monitor the patient's symptoms If they are getting sicker, call his or her medical provider and tell them that the patient has, or is being evaluated for, COVID-19 infection. This will help the healthcare provider's office take steps to keep other people from getting infected. Ask the healthcare provider to call the local or state health department.  Limit the number of people who have contact with the patient  If possible, have only one caregiver for the patient.  Other household members should stay in another home or place of residence. If this is not possible, they should stay  in another room, or be separated from the patient as much as possible. Use a separate bathroom, if available.  Restrict visitors who do not have an essential need to be in the home.  Keep older adults, very young children, and other sick people away from the patient Keep older adults, very young children, and those who have compromised immune systems or chronic health conditions away from the patient. This includes people with chronic heart, lung, or kidney conditions, diabetes, and cancer.  Ensure good ventilation Make sure that shared spaces in the home have good air flow, such as from an air conditioner or an opened window, weather permitting.  Wash your hands often  Wash your  hands often and thoroughly with soap and water for at least 20 seconds. You can use an alcohol based hand sanitizer if soap and water are not available and if your hands are not visibly dirty.  Avoid touching your eyes, nose, and mouth with unwashed hands.  Use disposable paper towels to dry your hands. If not available, use dedicated cloth towels and replace them when they become wet.  Wear a facemask and gloves  Wear a disposable facemask at all times in the room and gloves when you touch or have contact with the patient's blood, body fluids, and/or secretions or excretions, such as sweat, saliva, sputum, nasal mucus, vomit, urine, or feces.  Ensure the mask fits over your nose and mouth tightly, and do not touch it during use.  Throw out disposable facemasks and gloves after using them. Do not reuse.  Wash your hands immediately after removing your facemask and gloves.  If your personal clothing becomes contaminated, carefully remove clothing and launder. Wash your hands after handling contaminated clothing.  Place all used disposable facemasks, gloves, and other waste in a lined container before disposing them with other household waste.  Remove gloves and wash your hands immediately after handling these items.  Do not share dishes, glasses, or other household items with the patient  Avoid sharing household items. You should not  share dishes, drinking glasses, cups, eating utensils, towels, bedding, or other items with a patient who is confirmed to have, or being evaluated for, COVID-19 infection.  After the person uses these items, you should wash them thoroughly with soap and water.  Wash laundry thoroughly  Immediately remove and wash clothes or bedding that have blood, body fluids, and/or secretions or excretions, such as sweat, saliva, sputum, nasal mucus, vomit, urine, or feces, on them.  Wear gloves when handling laundry from the patient.  Read and follow directions on  labels of laundry or clothing items and detergent. In general, wash and dry with the warmest temperatures recommended on the label.  Clean all areas the individual has used often  Clean all touchable surfaces, such as counters, tabletops, doorknobs, bathroom fixtures, toilets, phones, keyboards, tablets, and bedside tables, every day. Also, clean any surfaces that may have blood, body fluids, and/or secretions or excretions on them.  Wear gloves when cleaning surfaces the patient has come in contact with.  Use a diluted bleach solution (e.g., dilute bleach with 1 part bleach and 10 parts water) or a household disinfectant with a label that says EPA-registered for coronaviruses. To make a bleach solution at home, add 1 tablespoon of bleach to 1 quart (4 cups) of water. For a larger supply, add  cup of bleach to 1 gallon (16 cups) of water.  Read labels of cleaning products and follow recommendations provided on product labels. Labels contain instructions for safe and effective use of the cleaning product including precautions you should take when applying the product, such as wearing gloves or eye protection and making sure you have good ventilation during use of the product.  Remove gloves and wash hands immediately after cleaning.  Monitor yourself for signs and symptoms of illness Caregivers and household members are considered close contacts, should monitor their health, and will be asked to limit movement outside of the home to the extent possible. Follow the monitoring steps for close contacts listed on the symptom monitoring form.   ? If you have additional questions, contact your local health department or call the epidemiologist on call at [phone] (available 24/7). ? This guidance is subject to change. For the most up-to-date guidance from [facility], please refer to their website: [email or web address] Final Clinical Impressions(s)  / ED Diagnoses   Final diagnoses:  Nonspecific chest pain    ED Discharge Orders         Ordered    omeprazole (PRILOSEC) 20 MG capsule  Daily     12/30/18 1855        An After Visit Summary was printed and given to the patient.    Osie Cheeks 12/30/18 1929    Donnetta Hutching, MD 12/30/18 2015

## 2019-01-03 ENCOUNTER — Encounter (HOSPITAL_COMMUNITY): Payer: Self-pay | Admitting: Emergency Medicine

## 2019-01-03 ENCOUNTER — Emergency Department (HOSPITAL_COMMUNITY)
Admission: EM | Admit: 2019-01-03 | Discharge: 2019-01-03 | Disposition: A | Discharge status: Home / Self Care | Payer: BLUE CROSS/BLUE SHIELD | Attending: Emergency Medicine | Admitting: Emergency Medicine

## 2019-01-03 ENCOUNTER — Emergency Department (HOSPITAL_COMMUNITY): Payer: BLUE CROSS/BLUE SHIELD

## 2019-01-03 ENCOUNTER — Other Ambulatory Visit: Payer: Self-pay

## 2019-01-03 DIAGNOSIS — M542 Cervicalgia: Secondary | ICD-10-CM | POA: Insufficient documentation

## 2019-01-03 LAB — CBC WITH DIFFERENTIAL/PLATELET
Abs Immature Granulocytes: 0.02 10*3/uL (ref 0.00–0.07)
Basophils Absolute: 0 10*3/uL (ref 0.0–0.1)
Basophils Relative: 0 %
Eosinophils Absolute: 0.2 10*3/uL (ref 0.0–0.5)
Eosinophils Relative: 3 %
HCT: 37.6 % (ref 36.0–46.0)
Hemoglobin: 12.1 g/dL (ref 12.0–15.0)
Immature Granulocytes: 0 %
Lymphocytes Relative: 35 %
Lymphs Abs: 2.4 10*3/uL (ref 0.7–4.0)
MCH: 27.1 pg (ref 26.0–34.0)
MCHC: 32.2 g/dL (ref 30.0–36.0)
MCV: 84.3 fL (ref 80.0–100.0)
Monocytes Absolute: 0.5 10*3/uL (ref 0.1–1.0)
Monocytes Relative: 8 %
Neutro Abs: 3.6 10*3/uL (ref 1.7–7.7)
Neutrophils Relative %: 54 %
Platelets: 250 10*3/uL (ref 150–400)
RBC: 4.46 MIL/uL (ref 3.87–5.11)
RDW: 13.2 % (ref 11.5–15.5)
WBC: 6.7 10*3/uL (ref 4.0–10.5)
nRBC: 0 % (ref 0.0–0.2)

## 2019-01-03 LAB — COMPREHENSIVE METABOLIC PANEL
ALT: 23 U/L (ref 0–44)
AST: 21 U/L (ref 15–41)
Albumin: 3.8 g/dL (ref 3.5–5.0)
Alkaline Phosphatase: 102 U/L (ref 38–126)
Anion gap: 9 (ref 5–15)
BUN: 8 mg/dL (ref 6–20)
CO2: 23 mmol/L (ref 22–32)
Calcium: 8.6 mg/dL — ABNORMAL LOW (ref 8.9–10.3)
Chloride: 104 mmol/L (ref 98–111)
Creatinine, Ser: 0.71 mg/dL (ref 0.44–1.00)
GFR calc Af Amer: >60 mL/min (ref >60)
GFR calc non Af Amer: >60 mL/min (ref >60)
Glucose, Bld: 101 mg/dL — ABNORMAL HIGH (ref 70–99)
Potassium: 3.5 mmol/L (ref 3.5–5.1)
Sodium: 136 mmol/L (ref 135–145)
Total Bilirubin: 0.2 mg/dL — ABNORMAL LOW (ref 0.3–1.2)
Total Protein: 7.2 g/dL (ref 6.5–8.1)

## 2019-01-03 MED ORDER — IOHEXOL 300 MG/ML  SOLN
75.0000 mL | Freq: Once | INTRAMUSCULAR | Status: AC | PRN
  Administered 2019-01-03: 22:00:00 75 mL via INTRAVENOUS

## 2019-01-03 NOTE — ED Provider Notes (Signed)
[facility] EMERGENCY DEPARTMENT Provider Note   CSN: [id number] Arrival date & time: 01/03/19  2049    History   Chief Complaint Chief Complaint  Patient presents with  . Shortness of Breath    HPI Kristin [name] is a 38 y.o. female.     Patient complains of shortness of breath.  Patient states she feels like she cannot take a deep breath because the left side of her neck is preventing her from doing it.  Patient had a complete work-up with chest x-ray d-dimer troponin EKG and basic labs recently which was also negative.  The history is provided by the patient. No language interpreter was used.  Shortness of Breath  Severity:  Mild Onset quality:  Gradual Timing:  Constant Progression:  Waxing and waning Chronicity:  New Context: not activity   Relieved by:  Nothing Associated symptoms: no abdominal pain, no chest pain, no cough, no headaches and no rash     Past Medical History:  Diagnosis Date  . Allergy   . Carpal tunnel syndrome of right wrist 10/22/2018  . Contraceptive management 11/05/2013  . Depression   . GERD (gastroesophageal reflux disease)   . IUD (intrauterine device) in place 01/13/2016  . Migraines   . Missed periods 01/13/2016  . Vaginal Pap smear, abnormal     Patient Active Problem List   Diagnosis Date Noted  . Screening for colorectal cancer 10/22/2018  . Screening for STDs (sexually transmitted diseases) 10/22/2018  . Encounter for gynecological examination with Papanicolaou smear of cervix 10/22/2018  . Family planning 10/22/2018  . Plantar fasciitis, right 10/22/2018  . Carpal tunnel syndrome of right wrist 10/22/2018  . IUD (intrauterine device) in place 01/13/2016  . Migraines 03/07/2013    Past Surgical History:  Procedure Laterality Date  . NO PAST SURGERIES       OB History    Gravida  3   Para  3   Term  3   Preterm      AB      Living  3     SAB      TAB      Ectopic      Multiple  0   Live Births  3             Home Medications    Prior to Admission medications   Medication Sig Start Date End Date Taking? Authorizing Provider  amoxicillin-clavulanate (AUGMENTIN) 875-125 MG tablet Take 1 tablet by mouth 2 (two) times daily. 10 day course COMPLETED    [provider]  cetirizine (ZYRTEC) 10 MG tablet Take 10 mg by mouth daily.  12/25/18   [provider]  fluticasone (FLONASE) 50 MCG/ACT nasal spray Place 2 sprays into both nostrils daily.  12/25/18   [provider]  levonorgestrel (MIRENA) 20 MCG/24HR IUD 1 each by Intrauterine route once.    [provider]  omeprazole (PRILOSEC) 20 MG capsule Take 1 capsule (20 mg total) by mouth daily. 12/30/18 12/30/19  Elson Areas, PA-C  rizatriptan (MAXALT) 10 MG tablet TAKE 1 TABLET BY MOUTH ONCE AT ONSET OF SYMPTOMS, MAY REPEAT DOSE IN 2 HOURS IF NEEDED. Patient taking differently: Take 10 mg by mouth See admin instructions. TAKE 1 TABLET BY MOUTH ONCE AT ONSET OF SYMPTOMS, MAY REPEAT DOSE IN 2 HOURS IF NEEDED. 01/15/18   Eustace Moore, MD    Family History Family History  Problem Relation Age of Onset  . Other Mother  enlarged heart  . Diabetes Mother   . Hypertension Mother   . Hyperlipidemia Mother   . Heart disease Mother        heart murmer  . Miscarriages / [place] Mother   . Hypertension Father   . Hyperlipidemia Father   . Cancer Father        prostate  . Other Brother        enlarged heart; colloid cyst of the third ventricle( of the brain)  . Cancer Paternal Grandmother        leukemia  . Cancer Paternal Grandfather        lung  . Hypertension Sister     Social History Social History   Tobacco Use  . Smoking status: Never Smoker  . Smokeless tobacco: Never Used  Substance Use Topics  . Alcohol use: Yes    Comment: occasional  . Drug use: No     Allergies   Patient has no known allergies.   Review of Systems Review of Systems  Constitutional: Negative  for appetite change and fatigue.  HENT: Negative for congestion, ear discharge and sinus pressure.   Eyes: Negative for discharge.  Respiratory: Positive for shortness of breath. Negative for cough.        Patient states she cannot take a deep breath because she feels like the left side of her neck is preventing her from breathing  Cardiovascular: Negative for chest pain.  Gastrointestinal: Negative for abdominal pain and diarrhea.  Genitourinary: Negative for frequency and hematuria.  Musculoskeletal: Negative for back pain.  Skin: Negative for rash.  Neurological: Negative for seizures and headaches.  Psychiatric/Behavioral: Negative for hallucinations.     Physical Exam Updated Vital Signs BP 116/65 (BP Location: Right Arm)   Pulse 79   Temp 98.5 F (36.9 C) (Oral)   Resp (!) 21   Ht  (1.626 m)   Wt 133.8 kg   SpO2 100%   BMI 50.63 kg/m   Physical Exam Vitals signs and nursing note reviewed.  Constitutional:      Appearance: She is well-developed.  HENT:     Head: Normocephalic.     Comments: Minimal tenderness left lateral neck anterior    Nose: Nose normal.  Eyes:     General: No scleral icterus.    Conjunctiva/sclera: Conjunctivae normal.     Pupils: Pupils are equal, round, and reactive to light.  Neck:     Musculoskeletal: Neck supple.     Thyroid: No thyromegaly.  Cardiovascular:     Rate and Rhythm: Normal rate and regular rhythm.     Heart sounds: No murmur. No friction rub. No gallop.   Pulmonary:     Breath sounds: No stridor. No wheezing or rales.  Chest:     Chest wall: No tenderness.  Abdominal:     General: There is no distension.     Tenderness: There is no abdominal tenderness. There is no rebound.  Musculoskeletal: Normal range of motion.  Lymphadenopathy:     Cervical: No cervical adenopathy.  Skin:    Findings: No erythema or rash.  Neurological:     Mental Status: She is oriented to person, place, and time.     Motor: No abnormal  muscle tone.     Coordination: Coordination normal.  Psychiatric:        Behavior: Behavior normal.      ED Treatments / Results  Labs (all labs ordered are listed, but only abnormal results are displayed) Labs Reviewed  COMPREHENSIVE METABOLIC PANEL - Abnormal; Notable for the following components:      Result Value   Glucose, Bld 101 (*)    Calcium 8.6 (*)    Total Bilirubin 0.2 (*)    All other components within normal limits  CBC WITH DIFFERENTIAL/PLATELET    EKG None  Radiology Ct Soft Tissue Neck W Contrast  Result Date: 01/03/2019 CLINICAL DATA:  Airway pressure for 1 week. EXAM: CT NECK WITH CONTRAST TECHNIQUE: Multidetector CT imaging of the neck was performed using the standard protocol following the bolus administration of intravenous contrast. CONTRAST:  33mL OMNIPAQUE IOHEXOL 300 MG/ML  SOLN COMPARISON:  None. FINDINGS: PHARYNX AND LARYNX: Normal.  Widely patent airway. SALIVARY GLANDS: Normal. THYROID: Normal. LYMPH NODES: Greater than expected number of non pathologically enlarged cervical lymph nodes present. VASCULAR: Retropharyngeal internal carotid arteries can be transient. LIMITED INTRACRANIAL: Normal. VISUALIZED ORBITS: Normal. MASTOIDS AND VISUALIZED PARANASAL SINUSES: Trace RIGHT maxillary mucosal thickening without paranasal sinus air-fluid levels. SKELETON: Nonacute. UPPER CHEST: Lung apices are clear. No superior mediastinal lymphadenopathy. OTHER: None. IMPRESSION: 1. No acute process in the neck; patent airway. 2. Small cervical lymph nodes may be reactive. Electronically Signed   By: Awilda Metro M.D.   On: 01/03/2019 23:01    Procedures Procedures (including critical care time)  Medications Ordered in ED Medications  iohexol (OMNIPAQUE) 300 MG/ML solution 75 mL (75 mLs Intravenous Contrast Given 01/03/19 2221)     Initial Impression / Assessment and Plan / ED Course  I have reviewed the triage vital signs and the nursing notes.  Pertinent  labs & imaging results that were available during my care of the patient were reviewed by me and considered in my medical decision making (see chart for details).        Labs and CT neck unremarkable.  Patient was reassured that she does not have any masses in her neck preventing her from taking deep breath.  Patient will follow back up with her primary care doctor  Final Clinical Impressions(s) / ED Diagnoses   Final diagnoses:  Neck ache    ED Discharge Orders    None       Bethann Berkshire, MD 01/03/19 2315

## 2019-01-03 NOTE — ED Triage Notes (Signed)
Pt C/O SOB that began on Sunday. Pt states when she takes a deep breath "it feels like I can't get enough air. It feels like something is blocking my airway." Pt has no noted gargled or muffled speech.

## 2019-01-03 NOTE — Discharge Instructions (Addendum)
Follow-up with your family doctor next week if not improving or getting worse

## 2019-02-05 ENCOUNTER — Telehealth: Payer: Self-pay | Admitting: Orthopedic Surgery

## 2019-02-05 NOTE — Telephone Encounter (Signed)
Sounds like a need for in person  shes 38  Its not acute  So I d say 4 weeks will be fine

## 2019-02-05 NOTE — Telephone Encounter (Signed)
Patient wants an appointment for a new problem. She was last seen by you in 04/2018 for L Ankle Pain. She was to return and never did, now she is having problems with her R Foot. Do you recommend for her to see PCP first, virtual or a in person appointment with you.  Please advise

## 2019-02-13 ENCOUNTER — Other Ambulatory Visit (HOSPITAL_COMMUNITY): Payer: Self-pay | Admitting: Nurse Practitioner

## 2019-02-13 ENCOUNTER — Other Ambulatory Visit: Payer: Self-pay | Admitting: Nurse Practitioner

## 2019-02-13 ENCOUNTER — Ambulatory Visit (HOSPITAL_COMMUNITY)
Admission: RE | Admit: 2019-02-13 | Discharge: 2019-02-13 | Disposition: A | Discharge status: Home / Self Care | Payer: BLUE CROSS/BLUE SHIELD | Source: Ambulatory Visit | Attending: Nurse Practitioner | Admitting: Nurse Practitioner

## 2019-02-13 ENCOUNTER — Other Ambulatory Visit: Payer: Self-pay

## 2019-02-13 DIAGNOSIS — R109 Unspecified abdominal pain: Secondary | ICD-10-CM | POA: Diagnosis not present

## 2019-02-13 MED ORDER — IOHEXOL 300 MG/ML  SOLN
30.0000 mL | Freq: Once | INTRAMUSCULAR | Status: AC | PRN
  Administered 2019-02-13: 30 mL via ORAL

## 2019-02-13 MED ORDER — IOHEXOL 300 MG/ML  SOLN
100.0000 mL | Freq: Once | INTRAMUSCULAR | Status: AC | PRN
  Administered 2019-02-13: 100 mL via INTRAVENOUS

## 2019-02-27 ENCOUNTER — Encounter: Payer: Self-pay | Admitting: Internal Medicine

## 2019-03-04 ENCOUNTER — Other Ambulatory Visit: Payer: Self-pay

## 2019-03-04 ENCOUNTER — Ambulatory Visit (INDEPENDENT_AMBULATORY_CARE_PROVIDER_SITE_OTHER): Payer: BLUE CROSS/BLUE SHIELD | Admitting: Obstetrics and Gynecology

## 2019-03-04 ENCOUNTER — Telehealth: Payer: Self-pay | Admitting: Advanced Practice Midwife

## 2019-03-04 ENCOUNTER — Encounter: Payer: Self-pay | Admitting: Obstetrics and Gynecology

## 2019-03-04 DIAGNOSIS — L02429 Furuncle of limb, unspecified: Secondary | ICD-10-CM | POA: Diagnosis not present

## 2019-03-04 MED ORDER — CEPHALEXIN 500 MG PO CAPS: 500.0000 mg | ORAL_CAPSULE | Freq: Three times a day (TID) | ORAL | 0 refills | Status: DC

## 2019-03-04 NOTE — Telephone Encounter (Signed)
Patient called, stated she has a boil on the inner side of her thigh.  She would like an appointment, please advise.  [phone]

## 2019-03-04 NOTE — Patient Instructions (Signed)

## 2019-03-04 NOTE — Telephone Encounter (Signed)
Scheduled for today at 1:45pm w/Dr. Alysia Penna.

## 2019-03-04 NOTE — Progress Notes (Signed)
Kristin [name] presents with c/o boil on her upper right thigh. Noticed about a week ago but did not become painful until yesterday. No fever or drainage  PE AF VSS Lungs clear Heart RRR Abd soft + BS Ext Right upper thigh boil 1 in by 1 in Cleaned with Betadine. Hurricane spray applied Swab incision with # 11 Blade made  Purulent discharge expressed Pt tolerated well  A/P Boil  Wound care reviewed with pt. Will start Keflex x 7 days F/U PRN

## 2019-03-06 ENCOUNTER — Encounter: Payer: Self-pay | Admitting: Orthopedic Surgery

## 2019-03-06 ENCOUNTER — Ambulatory Visit: Payer: BLUE CROSS/BLUE SHIELD | Admitting: Orthopedic Surgery

## 2019-03-06 ENCOUNTER — Ambulatory Visit (INDEPENDENT_AMBULATORY_CARE_PROVIDER_SITE_OTHER): Payer: BLUE CROSS/BLUE SHIELD

## 2019-03-06 ENCOUNTER — Other Ambulatory Visit: Payer: Self-pay

## 2019-03-06 VITALS — BP 114/73 | HR 76 | Temp 97.9°F | Ht 64.0 in | Wt 284.0 lb

## 2019-03-06 DIAGNOSIS — M79671 Pain in right foot: Secondary | ICD-10-CM | POA: Diagnosis not present

## 2019-03-06 DIAGNOSIS — M722 Plantar fascial fibromatosis: Secondary | ICD-10-CM | POA: Diagnosis not present

## 2019-03-06 DIAGNOSIS — Z6841 Body Mass Index (BMI) 40.0 and over, adult: Secondary | ICD-10-CM | POA: Diagnosis not present

## 2019-03-06 NOTE — Progress Notes (Signed)
NEW PROBLEM OFFICE VISIT  Chief Complaint  Patient presents with  . Foot Pain    Rt heel for 1 month NKI.    38 year old female several week history of pain plantar aspect right foot.  She describes restart pain in the morning and after sitting at work.  Pain is located on the plantar aspect right heel timing intermittent seems to be activity related after quiesced sent.  And then restarting walking no prior treatment no swelling no numbness but she does relate some pain on the lateral border of the foot     Review of Systems  Gastrointestinal: Positive for heartburn.  All other systems reviewed and are negative.    Past Medical History:  Diagnosis Date  . Allergy   . Carpal tunnel syndrome of right wrist 10/22/2018  . Contraceptive management 11/05/2013  . Depression   . GERD (gastroesophageal reflux disease)   . IUD (intrauterine device) in place 01/13/2016  . Migraines   . Missed periods 01/13/2016  . Vaginal Pap smear, abnormal     Past Surgical History:  Procedure Laterality Date  . NO PAST SURGERIES      Family History  Problem Relation Age of Onset  . Other Mother        enlarged heart  . Diabetes Mother   . Hypertension Mother   . Hyperlipidemia Mother   . Heart disease Mother        heart murmer  . Miscarriages / [place] Mother   . Hypertension Father   . Hyperlipidemia Father   . Cancer Father        prostate  . Other Brother        enlarged heart; colloid cyst of the third ventricle( of the brain)  . Cancer Paternal Grandmother        leukemia  . Cancer Paternal Grandfather        lung  . Hypertension Sister    Social History   Tobacco Use  . Smoking status: Never Smoker  . Smokeless tobacco: Never Used  Substance Use Topics  . Alcohol use: Yes    Comment: occasional  . Drug use: No    No Known Allergies  Current Meds  Medication Sig  . cephALEXin (KEFLEX) 500 MG capsule Take 1 capsule (500 mg total) by mouth 3 (three) times daily.   . cetirizine (ZYRTEC) 10 MG tablet Take 10 mg by mouth daily.   . fluticasone (FLONASE) 50 MCG/ACT nasal spray Place 2 sprays into both nostrils daily.   [name] levonorgestrel (MIRENA) 20 MCG/24HR IUD 1 each by Intrauterine route once.  . rizatriptan (MAXALT) 10 MG tablet TAKE 1 TABLET BY MOUTH ONCE AT ONSET OF SYMPTOMS, MAY REPEAT DOSE IN 2 HOURS IF NEEDED. (Patient taking differently: Take 10 mg by mouth See admin instructions. TAKE 1 TABLET BY MOUTH ONCE AT ONSET OF SYMPTOMS, MAY REPEAT DOSE IN 2 HOURS IF NEEDED.)    BP 114/73   Pulse 76   Temp 97.9 F (36.6 C)   Ht 5\' 4"  (1.626 m)   Wt 284 lb (128.8 kg)   BMI 48.75 kg/m   Physical Exam Vitals signs and nursing note reviewed.  Constitutional:      Appearance: Normal appearance. She is obese.  Neurological:     Mental Status: She is alert and oriented to person, place, and time.  Psychiatric:        Mood and Affect: Mood normal.     Ortho Exam  Right foot Low arch on  the right foot compared to the left which is completely flat Tenderness in the plantar aspect of the heel nontender in the calcaneus Right to left dorsiflexion equal and normal Right subtalar joint motion normal anterior drawer test negative Plantar flexion dorsiflexion strength normal Neurovascular exam is intact Skin normal as well  MEDICAL DECISION SECTION  [facility]  My independent reading of xrays:   Small to medium size spur plantar aspect of the calcaneus large anterior process of the calcaneus calcification in the Achilles otherwise normal alignment of the foot   The patient meets the AMA guidelines for Morbid (severe) obesity with a BMI > 40.0 and I have recommended weight loss. Encounter Diagnoses  Name Primary?  . Right foot pain   . Plantar fasciitis-right Yes  . Body mass index 45.0-49.9, adult (HCC)   . Morbid obesity (HCC)     PLAN: (Rx., injectx, surgery, frx, mri/ct) Tulis Stretching Cryotherapy Ibuprofen  800 mg bid (h/o gerd) x 3 weeks  FU 6 WEEKS   No orders of the defined types were placed in this encounter.   Kristin CanadaStanley Haniah Penny, MD  03/06/2019 11:36 AM

## 2019-03-06 NOTE — Patient Instructions (Addendum)
Tuli heel pads  Stretching as indicated 3 x a day  Cryotherapy//frozen water bottle 3 x a day 20 min Ibuprofen 800 mg bid x 3 weeks  Plantar Fasciitis  Plantar fasciitis is a painful foot condition that affects the heel. It occurs when the band of tissue that connects the toes to the heel bone (plantar fascia) becomes irritated. This can happen as the result of exercising too much or doing other repetitive activities (overuse injury). The pain from plantar fasciitis can range from mild irritation to severe pain that makes it difficult to walk or move. The pain is usually worse in the morning after sleeping, or after sitting or lying down for a while. Pain may also be worse after long periods of walking or standing. What are the causes? This condition may be caused by:  Standing for long periods of time.  Wearing shoes that do not have good arch support.  Doing activities that put stress on joints (high-impact activities), including running, aerobics, and ballet.  Being overweight.  An abnormal way of walking (gait).  Tight muscles in the back of your lower leg (calf).  High arches in your feet.  Starting a new athletic activity. What are the signs or symptoms? The main symptom of this condition is heel pain. Pain may:  Be worse with first steps after a time of rest, especially in the morning after sleeping or after you have been sitting or lying down for a while.  Be worse after long periods of standing still.  Decrease after 30-45 minutes of activity, such as gentle walking. How is this diagnosed? This condition may be diagnosed based on your medical history and your symptoms. Your health care provider may ask questions about your activity level. Your health care provider will do a physical exam to check for:  A tender area on the bottom of your foot.  A high arch in your foot.  Pain when you move your foot.  Difficulty moving your foot. You may have imaging tests to  confirm the diagnosis, such as:  X-rays.  Ultrasound.  MRI. How is this treated? Treatment for plantar fasciitis depends on how severe your condition is. Treatment may include:  Rest, ice, applying pressure (compression), and raising the affected foot (elevation). This may be called RICE therapy. Your health care provider may recommend RICE therapy along with over-the-counter pain medicines to manage your pain.  Exercises to stretch your calves and your plantar fascia.  A splint that holds your foot in a stretched, upward position while you sleep (night splint).  Physical therapy to relieve symptoms and prevent problems in the future.  Injections of steroid medicine (cortisone) to relieve pain and inflammation.  Stimulating your plantar fascia with electrical impulses (extracorporeal shock wave therapy). This is usually the last treatment option before surgery.  Surgery, if other treatments have not worked after 12 months. Follow these instructions at home:  Managing pain, stiffness, and swelling  If directed, put ice on the painful area: ? Put ice in a plastic bag, or use a frozen bottle of water. ? Place a towel between your skin and the bag or bottle. ? Roll the bottom of your foot over the bag or bottle. ? Do this for 20 minutes, 2-3 times a day.  Wear athletic shoes that have air-sole or gel-sole cushions, or try wearing soft shoe inserts that are designed for plantar fasciitis.  Raise (elevate) your foot above the level of your heart while you are sitting or  lying down. Activity  Avoid activities that cause pain. Ask your health care provider what activities are safe for you.  Do physical therapy exercises and stretches as told by your health care provider.  Try activities and forms of exercise that are easier on your joints (low-impact). Examples include swimming, water aerobics, and biking. General instructions  Take over-the-counter and prescription medicines  only as told by your health care provider.  Wear a night splint while sleeping, if told by your health care provider. Loosen the splint if your toes tingle, become numb, or turn cold and blue.  Maintain a healthy weight, or work with your health care provider to lose weight as needed.  Keep all follow-up visits as told by your health care provider. This is important. Contact a health care provider if you:  Have symptoms that do not go away after caring for yourself at home.  Have pain that gets worse.  Have pain that affects your ability to move or do your daily activities. Summary  Plantar fasciitis is a painful foot condition that affects the heel. It occurs when the band of tissue that connects the toes to the heel bone (plantar fascia) becomes irritated.  The main symptom of this condition is heel pain that may be worse after exercising too much or standing still for a long time.  Treatment varies, but it usually starts with rest, ice, compression, and elevation (RICE therapy) and over-the-counter medicines to manage pain. This information is not intended to replace advice given to you by your health care provider. Make sure you discuss any questions you have with your health care provider. Document Released: 06/14/2001 Document Revised: 07/17/2017 Document Reviewed: 07/17/2017 Elsevier Interactive Patient Education  2019 [employer] Inc[employer]  Obesity, Adult Obesity is the condition of having too much total body fat. Being overweight or obese means that your weight is greater than what is considered healthy for your body size. Obesity is determined by a measurement called BMI. BMI is an estimate of body fat and is calculated from height and weight. For adults, a BMI of 30 or higher is considered obese. Obesity can eventually lead to other health concerns and major illnesses, including:  Stroke.  Coronary artery disease (CAD).  Type 2 diabetes.  Some types of cancer, including cancers  of the colon, breast, uterus, and gallbladder.  Osteoarthritis.  High blood pressure (hypertension).  High cholesterol.  Sleep apnea.  Gallbladder stones.  Infertility problems. What are the causes? The main cause of obesity is taking in (consuming) more calories than your body uses for energy. Other factors that contribute to this condition may include:  Being born with genes that make you more likely to become obese.  Having a medical condition that causes obesity. These conditions include: ? Hypothyroidism. ? Polycystic ovarian syndrome (PCOS). ? Binge-eating disorder. ? Cushing syndrome.  Taking certain medicines, such as steroids, antidepressants, and seizure medicines.  Not being physically active (sedentary lifestyle).  Living where there are limited places to exercise safely or buy healthy foods.  Not getting enough sleep. What increases the risk? The following factors may increase your risk of this condition:  Having a family history of obesity.  Being a woman of African-American descent.  Being a man of Hispanic descent. What are the signs or symptoms? Having excessive body fat is the main symptom of this condition. How is this diagnosed? This condition may be diagnosed based on:  Your symptoms.  Your medical history.  A physical exam. Your health  care provider may measure: ? Your BMI. If you are an adult with a BMI between 25 and less than 30, you are considered overweight. If you are an adult with a BMI of 30 or higher, you are considered obese. ? The distances around your hips and your waist (circumferences). These may be compared to each other to help diagnose your condition. ? Your skinfold thickness. Your health care provider may gently pinch a fold of your skin and measure it. How is this treated? Treatment for this condition often includes changing your lifestyle. Treatment may include some or all of the following:  Dietary changes. Work with  your health care provider and a dietitian to set a weight-loss goal that is healthy and reasonable for you. Dietary changes may include eating: ? Smaller portions. A portion size is the amount of a particular food that is healthy for you to eat at one time. This varies from person to person. ? Low-calorie or low-fat options. ? More whole grains, fruits, and vegetables.  Regular physical activity. This may include aerobic activity (cardio) and strength training.  Medicine to help you lose weight. Your health care provider may prescribe medicine if you are unable to lose 1 pound a week after 6 weeks of eating more healthily and doing more physical activity.  Surgery. Surgical options may include gastric banding and gastric bypass. Surgery may be done if: ? Other treatments have not helped to improve your condition. ? You have a BMI of 40 or higher. ? You have life-threatening health problems related to obesity. Follow these instructions at home:  Eating and drinking   Follow recommendations from your health care provider about what you eat and drink. Your health care provider may advise you to: ? Limit fast foods, sweets, and processed snack foods. ? Choose low-fat options, such as low-fat milk instead of whole milk. ? Eat 5 or more servings of fruits or vegetables every day. ? Eat at home more often. This gives you more control over what you eat. ? Choose healthy foods when you eat out. ? Learn what a healthy portion size is. ? Keep low-fat snacks on hand. ? Avoid sugary drinks, such as soda, fruit juice, iced tea sweetened with sugar, and flavored milk. ? Eat a healthy breakfast.  Drink enough water to keep your urine clear or pale yellow.  Do not go without eating for long periods of time (do not fast) or follow a fad diet. Fasting and fad diets can be unhealthy and even dangerous. Physical Activity  Exercise regularly, as told by your health care provider. Ask your health care  provider what types of exercise are safe for you and how often you should exercise.  Warm up and stretch before being active.  Cool down and stretch after being active.  Rest between periods of activity. Lifestyle  Limit the time that you spend in front of your TV, computer, or video game system.  Find ways to reward yourself that do not involve food.  Limit alcohol intake to no more than 1 drink a day for nonpregnant women and 2 drinks a day for men. One drink equals 12 oz of beer, 5 oz of wine, or 1 oz of hard liquor. General instructions  Keep a weight loss journal to keep track of the food you eat and how much you exercise you get.  Take over-the-counter and prescription medicines only as told by your health care provider.  Take vitamins and supplements only as told  by your health care provider.  Consider joining a support group. Your health care provider may be able to recommend a support group.  Keep all follow-up visits as told by your health care provider. This is important. Contact a health care provider if:  You are unable to meet your weight loss goal after 6 weeks of dietary and lifestyle changes. This information is not intended to replace advice given to you by your health care provider. Make sure you discuss any questions you have with your health care provider. Document Released: 10/27/2004 Document Revised: 02/22/2016 Document Reviewed: 07/08/2015 Elsevier Interactive Patient Education  2019 [employer].

## 2019-03-18 ENCOUNTER — Other Ambulatory Visit: Payer: Self-pay | Admitting: *Deleted

## 2019-03-18 ENCOUNTER — Other Ambulatory Visit: Payer: Self-pay

## 2019-03-18 ENCOUNTER — Ambulatory Visit (INDEPENDENT_AMBULATORY_CARE_PROVIDER_SITE_OTHER): Payer: BLUE CROSS/BLUE SHIELD | Admitting: Nurse Practitioner

## 2019-03-18 ENCOUNTER — Encounter: Payer: Self-pay | Admitting: Nurse Practitioner

## 2019-03-18 ENCOUNTER — Encounter: Payer: Self-pay | Admitting: *Deleted

## 2019-03-18 DIAGNOSIS — R935 Abnormal findings on diagnostic imaging of other abdominal regions, including retroperitoneum: Secondary | ICD-10-CM | POA: Diagnosis not present

## 2019-03-18 DIAGNOSIS — R194 Change in bowel habit: Secondary | ICD-10-CM | POA: Diagnosis not present

## 2019-03-18 DIAGNOSIS — K219 Gastro-esophageal reflux disease without esophagitis: Secondary | ICD-10-CM

## 2019-03-18 MED ORDER — OMEPRAZOLE 20 MG PO CPDR: 20.0000 mg | DELAYED_RELEASE_CAPSULE | Freq: Two times a day (BID) | ORAL | 3 refills | Status: DC

## 2019-03-18 MED ORDER — PEG 3350-KCL-NA BICARB-NACL 420 G PO SOLR: 4000.0000 mL | Freq: Once | ORAL | 0 refills | Status: AC

## 2019-03-18 NOTE — Patient Instructions (Signed)
Your health issues we discussed today were:   GERD (reflux/heartburn) and abnormal CT of your esophagus: 1. I have increased her Prilosec to 20 mg twice daily.  Take this about 20 to 30 minutes before your first meal the day and your last meal the day 2. We will schedule an upper endoscopy to further evaluate your swallowing tube, stomach, first part of your small intestines 3. Further recommendations to follow your upper endoscopy  Change in stool habits, constipation, abnormal CT: 1. Given your change in stool habits to constipation, some of which is ongoing, as well as the abnormal CT as we discussed we will proceed with a colonoscopy to further evaluate 2. Further recommendations will be made after your colonoscopy  Overall I recommend:  1. Continue your other medications otherwise 2. Return for follow-up in 4 months 3. Call us if you have any questions or concerns   Because of recent events of COVID-19 ("Coronavirus"), follow CDC recommendations:  1. Wash your hand frequently 2. Avoid touching your face 3. Stay away from people who are sick 4. If you have symptoms such as fever, cough, shortness of breath then call your healthcare provider for further guidance 5. If you are sick, STAY AT HOME unless otherwise directed by your healthcare provider. 6. Follow directions from state and national officials regarding staying safe   At [facility] we value your feedback. You may receive a survey about your visit today. Please share your experience as we strive to create trusting relationships with our patients to provide genuine, compassionate, quality care.  We appreciate your understanding and patience as we review any laboratory studies, imaging, and other diagnostic tests that are ordered as we care for you. Our office policy is 5 business days for review of these results, and any emergent or urgent results are addressed in a timely manner for your best interest. If you  do not hear from our office in 1 week, please contact us.   We also encourage the use of MyChart, which contains your medical information for your review as well. If you are not enrolled in this feature, an access code is on this after visit summary for your convenience. Thank you for allowing [place] to be involved in your care.  It was great to see you today!  I hope you have a great summer!!

## 2019-03-18 NOTE — Assessment & Plan Note (Signed)
The patient was having significant GERD symptoms including chest pain.  Also describes globus sensation, sensation that she needs to "burp in order to feel better" and was previously having salty dysphagia, although she has not any further.  She was started on Prilosec 20 mg daily by the emergency department.  This is helped somewhat.  I will increase her dose to 20 mg twice daily.  Continue other current medications.  Further evaluation as per above.

## 2019-03-18 NOTE — Progress Notes (Signed)
cc'ed to pcp °

## 2019-03-18 NOTE — Assessment & Plan Note (Signed)
Abnormal CT of the abdomen as reviewed in HPI.  Specifically there is a soft tissue density near the GE junction and gaseous distention of the distal esophagus.  Splenic flexure and descending colon abnormalities as well.  Given her persistent GERD symptoms despite PPI and abnormal CT we will proceed with an upper endoscopy to further evaluate.  She was previously having solid food dysphagia, but is not any further.  I do not feel she would likely need a dilation at this time.  Proceed with EGD with Dr. Gala Romney in near future: the risks, benefits, and alternatives have been discussed with the patient in detail. The patient states understanding and desires to proceed.  The patient is not on any anticoagulants, anxiolytics, chronic pain medications, or antidepressants.  Social/rare alcohol use.  Denies recreational drug use.  Conscious sedation should be adequate for her procedure.

## 2019-03-18 NOTE — Progress Notes (Signed)
Primary Care Physician:  Erasmo DownerStrader, Lindsey F, NP Primary Gastroenterologist:  Dr. Jena Gaussourk  Chief Complaint  Patient presents with   Constipation    Started having "decent" BM mid may   abnormal CT of esophagus    HPI:   Kristin [name] is a 38 y.o. female who presents on referral from primary care for constipation and "swelling of the esophagus."  Reviewed information provided with referral including office visit dated 02/14/2019.  Which requested an ASAP referral due to constipation and "swelling of the esophagus" on CT.  Reviewed CT of the abdomen and pelvis dated 02/13/2019.  No definite acute intra-abdominal abnormality detected.  There is a soft tissue density near the GE junction and gaseous distention of the distal esophagus with remaining portions of the stomach unremarkable.  No evidence of diverticulitis or colitis.  However, oral contrast was seen to the level of the splenic flexure with the descending colon and splenic flexure decompressed and poorly evaluated.  Infectious or inflammatory colitis difficult to exclude and recommended correlation with history and physical exam.  Additionally the patient was seen in the emergency department 12/30/2018 for nonspecific chest pain.  After full evaluation troponins and d-dimer were normal, EKG essentially normal.  Her chest pain was improved with Pepcid and she was given a prescription for Prilosec.  No history of colonoscopy or endoscopy in our system.  Today she states she's been having some issues with her GI system. Was having hard stools, difficult to pass. Started on MiraLAX which minimally helped so Dulcolax was added to this. Finally added a fleet enema which made no difference. She took MgCitrate which finally helped. Things got better. Currently only on Benefiber only. Has a bowel movement daily, states is it soft and passes easily; points to [place] 5 [place] 6 [facility]. Takes Benefiber 3 times a day. She started having  GERD symptoms about 4 months ago. Symptoms include globus sensation "like there was something in my throat and my throat was very tight." She was started on Prilosec, which she states is working but still with a globus sensation. Was having solid food dysphagia prior to her CT, but not any more since. Still with GERD symptoms about daily. Symptoms described as "uncomfortable, feels like I need to make myself burp a lot to feel better." Denies abdominal pain, N/V, fever, chills. Was previously losing weight due to "impacted bowel" (constipation?) and poor appetite; appetite has since improved and no further weight loss. Denies URI or flu-like symptoms. Denies loss of sense of taste or smell. Denies chest pain, dyspnea, dizziness, lightheadedness, syncope, near syncope. Denies any other upper or lower GI symptoms.  Past Medical History:  Diagnosis Date   Allergy    Carpal tunnel syndrome of right wrist 10/22/2018   Contraceptive management 11/05/2013   Depression    GERD (gastroesophageal reflux disease)    IUD (intrauterine device) in place 01/13/2016   Migraines    Missed periods 01/13/2016   Vaginal Pap smear, abnormal     Past Surgical History:  Procedure Laterality Date   NO PAST SURGERIES      Current Outpatient Medications  Medication Sig Dispense Refill   fluticasone (FLONASE) 50 MCG/ACT nasal spray Place 2 sprays into both nostrils daily.      levonorgestrel (MIRENA) 20 MCG/24HR IUD 1 each by Intrauterine route once.     rizatriptan (MAXALT) 10 MG tablet TAKE 1 TABLET BY MOUTH ONCE AT ONSET OF SYMPTOMS, MAY REPEAT DOSE IN 2 HOURS  IF NEEDED. (Patient taking differently: Take 10 mg by mouth See admin instructions. TAKE 1 TABLET BY MOUTH ONCE AT ONSET OF SYMPTOMS, MAY REPEAT DOSE IN 2 HOURS IF NEEDED.) 9 tablet 0   Wheat Dextrin (BENEFIBER PO) Take by mouth. Once daily     No current facility-administered medications for this visit.     Allergies as of 03/18/2019   (No  Known Allergies)    Family History  Problem Relation Age of Onset   Other Mother        enlarged heart   Diabetes Mother    Hypertension Mother    Hyperlipidemia Mother    Heart disease Mother        heart murmer   Miscarriages / Stillbirths Mother    Hypertension Father    Hyperlipidemia Father    Cancer Father        prostate   Other Brother        enlarged heart; colloid cyst of the third ventricle( of the brain)   Cancer Paternal Grandmother        leukemia   Cancer Paternal Grandfather        lung   Hypertension Sister    Colon cancer Maternal Grandmother    Colon cancer Maternal Aunt     Social History   Socioeconomic History   Marital status: Divorced    Spouse name: Not on file   Number of children: 3   Years of education: 14   Highest education level: Not on file  Occupational History   Occupation: Administrator    Comment: [employer]: Not on file   Food insecurity    Worry: Not on file    Inability: Not on file   Transportation needs    Medical: Not on file    Non-medical: Not on file  Tobacco Use   Smoking status: Never Smoker   Smokeless tobacco: Never Used  Substance and Sexual Activity   Alcohol use: Yes    Comment: occasional   Drug use: No   Sexual activity: Not Currently    Birth control/protection: I.U.D.  Lifestyle   Physical activity    Days per week: Not on file    Minutes per session: Not on file   Stress: Not on file  Relationships   Social connections    Talks on phone: Not on file    Gets together: Not on file    Attends religious service: Not on file    Active member of club or organization: Not on file    Attends meetings of clubs or organizations: Not on file    Relationship status: Not on file   Intimate partner violence    Fear of current or ex partner: Not on file    Emotionally abused: Not on file    Physically abused: Not on file     Forced sexual activity: Not on file  Other Topics Concern   Not on file  Social History Narrative   Lives with parents   Looking for own place   Tries to walk daily    Review of Systems: General: Negative for anorexia, weight loss, fever, chills, fatigue, weakness. ENT: Negative for hoarseness, difficulty swallowing. CV: Negative for chest pain, angina, palpitations, peripheral edema.  Respiratory: Negative for dyspnea at rest, cough, sputum, wheezing.  GI: See history of present illness. MS: Negative for joint pain, low back pain.  Derm: Negative for rash or itching.  Endo: Negative for unusual weight change.  Heme: Negative for bruising or bleeding. Allergy: Negative for rash or hives.    Physical Exam: BP 128/74    Pulse 64    Temp 97.8 F (36.6 C) (Oral)    Ht 5\' 4"  (1.626 m)    Wt 283 lb 12.8 oz (128.7 kg)    BMI 48.71 kg/m  General:   Alert and oriented. Pleasant and cooperative. Well-nourished and well-developed.  Head:  Normocephalic and atraumatic. Eyes:  Without icterus, sclera clear and conjunctiva pink.  Ears:  Normal auditory acuity. Cardiovascular:  S1, S2 present without murmurs appreciated. Extremities without clubbing or edema. Respiratory:  Clear to auscultation bilaterally. No wheezes, rales, or rhonchi. No distress.  Gastrointestinal:  +BS, soft, non-tender and non-distended. No HSM noted. No guarding or rebound. No masses appreciated.  Rectal:  Deferred  Musculoskalatal:  Symmetrical without gross deformities.  Skin:  Intact without significant lesions or rashes. Neurologic:  Alert and oriented x4;  grossly normal neurologically. Psych:  Alert and cooperative. Normal mood and affect. Heme/Lymph/Immune: No excessive bruising noted.    03/18/2019 11:54 AM   Disclaimer: This note was dictated with voice recognition software. Similar sounding words can inadvertently be transcribed and may not be corrected upon review.

## 2019-03-18 NOTE — Assessment & Plan Note (Signed)
The patient describes a significant change in her stool habits.  She was previously very regular.  However, she recently started having worsening constipation.  She relates this as a "stool obstruction" although there is no evidence that she had a bowel obstruction.  She tried multiple over-the-counter regimens.  She is currently on Benefiber 3 times a day.  She does have softer stools but does not feel like she completely empties.  She is satisfied with the result for now.  CT of her abdomen and pelvis was a little abnormal and unable to rule out inflammatory versus infectious colitis due to descending colon decompression.  Given the abnormal CT and change in stool habits I feel it is best to proceed with a colonoscopy at this time to further evaluate for any significant etiologies.  Return for follow-up in 4 months.  Proceed with TCS with Dr. Gala Romney in near future: the risks, benefits, and alternatives have been discussed with the patient in detail. The patient states understanding and desires to proceed.  The patient is not on any anticoagulants, anxiolytics, chronic pain medications, or antidepressants.  She drinks rarely/socially.  Denies drug use.  Conscious sedation should be adequate for her procedure.

## 2019-04-04 ENCOUNTER — Telehealth: Payer: Self-pay

## 2019-04-04 NOTE — Telephone Encounter (Signed)
Tried to call pt to schedule COVID test prior to TCS/EGD 05/10/19, no answer, LMOVM for return call.

## 2019-04-04 NOTE — Telephone Encounter (Signed)
Pt called office, COVID test scheduled for 05/08/19 at 3:25pm. Pt is aware to quarantine at home after test until procedure. Appt letter mailed.

## 2019-04-12 ENCOUNTER — Telehealth: Payer: Self-pay | Admitting: Internal Medicine

## 2019-04-12 DIAGNOSIS — K219 Gastro-esophageal reflux disease without esophagitis: Secondary | ICD-10-CM

## 2019-04-12 NOTE — Telephone Encounter (Signed)
Pt wants to know if she can take omeprazole all at once instead of twice a day. Please advise [phone]

## 2019-04-12 NOTE — Telephone Encounter (Signed)
Spoke with pt. She isn't noticing much change with her Jerrye Bushy when taking Omeprazole 20 mg once daily. She has taken 2 Omeprzoles 20 mg capsules at once and notices it works better taking the 40 mg instead of the 20 mg. Please advise on pts medication.

## 2019-04-12 NOTE — Telephone Encounter (Signed)
Yes, ok to take 40 mg at once. If she runs out early we can send an updated Rx to her pharmacy. Call us in a couple weeks and let us know how she's doing. Can call for any worsening or severe symptoms.

## 2019-04-15 ENCOUNTER — Telehealth: Payer: Self-pay | Admitting: Advanced Practice Midwife

## 2019-04-15 ENCOUNTER — Telehealth: Payer: Self-pay | Admitting: *Deleted

## 2019-04-15 MED ORDER — PANTOPRAZOLE SODIUM 40 MG PO TBEC: 40.0000 mg | DELAYED_RELEASE_TABLET | Freq: Every day | ORAL | 3 refills | Status: DC

## 2019-04-15 NOTE — Telephone Encounter (Signed)
Pt states that she is cramping and not feeling right. She has a IUD in place. Requesting to be seen.

## 2019-04-15 NOTE — Telephone Encounter (Signed)
I will switch her to Protonix 40 mg daily. Take first thing in the morning on an empty stomach. Call in 3-4 weeks with how it's working; sooner if side effects.

## 2019-04-15 NOTE — Addendum Note (Signed)
Addended by: Gordy Levan, Saksham Akkerman A on: 04/15/2019 10:51 AM   Modules accepted: Orders

## 2019-04-15 NOTE — Telephone Encounter (Signed)
Spoke with pt. She started the Omeprazole 40 mg Saturday and says she feels miserable. Pt feels bloated, and almost nauseated. Pt would like to know if she can switch her medication since Omeprazole isn't working well? Please advise.

## 2019-04-15 NOTE — Telephone Encounter (Signed)
Pt notified of medication change.  

## 2019-04-15 NOTE — Telephone Encounter (Signed)
Patient states she is having some lower abdominal cramping.  Is having some pain with urination but has never had an UTI before so is unsure if that is what it could be.  Wants to see Manus Gunning but informed she does not have any openings this week.  Appt made with Anderson Malta.

## 2019-04-16 ENCOUNTER — Emergency Department (HOSPITAL_COMMUNITY)
Admission: EM | Admit: 2019-04-16 | Discharge: 2019-04-17 | Disposition: A | Discharge status: Home / Self Care | Payer: BLUE CROSS/BLUE SHIELD | Attending: Emergency Medicine | Admitting: Emergency Medicine

## 2019-04-16 ENCOUNTER — Other Ambulatory Visit: Payer: Self-pay

## 2019-04-16 DIAGNOSIS — K219 Gastro-esophageal reflux disease without esophagitis: Secondary | ICD-10-CM | POA: Insufficient documentation

## 2019-04-16 DIAGNOSIS — R55 Syncope and collapse: Secondary | ICD-10-CM | POA: Diagnosis not present

## 2019-04-16 DIAGNOSIS — Z79899 Other long term (current) drug therapy: Secondary | ICD-10-CM | POA: Diagnosis not present

## 2019-04-16 MED ORDER — SODIUM CHLORIDE 0.9 % IV BOLUS
1000.0000 mL | Freq: Once | INTRAVENOUS | Status: AC
  Administered 2019-04-17: 1000 mL via INTRAVENOUS

## 2019-04-16 NOTE — ED Triage Notes (Signed)
Pt states she took a new acid relflux medication this afternoon and soon after taking the medication she had a "hot" feeling come over her head and chest and she states she felt like she was having "palpitations"; pt states she called 911 and EMS came out to the house and took her BP and CBG 141; pt states she had a cardiac workup in March 2020 and everything came back normal

## 2019-04-16 NOTE — ED Provider Notes (Signed)
[facility] EMERGENCY DEPARTMENT Provider Note   CSN: [id number] Arrival date & time: 04/16/19  2018    History   Chief Complaint Chief Complaint  Patient presents with  . Near Syncope    HPI Kristin [name] is a 38 y.o. female.     Patient presents to the emergency department for evaluation of near syncope.  Patient reports that she had an episode tonight where she felt like her heart was racing and she felt hot all over.  She felt like she was going to pass out but did not.  She called 911 and EMS checked her blood pressure, blood sugar, etc. and everything was normal.  She also has had a recent cardiac work-up.  Patient reports that currently she has been under the care of Dr. Benard Rinkoark for GERD.  She has had multiple changes to her GERD medications because of persistent symptoms.  She took a new medication this afternoon just prior to onset of the symptoms.  No rash, allergic type symptoms noted.  Patient also notes that for the past week she has been intermittent fasting to weight lose weight.  She does not eat anything before 2 or 3 in the afternoon each day.  She has not been drinking much water either.     Past Medical History:  Diagnosis Date  . Allergy   . Carpal tunnel syndrome of right wrist 10/22/2018  . Contraceptive management 11/05/2013  . Depression   . GERD (gastroesophageal reflux disease)   . IUD (intrauterine device) in place 01/13/2016  . Migraines   . Missed periods 01/13/2016  . Vaginal Pap smear, abnormal     Patient Active Problem List   Diagnosis Date Noted  . GERD (gastroesophageal reflux disease) 03/18/2019  . Change in stool habits 03/18/2019  . Abnormal CT of the abdomen 03/18/2019  . Boil, leg 03/04/2019  . Screening for colorectal cancer 10/22/2018  . Screening for STDs (sexually transmitted diseases) 10/22/2018  . Encounter for gynecological examination with Papanicolaou smear of cervix 10/22/2018  . Family planning 10/22/2018  . Plantar  fasciitis, right 10/22/2018  . Carpal tunnel syndrome of right wrist 10/22/2018  . IUD (intrauterine device) in place 01/13/2016  . Migraines 03/07/2013    Past Surgical History:  Procedure Laterality Date  . NO PAST SURGERIES       OB History    Gravida  3   Para  3   Term  3   Preterm      AB      Living  3     SAB      TAB      Ectopic      Multiple  0   Live Births  3            Home Medications    Prior to Admission medications   Medication Sig Start Date End Date Taking? Authorizing Provider  fluticasone (FLONASE) 50 MCG/ACT nasal spray Place 2 sprays into both nostrils daily.  12/25/18   [provider]  levonorgestrel (MIRENA) 20 MCG/24HR IUD 1 each by Intrauterine route once.    [provider]  omeprazole (PRILOSEC) 20 MG capsule Take 1 capsule (20 mg total) by mouth 2 (two) times daily before a meal. 03/18/19   Anice PaganiniGill, Eric A, NP  pantoprazole (PROTONIX) 40 MG tablet Take 1 tablet (40 mg total) by mouth daily. 04/15/19   Anice PaganiniGill, Eric A, NP  rizatriptan (MAXALT) 10 MG tablet TAKE 1 TABLET BY MOUTH ONCE AT  ONSET OF SYMPTOMS, MAY REPEAT DOSE IN 2 HOURS IF NEEDED. Patient taking differently: Take 10 mg by mouth See admin instructions. TAKE 1 TABLET BY MOUTH ONCE AT ONSET OF SYMPTOMS, MAY REPEAT DOSE IN 2 HOURS IF NEEDED. 01/15/18   Eustace MooreNelson, Yvonne Sue, MD  Wheat Dextrin (BENEFIBER PO) Take by mouth. Once daily    [provider]    Family History Family History  Problem Relation Age of Onset  . Other Mother        enlarged heart  . Diabetes Mother   . Hypertension Mother   . Hyperlipidemia Mother   . Heart disease Mother        heart murmer  . Miscarriages / [place] Mother   . Hypertension Father   . Hyperlipidemia Father   . Cancer Father        prostate  . Other Brother        enlarged heart; colloid cyst of the third ventricle( of the brain)  . Cancer Paternal Grandmother        leukemia  . Cancer Paternal  Grandfather        lung  . Hypertension Sister   . Colon cancer Maternal Grandmother   . Colon cancer Maternal Aunt     Social History Social History   Tobacco Use  . Smoking status: Never Smoker  . Smokeless tobacco: Never Used  Substance Use Topics  . Alcohol use: Yes    Comment: occasional  . Drug use: No     Allergies   Patient has no known allergies.   Review of Systems Review of Systems  Constitutional: Positive for fatigue.  Cardiovascular: Positive for palpitations.  All other systems reviewed and are negative.    Physical Exam Updated Vital Signs BP 121/78 (BP Location: Right Arm)   Pulse 65   Temp 98.1 F (36.7 C) (Oral)   Resp 17   Ht 5\' 4"  (1.626 m)   Wt 124.7 kg   SpO2 100%   BMI 47.20 kg/m   Physical Exam Vitals signs and nursing note reviewed.  Constitutional:      General: She is not in acute distress.    Appearance: Normal appearance. She is well-developed.  HENT:     Head: Normocephalic and atraumatic.     Right Ear: Hearing normal.     Left Ear: Hearing normal.     Nose: Nose normal.  Eyes:     Conjunctiva/sclera: Conjunctivae normal.     Pupils: Pupils are equal, round, and reactive to light.  Neck:     Musculoskeletal: Normal range of motion and neck supple.  Cardiovascular:     Rate and Rhythm: Regular rhythm.     Heart sounds: S1 normal and S2 normal. No murmur. No friction rub. No gallop.   Pulmonary:     Effort: Pulmonary effort is normal. No respiratory distress.     Breath sounds: Normal breath sounds.  Chest:     Chest wall: No tenderness.  Abdominal:     General: Bowel sounds are normal.     Palpations: Abdomen is soft.     Tenderness: There is no abdominal tenderness. There is no guarding or rebound. Negative signs include Murphy's sign and McBurney's sign.     Hernia: No hernia is present.  Musculoskeletal: Normal range of motion.  Skin:    General: Skin is warm and dry.     Findings: No rash.  Neurological:      Mental Status: She is alert and  oriented to person, place, and time.     GCS: GCS eye subscore is 4. GCS verbal subscore is 5. GCS motor subscore is 6.     Cranial Nerves: No cranial nerve deficit.     Sensory: No sensory deficit.     Coordination: Coordination normal.  Psychiatric:        Speech: Speech normal.        Behavior: Behavior normal.        Thought Content: Thought content normal.      ED Treatments / Results  Labs (all labs ordered are listed, but only abnormal results are displayed) Labs Reviewed  BASIC METABOLIC PANEL - Abnormal; Notable for the following components:      Result Value   Potassium 3.4 (*)    All other components within normal limits  CBC    EKG EKG Interpretation  Date/Time:  Tuesday April 16 2019 23:44:14 EDT Ventricular Rate:  67 PR Interval:    QRS Duration: 98 QT Interval:  415 QTC Calculation: 439 R Axis:   72 Text Interpretation:  Sinus rhythm Normal ECG Confirmed by Orpah Greek [phone]) on 04/16/2019 11:50:13 PM   Radiology No results found.  Procedures Procedures (including critical care time)  Medications Ordered in ED Medications  potassium chloride SA (K-DUR) CR tablet 40 mEq (has no administration in time range)  sodium chloride 0.9 % bolus 1,000 mL (1,000 mLs Intravenous New Bag/Given 04/17/19 0005)     Initial Impression / Assessment and Plan / ED Course  I have reviewed the triage vital signs and the nursing notes.  Pertinent labs & imaging results that were available during my care of the patient were reviewed by me and considered in my medical decision making (see chart for details).       Patient presents to the emergency department with near syncope.  Symptoms are likely multifactorial.  Patient has been intermittent fasting which is likely the largest cause of her symptoms.  She is concerned because she recently started taking a new medication for her GERD, doubt that this was the cause of the  symptoms.  No signs of allergy.  Patient does admit that she has not been drinking much with her fasting, given IV fluids here in ER.  Vital signs have been unremarkable.  Recommend that she decrease the hours of fasting, continue her current medication regimen.  She has endoscopies scheduled with her gastroenterologist.  Final Clinical Impressions(s) / ED Diagnoses   Final diagnoses:  Near syncope    ED Discharge Orders    None       Siara Gorder, Gwenyth Allegra, MD 04/17/19 0109

## 2019-04-17 ENCOUNTER — Telehealth: Payer: Self-pay | Admitting: Internal Medicine

## 2019-04-17 ENCOUNTER — Ambulatory Visit: Payer: BLUE CROSS/BLUE SHIELD | Admitting: Orthopedic Surgery

## 2019-04-17 LAB — CBC
HCT: 40.7 % (ref 36.0–46.0)
Hemoglobin: 12.7 g/dL (ref 12.0–15.0)
MCH: 26.6 pg (ref 26.0–34.0)
MCHC: 31.2 g/dL (ref 30.0–36.0)
MCV: 85.3 fL (ref 80.0–100.0)
Platelets: 312 10*3/uL (ref 150–400)
RBC: 4.77 MIL/uL (ref 3.87–5.11)
RDW: 13.2 % (ref 11.5–15.5)
WBC: 8 10*3/uL (ref 4.0–10.5)
nRBC: 0 % (ref 0.0–0.2)

## 2019-04-17 LAB — BASIC METABOLIC PANEL
Anion gap: 9 (ref 5–15)
BUN: 7 mg/dL (ref 6–20)
CO2: 26 mmol/L (ref 22–32)
Calcium: 9.3 mg/dL (ref 8.9–10.3)
Chloride: 103 mmol/L (ref 98–111)
Creatinine, Ser: 0.63 mg/dL (ref 0.44–1.00)
GFR calc Af Amer: >60 mL/min (ref >60)
GFR calc non Af Amer: >60 mL/min (ref >60)
Glucose, Bld: 96 mg/dL (ref 70–99)
Potassium: 3.4 mmol/L — ABNORMAL LOW (ref 3.5–5.1)
Sodium: 138 mmol/L (ref 135–145)

## 2019-04-17 MED ORDER — POTASSIUM CHLORIDE CRYS ER 20 MEQ PO TBCR
40.0000 meq | EXTENDED_RELEASE_TABLET | Freq: Once | ORAL | Status: AC
  Administered 2019-04-17: 40 meq via ORAL
  Filled 2019-04-17: qty 2

## 2019-04-17 NOTE — Telephone Encounter (Signed)
Pt started Pantoprazole 40 mg yesterday after d/c Omeprazole. Pt didn't feel Omeprazole was working well. After she took her Pantoprazole 40 mg yesterday, she felt her heart racing and felt she was having a panic attack. Pt called EMS and went to [facility]. Her BP was low and pt states that she hasn't taken anything else new besides the Omeprazole. Pt mentioned she started fasting on Sunday mornings through 2 pm and isn't sure it that's what caused her symptoms. Pt had some burning sensations which is most likely due to the [place].

## 2019-04-17 NOTE — Telephone Encounter (Signed)
Left a detailed message for pt. Waiting on a return call.  

## 2019-04-17 NOTE — Telephone Encounter (Signed)
Pt has questions about her prescription. Please call [phone]

## 2019-04-17 NOTE — Telephone Encounter (Signed)
I looked over the specific information about Protonix and dont find a known side effect of hypotension, tachycardia, or panic attack. I'm wondering if it's from the medication or if it was coincidental two things occurring at once (possibly related to intermittent fasting?). We can try a different medication or can rechallange (re-try) the Protonix. Let us know what she would like to do.

## 2019-04-18 NOTE — Telephone Encounter (Signed)
I reviewed the ER note and labs from nearly passing out. They doubt GI etiology and feel most likely related to the fasting and not enough water. This is better evaluated by PCP. I don't think there will be any availability sooner than 3 weeks from now (especially because Dr. Gala Romney is our the next couple weeks.)  If scheduling can find an open slot they can bump her up.

## 2019-04-18 NOTE — Telephone Encounter (Signed)
EG, I spoke with pt. She started her Omeprazole this morning. Pt states that she just feels tried all the time since April, she doesn't have much of an appetite feels fine with eating once daily. She isn't sure if the fasting played a part in that. She had blood work done at the ED yesterday and has a f/u with her PCP on Monday. Pt wants to know if her procedure can be moved up from our office?

## 2019-04-19 ENCOUNTER — Other Ambulatory Visit: Payer: Self-pay

## 2019-04-19 ENCOUNTER — Ambulatory Visit (INDEPENDENT_AMBULATORY_CARE_PROVIDER_SITE_OTHER): Payer: BLUE CROSS/BLUE SHIELD | Admitting: Adult Health

## 2019-04-19 ENCOUNTER — Encounter: Payer: Self-pay | Admitting: Adult Health

## 2019-04-19 VITALS — BP 115/74 | HR 74 | Ht 64.0 in | Wt 278.0 lb

## 2019-04-19 DIAGNOSIS — R1030 Lower abdominal pain, unspecified: Secondary | ICD-10-CM

## 2019-04-19 DIAGNOSIS — Z975 Presence of (intrauterine) contraceptive device: Secondary | ICD-10-CM | POA: Diagnosis not present

## 2019-04-19 DIAGNOSIS — R309 Painful micturition, unspecified: Secondary | ICD-10-CM | POA: Diagnosis not present

## 2019-04-19 LAB — POCT URINALYSIS DIPSTICK
Blood, UA: NEGATIVE
Glucose, UA: NEGATIVE
Leukocytes, UA: NEGATIVE
Nitrite, UA: NEGATIVE
Protein, UA: NEGATIVE

## 2019-04-19 NOTE — Progress Notes (Signed)
Patient ID: Kristin [name], female   DOB: 1980-10-25, 38 y.o.   MRN: [id number] History of Present Illness: Kristin [name] is a  38 year old black female, divorced, G3P3 in complaining of some abdominal pain and occasional pain with peeing and stopping with IUD at times, has not had sex in over a year and has recently had GI issues and have procedures 8/7 with Dr Gala Romney, I think EGD and colonoscopy.  PCP is Angelina Ok NP.   Current Medications, Allergies, Past Medical History, Past Surgical History, Family History and Social History were reviewed in [employer] record.     Review of Systems: Abdomen pain  Pain with peeing at times Spotting with IUD     Physical Exam:BP 115/74 (BP Location: Left Arm, Patient Position: Sitting, Cuff Size: Large)   Pulse 74   Ht 5\' 4"  (1.626 m)   Wt 278 lb (126.1 kg)   BMI 47.72 kg/m urine dipstick small ketones. General:  Well developed, well nourished, no acute distress Skin:  Warm and dry Pelvic:  External genitalia is normal in appearance, no lesions.  The vagina is normal in appearance. Urethra has no lesions or masses. The cervix is bulbous. No IUD strings seen. Uterus is felt to be normal size, shape, and contour.  No adnexal masses, LLQ tenderness noted.Bladder is non tender, no masses felt. Psych:  No mood changes, alert and cooperative,seems happy Fall risk is low.  She had CT that showed IUD in place. We discussed that her pain my be GI related. Examination chaperoned by Diona Fanti CMA.   Impression: 1. Pain with urination   2. Lower abdominal pain   3. IUD (intrauterine device) in place       Plan: Follow up with GI Let me know if spotting continues  Push fluids

## 2019-04-22 NOTE — Telephone Encounter (Signed)
Noted. Spoke with pt. She is going to see her PCP today. She isn't sure if this visit is a virtual visit or an in person office visit. Pt was advised to notify her PCP of what's currently going on with her. Pt will call back if needed.

## 2019-04-29 ENCOUNTER — Ambulatory Visit: Payer: BLUE CROSS/BLUE SHIELD | Admitting: Orthopedic Surgery

## 2019-05-02 ENCOUNTER — Telehealth: Payer: Self-pay | Admitting: Adult Health

## 2019-05-02 NOTE — Telephone Encounter (Addendum)
Pt was seen 04/19/19 with pelvic pain. Still having cramping and spotting. Does she need an [place]?  Pt also requests an antibiotic for a boil on the inside of right thigh. Does she need another appt or can you prescribe something? Please advise. Thanks!! [place]

## 2019-05-02 NOTE — Telephone Encounter (Signed)
Pt states that she is still having pelvic pain on the left side and she would also like to see if a antibiotic can be sent in for a boil on her inner right thigh.

## 2019-05-02 NOTE — Telephone Encounter (Signed)
Has ?boil inner thigh about size of quarter, to come in [id number] at [facility]

## 2019-05-03 ENCOUNTER — Encounter: Payer: Self-pay | Admitting: Adult Health

## 2019-05-03 ENCOUNTER — Other Ambulatory Visit: Payer: Self-pay

## 2019-05-03 ENCOUNTER — Ambulatory Visit (INDEPENDENT_AMBULATORY_CARE_PROVIDER_SITE_OTHER): Payer: BLUE CROSS/BLUE SHIELD | Admitting: Adult Health

## 2019-05-03 VITALS — BP 127/80 | HR 84 | Ht 64.0 in | Wt 276.0 lb

## 2019-05-03 DIAGNOSIS — L02224 Furuncle of groin: Secondary | ICD-10-CM | POA: Diagnosis not present

## 2019-05-03 MED ORDER — SULFAMETHOXAZOLE-TRIMETHOPRIM 800-160 MG PO TABS: 1.0000 | ORAL_TABLET | Freq: Two times a day (BID) | ORAL | 0 refills | Status: DC

## 2019-05-03 NOTE — Patient Instructions (Signed)
Skin Abscess  A skin abscess is an infected area on or under your skin that contains a collection of pus and other material. An abscess may also be called a furuncle, carbuncle, or boil. An abscess can occur in or on almost any part of your body. Some abscesses break open (rupture) on their own. Most continue to get worse unless they are treated. The infection can spread deeper into the body and eventually into your blood, which can make you feel ill. Treatment usually involves draining the abscess. What are the causes? An abscess occurs when germs, like bacteria, pass through your skin and cause an infection. This may be caused by:  A scrape or cut on your skin.  A puncture wound through your skin, including a needle injection or insect bite.  Blocked oil or sweat glands.  Blocked and infected hair follicles.  A cyst that forms beneath your skin (sebaceous cyst) and becomes infected. What increases the risk? This condition is more likely to develop in people who:  Have a weak body defense system (immune system).  Have diabetes.  Have dry and irritated skin.  Get frequent injections or use illegal IV drugs.  Have a foreign body in a wound, such as a splinter.  Have problems with their lymph system or veins. What are the signs or symptoms? Symptoms of this condition include:  A painful, firm bump under the skin.  A bump with pus at the top. This may break through the skin and drain. Other symptoms include:  Redness surrounding the abscess site.  Warmth.  Swelling of the lymph nodes (glands) near the abscess.  Tenderness.  A sore on the skin. How is this diagnosed? This condition may be diagnosed based on:  A physical exam.  Your medical history.  A sample of pus. This may be used to find out what is causing the infection.  Blood tests.  Imaging tests, such as an ultrasound, CT scan, or MRI. How is this treated? A small abscess that drains on its own may not  need treatment. Treatment for larger abscesses may include:  Moist heat or heat pack applied to the area several times a day.  A procedure to drain the abscess (incision and drainage).  Antibiotic medicines. For a severe abscess, you may first get antibiotics through an IV and then change to antibiotics by mouth. Follow these instructions at home: Medicines   Take over-the-counter and prescription medicines only as told by your health care provider.  If you were prescribed an antibiotic medicine, take it as told by your health care provider. Do not stop taking the antibiotic even if you start to feel better. Abscess care   If you have an abscess that has not drained, apply heat to the affected area. Use the heat source that your health care provider recommends, such as a moist heat pack or a heating pad. ? Place a towel between your skin and the heat source. ? Leave the heat on for 20-30 minutes. ? Remove the heat if your skin turns bright red. This is especially important if you are unable to feel pain, heat, or cold. You may have a greater risk of getting burned.  Follow instructions from your health care provider about how to take care of your abscess. Make sure you: ? Cover the abscess with a bandage (dressing). ? Change your dressing or gauze as told by your health care provider. ? Wash your hands with soap and water before you change the   dressing or gauze. If soap and water are not available, use hand sanitizer.  Check your abscess every day for signs of a worsening infection. Check for: ? More redness, swelling, or pain. ? More fluid or blood. ? Warmth. ? More pus or a bad smell. General instructions  To avoid spreading the infection: ? Do not share personal care items, towels, or hot tubs with others. ? Avoid making skin contact with other people.  Keep all follow-up visits as told by your health care provider. This is important. Contact a health care provider if you  have:  More redness, swelling, or pain around your abscess.  More fluid or blood coming from your abscess.  Warm skin around your abscess.  More pus or a bad smell coming from your abscess.  A fever.  Muscle aches.  Chills or a general ill feeling. Get help right away if you:  Have severe pain.  See red streaks on your skin spreading away from the abscess. Summary  A skin abscess is an infected area on or under your skin that contains a collection of pus and other material.  A small abscess that drains on its own may not need treatment.  Treatment for larger abscesses may include having a procedure to drain the abscess and taking an antibiotic. This information is not intended to replace advice given to you by your health care provider. Make sure you discuss any questions you have with your health care provider. Document Released: 06/29/2005 Document Revised: 01/10/2019 Document Reviewed: 11/02/2017 Elsevier Patient Education  2020 Elsevier Inc.  

## 2019-05-03 NOTE — Progress Notes (Signed)
Patient ID: Kristin [name], female   DOB: 12/31/1980, 38 y.o.   MRN: [id number] History of Present Illness: Kristin [name] is a 38 year old black female, in complaining of boil right inner thigh near pantie line. PCP is Angelina Ok NP.  Current Medications, Allergies, Past Medical History, Past Surgical History, Family History and Social History were reviewed in [employer] record.     Review of Systems: ?boill right inner thigh She has appt with PCP Monday for ?knot in neck     Physical Exam:BP 127/80 (BP Location: Left Arm, Patient Position: Sitting, Cuff Size: Normal)   Pulse 84   Ht 5\' 4"  (1.626 m)   Wt 276 lb (125.2 kg)   BMI 47.38 kg/m  General:  Well developed, well nourished, no acute distress Skin:  Warm and dry Neck:  Midline trachea, normal thyroid, good ROM, no lymph nodes felt Pelvic:  External genitalia is normal in appearance,has 3 cm round soft, tender area right inner thigh near pantie line, no redness or induration   Psych:  No mood changes, alert and cooperative,seems happy Examination chaperoned by Levy Pupa LPN  Impression: 1. Boil of groin       Plan: Will rx septra ds  Meds ordered this encounter  Medications  . sulfamethoxazole-trimethoprim (BACTRIM DS) 800-160 MG tablet    Sig: Take 1 tablet by mouth 2 (two) times daily. Take 1 bid    Dispense:  20 tablet    Refill:  0    Order Specific Question:   Supervising Provider    Answer:   Tania Ade H [[id number]]  Review handout on boils and do not squeeze Follow up prn

## 2019-05-06 ENCOUNTER — Telehealth: Payer: Self-pay | Admitting: Internal Medicine

## 2019-05-06 NOTE — Telephone Encounter (Signed)
Pt called to confirm her procedure date. She asked for me to fax her the prep instructions. She has questions about taking/stopping her medications prior to prep. She said she had a covid test last week and will she still need to take another one this Wednesday. Please call her at [phone]

## 2019-05-06 NOTE — Telephone Encounter (Signed)
Patient is aware 

## 2019-05-06 NOTE — Telephone Encounter (Addendum)
LMOVM- patient will still have to go for testing on Wednesday. Must be within 2-3 days of procedure

## 2019-05-08 ENCOUNTER — Other Ambulatory Visit: Payer: Self-pay

## 2019-05-08 ENCOUNTER — Other Ambulatory Visit (HOSPITAL_COMMUNITY)
Admission: RE | Admit: 2019-05-08 | Discharge: 2019-05-08 | Disposition: A | Discharge status: Home / Self Care | Payer: BLUE CROSS/BLUE SHIELD | Source: Ambulatory Visit | Attending: Internal Medicine | Admitting: Internal Medicine

## 2019-05-08 DIAGNOSIS — Z01812 Encounter for preprocedural laboratory examination: Secondary | ICD-10-CM | POA: Diagnosis not present

## 2019-05-08 DIAGNOSIS — Z20828 Contact with and (suspected) exposure to other viral communicable diseases: Secondary | ICD-10-CM | POA: Diagnosis not present

## 2019-05-08 LAB — SARS CORONAVIRUS 2 (TAT 6-24 HRS): SARS Coronavirus 2: NEGATIVE

## 2019-05-10 ENCOUNTER — Encounter (HOSPITAL_COMMUNITY): Payer: Self-pay | Admitting: *Deleted

## 2019-05-10 ENCOUNTER — Encounter (HOSPITAL_COMMUNITY)
Admission: RE | Disposition: A | Discharge status: Home / Self Care | Payer: Self-pay | Source: Home / Self Care | Attending: Internal Medicine

## 2019-05-10 ENCOUNTER — Ambulatory Visit (HOSPITAL_COMMUNITY)
Admission: RE | Admit: 2019-05-10 | Discharge: 2019-05-10 | Disposition: A | Discharge status: Home / Self Care | Payer: BLUE CROSS/BLUE SHIELD | Attending: Internal Medicine | Admitting: Internal Medicine

## 2019-05-10 ENCOUNTER — Other Ambulatory Visit: Payer: Self-pay

## 2019-05-10 DIAGNOSIS — Z79899 Other long term (current) drug therapy: Secondary | ICD-10-CM | POA: Diagnosis not present

## 2019-05-10 DIAGNOSIS — K449 Diaphragmatic hernia without obstruction or gangrene: Secondary | ICD-10-CM

## 2019-05-10 DIAGNOSIS — G43909 Migraine, unspecified, not intractable, without status migrainosus: Secondary | ICD-10-CM | POA: Diagnosis not present

## 2019-05-10 DIAGNOSIS — Z800 Family history of malignant neoplasm of digestive organs: Secondary | ICD-10-CM | POA: Diagnosis not present

## 2019-05-10 DIAGNOSIS — R935 Abnormal findings on diagnostic imaging of other abdominal regions, including retroperitoneum: Secondary | ICD-10-CM

## 2019-05-10 DIAGNOSIS — Z8042 Family history of malignant neoplasm of prostate: Secondary | ICD-10-CM | POA: Insufficient documentation

## 2019-05-10 DIAGNOSIS — Z806 Family history of leukemia: Secondary | ICD-10-CM | POA: Insufficient documentation

## 2019-05-10 DIAGNOSIS — Z793 Long term (current) use of hormonal contraceptives: Secondary | ICD-10-CM | POA: Insufficient documentation

## 2019-05-10 DIAGNOSIS — R194 Change in bowel habit: Secondary | ICD-10-CM | POA: Insufficient documentation

## 2019-05-10 DIAGNOSIS — R948 Abnormal results of function studies of other organs and systems: Secondary | ICD-10-CM | POA: Diagnosis not present

## 2019-05-10 DIAGNOSIS — Z833 Family history of diabetes mellitus: Secondary | ICD-10-CM | POA: Insufficient documentation

## 2019-05-10 DIAGNOSIS — K219 Gastro-esophageal reflux disease without esophagitis: Secondary | ICD-10-CM

## 2019-05-10 DIAGNOSIS — Z791 Long term (current) use of non-steroidal anti-inflammatories (NSAID): Secondary | ICD-10-CM | POA: Insufficient documentation

## 2019-05-10 DIAGNOSIS — R1013 Epigastric pain: Secondary | ICD-10-CM | POA: Diagnosis not present

## 2019-05-10 DIAGNOSIS — Z8249 Family history of ischemic heart disease and other diseases of the circulatory system: Secondary | ICD-10-CM | POA: Diagnosis not present

## 2019-05-10 DIAGNOSIS — F329 Major depressive disorder, single episode, unspecified: Secondary | ICD-10-CM | POA: Insufficient documentation

## 2019-05-10 HISTORY — PX: COLONOSCOPY: SHX5424

## 2019-05-10 HISTORY — PX: ESOPHAGOGASTRODUODENOSCOPY: SHX5428

## 2019-05-10 SURGERY — COLONOSCOPY
Anesthesia: Moderate Sedation

## 2019-05-10 MED ORDER — MEPERIDINE HCL 100 MG/ML IJ SOLN
INTRAMUSCULAR | Status: DC | PRN
  Administered 2019-05-10: 25 mg
  Administered 2019-05-10: 15 mg
  Administered 2019-05-10: 10 mg

## 2019-05-10 MED ORDER — LIDOCAINE VISCOUS HCL 2 % MT SOLN
OROMUCOSAL | Status: AC
  Filled 2019-05-10: qty 15

## 2019-05-10 MED ORDER — ONDANSETRON HCL 4 MG/2ML IJ SOLN
INTRAMUSCULAR | Status: DC | PRN
  Administered 2019-05-10: 4 mg via INTRAVENOUS

## 2019-05-10 MED ORDER — LIDOCAINE VISCOUS HCL 2 % MT SOLN
OROMUCOSAL | Status: DC | PRN
  Administered 2019-05-10: 1 via OROMUCOSAL

## 2019-05-10 MED ORDER — SODIUM CHLORIDE 0.9 % IV SOLN
INTRAVENOUS | Status: DC
  Administered 2019-05-10: 12:00:00 via INTRAVENOUS

## 2019-05-10 MED ORDER — ONDANSETRON HCL 4 MG/2ML IJ SOLN
INTRAMUSCULAR | Status: AC
  Filled 2019-05-10: qty 2

## 2019-05-10 MED ORDER — MIDAZOLAM HCL 5 MG/5ML IJ SOLN
INTRAMUSCULAR | Status: AC
  Filled 2019-05-10: qty 10

## 2019-05-10 MED ORDER — MEPERIDINE HCL 50 MG/ML IJ SOLN
INTRAMUSCULAR | Status: AC
  Filled 2019-05-10: qty 1

## 2019-05-10 MED ORDER — MIDAZOLAM HCL 5 MG/5ML IJ SOLN
INTRAMUSCULAR | Status: DC | PRN
  Administered 2019-05-10: 2 mg via INTRAVENOUS
  Administered 2019-05-10 (×2): 1 mg via INTRAVENOUS
  Administered 2019-05-10: 2 mg via INTRAVENOUS
  Administered 2019-05-10 (×2): 1 mg via INTRAVENOUS

## 2019-05-10 MED ORDER — STERILE WATER FOR IRRIGATION IR SOLN
Status: DC | PRN
  Administered 2019-05-10: 13:00:00 1.5 mL

## 2019-05-10 NOTE — H&P (Signed)
@LOGO @   Primary Care Physician:  Renee Rival, NP Primary Gastroenterologist:  Dr. Gala Romney  Pre-Procedure History & Physical: HPI:  Kristin [name] is a 38 y.o. female here for further evaluation of GERD and of soft tissue abnormality in the vicinity of the esophagus.  Denies esophageal dysphagia.  Has had recent bowel issues with poorly visualized left colon on CT.  Here for colonoscopy.  Bowel function is normalized with Benefiber 3 spoons full daily.  Tried Protonix and omeprazole had a strange sensation of palpitations and fullness in her chest for which she stopped these medications.  Past Medical History:  Diagnosis Date  . Allergy   . Carpal tunnel syndrome of right wrist 10/22/2018  . Contraceptive management 11/05/2013  . Depression   . GERD (gastroesophageal reflux disease)   . IUD (intrauterine device) in place 01/13/2016  . Migraines   . Missed periods 01/13/2016  . Vaginal Pap smear, abnormal     Past Surgical History:  Procedure Laterality Date  . NO PAST SURGERIES      Prior to Admission medications   Medication Sig Start Date End Date Taking? Authorizing Provider  cetirizine (ZYRTEC) 10 MG tablet Take 10 mg by mouth daily.   Yes [provider]  cholecalciferol (VITAMIN D3) 25 MCG (1000 UT) tablet Take 1,000 Units by mouth daily.   Yes [provider]  fluticasone (FLONASE) 50 MCG/ACT nasal spray Place 2 sprays into both nostrils daily.  12/25/18  Yes [provider]  hydrOXYzine (ATARAX/VISTARIL) 10 MG tablet Take 10 mg by mouth 3 (three) times daily as needed for anxiety.    Yes [provider]  ibuprofen (ADVIL) 800 MG tablet Take 800 mg by mouth every 8 (eight) hours as needed for headache.  05/03/19  Yes [provider]  levonorgestrel (MIRENA) 20 MCG/24HR IUD 1 each by Intrauterine route once.   Yes [provider]  Multiple Vitamins-Minerals (MULTIVITAMIN WITH MINERALS) tablet Take 1 tablet by mouth daily.    Yes [provider]  rizatriptan (MAXALT) 10 MG tablet TAKE 1 TABLET BY MOUTH ONCE AT ONSET OF SYMPTOMS, MAY REPEAT DOSE IN 2 HOURS IF NEEDED. Patient taking differently: Take 10 mg by mouth See admin instructions. TAKE 1 TABLET BY MOUTH ONCE AT ONSET OF SYMPTOMS, MAY REPEAT DOSE IN 2 HOURS IF NEEDED. 01/15/18  Yes Raylene Everts, MD  sulfamethoxazole-trimethoprim (BACTRIM DS) 800-160 MG tablet Take 1 tablet by mouth 2 (two) times daily. Take 1 bid 05/03/19  Yes Derrek Monaco A, NP  vitamin C (ASCORBIC ACID) 500 MG tablet Take 500 mg by mouth daily.   Yes [provider]  Wheat Dextrin (BENEFIBER PO) Take 1 Scoop by mouth daily. Once daily    Yes [provider]  omeprazole (PRILOSEC) 20 MG capsule Take 1 capsule (20 mg total) by mouth 2 (two) times daily before a meal. 03/18/19   Carlis Stable, NP    Allergies as of 03/18/2019  . (No Known Allergies)    Family History  Problem Relation Age of Onset  . Other Mother        enlarged heart  . Diabetes Mother   . Hypertension Mother   . Hyperlipidemia Mother   . Heart disease Mother        heart murmer  . Miscarriages / [place] Mother   . Hypertension Father   . Hyperlipidemia Father   . Cancer Father        prostate  . Other Brother  enlarged heart; colloid cyst of the third ventricle( of the brain)  . Cancer Paternal Grandmother        leukemia  . Cancer Paternal Grandfather        lung  . Hypertension Sister   . Colon cancer Maternal Grandmother   . Colon cancer Maternal Aunt     Social History   Socioeconomic History  . Marital status: Divorced    Spouse name: Not on file  . Number of children: 3  . Years of education: 8814  . Highest education level: Not on file  Occupational History  . Occupation: Public house managerprogram assistant    Comment: [employer]  Social Needs  . Financial resource strain: Not on file  . Food insecurity    Worry: Not on file    Inability: Not on file  .  Transportation needs    Medical: Not on file    Non-medical: Not on file  Tobacco Use  . Smoking status: Never Smoker  . Smokeless tobacco: Never Used  Substance and Sexual Activity  . Alcohol use: Not Currently    Comment: occasional  . Drug use: No  . Sexual activity: Not Currently    Birth control/protection: I.U.D.  Lifestyle  . Physical activity    Days per week: Not on file    Minutes per session: Not on file  . Stress: Not on file  Relationships  . Social Musicianconnections    Talks on phone: Not on file    Gets together: Not on file    Attends religious service: Not on file    Active member of club or organization: Not on file    Attends meetings of clubs or organizations: Not on file    Relationship status: Not on file  . Intimate partner violence    Fear of current or ex partner: Not on file    Emotionally abused: Not on file    Physically abused: Not on file    Forced sexual activity: Not on file  Other Topics Concern  . Not on file  Social History Narrative   Lives with parents   Looking for own place   Tries to walk daily    Review of Systems: See HPI, otherwise negative ROS  Physical Exam: BP 95/68   Pulse 89   Temp 98.2 F (36.8 C) (Oral)   Resp 20   Ht 5\' 4"  (1.626 m)   Wt 125.2 kg   SpO2 97%   BMI 47.38 kg/m  General:   Alert,  Well-developed, well-nourished, pleasant and cooperative in NAD Neck:  Supple; no masses or thyromegaly. No significant cervical adenopathy. Lungs:  Clear throughout to auscultation.   No wheezes, crackles, or rhonchi. No acute distress. Heart:  Regular rate and rhythm; no murmurs, clicks, rubs,  or gallops. Abdomen: Non-distended, normal bowel sounds.  Soft and nontender without appreciable mass or hepatosplenomegaly.  Pulses:  Normal pulses noted. Extremities:  Without clubbing or edema.  Impression/Plan: [age] year old lady with GERD and abnormal distal esophagus on imaging also poorly visualized left colon with associated  symptoms.  EGD and colonoscopy today per plan. The risks, benefits, limitations, imponderables and alternatives regarding both EGD and colonoscopy have been reviewed with the patient. Questions have been answered. All parties agreeable.      Notice: This dictation was prepared with Dragon dictation along with smaller phrase technology. Any transcriptional errors that result from this process are unintentional and may not be corrected upon review.

## 2019-05-10 NOTE — Op Note (Signed)
[facility] Patient Name: Kristin IvanMiranda Hudgins Procedure Date: 05/10/2019 10:21 AM MRN: [id number] Date of Birth: 07/13/1981 Attending MD: Gennette Pacobert Michael Serrina [place] , MD CSN: [id number] Age: 38 Admit Type: Outpatient Procedure:                Colonoscopy Indications:              Abnormal CT of the GI tract, Change in bowel habits Providers:                Gennette Pacobert Michael Shatara Stanek, MD, Sterling Bigiffani Roberts, RN,                            Dyann Ruddleonya Wilson Referring MD:              Medicines:                Midazolam 8 mg IV, Meperidine 50 mg IV Complications:            No immediate complications. Estimated Blood Loss:     Estimated blood loss: none. Procedure:                Pre-Anesthesia Assessment:                           - Prior to the procedure, a History and Physical                            was performed, and patient medications and                            allergies were reviewed. The patient's tolerance of                            previous anesthesia was also reviewed. The risks                            and benefits of the procedure and the sedation                            options and risks were discussed with the patient.                            All questions were answered, and informed consent                            was obtained. Prior Anticoagulants: The patient has                            taken no previous anticoagulant or antiplatelet                            agents. ASA Grade Assessment: II - A patient with                            mild systemic disease. After reviewing the risks  and benefits, the patient was deemed in                            satisfactory condition to undergo the procedure.                           After obtaining informed consent, the colonoscope                            was passed under direct vision. Throughout the                            procedure, the patient's blood pressure, pulse, and               oxygen saturations were monitored continuously. The                            [id number]-[id number] ([id number]) scope was introduced through                            the anus and advanced to the the cecum, identified                            by appendiceal orifice and ileocecal valve. The                            colonoscopy was performed without difficulty. The                            patient tolerated the procedure well. The quality                            of the bowel preparation was adequate. Scope In: 1:02:17 PM Scope Out: 1:15:58 PM Scope Withdrawal Time: 0 hours 6 minutes 56 seconds  Total Procedure Duration: 0 hours 13 minutes 41 seconds  Findings:      The perianal and digital rectal examinations were normal.      The colon (entire examined portion) appeared normal.      The retroflexed view of the distal rectum and anal verge was normal and       showed no anal or rectal abnormalities. Impression:               - The entire examined colon is normal.                           - The distal rectum and anal verge are normal on                            retroflexion view.                           - No specimens collected. Entire colon seen well.                            Of note, patient's bowel symptoms  have improved on                            daily Benefiber Moderate Sedation:      Moderate (conscious) sedation was administered by the endoscopy nurse       and supervised by the endoscopist. The following parameters were       monitored: oxygen saturation, heart rate, blood pressure, respiratory       rate, EKG, adequacy of pulmonary ventilation, and response to care.       Total physician intraservice time was 32 minutes. Recommendation:           - Patient has a contact number available for                            emergencies. The signs and symptoms of potential                            delayed complications were discussed with the                             patient. Return to normal activities tomorrow.                            Written discharge instructions were provided to the                            patient.                           - Resume previous diet.                           - Continue present medications. To new Benefiber                            daily.                           - Repeat colonoscopy in 10 years for screening                            purposes. See EGD report.                           - Return to GI office in 3 months. Procedure Code(s):        --- Professional ---                           [phone], Colonoscopy, flexible; diagnostic, including                            collection of specimen(s) by brushing or washing,                            when performed (separate procedure)  [id number], Moderate sedation; each additional 15                            minutes intraservice time                           G0500, Moderate sedation services provided by the                            same physician or other qualified health care                            professional performing a gastrointestinal                            endoscopic service that sedation supports,                            requiring the presence of an independent trained                            observer to assist in the monitoring of the                            patient's level of consciousness and physiological                            status; initial 15 minutes of intra-service time;                            patient age 69 years or older (additional time may                            be reported with [id number], as appropriate) Diagnosis Code(s):        --- Professional ---                           R19.4, Change in bowel habit                           R93.3, Abnormal findings on diagnostic imaging of                            other parts of digestive tract CPT copyright 2019 American Medical Association.  All rights reserved. The codes documented in this report are preliminary and upon coder review may  be revised to meet current compliance requirements. Gerrit Friendsobert M. Elainah Rhyne, MD Gennette Pacobert Michael Jumanah Hynson, MD 05/10/2019 1:27:56 PM This report has been signed electronically. Number of Addenda: 0

## 2019-05-10 NOTE — Op Note (Signed)
[facility] Patient Name: Kristin IvanMiranda [name] Procedure Date: 05/10/2019 10:21 AM MRN: [id number] Date of Birth: 04/11/1981 Attending MD: Gennette Pacobert Michael Thamara Leger , MD CSN: [id number] Age: 38   Admit Type: Outpatient Procedure:                Upper GI endoscopy Indications:              Dyspepsia, Abnormal CT of the GI tract Providers:                Gennette Pacobert Michael Arish Redner, MD, Sterling Bigiffani Roberts, RN,                            Dyann Ruddleonya Wilson Referring MD:              Medicines:                Midazolam 6 mg IV, Meperidine 50 mg IV, Ondansetron                            4 mg IV Complications:            No immediate complications. Estimated Blood Loss:     Estimated blood loss: none. Procedure:                Pre-Anesthesia Assessment:                           - Prior to the procedure, a History and Physical                            was performed, and patient medications and                            allergies were reviewed. The patient's tolerance of                            previous anesthesia was also reviewed. The risks                            and benefits of the procedure and the sedation                            options and risks were discussed with the patient.                            All questions were answered, and informed consent                            was obtained. Prior Anticoagulants: The patient has                            taken no previous anticoagulant or antiplatelet                            agents. ASA Grade Assessment: II - A patient with  mild systemic disease. After reviewing the risks                            and benefits, the patient was deemed in                            satisfactory condition to undergo the procedure.                           After obtaining informed consent, the endoscope was                            passed under direct vision. Throughout the                            procedure, the patient's  blood pressure, pulse, and                            oxygen saturations were monitored continuously. The                            [id number]-[id number] ([id number]) scope was introduced through the                            mouth, and advanced to the second part of duodenum.                            The upper GI endoscopy was accomplished without                            difficulty. The patient tolerated the procedure                            well. Scope In: 12:53:49 PM Scope Out: 29:56:21 PM Total Procedure Duration: 0 hours 4 minutes 22 seconds  Findings:      The examined esophagus was normal.      A hiatal hernia was present.      The exam was otherwise without abnormality.      The duodenal bulb and second portion of the duodenum were normal. Impression:               - Normal esophagus.                           - Hiatal hernia.                           - The examination was otherwise normal.                           - Normal duodenal bulb and second portion of the                            duodenum.                           -  No specimens collected. Moderate Sedation:      Moderate (conscious) sedation was administered by the endoscopy nurse       and supervised by the endoscopist. The following parameters were       monitored: oxygen saturation, heart rate, blood pressure, respiratory       rate, EKG, adequacy of pulmonary ventilation, and response to care.      Moderate (conscious) sedation was administered by the endoscopy nurse       and supervised by the endoscopist. The following parameters were       monitored: oxygen saturation, heart rate, blood pressure, respiratory       rate, EKG, adequacy of pulmonary ventilation, and response to care.       Total physician intraservice time was 15 minutes. Recommendation:           - Patient has a contact number available for                            emergencies. The signs and symptoms of potential                             delayed complications were discussed with the                            patient. Return to normal activities tomorrow.                            Written discharge instructions were provided to the                            patient.                           - Resume previous diet.                           - Continue present medications. Trial of AcipHex 20                            mg daily for GERD. Office visit with us in 3                            months. See colonoscopy report. Procedure Code(s):        --- Professional ---                           [id number], Esophagogastroduodenoscopy, flexible,                            transoral; diagnostic, including collection of                            specimen(s) by brushing or washing, when performed                            (separate procedure)  G0500, Moderate sedation services provided by the                            same physician or other qualified health care                            professional performing a gastrointestinal                            endoscopic service that sedation supports,                            requiring the presence of an independent trained                            observer to assist in the monitoring of the                            patient's level of consciousness and physiological                            status; initial 15 minutes of intra-service time;                            patient age 40 years or older (additional time may                            be reported with [id number], as appropriate) Diagnosis Code(s):        --- Professional ---                           K44.9, Diaphragmatic hernia without obstruction or                            gangrene                           R10.13, Epigastric pain                           R93.3, Abnormal findings on diagnostic imaging of                            other parts of digestive tract CPT copyright 2019 American  Medical Association. All rights reserved. The codes documented in this report are preliminary and upon coder review may  be revised to meet current compliance requirements. Gerrit Friendsobert M. Lorean Ekstrand, MD Gennette Pacobert Michael Jora Galluzzo, MD 05/10/2019 1:24:00 PM This report has been signed electronically. Number of Addenda: 0

## 2019-05-10 NOTE — Discharge Instructions (Signed)
°Colonoscopy °Discharge Instructions ° °Read the instructions outlined below and refer to this sheet in the next few weeks. These discharge instructions provide you with general information on caring for yourself after you leave the hospital. Your doctor may also give you specific instructions. While your treatment has been planned according to the most current medical practices available, unavoidable complications occasionally occur. If you have any problems or questions after discharge, call Dr. Rourk at [phone]. °ACTIVITY °· You may resume your regular activity, but move at a slower pace for the next 24 hours.  °· Take frequent rest periods for the next 24 hours.  °· Walking will help get rid of the air and reduce the bloated feeling in your belly (abdomen).  °· No driving for 24 hours (because of the medicine (anesthesia) used during the test).   °· Do not sign any important legal documents or operate any machinery for 24 hours (because of the anesthesia used during the test).  °NUTRITION °· Drink plenty of fluids.  °· You may resume your normal diet as instructed by your doctor.  °· Begin with a light meal and progress to your normal diet. Heavy or fried foods are harder to digest and may make you feel sick to your stomach (nauseated).  °· Avoid alcoholic beverages for 24 hours or as instructed.  °MEDICATIONS °· You may resume your normal medications unless your doctor tells you otherwise.  °WHAT YOU CAN EXPECT TODAY °· Some feelings of bloating in the abdomen.  °· Passage of more gas than usual.  °· Spotting of blood in your stool or on the toilet paper.  °IF YOU HAD POLYPS REMOVED DURING THE COLONOSCOPY: °· No aspirin products for 7 days or as instructed.  °· No alcohol for 7 days or as instructed.  °· Eat a soft diet for the next 24 hours.  °FINDING OUT THE RESULTS OF YOUR TEST °Not all test results are available during your visit. If your test results are not back during the visit, make an appointment  with your caregiver to find out the results. Do not assume everything is normal if you have not heard from your caregiver or the medical facility. It is important for you to follow up on all of your test results.  °SEEK IMMEDIATE MEDICAL ATTENTION IF: °· You have more than a spotting of blood in your stool.  °· Your belly is swollen (abdominal distention).  °· You are nauseated or vomiting.  °· You have a temperature over 101.  °· You have abdominal pain or discomfort that is severe or gets worse throughout the day.  °EGD °Discharge instructions °Please read the instructions outlined below and refer to this sheet in the next few weeks. These discharge instructions provide you with general information on caring for yourself after you leave the hospital. Your doctor may also give you specific instructions. While your treatment has been planned according to the most current medical practices available, unavoidable complications occasionally occur. If you have any problems or questions after discharge, please call your doctor. °ACTIVITY °· You may resume your regular activity but move at a slower pace for the next 24 hours.  °· Take frequent rest periods for the next 24 hours.  °· Walking will help expel (get rid of) the air and reduce the bloated feeling in your abdomen.  °· No driving for 24 hours (because of the anesthesia (medicine) used during the test).  °· You may shower.  °· Do not sign any important   legal documents or operate any machinery for 24 hours (because of the anesthesia used during the test).  NUTRITION  Drink plenty of fluids.   You may resume your normal diet.   Begin with a light meal and progress to your normal diet.   Avoid alcoholic beverages for 24 hours or as instructed by your caregiver.  MEDICATIONS  You may resume your normal medications unless your caregiver tells you otherwise.  WHAT YOU CAN EXPECT TODAY  You may experience abdominal discomfort such as a feeling of fullness  or gas pains.  FOLLOW-UP  Your doctor will discuss the results of your test with you.  SEEK IMMEDIATE MEDICAL ATTENTION IF ANY OF THE FOLLOWING OCCUR:  Excessive nausea (feeling sick to your stomach) and/or vomiting.   Severe abdominal pain and distention (swelling).   Trouble swallowing.   Temperature over 101 F (37.8 C).   Rectal bleeding or vomiting of blood.   Information on GERD and hiatal hernia provided  Begin AcipHex 20 mg daily for acid reflux  Continue Benefiber daily to promote good bowel function  Repeat colonoscopy for screening purposes in 10 years  Office visit with [place] in 3 months  I discussed my findings and recommendations with father, Kristin [name], at [phone].   Gastroesophageal Reflux Disease, Adult Gastroesophageal reflux (GER) happens when acid from the stomach flows up into the tube that connects the mouth and the stomach (esophagus). Normally, food travels down the esophagus and stays in the stomach to be digested. However, when a person has GER, food and stomach acid sometimes move back up into the esophagus. If this becomes a more serious problem, the person may be diagnosed with a disease called gastroesophageal reflux disease (GERD). GERD occurs when the reflux:  Happens often.  Causes frequent or severe symptoms.  Causes problems such as damage to the esophagus. When stomach acid comes in contact with the esophagus, the acid may cause soreness (inflammation) in the esophagus. Over time, GERD may create small holes (ulcers) in the lining of the esophagus. What are the causes? This condition is caused by a problem with the muscle between the esophagus and the stomach (lower esophageal sphincter, or LES). Normally, the LES muscle closes after food passes through the esophagus to the stomach. When the LES is weakened or abnormal, it does not close properly, and that allows food and stomach acid to go back up into the esophagus. The LES can be weakened  by certain dietary substances, medicines, and medical conditions, including:  Tobacco use.  Pregnancy.  Having a hiatal hernia.  Alcohol use.  Certain foods and beverages, such as coffee, chocolate, onions, and peppermint. What increases the risk? You are more likely to develop this condition if you:  Have an increased body weight.  Have a connective tissue disorder.  Use NSAID medicines. What are the signs or symptoms? Symptoms of this condition include:  Heartburn.  Difficult or painful swallowing.  The feeling of having a lump in the throat.  Abitter taste in the mouth.  Bad breath.  Having a large amount of saliva.  Having an upset or bloated stomach.  Belching.  Chest pain. Different conditions can cause chest pain. Make sure you see your health care provider if you experience chest pain.  Shortness of breath or wheezing.  Ongoing (chronic) cough or a night-time cough.  Wearing away of tooth enamel.  Weight loss. How is this diagnosed? Your health care provider will take a medical history and perform a physical exam.  To determine if you have mild or severe GERD, your health care provider may also monitor how you respond to treatment. You may also have tests, including:  A test to examine your stomach and esophagus with a small camera (endoscopy).  A test thatmeasures the acidity level in your esophagus.  A test thatmeasures how much pressure is on your esophagus.  A barium swallow or modified barium swallow test to show the shape, size, and functioning of your esophagus. How is this treated? The goal of treatment is to help relieve your symptoms and to prevent complications. Treatment for this condition may vary depending on how severe your symptoms are. Your health care provider may recommend:  Changes to your diet.  Medicine.  Surgery. Follow these instructions at home: Eating and drinking   Follow a diet as recommended by your health  care provider. This may involve avoiding foods and drinks such as: ? Coffee and tea (with or without caffeine). ? Drinks that containalcohol. ? Energy drinks and sports drinks. ? Carbonated drinks or sodas. ? Chocolate and cocoa. ? Peppermint and mint flavorings. ? Garlic and onions. ? Horseradish. ? Spicy and acidic foods, including peppers, chili powder, curry powder, vinegar, hot sauces, and barbecue sauce. ? Citrus fruit juices and citrus fruits, such as oranges, lemons, and limes. ? Tomato-based foods, such as red sauce, chili, salsa, and pizza with red sauce. ? Fried and fatty foods, such as donuts, french fries, potato chips, and high-fat dressings. ? High-fat meats, such as hot dogs and fatty cuts of red and white meats, such as rib eye steak, sausage, ham, and bacon. ? High-fat dairy items, such as whole milk, butter, and cream cheese.  Eat small, frequent meals instead of large meals.  Avoid drinking large amounts of liquid with your meals.  Avoid eating meals during the 2-3 hours before bedtime.  Avoid lying down right after you eat.  Do not exercise right after you eat. Lifestyle   Do not use any products that contain nicotine or tobacco, such as cigarettes, e-cigarettes, and chewing tobacco. If you need help quitting, ask your health care provider.  Try to reduce your stress by using methods such as yoga or meditation. If you need help reducing stress, ask your health care provider.  If you are overweight, reduce your weight to an amount that is healthy for you. Ask your health care provider for guidance about a safe weight loss goal. General instructions  Pay attention to any changes in your symptoms.  Take over-the-counter and prescription medicines only as told by your health care provider. Do not take aspirin, ibuprofen, or other NSAIDs unless your health care provider told you to do so.  Wear loose-fitting clothing. Do not wear anything tight around your  waist that causes pressure on your abdomen.  Raise (elevate) the head of your bed about 6 inches (15 cm).  Avoid bending over if this makes your symptoms worse.  Keep all follow-up visits as told by your health care provider. This is important. Contact a health care provider if:  You have: ? New symptoms. ? Unexplained weight loss. ? Difficulty swallowing or it hurts to swallow. ? Wheezing or a persistent cough. ? A hoarse voice.  Your symptoms do not improve with treatment. Get help right away if you:  Have pain in your arms, neck, jaw, teeth, or back.  Feel sweaty, dizzy, or light-headed.  Have chest pain or shortness of breath.  Vomit and your vomit looks like blood  or coffee grounds.  Faint.  Have stool that is bloody or black.  Cannot swallow, drink, or eat. Summary  Gastroesophageal reflux happens when acid from the stomach flows up into the esophagus. GERD is a disease in which the reflux happens often, causes frequent or severe symptoms, or causes problems such as damage to the esophagus.  Treatment for this condition may vary depending on how severe your symptoms are. Your health care provider may recommend diet and lifestyle changes, medicine, or surgery.  Contact a health care provider if you have new or worsening symptoms.  Take over-the-counter and prescription medicines only as told by your health care provider. Do not take aspirin, ibuprofen, or other NSAIDs unless your health care provider told you to do so.  Keep all follow-up visits as told by your health care provider. This is important. This information is not intended to replace advice given to you by your health care provider. Make sure you discuss any questions you have with your health care provider. Document Released: 06/29/2005 Document Revised: 03/28/2018 Document Reviewed: 03/28/2018   Hiatal Hernia  A hiatal hernia occurs when part of the stomach slides above the muscle that separates the  abdomen from the chest (diaphragm). A person can be born with a hiatal hernia (congenital), or it may develop over time. In almost all cases of hiatal hernia, only the top part of the stomach pushes through the diaphragm. Many people have a hiatal hernia with no symptoms. The larger the hernia, the more likely it is that you will have symptoms. In some cases, a hiatal hernia allows stomach acid to flow back into the tube that carries food from your mouth to your stomach (esophagus). This may cause heartburn symptoms. Severe heartburn symptoms may mean that you have developed a condition called gastroesophageal reflux disease (GERD). What are the causes? This condition is caused by a weakness in the opening (hiatus) where the esophagus passes through the diaphragm to attach to the upper part of the stomach. A person may be born with a weakness in the hiatus, or a weakness can develop over time. What increases the risk? This condition is more likely to develop in:  Older people. Age is a major risk factor for a hiatal hernia, especially if you are over the age of 40.  Pregnant women.  People who are overweight.  People who have frequent constipation. What are the signs or symptoms? Symptoms of this condition usually develop in the form of GERD symptoms. Symptoms include:  Heartburn.  Belching.  Indigestion.  Trouble swallowing.  Coughing or wheezing.  Sore throat.  Hoarseness.  Chest pain.  Nausea and vomiting. How is this diagnosed? This condition may be diagnosed during testing for GERD. Tests that may be done include:  X-rays of your stomach or chest.  An upper gastrointestinal (GI) series. This is an X-ray exam of your GI tract that is taken after you swallow a chalky liquid that shows up clearly on the X-ray.  Endoscopy. This is a procedure to look into your stomach using a thin, flexible tube that has a tiny camera and light on the end of it. How is this treated? This  condition may be treated by:  Dietary and lifestyle changes to help reduce GERD symptoms.  Medicines. These may include: ? Over-the-counter antacids. ? Medicines that make your stomach empty more quickly. ? Medicines that block the production of stomach acid (H2 blockers). ? Stronger medicines to reduce stomach acid (proton pump inhibitors).  Surgery to repair the hernia, if other treatments are not helping. If you have no symptoms, you may not need treatment. Follow these instructions at home: Lifestyle and activity  Do not use any products that contain nicotine or tobacco, such as cigarettes and e-cigarettes. If you need help quitting, ask your health care provider.  Try to achieve and maintain a healthy body weight.  Avoid putting pressure on your abdomen. Anything that puts pressure on your abdomen increases the amount of acid that may be pushed up into your esophagus. ? Avoid bending over, especially after eating. ? Raise the head of your bed by putting blocks under the legs. This keeps your head and esophagus higher than your stomach. ? Do not wear tight clothing around your chest or stomach. ? Try not to strain when having a bowel movement, when urinating, or when lifting heavy objects. Eating and drinking  Avoid foods that can worsen GERD symptoms. These may include: ? Fatty foods, like fried foods. ? Citrus fruits, like oranges or lemon. ? Other foods and drinks that contain acid, like orange juice or tomatoes. ? Spicy food. ? Chocolate.  Eat frequent small meals instead of three large meals a day. This helps prevent your stomach from getting too full. ? Eat slowly. ? Do not lie down right after eating. ? Do not eat 1-2 hours before bed.  Do not drink beverages with caffeine. These include cola, coffee, cocoa, and tea.  Do not drink alcohol. General instructions  Take over-the-counter and prescription medicines only as told by your health care provider.  Keep all  follow-up visits as told by your health care provider. This is important. Contact a health care provider if:  Your symptoms are not controlled with medicines or lifestyle changes.  You are having trouble swallowing.  You have coughing or wheezing that will not go away. Get help right away if:  Your pain is getting worse.  Your pain spreads to your arms, neck, jaw, teeth, or back.  You have shortness of breath.  You sweat for no reason.  You feel sick to your stomach (nauseous) or you vomit.  You vomit blood.  You have bright red blood in your stools.  You have black, tarry stools. This information is not intended to replace advice given to you by your health care provider. Make sure you discuss any questions you have with your health care provider. Document Released: 12/10/2003 Document Revised: 09/01/2017 Document Reviewed: 04/24/2017 [employer]vier Patient Education  2020 [employer]

## 2019-05-13 ENCOUNTER — Encounter: Payer: Self-pay | Admitting: Orthopedic Surgery

## 2019-05-13 ENCOUNTER — Ambulatory Visit: Payer: BLUE CROSS/BLUE SHIELD | Admitting: Orthopedic Surgery

## 2019-05-13 ENCOUNTER — Encounter: Payer: Self-pay | Admitting: Internal Medicine

## 2019-05-13 ENCOUNTER — Other Ambulatory Visit: Payer: Self-pay

## 2019-05-13 VITALS — BP 96/64 | HR 64 | Ht 64.0 in | Wt 276.0 lb

## 2019-05-13 DIAGNOSIS — M722 Plantar fascial fibromatosis: Secondary | ICD-10-CM | POA: Diagnosis not present

## 2019-05-13 DIAGNOSIS — Z6841 Body Mass Index (BMI) 40.0 and over, adult: Secondary | ICD-10-CM | POA: Diagnosis not present

## 2019-05-13 NOTE — Patient Instructions (Addendum)
Try to lose some weight if possible   Continue all the ice exercises and heel cups  You have received an injection of steroids into the joint. 15% of patients will have increased pain within the 24 hours postinjection.   This is transient and will go away.   We recommend that you use ice packs on the injection site for 20 minutes every 2 hours and extra strength Tylenol 2 tablets every 8 as needed until the pain resolves.  If you continue to have pain after taking the Tylenol and using the ice please call the office for further instructions.

## 2019-05-13 NOTE — Progress Notes (Signed)
Progress Note   Patient ID: Kristin [name], female   DOB: Apr 09, 1981, 38 y.o.   MRN: [id number]   Chief Complaint  Patient presents with  . Foot Pain    right/ has pain with shoe wear/ flip flops today    Encounter Diagnoses  Name Primary?  . Plantar fasciitis-right Yes  . Body mass index 45.0-49.9, adult ([place])   . Morbid obesity ([place])     38 year old female previously treated for plantar fasciitis slight improvement still hurting in the heel      Review of Systems  Neurological: Negative for tingling.      BP 96/64   Pulse 64   Ht 5\' 4"  (1.626 m)   Wt 276 lb (125.2 kg)   BMI 47.38 kg/m  The patient meets the AMA guidelines for Morbid (severe) obesity with a BMI > 40.0 and I have recommended weight loss.  Physical Exam Vitals signs and nursing note reviewed.  Constitutional:      Appearance: Normal appearance.  Musculoskeletal:     Comments: Slight flexible pes planus tenderness at the heel plantar fascial insertion  Neurological:     Mental Status: She is alert and oriented to person, place, and time.  Psychiatric:        Mood and Affect: Mood normal.      Medical decisions:  (Established problem worse, x-ray ,physical therapy, over-the-counter medicines, read outside film or summarize x-ray)  Data  Imaging:   N/a   Encounter Diagnoses  Name Primary?  . Plantar fasciitis-right Yes  . Body mass index 45.0-49.9, adult ([place])   . Morbid obesity ([place])     PLAN:   Procedure note Inject plantar fascia   Timeout was completed to confirm the site of injection right foot   The medications used were 40 mg of Depo-Medrol and 1% lidocaine 3 cc  Anesthesia was provided by ethyl chloride and the skin was prepped with alcohol.  After cleaning the skin with alcohol a 25-gauge needle was used to inject the plantar fascia, no complications were noted sterile bandage was applied  FU 6 weeks   Arther Abbott, MD 05/13/2019 11:17 AM

## 2019-05-16 ENCOUNTER — Other Ambulatory Visit (HOSPITAL_COMMUNITY): Payer: Self-pay | Admitting: Nurse Practitioner

## 2019-05-16 ENCOUNTER — Other Ambulatory Visit: Payer: Self-pay | Admitting: Nurse Practitioner

## 2019-05-16 DIAGNOSIS — E049 Nontoxic goiter, unspecified: Secondary | ICD-10-CM

## 2019-05-21 ENCOUNTER — Encounter (HOSPITAL_COMMUNITY): Payer: Self-pay | Admitting: Internal Medicine

## 2019-05-22 ENCOUNTER — Other Ambulatory Visit: Payer: Self-pay

## 2019-05-22 ENCOUNTER — Ambulatory Visit (HOSPITAL_COMMUNITY)
Admission: RE | Admit: 2019-05-22 | Discharge: 2019-05-22 | Disposition: A | Discharge status: Home / Self Care | Payer: BLUE CROSS/BLUE SHIELD | Source: Ambulatory Visit | Attending: Nurse Practitioner | Admitting: Nurse Practitioner

## 2019-05-22 DIAGNOSIS — E049 Nontoxic goiter, unspecified: Secondary | ICD-10-CM | POA: Insufficient documentation

## 2019-05-24 ENCOUNTER — Other Ambulatory Visit (HOSPITAL_COMMUNITY): Payer: Self-pay | Admitting: Nurse Practitioner

## 2019-05-24 ENCOUNTER — Other Ambulatory Visit: Payer: Self-pay | Admitting: Nurse Practitioner

## 2019-05-24 DIAGNOSIS — R591 Generalized enlarged lymph nodes: Secondary | ICD-10-CM

## 2019-05-29 ENCOUNTER — Other Ambulatory Visit: Payer: Self-pay

## 2019-05-29 ENCOUNTER — Ambulatory Visit (HOSPITAL_COMMUNITY)
Admission: RE | Admit: 2019-05-29 | Discharge: 2019-05-29 | Disposition: A | Discharge status: Home / Self Care | Payer: BLUE CROSS/BLUE SHIELD | Source: Ambulatory Visit | Attending: Nurse Practitioner | Admitting: Nurse Practitioner

## 2019-05-29 DIAGNOSIS — R591 Generalized enlarged lymph nodes: Secondary | ICD-10-CM | POA: Insufficient documentation

## 2019-06-11 ENCOUNTER — Ambulatory Visit: Payer: BLUE CROSS/BLUE SHIELD | Admitting: Gastroenterology

## 2019-06-12 ENCOUNTER — Encounter: Payer: Self-pay | Admitting: Nurse Practitioner

## 2019-06-12 ENCOUNTER — Other Ambulatory Visit: Payer: Self-pay

## 2019-06-12 ENCOUNTER — Encounter: Payer: Self-pay | Admitting: Internal Medicine

## 2019-06-12 ENCOUNTER — Ambulatory Visit: Payer: BLUE CROSS/BLUE SHIELD | Admitting: Nurse Practitioner

## 2019-06-12 VITALS — BP 134/78 | HR 88 | Temp 97.0°F | Ht 64.0 in | Wt 274.4 lb

## 2019-06-12 DIAGNOSIS — K219 Gastro-esophageal reflux disease without esophagitis: Secondary | ICD-10-CM

## 2019-06-12 DIAGNOSIS — R634 Abnormal weight loss: Secondary | ICD-10-CM | POA: Diagnosis not present

## 2019-06-12 DIAGNOSIS — R1030 Lower abdominal pain, unspecified: Secondary | ICD-10-CM

## 2019-06-12 NOTE — Assessment & Plan Note (Signed)
GERD symptoms currently doing better on AcipHex.  Occasional breakthrough symptoms.  Recommended Tums or Rolaids as needed for breakthrough.  Continue other current medications and follow-up in 4 months.

## 2019-06-12 NOTE — Assessment & Plan Note (Signed)
Continues to have lower abdominal pain and left-sided abdominal pain.  She has an appointment scheduled with gynecology because she is also been having some spotting as well associated with her pain.  Her left-sided abdominal pain, as per exam, with no major abnormalities felt possibly fibrous muscle tissue and/or lipoma.  CT imaging completed within the past couple months is reassuring with no acute findings.  I reviewed the CT imaging report with the patient for her reassurance.  Recommend she follow-up with primary care and gynecology as already scheduled.  Follow-up in 4 months at our office.

## 2019-06-12 NOTE — Assessment & Plan Note (Signed)
The patient is obese with a BMI of 47.10.  She continues to have some ongoing weight loss, objectively 9 pounds in the past 3 months.  She does have a thyroid nodule and has an appointment scheduled with ENT.  Her TSH has not been checked in a couple years.  We have done a pretty significant evaluation including colonoscopy, endoscopy, CT imaging.  Nothing that we have found would explain weight loss.  Recommend she follow-up with ENT as scheduled.  Also follow-up with primary care for other possible etiologies for unintentional weight loss.  Otherwise continue current medicines and follow-up in 4 months.

## 2019-06-12 NOTE — Progress Notes (Signed)
Referring Provider: Erasmo Downer, NP Primary Care Physician:  Erasmo Downer, NP Primary GI:  Dr. Jena Gauss  Chief Complaint  Patient presents with  . Gastroesophageal Reflux  . Abdominal Pain    left side, llq x 2 months    HPI:   Kristin [name] is a 38 y.o. female who presents for post procedure follow-up.  The patient was last seen in our office 03/18/2019 for GERD, change in stool habits, abnormal CT of the abdomen.  CT of the abdomen dated 02/13/2019 with soft tissue density near the GE junction and gaseous distention of the distal esophagus with remaining portions of the stomach are unremarkable.  No evidence of diverticulitis or colitis.  However, oral contrast was seen to the level of the splenic flexure with descending colon and splenic flexure decompressed and poorly evaluated.  Infectious or inflammatory colitis difficult to exclude and recommended correlation with history and exam.  Patient was also seen in the emergency department 12/30/2018 for chest pain which was deemed noncardiac after work-up.  It improved on Pepcid and she was given a prescription for Prilosec.  At her last visit she noted constipation with hard stools for which she was given MiraLAX which was minimally effective, Plavix was added, finally a Fleet enema which made no difference.  She took magnesium citrate which finally helped.  Currently only on Benefiber.  Bowel movement daily but passes easily.  GERD symptoms including globus sensation.  Prilosec found to be effective but still with globus sensation.  Solid food dysphagia prior to CT but not since.  Previous poor appetite and weight loss but this is improved since constipation improved.  Recommended increase Prilosec to 20 mg twice daily, EGD, colonoscopy, follow-up in 4 months.  She called our office in July to note no change in symptoms with Prilosec twice daily.  Recommended she could try 40 mg once daily.  She call back requesting a change and  so she was switched to Protonix 40 mg daily with request a progress report.  When she took pantoprazole she had heart racing and felt like she was having a panic attack and went to the emergency department.  He was also fasting at that time.  Recommended rechallenge of Protonix.  ER notes indicated doubt GI etiology and felt most likely related to fasting and dehydration.  Appears she may have switched back to omeprazole as well.  Colonoscopy completed 05/10/2019 which found entire colon normal.  Recommended repeat colonoscopy in 10 years.  EGD completed the same day found normal esophagus, hiatal hernia, otherwise normal.  Recommended trial of AcipHex 20 mg daily and follow-up in 3 months.  Today she states she's having a lot of issues. She's taking AcipHex which is generally working; today is a good day, yesterday wasn't so good and had breakthrough, not taking anything for breakthrough. She has a left thyroid nodule which is bothering her, is awaiting a call back on this today; previously recommended ENT follow-up. Having some left abdominal/midaxillary pain and has scheduled follow-up with OB/GYN due to this pain, lower abdominal pain, and spotting. Has a bowel movement daily, consistent with [place] 4. Denies hematochezia, melena, fever, chills. Still with weight loss (5 lbs in the last 2 weeks subjectively); objectively 9 lbs in the past 3 months. Has known thyroid nodule on imaging, has been a couple years since thyroid labs were completed.   Past Medical History:  Diagnosis Date  . Allergy   . Carpal tunnel syndrome of right  wrist 10/22/2018  . Contraceptive management 11/05/2013  . Depression   . GERD (gastroesophageal reflux disease)   . IUD (intrauterine device) in place 01/13/2016  . Migraines   . Missed periods 01/13/2016  . Vaginal Pap smear, abnormal     Past Surgical History:  Procedure Laterality Date  . COLONOSCOPY N/A 05/10/2019   Procedure: COLONOSCOPY;  Surgeon: Daneil Dolin, MD;   Location: [facility];  Service: Endoscopy;  Laterality: N/A;  1:00pm  . ESOPHAGOGASTRODUODENOSCOPY N/A 05/10/2019   Procedure: ESOPHAGOGASTRODUODENOSCOPY (EGD);  Surgeon: Daneil Dolin, MD;  Location: [facility];  Service: Endoscopy;  Laterality: N/A;  . NO PAST SURGERIES      Current Outpatient Medications  Medication Sig Dispense Refill  . amoxicillin (AMOXIL) 500 MG capsule 1 capsule 3 (three) times daily.    . cetirizine (ZYRTEC) 10 MG tablet Take 10 mg by mouth daily.    . cholecalciferol (VITAMIN D3) 25 MCG (1000 UT) tablet Take 1,000 Units by mouth daily.    . fluticasone (FLONASE) 50 MCG/ACT nasal spray Place 2 sprays into both nostrils daily.     . hydrOXYzine (ATARAX/VISTARIL) 10 MG tablet Take 10 mg by mouth 3 (three) times daily as needed for anxiety.     [name] ibuprofen (ADVIL) 800 MG tablet Take 800 mg by mouth every 8 (eight) hours as needed for headache.     . levonorgestrel (MIRENA) 20 MCG/24HR IUD 1 each by Intrauterine route once.    . Multiple Vitamins-Minerals (MULTIVITAMIN WITH MINERALS) tablet Take 1 tablet by mouth daily.    . RABEprazole (ACIPHEX) 20 MG tablet Take 20 mg by mouth daily.     . rizatriptan (MAXALT) 10 MG tablet TAKE 1 TABLET BY MOUTH ONCE AT ONSET OF SYMPTOMS, MAY REPEAT DOSE IN 2 HOURS IF NEEDED. (Patient taking differently: Take 10 mg by mouth See admin instructions. TAKE 1 TABLET BY MOUTH ONCE AT ONSET OF SYMPTOMS, MAY REPEAT DOSE IN 2 HOURS IF NEEDED.) 9 tablet 0  . vitamin C (ASCORBIC ACID) 500 MG tablet Take 500 mg by mouth daily.    . Wheat Dextrin (BENEFIBER PO) Take 1 Scoop by mouth daily. Once daily      No current facility-administered medications for this visit.     Allergies as of 06/12/2019  . (No Known Allergies)    Family History  Problem Relation Age of Onset  . Other Mother        enlarged heart  . Diabetes Mother   . Hypertension Mother   . Hyperlipidemia Mother   . Heart disease Mother        heart murmer  .  Miscarriages / [place] Mother   . Hypertension Father   . Hyperlipidemia Father   . Cancer Father        prostate  . Other Brother        enlarged heart; colloid cyst of the third ventricle( of the brain)  . Cancer Paternal Grandmother        leukemia  . Cancer Paternal Grandfather        lung  . Hypertension Sister   . Colon cancer Maternal Grandmother   . Colon cancer Maternal Aunt     Social History   Socioeconomic History  . Marital status: Divorced    Spouse name: Not on file  . Number of children: 3  . Years of education: 26  . Highest education level: Not on file  Occupational History  . Occupation: Administrator  Comment: [employer]  Social Needs  . Financial resource strain: Not on file  . Food insecurity    Worry: Not on file    Inability: Not on file  . Transportation needs    Medical: Not on file    Non-medical: Not on file  Tobacco Use  . Smoking status: Never Smoker  . Smokeless tobacco: Never Used  Substance and Sexual Activity  . Alcohol use: Not Currently    Comment: occasional  . Drug use: No  . Sexual activity: Not Currently    Birth control/protection: I.U.D.  Lifestyle  . Physical activity    Days per week: Not on file    Minutes per session: Not on file  . Stress: Not on file  Relationships  . Social Musicianconnections    Talks on phone: Not on file    Gets together: Not on file    Attends religious service: Not on file    Active member of club or organization: Not on file    Attends meetings of clubs or organizations: Not on file    Relationship status: Not on file  Other Topics Concern  . Not on file  Social History Narrative   Lives with parents   Looking for own place   Tries to walk daily    Review of Systems: General: Negative for anorexia, weight loss, fever, chills, fatigue, weakness. ENT: Negative for hoarseness, difficulty swallowing. CV: Negative for chest pain, angina, palpitations, peripheral edema.   Respiratory: Negative for dyspnea at rest, cough, sputum, wheezing.  GI: See history of present illness. GU:  Admits spotting recently, has f/u with GYN.  Endo: Negative for unusual weight change.  Heme: Negative for bruising or bleeding. Allergy: Negative for rash or hives.   Physical Exam: BP 134/78   Pulse 88   Temp (!) 97 F (36.1 C) (Oral)   Ht 5\' 4"  (1.626 m)   Wt 274 lb 6.4 oz (124.5 kg)   BMI 47.10 kg/m  General:   Alert and oriented. Pleasant and cooperative. Well-nourished and well-developed.  Eyes:  Without icterus, sclera clear and conjunctiva pink.  Ears:  Normal auditory acuity. Cardiovascular:  S1, S2 present without murmurs appreciated. Extremities without clubbing or edema. Respiratory:  Clear to auscultation bilaterally. No wheezes, rales, or rhonchi. No distress.  Gastrointestinal:  +BS, soft, and non-distended. Left-sided area of concern/tenderness felt fibrous, possibly small lipoma but no major abnormalities felt. No HSM noted. No guarding or rebound. No masses appreciated.  Rectal:  Deferred  Musculoskalatal:  Symmetrical without gross deformities. Skin:  Intact without significant lesions or rashes. Neurologic:  Alert and oriented x4;  grossly normal neurologically. Psych:  Alert and cooperative. Normal mood and affect. Heme/Lymph/Immune: No excessive bruising noted.    06/12/2019 2:23 PM   Disclaimer: This note was dictated with voice recognition software. Similar sounding words can inadvertently be transcribed and may not be corrected upon review.

## 2019-06-12 NOTE — Patient Instructions (Signed)
Your health issues we discussed today were:   GERD (reflux/heartburn): 1. Continue to take AcipHex as you have been 2. You can use Tums or Rolaids for "breakthrough" reflux symptoms despite taking AcipHex 3. Call us if you have any worsening or severe symptoms  Abdominal pain: 1. As we discussed, I did not feel anything significantly major at the left side area of discomfort 2. Keep your appointment with gynecology to evaluate your abdominal pain in addition to spotting 3. Your CT imaging look normal, which is great news and very reassuring! 4. Bolus if you have any worsening or severe symptoms  Weight loss: 1. We have done several things to evaluate for possible causes of weight loss including colonoscopy, endoscopy, and CT scan of your abdomen and pelvis. 2. It is reassuring that we have found no significant tumors, masses, other concerning causes for weight loss 3. Follow-up with ENT related to your thyroid nodule and possible thyroid labs testing 4. Follow-up with your primary care so they can evaluate for other possible causes of weight loss besides a GI cause 5. Call us if you have any worsening or severe symptoms or problems  Overall I recommend:  1. Continue your other current medications 2. Return for follow-up in 4 months 3. Call us if you have any questions or concerns   Because of recent events of COVID-19 ("Coronavirus"), follow CDC recommendations:  Wash your hand frequently Avoid touching your face Stay away from people who are sick If you have symptoms such as fever, cough, shortness of breath then call your healthcare provider for further guidance If you are sick, STAY AT HOME unless otherwise directed by your healthcare provider. Follow directions from state and national officials regarding staying safe   At [facility] we value your feedback. You may receive a survey about your visit today. Please share your experience as we strive to create  trusting relationships with our patients to provide genuine, compassionate, quality care.  We appreciate your understanding and patience as we review any laboratory studies, imaging, and other diagnostic tests that are ordered as we care for you. Our office policy is 5 business days for review of these results, and any emergent or urgent results are addressed in a timely manner for your best interest. If you do not hear from our office in 1 week, please contact us.   We also encourage the use of MyChart, which contains your medical information for your review as well. If you are not enrolled in this feature, an access code is on this after visit summary for your convenience. Thank you for allowing [place] to be involved in your care.  It was great to see you today!  I hope you have a great Fall!!

## 2019-06-17 ENCOUNTER — Encounter: Payer: Self-pay | Admitting: Family Medicine

## 2019-06-17 ENCOUNTER — Ambulatory Visit (INDEPENDENT_AMBULATORY_CARE_PROVIDER_SITE_OTHER): Payer: BLUE CROSS/BLUE SHIELD | Admitting: Family Medicine

## 2019-06-17 ENCOUNTER — Other Ambulatory Visit: Payer: Self-pay

## 2019-06-17 VITALS — BP 119/69 | HR 78 | Ht 64.0 in | Wt 275.4 lb

## 2019-06-17 DIAGNOSIS — N898 Other specified noninflammatory disorders of vagina: Secondary | ICD-10-CM | POA: Diagnosis not present

## 2019-06-17 DIAGNOSIS — R1032 Left lower quadrant pain: Secondary | ICD-10-CM

## 2019-06-17 NOTE — Progress Notes (Signed)
    GYNECOLOGY PROBLEM  VISIT ENCOUNTER NOTE  Subjective:  Kristin [name] is a 38 y.o. G12P3003 female here for a problem GYN visit.    Chief Complaint  Patient presents with  . pain with IUD    vaginal itching; ? yeast infection     Vaginal discharge and irritation--was on abx. Using monostat sx, improving  LLQ pain- present for 2 months, non woresening. Associated with spotting that is intermittent. R  Denies abnormal vaginal bleeding, discharge, pelvic pain, problems with intercourse or other gynecologic concerns.    Gynecologic History No LMP recorded. (Menstrual status: IUD). Contraception: IUD  Health Maintenance Due  Topic Date Due  . MAMMOGRAM  10/07/1996  . INFLUENZA VACCINE  05/04/2019     The following portions of the patient's history were reviewed and updated as appropriate: allergies, current medications, past family history, past medical history, past social history, past surgical history and problem list.  Review of Systems Pertinent items noted in HPI and remainder of comprehensive ROS otherwise negative.   Objective:  BP 119/69 (BP Location: Right Arm, Patient Position: Sitting, Cuff Size: Large)   Pulse 78   Ht 5\' 4"  (1.626 m)   Wt 275 lb 6.4 oz (124.9 kg)   BMI 47.27 kg/m  Gen: well appearing, NAD HEENT: no scleral icterus CV: RR Lung: Normal WOB Ext: warm well perfused  PELVIC: Normal appearing external genitalia; normal appearing vaginal mucosa and cervix.  No abnormal discharge noted.  .  Normal uterine size, no other palpable masses, no uterine tenderness. TTP of left adnexa, possible cystic structure felt on bimanual.    Assessment and Plan:   1. Left lower quadrant pain Possible ovarian cyst - US PELVIS (TRANSABDOMINAL ONLY); Future  2. Vaginal discharge Continue monostat, if sx return or worsen return for visit  Please refer to After Visit Summary for other counseling recommendations.   Return in about 2 weeks (around  07/01/2019) for [place] for ovarian cyst.  Caren Macadam, MD, MPH, ABFM Attending [place] for [facility]

## 2019-06-24 ENCOUNTER — Ambulatory Visit: Payer: BLUE CROSS/BLUE SHIELD | Admitting: Orthopedic Surgery

## 2019-06-28 ENCOUNTER — Other Ambulatory Visit: Payer: Self-pay | Admitting: Family Medicine

## 2019-06-28 DIAGNOSIS — R1032 Left lower quadrant pain: Secondary | ICD-10-CM

## 2019-07-01 ENCOUNTER — Other Ambulatory Visit: Payer: BLUE CROSS/BLUE SHIELD

## 2019-07-03 ENCOUNTER — Other Ambulatory Visit: Payer: BLUE CROSS/BLUE SHIELD

## 2019-07-04 ENCOUNTER — Other Ambulatory Visit (HOSPITAL_COMMUNITY): Payer: Self-pay | Admitting: Nurse Practitioner

## 2019-07-04 ENCOUNTER — Other Ambulatory Visit: Payer: Self-pay | Admitting: Nurse Practitioner

## 2019-07-04 DIAGNOSIS — R591 Generalized enlarged lymph nodes: Secondary | ICD-10-CM

## 2019-07-08 ENCOUNTER — Ambulatory Visit: Payer: BLUE CROSS/BLUE SHIELD | Admitting: Orthopedic Surgery

## 2019-07-09 ENCOUNTER — Other Ambulatory Visit: Payer: Self-pay

## 2019-07-09 ENCOUNTER — Ambulatory Visit (INDEPENDENT_AMBULATORY_CARE_PROVIDER_SITE_OTHER): Payer: BLUE CROSS/BLUE SHIELD

## 2019-07-09 DIAGNOSIS — R1032 Left lower quadrant pain: Secondary | ICD-10-CM | POA: Diagnosis not present

## 2019-07-09 NOTE — Progress Notes (Signed)
PELVIC US TA/TV: heterogeneous anteverted uterus,small posterior intramural fibroid 9 x 6 x 6 mm,IUD is centrally located with in the endometrium,EEC 3.3 mm,normal ovaries bilat,ovaries appear mobile,left adnexal pain during [place]

## 2019-07-22 ENCOUNTER — Other Ambulatory Visit: Payer: Self-pay

## 2019-07-22 ENCOUNTER — Telehealth: Payer: Self-pay | Admitting: *Deleted

## 2019-07-22 ENCOUNTER — Ambulatory Visit (HOSPITAL_COMMUNITY)
Admission: RE | Admit: 2019-07-22 | Discharge: 2019-07-22 | Disposition: A | Discharge status: Home / Self Care | Payer: BLUE CROSS/BLUE SHIELD | Source: Ambulatory Visit | Attending: Nurse Practitioner | Admitting: Nurse Practitioner

## 2019-07-22 DIAGNOSIS — R591 Generalized enlarged lymph nodes: Secondary | ICD-10-CM | POA: Insufficient documentation

## 2019-07-22 MED ORDER — IOHEXOL 300 MG/ML  SOLN
75.0000 mL | Freq: Once | INTRAMUSCULAR | Status: AC | PRN
  Administered 2019-07-22: 75 mL via INTRAVENOUS

## 2019-07-22 NOTE — Telephone Encounter (Signed)
Pt informed of ultrasound results. No other questions or concerns at this time.

## 2019-07-24 ENCOUNTER — Ambulatory Visit: Payer: BLUE CROSS/BLUE SHIELD | Admitting: Orthopedic Surgery

## 2019-07-24 ENCOUNTER — Encounter: Payer: Self-pay | Admitting: Orthopedic Surgery

## 2019-08-12 ENCOUNTER — Ambulatory Visit (INDEPENDENT_AMBULATORY_CARE_PROVIDER_SITE_OTHER): Payer: BLUE CROSS/BLUE SHIELD | Admitting: Otolaryngology

## 2019-08-12 ENCOUNTER — Other Ambulatory Visit: Payer: Self-pay

## 2019-08-12 DIAGNOSIS — R221 Localized swelling, mass and lump, neck: Secondary | ICD-10-CM

## 2019-08-12 DIAGNOSIS — R590 Localized enlarged lymph nodes: Secondary | ICD-10-CM

## 2019-08-12 DIAGNOSIS — K219 Gastro-esophageal reflux disease without esophagitis: Secondary | ICD-10-CM

## 2019-08-13 ENCOUNTER — Ambulatory Visit: Payer: BLUE CROSS/BLUE SHIELD | Admitting: Gastroenterology

## 2019-09-05 ENCOUNTER — Emergency Department (HOSPITAL_COMMUNITY)
Admission: EM | Admit: 2019-09-05 | Discharge: 2019-09-05 | Disposition: A | Discharge status: Home / Self Care | Payer: BLUE CROSS/BLUE SHIELD | Attending: Emergency Medicine | Admitting: Emergency Medicine

## 2019-09-05 ENCOUNTER — Emergency Department (HOSPITAL_COMMUNITY): Payer: BLUE CROSS/BLUE SHIELD

## 2019-09-05 ENCOUNTER — Encounter (HOSPITAL_COMMUNITY): Payer: Self-pay

## 2019-09-05 ENCOUNTER — Other Ambulatory Visit: Payer: Self-pay

## 2019-09-05 DIAGNOSIS — K219 Gastro-esophageal reflux disease without esophagitis: Secondary | ICD-10-CM | POA: Insufficient documentation

## 2019-09-05 DIAGNOSIS — R0789 Other chest pain: Secondary | ICD-10-CM | POA: Diagnosis not present

## 2019-09-05 DIAGNOSIS — Z79899 Other long term (current) drug therapy: Secondary | ICD-10-CM | POA: Insufficient documentation

## 2019-09-05 DIAGNOSIS — Z975 Presence of (intrauterine) contraceptive device: Secondary | ICD-10-CM | POA: Diagnosis not present

## 2019-09-05 HISTORY — DX: Anxiety disorder, unspecified: F41.9

## 2019-09-05 LAB — CBC
HCT: 37.8 % (ref 36.0–46.0)
Hemoglobin: 11.8 g/dL — ABNORMAL LOW (ref 12.0–15.0)
MCH: 27 pg (ref 26.0–34.0)
MCHC: 31.2 g/dL (ref 30.0–36.0)
MCV: 86.5 fL (ref 80.0–100.0)
Platelets: 273 10*3/uL (ref 150–400)
RBC: 4.37 MIL/uL (ref 3.87–5.11)
RDW: 13.2 % (ref 11.5–15.5)
WBC: 6.5 10*3/uL (ref 4.0–10.5)
nRBC: 0 % (ref 0.0–0.2)

## 2019-09-05 LAB — TROPONIN I (HIGH SENSITIVITY)
Troponin I (High Sensitivity): <2 ng/L (ref <18)
Troponin I (High Sensitivity): <2 ng/L (ref <18)

## 2019-09-05 LAB — BASIC METABOLIC PANEL
Anion gap: 7 (ref 5–15)
BUN: 6 mg/dL (ref 6–20)
CO2: 29 mmol/L (ref 22–32)
Calcium: 9 mg/dL (ref 8.9–10.3)
Chloride: 103 mmol/L (ref 98–111)
Creatinine, Ser: 0.56 mg/dL (ref 0.44–1.00)
GFR calc Af Amer: >60 mL/min (ref >60)
GFR calc non Af Amer: >60 mL/min (ref >60)
Glucose, Bld: 94 mg/dL (ref 70–99)
Potassium: 3.5 mmol/L (ref 3.5–5.1)
Sodium: 139 mmol/L (ref 135–145)

## 2019-09-05 MED ORDER — ACETAMINOPHEN 325 MG PO TABS
650.0000 mg | ORAL_TABLET | Freq: Once | ORAL | Status: AC
  Administered 2019-09-05: 650 mg via ORAL
  Filled 2019-09-05: qty 2

## 2019-09-05 MED ORDER — ALUM & MAG HYDROXIDE-SIMETH 200-200-20 MG/5ML PO SUSP
30.0000 mL | Freq: Once | ORAL | Status: AC
  Administered 2019-09-05: 30 mL via ORAL
  Filled 2019-09-05: qty 30

## 2019-09-05 NOTE — ED Triage Notes (Addendum)
Pt reports that that her chest has been hurting off and on 3 weeks. Kids were  Positive for covid on the 11th and quarantined.  Pt never tested positive. Thinks possibly reflux. Pt had held anxiety meds due to chest pain and low HR. Pain is also in the middle of her back

## 2019-09-05 NOTE — ED Notes (Signed)
Patient transported to X-ray 

## 2019-09-05 NOTE — ED Provider Notes (Signed)
[facility] EMERGENCY DEPARTMENT Provider Note   CSN: [id number] Arrival date & time: 09/05/19  1007     History   Chief Complaint Chief Complaint  Patient presents with  . Chest Pain    HPI Kristin [name] is a 38 y.o. female with a history of anxiety, GERD, migraine headaches presenting with a 3-week history of intermittent mid sternal chest pain which radiates into her back and is associated with exertional shortness of breath which is relieved by rest.  She denies fevers or chills, cough, no abdominal pain, nausea or vomiting, no palpitations.  Her pain is described as "burning like", but different from her normal acid reflux disease.  Her family and she have been in quarantine for the past 20 days as 2 of her children have tested positive for COVID-19.  She has been tested 3 times, most recently on November 17, all 3 tests have been negative.  Her symptoms are worsened with increased episodes of stress.  She has found no alleviators for her symptoms.  Also denies dizziness, headache, has had no peripheral swelling or pain.  She wears a fit bit and also states her pulse has been dipping into the 50s which is unusual for her, she generally remains in the 60s and 70s for heart rate.  She discussed this with her PCP who advised an ED visit.  No family history of early cardiac history.  She is currently symptom-free.  She has taken 2 baby aspirin prior to arrival.  HPI: A 29 year old patient with a history of obesity presents for evaluation of chest pain. Initial onset of pain was more than 6 hours ago. The patient's chest pain is worse with exertion. The patient's chest pain is middle- or left-sided, is not well-localized, is not described as heaviness/pressure/tightness, is not sharp and does not radiate to the arms/jaw/neck. The patient does not complain of nausea and denies diaphoresis. The patient has no history of stroke, has no history of peripheral artery disease, has not smoked in the  past 90 days, denies any history of treated diabetes, has no relevant family history of coronary artery disease (first degree relative at less than age 54), is not hypertensive and has no history of hypercholesterolemia.   The history is provided by the patient.    Past Medical History:  Diagnosis Date  . Allergy   . Anxiety   . Carpal tunnel syndrome of right wrist 10/22/2018  . Contraceptive management 11/05/2013  . Depression   . GERD (gastroesophageal reflux disease)   . IUD (intrauterine device) in place 01/13/2016  . Migraines   . Missed periods 01/13/2016  . Vaginal Pap smear, abnormal     Patient Active Problem List   Diagnosis Date Noted  . Loss of weight 06/12/2019  . Boil of groin 05/03/2019  . Lower abdominal pain 04/19/2019  . Pain with urination 04/19/2019  . GERD (gastroesophageal reflux disease) 03/18/2019  . Change in stool habits 03/18/2019  . Abnormal CT of the abdomen 03/18/2019  . Boil, leg 03/04/2019  . Screening for colorectal cancer 10/22/2018  . Screening for STDs (sexually transmitted diseases) 10/22/2018  . Encounter for gynecological examination with Papanicolaou smear of cervix 10/22/2018  . Family planning 10/22/2018  . Plantar fasciitis, right 10/22/2018  . Carpal tunnel syndrome of right wrist 10/22/2018  . IUD (intrauterine device) in place 01/13/2016  . Migraines 03/07/2013    Past Surgical History:  Procedure Laterality Date  . COLONOSCOPY N/A 05/10/2019   Procedure: COLONOSCOPY;  Surgeon:  Rourk, Cristopher Estimable, MD;  Location: [facility] ENDO SUITE;  Service: Endoscopy;  Laterality: N/A;  1:00pm  . ESOPHAGOGASTRODUODENOSCOPY N/A 05/10/2019   Procedure: ESOPHAGOGASTRODUODENOSCOPY (EGD);  Surgeon: Daneil Dolin, MD;  Location: [facility] ENDO [facility]ITE;  Service: Endoscopy;  Laterality: N/A;  . NO PAST SURGERIES       OB History    Gravida  3   Para  3   Term  3   Preterm      AB      Living  3     SAB      TAB      Ectopic      Multiple  0   Live  Births  3            Home Medications    Prior to Admission medications   Medication Sig Start Date End Date Taking? Authorizing Provider  cetirizine (ZYRTEC) 10 MG tablet Take 10 mg by mouth daily.    [provider]  cholecalciferol (VITAMIN D3) 25 MCG (1000 UT) tablet Take 1,000 Units by mouth daily.    [provider]  citalopram (CELEXA) 20 MG tablet daily.  05/31/19   [provider]  fluticasone (FLONASE) 50 MCG/ACT nasal spray Place 2 sprays into both nostrils daily.  12/25/18   [provider]  hydrOXYzine (ATARAX/VISTARIL) 10 MG tablet Take 10 mg by mouth 3 (three) times daily as needed for anxiety.     [provider]  ibuprofen (ADVIL) 800 MG tablet Take 800 mg by mouth every 8 (eight) hours as needed for headache.  05/03/19   [provider]  levonorgestrel (MIRENA) 20 MCG/24HR IUD 1 each by Intrauterine route once.    [provider]  Multiple Vitamins-Minerals (MULTIVITAMIN WITH MINERALS) tablet Take 1 tablet by mouth daily.    [provider]  RABEprazole (ACIPHEX) 20 MG tablet Take 20 mg by mouth daily.  05/10/19   [provider]  rizatriptan (MAXALT) 10 MG tablet TAKE 1 TABLET BY MOUTH ONCE AT ONSET OF SYMPTOMS, MAY REPEAT DOSE IN 2 HOURS IF NEEDED. Patient taking differently: Take 10 mg by mouth See admin instructions. TAKE 1 TABLET BY MOUTH ONCE AT ONSET OF SYMPTOMS, MAY REPEAT DOSE IN 2 HOURS IF NEEDED. 01/15/18   Raylene Everts, MD  vitamin C (ASCORBIC ACID) 500 MG tablet Take 500 mg by mouth daily.    [provider]  Wheat Dextrin (BENEFIBER PO) Take 1 Scoop by mouth daily. Once daily     [provider]    Family History Family History  Problem Relation Age of Onset  . Other Mother        enlarged heart  . Diabetes Mother   . Hypertension Mother   . Hyperlipidemia Mother   . Heart disease Mother        heart murmer  . Miscarriages / [place] Mother   .  Hypertension Father   . Hyperlipidemia Father   . Cancer Father        prostate  . Other Brother        enlarged heart; colloid cyst of the third ventricle( of the brain)  . Cancer Paternal Grandmother        leukemia  . Cancer Paternal Grandfather        lung  . Hypertension Sister   . Colon cancer Maternal Grandmother   . Colon cancer Maternal Aunt     Social History Social History   Tobacco Use  .  Smoking status: Never Smoker  . Smokeless tobacco: Never Used  Substance Use Topics  . Alcohol use: Not Currently    Comment: occasional  . Drug use: No     Allergies   Patient has no known allergies.   Review of Systems Review of Systems  Constitutional: Negative for chills and fever.  HENT: Negative for congestion and sore throat.   Eyes: Negative.   Respiratory: Positive for shortness of breath. Negative for chest tightness.   Cardiovascular: Positive for chest pain. Negative for palpitations and leg swelling.  Gastrointestinal: Negative for abdominal pain and nausea.  Genitourinary: Negative.   Musculoskeletal: Positive for back pain. Negative for arthralgias, joint swelling and neck pain.  Skin: Negative.  Negative for rash and wound.  Neurological: Negative for dizziness, weakness, light-headedness, numbness and headaches.  Psychiatric/Behavioral: Negative.      Physical Exam Updated Vital Signs BP 129/80   Pulse 73   Temp 98.5 F (36.9 C) (Oral)   Resp 20   Ht 5\' 4"  (1.626 m)   Wt 124.7 kg   SpO2 96%   BMI 47.20 kg/m   Physical Exam Vitals signs and nursing note reviewed.  Constitutional:      Appearance: She is well-developed.  HENT:     Head: Normocephalic and atraumatic.  Eyes:     Conjunctiva/sclera: Conjunctivae normal.  Neck:     Musculoskeletal: Normal range of motion.  Cardiovascular:     Rate and Rhythm: Normal rate and regular rhythm.     Heart sounds: Normal heart sounds. No murmur.  Pulmonary:     Effort: Pulmonary effort is  normal. No tachypnea.     Breath sounds: Normal breath sounds. No wheezing, rhonchi or rales.  Chest:     Chest wall: No tenderness.  Abdominal:     General: Bowel sounds are normal.     Palpations: Abdomen is soft.     Tenderness: There is no abdominal tenderness.  Musculoskeletal: Normal range of motion.     Right lower leg: She exhibits no tenderness. No edema.     Left lower leg: She exhibits no tenderness. No edema.  Skin:    General: Skin is warm and dry.  Neurological:     General: No focal deficit present.     Mental Status: She is alert.      ED Treatments / Results  Labs (all labs ordered are listed, but only abnormal results are displayed) Labs Reviewed  CBC - Abnormal; Notable for the following components:      Result Value   Hemoglobin 11.8 (*)    All other components within normal limits  BASIC METABOLIC PANEL  TROPONIN I (HIGH SENSITIVITY)  TROPONIN I (HIGH SENSITIVITY)    EKG EKG Interpretation  Date/Time:  Thursday September 05 2019 10:47:33 EST Ventricular Rate:  66 PR Interval:    QRS Duration: 103 QT Interval:  424 QTC Calculation: 445 R Axis:   76 Text Interpretation: Sinus arrhythmia Confirmed by Donnetta Hutchingook, Brian ([id number]) on 09/05/2019 11:17:01 AM   Radiology Dg Chest 2 View  Result Date: 09/05/2019 CLINICAL DATA:  38 year old female with history of mid chest pain radiating through the back intermittently for the past 3 weeks. EXAM: CHEST - 2 VIEW COMPARISON:  Chest x-ray 07/22/2019. FINDINGS: Lung volumes are normal. No consolidative airspace disease. No pleural effusions. No pneumothorax. No pulmonary nodule or mass noted. Pulmonary vasculature and the cardiomediastinal silhouette are within normal limits. IMPRESSION: No radiographic evidence of acute cardiopulmonary disease. Electronically Signed  By: Trudie Reed M.D.   On: 09/05/2019 11:33    Procedures Procedures (including critical care time)  Medications Ordered in ED Medications   alum & mag hydroxide-simeth (MAALOX/MYLANTA) 200-200-20 MG/5ML suspension 30 mL (has no administration in time range)     Initial Impression / Assessment and Plan / ED Course  I have reviewed the triage vital signs and the nursing notes.  Pertinent labs & imaging results that were available during my care of the patient were reviewed by me and considered in my medical decision making (see chart for details).     HEAR Score: 2  Pt with low heart score and negative delta troponins. Sx free at dc.  Given burning pain with radiation into back, favor worsened gerd.  On ranitidine at baseline.  Advised may add maalox or mylanta prn.   Has appt with pcp in 4 days.    Final Clinical Impressions(s) / ED Diagnoses   Final diagnoses:  Atypical chest pain  Gastroesophageal reflux disease, unspecified whether esophagitis present    ED Discharge Orders    None       Victoriano Lain 09/05/19 1526    Donnetta Hutching, MD 09/06/19 [phone]

## 2019-09-05 NOTE — Discharge Instructions (Addendum)
Your lab tests, ekg and xrays are reassuring today.  I suspect your symptoms may be from worsened of your acid reflux, given the burning nature of pain and the radiation into your back.  You may add maalox or mylanta, (or tums) for any break through symptoms.  Keep your appointment with your PCP on Monday as planned.

## 2019-09-05 NOTE — ED Notes (Signed)
Provider at bedside

## 2019-09-30 NOTE — Progress Notes (Signed)
CARDIOLOGY CONSULT NOTE       Patient ID: Kristin [name] MRN: [id number] DOB/AGE: 40/02/1981 40 y.o.  Admit date: (Not on file) Referring Physician: Burgess AmorJulie Idol PA-c [facility]  Primary Physician: Erasmo DownerStrader, Lindsey F, NP Primary Cardiologist: Eden EmmsNishan Reason for Consultation: Chest Pain   Active Problems:   * No active hospital problems. *   HPI:  38 y.o. with history of anxiety palpitations / GERD, migraines Seen in [facility] ED 09/05/19 with atypical chest pain. Pain intermittently for 3 weeks sternal radiating to back associated with some exertional dyspnea Although pain described as burning not like her usual reflux Had been in quarantine for 20 days as two of her children had COVID but she has tested negative multiple times She had no acute ECG changes, negative troponin x 2 and normal CXR  Labs otherwise unremarkable She was seen by cardiology August 2018 with benign palpitations related to stress and no testing done Given PRN inderal and told to be seen as needed  She has been seeing Rx and doing relaxation techniques and feels better Works from [employer] and has two kids with lots of stress. No chest pain when anxiety better. Has twin sister in [place] who has one 283 yo. Has younger sister nearby with 2 children who lives with her parents. She is divorced and ex has kids on weekends at times  ROS All other systems reviewed and negative except as noted above  Past Medical History:  Diagnosis Date  . Allergy   . Anxiety   . Carpal tunnel syndrome of right wrist 10/22/2018  . Contraceptive management 11/05/2013  . Depression   . GERD (gastroesophageal reflux disease)   . IUD (intrauterine device) in place 01/13/2016  . Migraines   . Missed periods 01/13/2016  . Vaginal Pap smear, abnormal     Family History  Problem Relation Age of Onset  . Other Mother        enlarged heart  . Diabetes Mother   . Hypertension Mother   . Hyperlipidemia Mother   . Heart disease Mother        heart murmer   . Miscarriages / [place] Mother   . Hypertension Father   . Hyperlipidemia Father   . Cancer Father        prostate  . Other Brother        enlarged heart; colloid cyst of the third ventricle( of the brain)  . Cancer Paternal Grandmother        leukemia  . Cancer Paternal Grandfather        lung  . Hypertension Sister   . Colon cancer Maternal Grandmother   . Colon cancer Maternal Aunt     Social History   Socioeconomic History  . Marital status: Divorced    Spouse name: Not on file  . Number of children: 3  . Years of education: 6514  . Highest education level: Not on file  Occupational History  . Occupation: Public house managerprogram assistant    Comment: [employer]  Tobacco Use  . Smoking status: Never Smoker  . Smokeless tobacco: Never Used  Substance and Sexual Activity  . Alcohol use: Not Currently    Comment: occasional  . Drug use: No  . Sexual activity: Not Currently    Birth control/protection: I.U.D.  Other Topics Concern  . Not on file  Social History Narrative   Lives with parents   Looking for own place   Tries to walk daily   Social Determinants of Health  Financial Resource Strain:   . Difficulty of Paying Living Expenses: Not on file  Food Insecurity:   . Worried About Programme researcher, broadcasting/film/video in the Last Year: Not on file  . Ran Out of Food in the Last Year: Not on file  Transportation Needs:   . Lack of Transportation (Medical): Not on file  . Lack of Transportation (Non-Medical): Not on file  Physical Activity:   . Days of Exercise per Week: Not on file  . Minutes of Exercise per Session: Not on file  Stress:   . Feeling of Stress : Not on file  Social Connections:   . Frequency of Communication with Friends and Family: Not on file  . Frequency of Social Gatherings with Friends and Family: Not on file  . Attends Religious Services: Not on file  . Active Member of Clubs or Organizations: Not on file  . Attends Banker Meetings: Not on file   . Marital Status: Not on file  Intimate Partner Violence:   . Fear of Current or Ex-Partner: Not on file  . Emotionally Abused: Not on file  . Physically Abused: Not on file  . Sexually Abused: Not on file    Past Surgical History:  Procedure Laterality Date  . COLONOSCOPY N/A 05/10/2019   Procedure: COLONOSCOPY;  Surgeon: Corbin Ade, MD;  Location: [facility];  Service: Endoscopy;  Laterality: N/A;  1:00pm  . ESOPHAGOGASTRODUODENOSCOPY N/A 05/10/2019   Procedure: ESOPHAGOGASTRODUODENOSCOPY (EGD);  Surgeon: Corbin Ade, MD;  Location: [facility];  Service: Endoscopy;  Laterality: N/A;  . NO PAST SURGERIES          Physical Exam: Blood pressure 120/79, pulse 69, temperature 98 F (36.7 C), temperature source Temporal, height 5\' 4"  (1.626 m), weight 275 lb (124.7 kg).   Affect appropriate Overweight black female  HEENT: normal Neck supple with no adenopathy JVP normal no bruits no thyromegaly Lungs clear with no wheezing and good diaphragmatic motion Heart:  S1/S2 no murmur, no rub, gallop or click PMI normal Abdomen: benighn, BS positve, no tenderness, no AAA no bruit.  No HSM or HJR Distal pulses intact with no bruits No edema Neuro non-focal Skin warm and dry No muscular weakness   Labs:   Lab Results  Component Value Date   WBC 6.5 09/05/2019   HGB 11.8 (L) 09/05/2019   HCT 37.8 09/05/2019   MCV 86.5 09/05/2019   PLT 273 09/05/2019   No results for input(s): NA, K, CL, CO2, BUN, CREATININE, CALCIUM, PROT, BILITOT, ALKPHOS, ALT, AST, GLUCOSE in the last 168 hours.  Invalid input(s): LABALBU Lab Results  Component Value Date   TROPONINI <0.03 12/30/2018    Lab Results  Component Value Date   CHOL 158 06/23/2017   Lab Results  Component Value Date   HDL 49 (L) 06/23/2017   Lab Results  Component Value Date   LDLCALC 94 06/23/2017   Lab Results  Component Value Date   TRIG 66 06/23/2017   Lab Results  Component Value Date   CHOLHDL  3.2 06/23/2017   No results found for: LDLDIRECT    Radiology: No results found.  EKG: sinus arrhythmia rate 66 normal    ASSESSMENT AND PLAN:   1. Chest Pain:  Atypical with negative ER w/u including troponin , CXR and ECG. No significant risk factors observe no indication for testing as symptoms improved as anxiety improved  2. Palpitations: benign no further w/u  3. Migraines :  As  needed Maxalt  4. Anxiety/Depression:  Divorced with 3 kids recent COVID infection in 2 children continue Celexa 5. GERD:  Continue aciphex and PRN mylanta low carb diet and weight loss advised   Signed: Jenkins Rouge 10/07/2019, 9:33 AM

## 2019-10-07 ENCOUNTER — Other Ambulatory Visit: Payer: Self-pay

## 2019-10-07 ENCOUNTER — Encounter: Payer: Self-pay | Admitting: Cardiovascular Disease

## 2019-10-07 ENCOUNTER — Ambulatory Visit: Payer: BLUE CROSS/BLUE SHIELD | Admitting: Cardiovascular Disease

## 2019-10-07 VITALS — BP 120/79 | HR 69 | Temp 98.0°F | Ht 64.0 in | Wt 275.0 lb

## 2019-10-07 DIAGNOSIS — R079 Chest pain, unspecified: Secondary | ICD-10-CM

## 2019-10-07 NOTE — Patient Instructions (Signed)

## 2019-10-10 DIAGNOSIS — M542 Cervicalgia: Secondary | ICD-10-CM | POA: Insufficient documentation

## 2019-10-16 ENCOUNTER — Ambulatory Visit: Payer: BLUE CROSS/BLUE SHIELD | Admitting: Nurse Practitioner

## 2019-10-16 ENCOUNTER — Other Ambulatory Visit: Payer: Self-pay

## 2019-10-16 ENCOUNTER — Encounter: Payer: Self-pay | Admitting: Nurse Practitioner

## 2019-10-16 VITALS — BP 118/76 | HR 72 | Temp 97.3°F | Ht 64.0 in | Wt 274.4 lb

## 2019-10-16 DIAGNOSIS — K219 Gastro-esophageal reflux disease without esophagitis: Secondary | ICD-10-CM | POA: Diagnosis not present

## 2019-10-16 DIAGNOSIS — R634 Abnormal weight loss: Secondary | ICD-10-CM

## 2019-10-16 NOTE — Assessment & Plan Note (Signed)
GERD symptoms doing much better on AcipHex.  She does use Tums or other substances such as milk or bananas as needed for breakthrough symptoms.  Overall she has improved.  She is satisfied at this time.  Recommend she continue her current medications and follow-up in 6 months.  Call for any worsening or severe symptoms.

## 2019-10-16 NOTE — Progress Notes (Signed)
Referring Provider: Erasmo Downer, NP Primary Care Physician:  Erasmo Downer, NP Primary GI:  Dr. Jena Gauss  Chief Complaint  Patient presents with  . Gastroesophageal Reflux    HPI:   Kristin [name] is a 39 y.o. female who presents for follow-up on abdominal pain and GERD.  Patient was last seen in our office 06/12/2019 for GERD, lower abdominal pain, weight loss.  A previous CT abdomen dated 02/13/2019 with soft tissue density near the GE junction gaseous distention of the distal esophagus with remaining portions of the stomach unremarkable.  However, oral contrast was seen to the level of the splenic flexure with descending colon and splenic flexure decompressed and poorly evaluated with infectious or inflammatory colitis difficult to exclude and recommended correlation with history and exam.  Also noted history of constipation with MiraLAX minimally effective, relying on Fleet enema and magnesium citrate.  A previous recommendation for EGD and colonoscopy.  Previous ER evaluation for chest pain deemed noncardiac and improved with Pepcid and Prilosec.  Colonoscopy 05/10/2019 with entire colon normal and recommended repeat in 10 years.  EGD the same day found normal esophagus, hiatal hernia, otherwise normal.  Recommended trial of AcipHex 20 mg daily.  At her last visit she was taking AcipHex which is generally working.  Had breakthrough symptoms yesterday and not currently taking anything for this.  Left thyroid nodule is bothering her and awaiting callback from ENT for follow-up.  Some left abdominal/mid axillary pain schedule follow-up with OB/GYN.  Also with lower abdominal pain and spotting.  Bowel movement daily consistent with [place] 4.  Has lost 5 pounds in 2 weeks and objectively 9 pounds in the past 3 months.  No recent labs related to known thyroid nodule on imaging.  No other overt GI complaints.  Recommended continue AcipHex, use Tums or Rolaids for breakthrough symptoms,  keep appointment with OB/GYN, follow-up with ENT, follow-up with primary care for further evaluation of weight loss due to no findings on colonoscopy/EGD/CT exam.  Follow-up in our office in 4 months.  Today she states she's doing ok overall. GERD symptoms better overall on AciPhex. Has been using OTC TUMS or milk/bananas for occasional breakthrough symptoms. Denies solid food dysphagia. Has been drinking more water. Intermittent LUQ pain with drinking fluids which is very brief and self-resolves but notes she "chugs water" and needs to probably slow down. Denies other abdominal pain, N/V, hematochezia, melena, fever, chills, unintentional weight loss. Denies URI or flu-like symptoms. Denies loss of sense of taste or smell. Denies chest pain, dyspnea, dizziness, lightheadedness, syncope, near syncope. Denies any other upper or lower GI symptoms.  States in November her two children (5 y.o. and 37 y.o.) tested COVID +; the patient had loss of taste/smell, aches, fever but tested negative x 2.  Past Medical History:  Diagnosis Date  . Allergy   . Anxiety   . Carpal tunnel syndrome of right wrist 10/22/2018  . Contraceptive management 11/05/2013  . Depression   . GERD (gastroesophageal reflux disease)   . IUD (intrauterine device) in place 01/13/2016  . Migraines   . Missed periods 01/13/2016  . Vaginal Pap smear, abnormal     Past Surgical History:  Procedure Laterality Date  . COLONOSCOPY N/A 05/10/2019   Procedure: COLONOSCOPY;  Surgeon: Corbin Ade, MD;  Location: [facility];  Service: Endoscopy;  Laterality: N/A;  1:00pm  . ESOPHAGOGASTRODUODENOSCOPY N/A 05/10/2019   Procedure: ESOPHAGOGASTRODUODENOSCOPY (EGD);  Surgeon: Corbin Ade, MD;  Location: [facility];  Service: Endoscopy;  Laterality: N/A;  . NO PAST SURGERIES      Current Outpatient Medications  Medication Sig Dispense Refill  . cholecalciferol (VITAMIN D3) 25 MCG (1000 UT) tablet Take 1,000 Units by mouth daily.    .  fluticasone (FLONASE) 50 MCG/ACT nasal spray Place 2 sprays into both nostrils daily.     [name] ibuprofen (ADVIL) 800 MG tablet Take 800 mg by mouth every 8 (eight) hours as needed for headache.     . levonorgestrel (MIRENA) 20 MCG/24HR IUD 1 each by Intrauterine route once.    . Multiple Vitamins-Minerals (MULTIVITAMIN WITH MINERALS) tablet Take 1 tablet by mouth daily.    . RABEprazole (ACIPHEX) 20 MG tablet Take 20 mg by mouth daily.     . rizatriptan (MAXALT) 10 MG tablet TAKE 1 TABLET BY MOUTH ONCE AT ONSET OF SYMPTOMS, MAY REPEAT DOSE IN 2 HOURS IF NEEDED. (Patient taking differently: Take 10 mg by mouth See admin instructions. TAKE 1 TABLET BY MOUTH ONCE AT ONSET OF SYMPTOMS, MAY REPEAT DOSE IN 2 HOURS IF NEEDED.) 9 tablet 0  . vitamin C (ASCORBIC ACID) 500 MG tablet Take 500 mg by mouth daily.    . Wheat Dextrin (BENEFIBER PO) Take 1 Scoop by mouth daily. Once daily      No current facility-administered medications for this visit.    Allergies as of 10/16/2019  . (No Known Allergies)    Family History  Problem Relation Age of Onset  . Other Mother        enlarged heart  . Diabetes Mother   . Hypertension Mother   . Hyperlipidemia Mother   . Heart disease Mother        heart murmer  . Miscarriages / [place] Mother   . Hypertension Father   . Hyperlipidemia Father   . Cancer Father        prostate  . Other Brother        enlarged heart; colloid cyst of the third ventricle( of the brain)  . Cancer Paternal Grandmother        leukemia  . Cancer Paternal Grandfather        lung  . Hypertension Sister   . Colon cancer Maternal Grandmother   . Colon cancer Maternal Aunt     Social History   Socioeconomic History  . Marital status: Divorced    Spouse name: Not on file  . Number of children: 3  . Years of education: 66  . Highest education level: Not on file  Occupational History  . Occupation: Administrator    Comment: [employer]  Tobacco Use  . Smoking  status: Never Smoker  . Smokeless tobacco: Never Used  Substance and Sexual Activity  . Alcohol use: Yes    Comment: occasional  . Drug use: No  . Sexual activity: Not Currently    Birth control/protection: I.U.D.  Other Topics Concern  . Not on file  Social History Narrative   Lives with parents   Looking for own place   Tries to walk daily   Social Determinants of Health   Financial Resource Strain:   . Difficulty of Paying Living Expenses: Not on file  Food Insecurity:   . Worried About Charity fundraiser in the Last Year: Not on file  . Ran Out of Food in the Last Year: Not on file  Transportation Needs:   . Lack of Transportation (Medical): Not on file  . Lack of Transportation (  Non-Medical): Not on file  Physical Activity:   . Days of Exercise per Week: Not on file  . Minutes of Exercise per Session: Not on file  Stress:   . Feeling of Stress : Not on file  Social Connections:   . Frequency of Communication with Friends and Family: Not on file  . Frequency of Social Gatherings with Friends and Family: Not on file  . Attends Religious Services: Not on file  . Active Member of Clubs or Organizations: Not on file  . Attends Banker Meetings: Not on file  . Marital Status: Not on file    Review of Systems: General: Negative for anorexia, weight loss, fever, chills, fatigue, weakness. ENT: Negative for hoarseness, difficulty swallowing. CV: Negative for chest pain, angina, palpitations, peripheral edema.  Respiratory: Negative for dyspnea at rest, cough, sputum, wheezing.  GI: See history of present illness. Endo: Negative for unusual weight change.  Heme: Negative for bruising or bleeding. Allergy: Negative for rash or hives.   Physical Exam: BP 118/76   Pulse 72   Temp (!) 97.3 F (36.3 C) (Temporal)   Ht 5\' 4"  (1.626 m)   Wt 274 lb 6.4 oz (124.5 kg)   BMI 47.10 kg/m  General:   Alert and oriented. Pleasant and cooperative. Well-nourished  and well-developed.  Eyes:  Without icterus, sclera clear and conjunctiva pink.  Ears:  Normal auditory acuity. Cardiovascular:  S1, S2 present without murmurs appreciated. Extremities without clubbing or edema. Respiratory:  Clear to auscultation bilaterally. No wheezes, rales, or rhonchi. No distress.  Gastrointestinal:  +BS, soft, non-tender and non-distended. No HSM noted. No guarding or rebound. No masses appreciated.  Rectal:  Deferred  Musculoskalatal:  Symmetrical without gross deformities. Skin:  Intact without significant lesions or rashes. Neurologic:  Alert and oriented x4;  grossly normal neurologically. Psych:  Alert and cooperative. Normal mood and affect. Heme/Lymph/Immune: No excessive bruising noted.    10/16/2019 2:05 PM   Disclaimer: This note was dictated with voice recognition software. Similar sounding words can inadvertently be transcribed and may not be corrected upon review.

## 2019-10-16 NOTE — Progress Notes (Signed)
Cc'ed to pcp °

## 2019-10-16 NOTE — Patient Instructions (Signed)
Your health issues we discussed today were:   GERD (reflux/heartburn): 1. Continue taking AcipHex as you have been 2. You can use Tums, Rolaids, milk, other substances that help for any breakthrough GERD symptoms 3. Call us if you have any worsening or severe symptoms  Weight loss: 1. We previously saw you for unintentional weight loss.  It appears her weight is stable today 2. Keep an eye on your weight and if you have any more significant weight loss let us know 3. We will check your weight at your next visit as well  Overall I recommend:  1. Continue your other current medications 2. Return for follow-up in 6 months 3. Call us if you have any questions or concerns.   COVID-19 Vaccine Information can be found at: [email or web address] For questions related to vaccine distribution or appointments, please email [email or web address] or call [phone].     At [facility] we value your feedback. You may receive a survey about your visit today. Please share your experience as we strive to create trusting relationships with our patients to provide genuine, compassionate, quality care.  We appreciate your understanding and patience as we review any laboratory studies, imaging, and other diagnostic tests that are ordered as we care for you. Our office policy is 5 business days for review of these results, and any emergent or urgent results are addressed in a timely manner for your best interest. If you do not hear from our office in 1 week, please contact us.   We also encourage the use of MyChart, which contains your medical information for your review as well. If you are not enrolled in this feature, an access code is on this after visit summary for your convenience. Thank you for allowing [place] to be involved in your care.  It was great to see you today!  I hope you have a Happy New Year!!

## 2019-10-16 NOTE — Assessment & Plan Note (Signed)
Weight loss appears to be doing well.  Her weight is nearly identically stable compared to her last visit a few months ago.  Continue to monitor and further recommendations to follow as needed.

## 2019-11-13 ENCOUNTER — Other Ambulatory Visit: Payer: Self-pay

## 2019-11-13 ENCOUNTER — Ambulatory Visit (INDEPENDENT_AMBULATORY_CARE_PROVIDER_SITE_OTHER): Payer: BLUE CROSS/BLUE SHIELD | Admitting: Adult Health

## 2019-11-13 ENCOUNTER — Encounter: Payer: Self-pay | Admitting: Adult Health

## 2019-11-13 VITALS — BP 128/85 | HR 79 | Ht 64.0 in | Wt 277.0 lb

## 2019-11-13 DIAGNOSIS — R10A2 Flank pain, left side: Secondary | ICD-10-CM | POA: Insufficient documentation

## 2019-11-13 DIAGNOSIS — R140 Abdominal distension (gaseous): Secondary | ICD-10-CM | POA: Diagnosis not present

## 2019-11-13 DIAGNOSIS — R109 Unspecified abdominal pain: Secondary | ICD-10-CM

## 2019-11-13 DIAGNOSIS — R350 Frequency of micturition: Secondary | ICD-10-CM

## 2019-11-13 LAB — POCT URINALYSIS DIPSTICK
Blood, UA: NEGATIVE
Glucose, UA: NEGATIVE
Ketones, UA: NEGATIVE
Leukocytes, UA: NEGATIVE
Nitrite, UA: NEGATIVE
Protein, UA: NEGATIVE

## 2019-11-13 NOTE — Patient Instructions (Signed)
Kidney Stones  Kidney stones are solid, rock-like deposits that form inside of the kidneys. The kidneys are a pair of organs that make urine. A kidney stone may form in a kidney and move into other parts of the urinary tract, including the tubes that connect the kidneys to the bladder (ureters), the bladder, and the tube that carries urine out of the body (urethra). As the stone moves through these areas, it can cause intense pain and block the flow of urine. Kidney stones are created when high levels of certain minerals are found in the urine. The stones are usually passed out of the body through urination, but in some cases, medical treatment may be needed to remove them. What are the causes? Kidney stones may be caused by:  A condition in which certain glands produce too much parathyroid hormone (primary hyperparathyroidism), which causes too much calcium buildup in the blood.  A buildup of uric acid crystals in the bladder (hyperuricosuria). Uric acid is a chemical that the body produces when you eat certain foods. It usually exits the body in the urine.  Narrowing (stricture) of one or both of the ureters.  A kidney blockage that is present at birth (congenital obstruction).  Past surgery on the kidney or the ureters, such as gastric bypass surgery. What increases the risk? The following factors may make you more likely to develop this condition:  Having had a kidney stone in the past.  Having a family history of kidney stones.  Not drinking enough water.  Eating a diet that is high in protein, salt (sodium), or sugar.  Being overweight or obese. What are the signs or symptoms? Symptoms of a kidney stone may include:  Pain in the side of the abdomen, right below the ribs (flank pain). Pain usually spreads (radiates) to the groin.  Needing to urinate frequently or urgently.  Painful urination.  Blood in the urine (hematuria).  Nausea.  Vomiting.  Fever and chills. How  is this diagnosed? This condition may be diagnosed based on:  Your symptoms and medical history.  A physical exam.  Blood tests.  Urine tests. These may be done before and after the stone passes out of your body through urination.  Imaging tests, such as a CT scan, abdominal X-ray, or ultrasound.  A procedure to examine the inside of the bladder (cystoscopy). How is this treated? Treatment for kidney stones depends on the size, location, and makeup of the stones. Kidney stones will often pass out of the body through urination. You may need to:  Increase your fluid intake to help pass the stone. In some cases, you may be given fluids through an IV and may need to be monitored at the hospital.  Take medicine for pain.  Make changes in your diet to help prevent kidney stones from coming back. Sometimes, medical procedures are needed to remove a kidney stone. This may involve:  A procedure to break up kidney stones using: ? A focused beam of light (laser therapy). ? Shock waves (extracorporeal shock wave lithotripsy).  Surgery to remove kidney stones. This may be needed if you have severe pain or have stones that block your urinary tract. Follow these instructions at home: Medicines  Take over-the-counter and prescription medicines only as told by your health care provider.  Ask your health care provider if the medicine prescribed to you requires you to avoid driving or using heavy machinery. Eating and drinking  Drink enough fluid to keep your urine pale yellow.   You may be instructed to drink at least 8-10 glasses of water each day. This will help you pass the kidney stone.  If directed, change your diet. This may include: ? Limiting how much sodium you eat. ? Eating more fruits and vegetables. ? Limiting how much animal protein--such as red meat, poultry, fish, and eggs--you eat.  Follow instructions from your health care provider about eating or drinking  restrictions. General instructions  Collect urine samples as told by your health care provider. You may need to collect a urine sample: ? 24 hours after you pass the stone. ? 8-12 weeks after passing the kidney stone, and every 6-12 months after that.  Strain your urine every time you urinate, for as long as directed. Use the strainer that your health care provider recommends.  Do not throw out the kidney stone after passing it. Keep the stone so it can be tested by your health care provider. Testing the makeup of your kidney stone may help prevent you from getting kidney stones in the future.  Keep all follow-up visits as told by your health care provider. This is important. You may need follow-up X-rays or ultrasounds to make sure that your stone has passed. How is this prevented? To prevent another kidney stone:  Drink enough fluid to keep your urine pale yellow. This is the best way to prevent kidney stones.  Eat a healthy diet and follow recommendations from your health care provider about foods to avoid. You may be instructed to eat a low-protein diet. Recommendations vary depending on the type of kidney stone that you have.  Maintain a healthy weight. Where to find more information  National Kidney Foundation (NKF): www.kidney.org  [employer] (UCF): www.urologyhealth.org Contact a health care provider if:  You have pain that gets worse or does not get better with medicine. Get help right away if:  You have a fever or chills.  You develop severe pain.  You develop new abdominal pain.  You faint.  You are unable to urinate. Summary  Kidney stones are solid, rock-like deposits that form inside of the kidneys.  Kidney stones can cause nausea, vomiting, blood in the urine, abdominal pain, and the urge to urinate frequently.  Treatment for kidney stones depends on the size, location, and makeup of the stones. Kidney stones will often pass out of the body  through urination.  Kidney stones can be prevented by drinking enough fluids, eating a healthy diet, and maintaining a healthy weight. This information is not intended to replace advice given to you by your health care provider. Make sure you discuss any questions you have with your health care provider. Document Revised: 02/05/2019 Document Reviewed: 02/05/2019 Elsevier Patient Education  2020 Elsevier Inc.  

## 2019-11-13 NOTE — Progress Notes (Signed)
  Subjective:     Patient ID: Kristin [name], female   DOB: [birth year], 39 y.o.   MRN: [id number]  HPI Kristin [name] is a 39 year old black female, divorced, G3P3 in complaining of pain in left side to back, bloating and urinary frequency.She had normal pelvic US in September for left side pain. And has seen GI for GERD. She had CT in 2020 that showed 1 mm non obstructing  Kidney stone.  She is working from home and sits and stands.  PCP is Cheron Every NP   Review of Systems +urianry frequency +bloating  +pain in left side to back No nausea  Denies any spasms or muscle pain, does feel better to lay on right side   Reviewed past medical,surgical, social and family history. Reviewed medications and allergies.     Objective:   Physical Exam BP 128/85 (BP Location: Left Arm, Patient Position: Sitting, Cuff Size: Large)   Pulse 79   Ht 5\' 4"  (1.626 m)   Wt 277 lb (125.6 kg)   BMI 47.55 kg/m urine dipstick is negative Skin warm and dry.Pelvic: external genitalia is normal in appearance no lesions, vagina: scantdischarge without odor,urethra has no lesions or masses noted, cervix: bulbous, +IUD strings at os, uterus: normal size, shape and contour, non tender, no masses felt, adnexa: no masses or tenderness noted. Bladder is non tender and no masses felt.No CVAT, but tender in left flank below bra line    Assessment:     1. Urinary frequency  2. Bloating  3. Left flank pain Will get abdomen 2/18 at 7:30 am at [facility]:     Will talk when [facility] results back Has physical and pap scheduled for 12/18/19    Review handout on kidney stones

## 2019-11-21 ENCOUNTER — Ambulatory Visit (HOSPITAL_COMMUNITY): Payer: BLUE CROSS/BLUE SHIELD | Attending: Adult Health

## 2019-11-29 ENCOUNTER — Ambulatory Visit (HOSPITAL_COMMUNITY)
Admission: RE | Admit: 2019-11-29 | Discharge: 2019-11-29 | Disposition: A | Discharge status: Home / Self Care | Payer: BLUE CROSS/BLUE SHIELD | Source: Ambulatory Visit | Attending: Adult Health | Admitting: Adult Health

## 2019-11-29 ENCOUNTER — Other Ambulatory Visit: Payer: Self-pay

## 2019-11-29 DIAGNOSIS — R109 Unspecified abdominal pain: Secondary | ICD-10-CM | POA: Insufficient documentation

## 2019-11-29 DIAGNOSIS — R140 Abdominal distension (gaseous): Secondary | ICD-10-CM | POA: Insufficient documentation

## 2019-12-03 ENCOUNTER — Telehealth: Payer: Self-pay | Admitting: *Deleted

## 2019-12-03 NOTE — Telephone Encounter (Signed)
Pt requesting ultrasound results.

## 2019-12-04 NOTE — Telephone Encounter (Signed)
Left message [place] was normal, results sent in mychart earlier

## 2019-12-18 ENCOUNTER — Other Ambulatory Visit: Payer: BLUE CROSS/BLUE SHIELD | Admitting: Adult Health

## 2019-12-26 ENCOUNTER — Emergency Department (HOSPITAL_COMMUNITY): Payer: BLUE CROSS/BLUE SHIELD

## 2019-12-26 ENCOUNTER — Other Ambulatory Visit: Payer: Self-pay

## 2019-12-26 ENCOUNTER — Encounter (HOSPITAL_COMMUNITY): Payer: Self-pay | Admitting: *Deleted

## 2019-12-26 ENCOUNTER — Emergency Department (HOSPITAL_COMMUNITY)
Admission: EM | Admit: 2019-12-26 | Discharge: 2019-12-26 | Disposition: A | Discharge status: Home / Self Care | Payer: BLUE CROSS/BLUE SHIELD | Attending: Emergency Medicine | Admitting: Emergency Medicine

## 2019-12-26 DIAGNOSIS — H60393 Other infective otitis externa, bilateral: Secondary | ICD-10-CM | POA: Diagnosis not present

## 2019-12-26 DIAGNOSIS — Z79899 Other long term (current) drug therapy: Secondary | ICD-10-CM | POA: Insufficient documentation

## 2019-12-26 DIAGNOSIS — R519 Headache, unspecified: Secondary | ICD-10-CM

## 2019-12-26 LAB — BASIC METABOLIC PANEL
Anion gap: 7 (ref 5–15)
BUN: 8 mg/dL (ref 6–20)
CO2: 27 mmol/L (ref 22–32)
Calcium: 9 mg/dL (ref 8.9–10.3)
Chloride: 102 mmol/L (ref 98–111)
Creatinine, Ser: 0.62 mg/dL (ref 0.44–1.00)
GFR calc Af Amer: >60 mL/min (ref >60)
GFR calc non Af Amer: >60 mL/min (ref >60)
Glucose, Bld: 93 mg/dL (ref 70–99)
Potassium: 3.5 mmol/L (ref 3.5–5.1)
Sodium: 136 mmol/L (ref 135–145)

## 2019-12-26 LAB — CBC
HCT: 42.9 % (ref 36.0–46.0)
Hemoglobin: 13.6 g/dL (ref 12.0–15.0)
MCH: 27 pg (ref 26.0–34.0)
MCHC: 31.7 g/dL (ref 30.0–36.0)
MCV: 85.3 fL (ref 80.0–100.0)
Platelets: 315 10*3/uL (ref 150–400)
RBC: 5.03 MIL/uL (ref 3.87–5.11)
RDW: 13.3 % (ref 11.5–15.5)
WBC: 6.7 10*3/uL (ref 4.0–10.5)
nRBC: 0 % (ref 0.0–0.2)

## 2019-12-26 MED ORDER — HYDROCORTISONE-ACETIC ACID 1-2 % OT SOLN: 4.0000 [drp] | Freq: Two times a day (BID) | OTIC | 0 refills | Status: DC

## 2019-12-26 MED ORDER — HYDROCORTISONE-ACETIC ACID 1-2 % OT SOLN: 4.0000 [drp] | Freq: Two times a day (BID) | OTIC | 0 refills | Status: AC

## 2019-12-26 MED ORDER — ACETAMINOPHEN 500 MG PO TABS
1000.0000 mg | ORAL_TABLET | Freq: Once | ORAL | Status: AC
  Administered 2019-12-26: 1000 mg via ORAL
  Filled 2019-12-26: qty 2

## 2019-12-26 MED ORDER — IOHEXOL 350 MG/ML SOLN
80.0000 mL | Freq: Once | INTRAVENOUS | Status: AC | PRN
  Administered 2019-12-26: 80 mL via INTRAVENOUS

## 2019-12-26 NOTE — ED Provider Notes (Signed)
[facility] EMERGENCY DEPARTMENT Provider Note   CSN: [id number] Arrival date & time: 12/26/19  1024     History Chief Complaint  Patient presents with  . Headache    Kristin [name] is a 39 y.o. female.  Patient with history of reflux, migraines presents with worsening of right posterior headache the past few weeks.  Intermittent episodes.  Patient has family history of cyst rupture in her brother who died from it.  Patient denies fevers chills or infectious symptoms.  Headaches are different than normal.  Patient denies any vision or stroke symptoms.        Past Medical History:  Diagnosis Date  . Allergy   . Anxiety   . Carpal tunnel syndrome of right wrist 10/22/2018  . Contraceptive management 11/05/2013  . Depression   . GERD (gastroesophageal reflux disease)   . IUD (intrauterine device) in place 01/13/2016  . Migraines   . Missed periods 01/13/2016  . Vaginal Pap smear, abnormal     Patient Active Problem List   Diagnosis Date Noted  . Left flank pain 11/13/2019  . Bloating 11/13/2019  . Urinary frequency 11/13/2019  . Loss of weight 06/12/2019  . Boil of groin 05/03/2019  . Lower abdominal pain 04/19/2019  . Pain with urination 04/19/2019  . GERD (gastroesophageal reflux disease) 03/18/2019  . Change in stool habits 03/18/2019  . Abnormal CT of the abdomen 03/18/2019  . Boil, leg 03/04/2019  . Screening for colorectal cancer 10/22/2018  . Screening for STDs (sexually transmitted diseases) 10/22/2018  . Encounter for gynecological examination with Papanicolaou smear of cervix 10/22/2018  . Family planning 10/22/2018  . Plantar fasciitis, right 10/22/2018  . Carpal tunnel syndrome of right wrist 10/22/2018  . IUD (intrauterine device) in place 01/13/2016  . Migraines 03/07/2013    Past Surgical History:  Procedure Laterality Date  . COLONOSCOPY N/A 05/10/2019   Procedure: COLONOSCOPY;  Surgeon: Corbin Ade, MD;  Location: [facility];  Service:  Endoscopy;  Laterality: N/A;  1:00pm  . ESOPHAGOGASTRODUODENOSCOPY N/A 05/10/2019   Procedure: ESOPHAGOGASTRODUODENOSCOPY (EGD);  Surgeon: Corbin Ade, MD;  Location: [facility];  Service: Endoscopy;  Laterality: N/A;  . NO PAST SURGERIES       OB History    Gravida  3   Para  3   Term  3   Preterm      AB      Living  3     SAB      TAB      Ectopic      Multiple  0   Live Births  3           Family History  Problem Relation Age of Onset  . Other Mother        enlarged heart  . Diabetes Mother   . Hypertension Mother   . Hyperlipidemia Mother   . Heart disease Mother        heart murmer  . Miscarriages / [place] Mother   . Hypertension Father   . Hyperlipidemia Father   . Cancer Father        prostate  . Other Brother        enlarged heart; colloid cyst of the third ventricle( of the brain)  . Cancer Paternal Grandmother        leukemia  . Cancer Paternal Grandfather        lung  . Hypertension Sister   . Colon cancer Maternal Grandmother   .  Colon cancer Maternal Aunt     Social History   Tobacco Use  . Smoking status: Never Smoker  . Smokeless tobacco: Never Used  Substance Use Topics  . Alcohol use: Yes    Comment: occasional  . Drug use: No    Home Medications Prior to Admission medications   Medication Sig Start Date End Date Taking? Authorizing Provider  cholecalciferol (VITAMIN D3) 25 MCG (1000 UT) tablet Take 1,000 Units by mouth daily.   Yes [provider]  fluticasone (FLONASE) 50 MCG/ACT nasal spray Place 2 sprays into both nostrils daily.  12/25/18  Yes [provider]  ibuprofen (ADVIL) 800 MG tablet Take 800 mg by mouth every 8 (eight) hours as needed for headache.  05/03/19  Yes [provider]  levonorgestrel (MIRENA) 20 MCG/24HR IUD 1 each by Intrauterine route once.   Yes [provider]  RABEprazole (ACIPHEX) 20 MG tablet Take 20 mg by mouth daily.  05/10/19  Yes [provider]  rizatriptan (MAXALT) 10 MG tablet TAKE 1 TABLET BY MOUTH ONCE AT ONSET OF SYMPTOMS, MAY REPEAT DOSE IN 2 HOURS IF NEEDED. Patient taking differently: Take 10 mg by mouth See admin instructions. TAKE 1 TABLET BY MOUTH ONCE AT ONSET OF SYMPTOMS, MAY REPEAT DOSE IN 2 HOURS IF NEEDED. 01/15/18  Yes Eustace Moore, MD  vitamin C (ASCORBIC ACID) 500 MG tablet Take 500 mg by mouth daily.   Yes [provider]  Wheat Dextrin (BENEFIBER PO) Take 1 Scoop by mouth daily. Once daily    Yes [provider]  acetic acid-hydrocortisone (VOSOL-HC) OTIC solution Place 4 drops into both ears 2 (two) times daily for 7 days. 12/26/19 01/02/20  Blane Ohara, MD  acetic acid-hydrocortisone (VOSOL-HC) OTIC solution Place 4 drops into both ears 2 (two) times daily for 7 days. 12/26/19 01/02/20  Blane Ohara, MD    Allergies    Patient has no known allergies.  Review of Systems   Review of Systems  Constitutional: Negative for chills and fever.  HENT: Negative for congestion.   Eyes: Negative for visual disturbance.  Respiratory: Negative for shortness of breath.   Cardiovascular: Negative for chest pain.  Gastrointestinal: Negative for abdominal pain and vomiting.  Genitourinary: Negative for dysuria and flank pain.  Musculoskeletal: Negative for back pain, neck pain and neck stiffness.  Skin: Negative for rash.  Neurological: Positive for light-headedness and headaches.    Physical Exam Updated Vital Signs BP 133/82 (BP Location: Right Arm)   Pulse 77   Temp 98.4 F (36.9 C) (Oral)   Resp 18   Ht 5\' 4"  (1.626 m)   Wt 124.7 kg   SpO2 100%   BMI 47.20 kg/m   Physical Exam Vitals and nursing note reviewed.  Constitutional:      Appearance: She is well-developed.  HENT:     Head: Normocephalic and atraumatic.     Comments: Patient has mild inflamed bilateral external auditory canals. Eyes:     General:        Right eye: No discharge.        Left eye: No  discharge.     Conjunctiva/sclera: Conjunctivae normal.  Neck:     Trachea: No tracheal deviation.  Cardiovascular:     Rate and Rhythm: Normal rate.  Pulmonary:     Effort: Pulmonary effort is normal.  Abdominal:     General: There is no distension.     Palpations: Abdomen is soft.     Tenderness:  There is no abdominal tenderness. There is no guarding.  Musculoskeletal:     Cervical back: Normal range of motion and neck supple.  Skin:    General: Skin is warm.     Findings: No rash.  Neurological:     Mental Status: She is alert and oriented to person, place, and time.     Cranial Nerves: No cranial nerve deficit or dysarthria.     Sensory: No sensory deficit.     Motor: No weakness.  Psychiatric:        Mood and Affect: Mood is anxious.     ED Results / Procedures / Treatments   Labs (all labs ordered are listed, but only abnormal results are displayed) Labs Reviewed  BASIC METABOLIC PANEL  CBC    EKG None  Radiology CT Angio Head W or Wo Contrast  Result Date: 12/26/2019 CLINICAL DATA:  Sharp headache back of head 2 weeks EXAM: CT ANGIOGRAPHY HEAD TECHNIQUE: Multidetector CT imaging of the head was performed using the standard protocol during bolus administration of intravenous contrast. Multiplanar CT image reconstructions and MIPs were obtained to evaluate the vascular anatomy. CONTRAST:  57mL OMNIPAQUE IOHEXOL 350 MG/ML SOLN COMPARISON:  None. FINDINGS: CT HEAD Brain: No evidence of acute infarction, hemorrhage, hydrocephalus, extra-axial collection or mass lesion/mass effect. Vascular: Negative for hyperdense vessel Skull: Negative Sinuses: Negative Orbits: Negative CTA HEAD Anterior circulation: Cavernous carotid normal bilaterally. Anterior and middle cerebral arteries widely patent. Negative for vascular malformation Posterior circulation: Both vertebral arteries widely patent to the basilar. PICA patent bilaterally. Basilar widely patent. Superior cerebellar and  posterior cerebral arteries patent bilaterally. Negative for vascular malformation. Venous sinuses: Normal venous enhancement. Anatomic variants: None IMPRESSION: Negative CT head Negative CTA head. No intracranial stenosis or vascular malformation. Electronically Signed   By: Marlan Palau M.D.   On: 12/26/2019 13:58    Procedures Procedures (including critical care time)  Medications Ordered in ED Medications  acetaminophen (TYLENOL) tablet 1,000 mg (1,000 mg Oral Given 12/26/19 1221)  iohexol (OMNIPAQUE) 350 MG/ML injection 80 mL (80 mLs Intravenous Contrast Given 12/26/19 1333)    ED Course  I have reviewed the triage vital signs and the nursing notes.  Pertinent labs & imaging results that were available during my care of the patient were reviewed by me and considered in my medical decision making (see chart for details).    MDM Rules/Calculators/A&P                     Patient presents with a new type of headache she has not had before.  Differential fairly broad given timing however with family history of death from headache/brain cyst per her report worsening symptoms plan for CT angiogram to look for any sinus cyst or aneurysm.  Basic blood work ordered.  Tylenol given.  CT scan results reviewed no acute abnormalities.  Plan for close outpatient follow-up.  Blood work ordered and reviewed normal hemoglobin, normal white blood cell count, no acute abnormalities.   Final Clinical Impression(s) / ED Diagnoses Final diagnoses:  Acute nonintractable headache, unspecified headache type  Other infective acute otitis externa of both ears    Rx / DC Orders ED Discharge Orders         Ordered    acetic acid-hydrocortisone (VOSOL-HC) OTIC solution  2 times daily     12/26/19 1420    acetic acid-hydrocortisone (VOSOL-HC) OTIC solution  2 times daily     12/26/19 1421  Elnora Morrison, MD 12/26/19 1421

## 2019-12-26 NOTE — ED Triage Notes (Signed)
Pt with sharp pains to back of her head for past couple of weeks, worse last night.   Also c/o neck and back pain due to arthritis. States her bottom lip was tingling after waking up on Tuesday for 5-10 minutes. Denies any vision changes.

## 2019-12-26 NOTE — Discharge Instructions (Addendum)
Use eardrops as directed and follow-up with your primary doctor if no improvement. Your CT scan of your head was reassuring. Use Tylenol as needed for headache.

## 2019-12-31 ENCOUNTER — Other Ambulatory Visit: Payer: Self-pay | Admitting: Nurse Practitioner

## 2019-12-31 ENCOUNTER — Other Ambulatory Visit (HOSPITAL_COMMUNITY): Payer: Self-pay | Admitting: Nurse Practitioner

## 2019-12-31 DIAGNOSIS — R519 Headache, unspecified: Secondary | ICD-10-CM

## 2020-01-07 ENCOUNTER — Encounter: Payer: Self-pay | Admitting: Internal Medicine

## 2020-01-16 ENCOUNTER — Encounter: Payer: Self-pay | Admitting: Orthopaedic Surgery

## 2020-01-16 ENCOUNTER — Other Ambulatory Visit: Payer: Self-pay

## 2020-01-16 ENCOUNTER — Ambulatory Visit: Payer: BLUE CROSS/BLUE SHIELD | Admitting: Orthopaedic Surgery

## 2020-01-16 VITALS — BP 142/90 | HR 67 | Ht 64.0 in | Wt 260.0 lb

## 2020-01-16 DIAGNOSIS — I83899 Varicose veins of unspecified lower extremities with other complications: Secondary | ICD-10-CM | POA: Diagnosis not present

## 2020-01-16 DIAGNOSIS — Z6841 Body Mass Index (BMI) 40.0 and over, adult: Secondary | ICD-10-CM

## 2020-01-16 NOTE — Progress Notes (Signed)
Patient GU:RKYHCWC Kristin [name], female DOB:1981-09-22, 39 y.o. BJS:[id number]  Chief Complaint  Patient presents with  . Leg Pain    left leg burning and back of lef thigh painful x2days     HPI  Kristin [name] is a 39 y.o. female who has developed pain of the mid lateral calf over an area of varicose veins that hurts at times.  She has no redness, no wound.  It hurts now and then but when it hurts, it is intense for a while.  She has no trauma.  She has had some tingling of the mid posterior thigh at times.  She has no paresthesias.  She has no pain to the feet or knee.  She has no lower back pain.  She has no trauma.  She has not really taken anything for this.  She mentioned it to her family doctor who told her to see me thinking it might be from the back.    Body mass index is 44.63 kg/m.  The patient meets the AMA guidelines for Morbid (severe) obesity with a BMI > 40.0 and I have recommended weight loss.   ROS  Review of Systems  Constitutional: Positive for activity change.  Musculoskeletal: Positive for arthralgias.  Allergic/Immunologic: Positive for environmental allergies.  All other systems reviewed and are negative.   All other systems reviewed and are negative.  The following is a summary of the past history medically, past history surgically, known current medicines, social history and family history.  This information is gathered electronically by the computer from prior information and documentation.  I review this each visit and have found including this information at this point in the chart is beneficial and informative.    Past Medical History:  Diagnosis Date  . Allergy   . Anxiety   . Carpal tunnel syndrome of right wrist 10/22/2018  . Contraceptive management 11/05/2013  . Depression   . GERD (gastroesophageal reflux disease)   . IUD (intrauterine device) in place 01/13/2016  . Migraines   . Missed periods 01/13/2016  . Vaginal Pap smear, abnormal      Past Surgical History:  Procedure Laterality Date  . COLONOSCOPY N/A 05/10/2019   Procedure: COLONOSCOPY;  Surgeon: Corbin Ade, MD;  Location: [facility];  Service: Endoscopy;  Laterality: N/A;  1:00pm  . ESOPHAGOGASTRODUODENOSCOPY N/A 05/10/2019   Procedure: ESOPHAGOGASTRODUODENOSCOPY (EGD);  Surgeon: Corbin Ade, MD;  Location: [facility];  Service: Endoscopy;  Laterality: N/A;  . NO PAST SURGERIES      Family History  Problem Relation Age of Onset  . Other Mother        enlarged heart  . Diabetes Mother   . Hypertension Mother   . Hyperlipidemia Mother   . Heart disease Mother        heart murmer  . Miscarriages / [place] Mother   . Hypertension Father   . Hyperlipidemia Father   . Cancer Father        prostate  . Other Brother        enlarged heart; colloid cyst of the third ventricle( of the brain)  . Cancer Paternal Grandmother        leukemia  . Cancer Paternal Grandfather        lung  . Hypertension Sister   . Colon cancer Maternal Grandmother   . Colon cancer Maternal Aunt     Social History Social History   Tobacco Use  . Smoking status: Never Smoker  . Smokeless  tobacco: Never Used  Substance Use Topics  . Alcohol use: Yes    Comment: occasional  . Drug use: No    No Known Allergies  Current Outpatient Medications  Medication Sig Dispense Refill  . cholecalciferol (VITAMIN D3) 25 MCG (1000 UT) tablet Take 1,000 Units by mouth daily.    . fluticasone (FLONASE) 50 MCG/ACT nasal spray Place 2 sprays into both nostrils daily.     [name] ibuprofen (ADVIL) 800 MG tablet Take 800 mg by mouth every 8 (eight) hours as needed for headache.     . levonorgestrel (MIRENA) 20 MCG/24HR IUD 1 each by Intrauterine route once.    . RABEprazole (ACIPHEX) 20 MG tablet Take 20 mg by mouth daily.     . rizatriptan (MAXALT) 10 MG tablet TAKE 1 TABLET BY MOUTH ONCE AT ONSET OF SYMPTOMS, MAY REPEAT DOSE IN 2 HOURS IF NEEDED. (Patient taking differently: Take  10 mg by mouth See admin instructions. TAKE 1 TABLET BY MOUTH ONCE AT ONSET OF SYMPTOMS, MAY REPEAT DOSE IN 2 HOURS IF NEEDED.) 9 tablet 0  . vitamin C (ASCORBIC ACID) 500 MG tablet Take 500 mg by mouth daily.    . Wheat Dextrin (BENEFIBER PO) Take 1 Scoop by mouth daily. Once daily      No current facility-administered medications for this visit.     Physical Exam  Blood pressure (!) 142/90, pulse 67, height 5\' 4"  (1.626 m), weight 260 lb (117.9 kg).  Constitutional: overall normal hygiene, normal nutrition, well developed, normal grooming, normal body habitus. Assistive device:none  Musculoskeletal: gait and station Limp none, muscle tone and strength are normal, no tremors or atrophy is present.  .  Neurological: coordination overall normal.  Deep tendon reflex/nerve stretch intact.  Sensation normal.  Cranial nerves II-XII intact.   Skin:   Normal overall no scars, lesions, ulcers or rashes. No psoriasis.  Psychiatric: Alert and oriented x 3.  Recent memory intact, remote memory unclear.  Normal mood and affect. Well groomed.  Good eye contact.  Cardiovascular: overall no swelling, there is lateral varicosities of the mid left calf, no redness, no drainage, tender.  Also some spider varicose veins on the right distal thigh laterally near the knee.  There is no edema bilaterally, normal temperatures of the legs and arms, no clubbing, cyanosis and good capillary refill.  Lymphatic: palpation is normal.  Back is negative.  Full ROM.  Full ROM of knees and hips.  No neurological abnormal findings.  All other systems reviewed and are negative   The patient has been educated about the nature of the problem(s) and counseled on treatment options.  The patient appeared to understand what I have discussed and is in agreement with it.  Encounter Diagnoses  Name Primary?  . Symptomatic spider varicose vein Yes  . Body mass index 40.0-44.9, adult ([place])   . Morbid obesity ([place])      PLAN Call if any problems.  Precautions discussed.  Continue current medications.   Return to clinic to see vein specialist for varicose vein   I told her I think the problem is more with the varicose veins.  She has no symptoms of back pain except for the tingling at times of the mid posterior left thigh.  She has no paresthesias.  She has negative SLR. She has full ROM.  I told her if the pain continues after she sees the vein doctors, then let me know and I will do back work-up.  I have recommended  heat and rubs to the legs until she sees the other doctors.  Electronically Signed Darreld Mclean, MD 4/15/20213:05 PM

## 2020-01-17 ENCOUNTER — Emergency Department (HOSPITAL_COMMUNITY)
Admission: EM | Admit: 2020-01-17 | Discharge: 2020-01-17 | Disposition: A | Discharge status: Home / Self Care | Payer: BLUE CROSS/BLUE SHIELD | Attending: Emergency Medicine | Admitting: Emergency Medicine

## 2020-01-17 ENCOUNTER — Other Ambulatory Visit: Payer: Self-pay

## 2020-01-17 ENCOUNTER — Encounter (HOSPITAL_COMMUNITY): Payer: Self-pay | Admitting: *Deleted

## 2020-01-17 DIAGNOSIS — Z7982 Long term (current) use of aspirin: Secondary | ICD-10-CM | POA: Insufficient documentation

## 2020-01-17 DIAGNOSIS — R251 Tremor, unspecified: Secondary | ICD-10-CM | POA: Insufficient documentation

## 2020-01-17 DIAGNOSIS — R531 Weakness: Secondary | ICD-10-CM | POA: Diagnosis not present

## 2020-01-17 DIAGNOSIS — Z79899 Other long term (current) drug therapy: Secondary | ICD-10-CM | POA: Diagnosis not present

## 2020-01-17 LAB — CBC WITH DIFFERENTIAL/PLATELET
Abs Immature Granulocytes: 0.01 10*3/uL (ref 0.00–0.07)
Basophils Absolute: 0.1 10*3/uL (ref 0.0–0.1)
Basophils Relative: 1 %
Eosinophils Absolute: 0.1 10*3/uL (ref 0.0–0.5)
Eosinophils Relative: 1 %
HCT: 40.9 % (ref 36.0–46.0)
Hemoglobin: 13 g/dL (ref 12.0–15.0)
Immature Granulocytes: 0 %
Lymphocytes Relative: 31 %
Lymphs Abs: 2.3 10*3/uL (ref 0.7–4.0)
MCH: 27.4 pg (ref 26.0–34.0)
MCHC: 31.8 g/dL (ref 30.0–36.0)
MCV: 86.3 fL (ref 80.0–100.0)
Monocytes Absolute: 0.6 10*3/uL (ref 0.1–1.0)
Monocytes Relative: 8 %
Neutro Abs: 4.4 10*3/uL (ref 1.7–7.7)
Neutrophils Relative %: 59 %
Platelets: 302 10*3/uL (ref 150–400)
RBC: 4.74 MIL/uL (ref 3.87–5.11)
RDW: 13.4 % (ref 11.5–15.5)
WBC: 7.4 10*3/uL (ref 4.0–10.5)
nRBC: 0 % (ref 0.0–0.2)

## 2020-01-17 LAB — COMPREHENSIVE METABOLIC PANEL
ALT: 16 U/L (ref 0–44)
AST: 15 U/L (ref 15–41)
Albumin: 3.9 g/dL (ref 3.5–5.0)
Alkaline Phosphatase: 99 U/L (ref 38–126)
Anion gap: 8 (ref 5–15)
BUN: <5 mg/dL — ABNORMAL LOW (ref 6–20)
CO2: 27 mmol/L (ref 22–32)
Calcium: 9 mg/dL (ref 8.9–10.3)
Chloride: 103 mmol/L (ref 98–111)
Creatinine, Ser: 0.56 mg/dL (ref 0.44–1.00)
GFR calc Af Amer: >60 mL/min (ref >60)
GFR calc non Af Amer: >60 mL/min (ref >60)
Glucose, Bld: 90 mg/dL (ref 70–99)
Potassium: 3.7 mmol/L (ref 3.5–5.1)
Sodium: 138 mmol/L (ref 135–145)
Total Bilirubin: 0.6 mg/dL (ref 0.3–1.2)
Total Protein: 7.3 g/dL (ref 6.5–8.1)

## 2020-01-17 NOTE — ED Provider Notes (Signed)
[facility] EMERGENCY DEPARTMENT Provider Note   CSN: [id number] Arrival date & time: 01/17/20  1826     History Chief Complaint  Patient presents with  . Abdominal Pain  . Shaking    Kristin [name] is a 39 y.o. female.  HPI   Patient presented to the ED for evaluation of episodes of shaking and weakness.  Patient states these episodes have been occurring after eating meals for the last couple of days.  She has been having some issues with abdominal discomfort but that was ongoing prior to these episodes.  1 the patient had one episode she called EMS and they checked her vitals as well as her blood sugar and her heart rhythm.  All were reportedly normal.  Patient was concerned so she decided to come to the ED to be evaluated.  She has not had any vomiting.  No diarrhea.  No fevers.  Past Medical History:  Diagnosis Date  . Allergy   . Anxiety   . Carpal tunnel syndrome of right wrist 10/22/2018  . Contraceptive management 11/05/2013  . Depression   . GERD (gastroesophageal reflux disease)   . IUD (intrauterine device) in place 01/13/2016  . Migraines   . Missed periods 01/13/2016  . Vaginal Pap smear, abnormal     Patient Active Problem List   Diagnosis Date Noted  . Left flank pain 11/13/2019  . Bloating 11/13/2019  . Urinary frequency 11/13/2019  . Loss of weight 06/12/2019  . Boil of groin 05/03/2019  . Lower abdominal pain 04/19/2019  . Pain with urination 04/19/2019  . GERD (gastroesophageal reflux disease) 03/18/2019  . Change in stool habits 03/18/2019  . Abnormal CT of the abdomen 03/18/2019  . Boil, leg 03/04/2019  . Screening for colorectal cancer 10/22/2018  . Screening for STDs (sexually transmitted diseases) 10/22/2018  . Encounter for gynecological examination with Papanicolaou smear of cervix 10/22/2018  . Family planning 10/22/2018  . Plantar fasciitis, right 10/22/2018  . Carpal tunnel syndrome of right wrist 10/22/2018  . IUD (intrauterine device)  in place 01/13/2016  . Migraines 03/07/2013    Past Surgical History:  Procedure Laterality Date  . COLONOSCOPY N/A 05/10/2019   Procedure: COLONOSCOPY;  Surgeon: Daneil Dolin, MD;  [facility];  Service: Endoscopy;  Laterality: N/A;  1:00pm  . ESOPHAGOGASTRODUODENOSCOPY N/A 05/10/2019   Procedure: ESOPHAGOGASTRODUODENOSCOPY (EGD);  Surgeon: Daneil Dolin, MD;  Location: [facility];  Service: Endoscopy;  Laterality: N/A;  . NO PAST SURGERIES       OB History    Gravida  3   Para  3   Term  3   Preterm      AB      Living  3     SAB      TAB      Ectopic      Multiple  0   Live Births  3           Family History  Problem Relation Age of Onset  . Other Mother        enlarged heart  . Diabetes Mother   . Hypertension Mother   . Hyperlipidemia Mother   . Heart disease Mother        heart murmer  . Miscarriages / [place] Mother   . Hypertension Father   . Hyperlipidemia Father   . Cancer Father        prostate  . Other Brother  enlarged heart; colloid cyst of the third ventricle( of the brain)  . Cancer Paternal Grandmother        leukemia  . Cancer Paternal Grandfather        lung  . Hypertension Sister   . Colon cancer Maternal Grandmother   . Colon cancer Maternal Aunt     Social History   Tobacco Use  . Smoking status: Never Smoker  . Smokeless tobacco: Never Used  Substance Use Topics  . Alcohol use: Yes    Comment: occasional  . Drug use: No    Home Medications Prior to Admission medications   Medication Sig Start Date End Date Taking? Authorizing Provider  aspirin EC 325 MG tablet Take 325 mg by mouth daily.   Yes [provider]  Calcium-Magnesium-Zinc (CAL-MAG-ZINC PO) Take 1 tablet by mouth daily.   Yes [provider]  cholecalciferol (VITAMIN D3) 25 MCG (1000 UT) tablet Take 1,000 Units by mouth daily.   Yes [provider]  citalopram (CELEXA) 20 MG tablet Take 20 mg by  mouth daily. 01/17/20  Yes [provider]  fluticasone (FLONASE) 50 MCG/ACT nasal spray Place 2 sprays into both nostrils daily.  12/25/18  Yes [provider]  hydrOXYzine (ATARAX/VISTARIL) 25 MG tablet Take 25 mg by mouth 3 (three) times daily. 01/17/20  Yes [provider]  ibuprofen (ADVIL) 800 MG tablet Take 800 mg by mouth every 8 (eight) hours as needed for headache.  05/03/19  Yes [provider]  levonorgestrel (MIRENA) 20 MCG/24HR IUD 1 each by Intrauterine route once.   Yes [provider]  RABEprazole (ACIPHEX) 20 MG tablet Take 20 mg by mouth daily.  05/10/19  Yes [provider]  rizatriptan (MAXALT) 10 MG tablet TAKE 1 TABLET BY MOUTH ONCE AT ONSET OF SYMPTOMS, MAY REPEAT DOSE IN 2 HOURS IF NEEDED. Patient taking differently: Take 10 mg by mouth See admin instructions. TAKE 1 TABLET BY MOUTH ONCE AT ONSET OF SYMPTOMS, MAY REPEAT DOSE IN 2 HOURS IF NEEDED. 01/15/18  Yes Eustace Moore, MD  vitamin C (ASCORBIC ACID) 500 MG tablet Take 500 mg by mouth daily.   Yes [provider]  Wheat Dextrin (BENEFIBER PO) Take 1 Scoop by mouth daily.    Yes [provider]    Allergies    Patient has no known allergies.  Review of Systems   Review of Systems  All other systems reviewed and are negative.   Physical Exam Updated Vital Signs BP 104/75   Pulse 66   Temp 98.7 F (37.1 C) (Oral)   Resp 16   SpO2 100%   Physical Exam Vitals and nursing note reviewed.  Constitutional:      General: She is not in acute distress.    Appearance: She is well-developed.  HENT:     Head: Normocephalic and atraumatic.     Right Ear: External ear normal.     Left Ear: External ear normal.  Eyes:     General: No scleral icterus.       Right eye: No discharge.        Left eye: No discharge.     Conjunctiva/sclera: Conjunctivae normal.  Neck:     Trachea: No tracheal deviation.  Cardiovascular:     Rate and Rhythm: Normal  rate and regular rhythm.  Pulmonary:     Effort: Pulmonary effort is normal. No respiratory distress.     Breath sounds: Normal breath sounds. No stridor. No wheezing or  rales.  Abdominal:     General: Bowel sounds are normal. There is no distension.     Palpations: Abdomen is soft.     Tenderness: There is no abdominal tenderness. There is no guarding or rebound.  Musculoskeletal:        General: No tenderness.     Cervical back: Neck supple.  Skin:    General: Skin is warm and dry.     Findings: No rash.  Neurological:     Mental Status: She is alert.     Cranial Nerves: No cranial nerve deficit (no facial droop, extraocular movements intact, no slurred speech).     Sensory: No sensory deficit.     Motor: No abnormal muscle tone or seizure activity.     Coordination: Coordination normal.     ED Results / Procedures / Treatments   Labs (all labs ordered are listed, but only abnormal results are displayed) Labs Reviewed  COMPREHENSIVE METABOLIC PANEL - Abnormal; Notable for the following components:      Result Value   BUN <5 (*)    All other components within normal limits  CBC WITH DIFFERENTIAL/PLATELET    EKG EKG Interpretation  Date/Time:  Friday January 17 2020 19:21:43 EDT Ventricular Rate:  69 PR Interval:    QRS Duration: 97 QT Interval:  384 QTC Calculation: 412 R Axis:   76 Text Interpretation: Sinus arrhythmia No significant change since last tracing Confirmed by Linwood Dibbles [phone]) on 01/17/2020 8:02:30 PM   Radiology No results found.  Procedures Procedures (including critical care time)  Medications Ordered in ED Medications - No data to display  ED Course  I have reviewed the triage vital signs and the nursing notes.  Pertinent labs & imaging results that were available during my care of the patient were reviewed by me and considered in my medical decision making (see chart for details).    MDM Rules/Calculators/A&P                       Etiology of the patient's symptoms are unclear.  She is not having any shaking currently.  She does not have any areas of pain or tenderness.  She did have a blood sugar checked by EMS with her first episode and it was normal.  Patient's laboratory tests in the ED are unremarkable.  She is not anemic.  She is not hypokalemic.  EKG shows normal heart rhythm.  Patient does have plans to follow-up with GI as an outpatient.  At this time there does not appear to be any evidence of an acute emergency medical condition and the patient appears stable for discharge with appropriate outpatient follow up.  Final Clinical Impression(s) / ED Diagnoses Final diagnoses:  Episode of shaking    Rx / DC Orders ED Discharge Orders    None       Linwood Dibbles, MD 01/17/20 2135

## 2020-01-17 NOTE — ED Triage Notes (Signed)
States she has had episodes of weakness and shaking after eating a meal for the past 2 days. Has a long history of abdominal problems

## 2020-01-17 NOTE — Discharge Instructions (Addendum)
Follow-up with your GI doctor as planned.  Continue your current medications.  Return as needed for worsening symptoms

## 2020-01-20 ENCOUNTER — Telehealth: Payer: Self-pay | Admitting: Emergency Medicine

## 2020-01-20 NOTE — Telephone Encounter (Signed)
Pt called and asked if she could be seen in the office today instead of tomorrow. Notified her that eric would not be in the office until tomorrow. Pt stated to please keep that appt

## 2020-01-21 ENCOUNTER — Encounter: Payer: Self-pay | Admitting: Nurse Practitioner

## 2020-01-21 ENCOUNTER — Ambulatory Visit: Payer: BLUE CROSS/BLUE SHIELD | Admitting: Nurse Practitioner

## 2020-01-21 ENCOUNTER — Other Ambulatory Visit: Payer: Self-pay

## 2020-01-21 VITALS — BP 130/84 | HR 74 | Temp 97.3°F | Ht 64.0 in | Wt 265.5 lb

## 2020-01-21 DIAGNOSIS — R1012 Left upper quadrant pain: Secondary | ICD-10-CM | POA: Diagnosis not present

## 2020-01-21 DIAGNOSIS — K219 Gastro-esophageal reflux disease without esophagitis: Secondary | ICD-10-CM | POA: Diagnosis not present

## 2020-01-21 DIAGNOSIS — R109 Unspecified abdominal pain: Secondary | ICD-10-CM | POA: Insufficient documentation

## 2020-01-21 DIAGNOSIS — R251 Tremor, unspecified: Secondary | ICD-10-CM | POA: Insufficient documentation

## 2020-01-21 NOTE — Progress Notes (Signed)
Referring Provider: Renee Rival, NP Primary Care Physician:  Renee Rival, NP Primary GI:  Dr. Gala Romney  Chief Complaint  Patient presents with  . Abdominal Pain    left side; gets shaky when eats and feels like she's gonna pass out  . loss of appetite    has lost 14 lbs in 2-3 weeks    HPI:   Kristin [name] is a 39 y.o. female who presents for follow-up on abdominal pain.  The patient was last seen in our office 10/16/2019 for GERD and weight loss.  Noted history of previous GERD, lower abdominal pain, weight loss.  CT of the abdomen 02/13/2019 with soft tissue density near the GE junction gaseous distention of the distal esophagus with remaining portions of the stomach unremarkable.  However, oral contrast was seen to the level of the splenic flexure with descending colon and splenic flexure decompressed and poorly evaluated with infectious or inflammatory colitis difficult to exclude and recommended correlation with history and exam.  Previously MiraLAX minimally effective for constipation and relying on Fleet enema and mag citrate.  Colonoscopy updated 04/22/2019 with entire colon normal and recommended repeat in 10 years.  EGD same day found normal esophagus, hiatal hernia, otherwise normal.  Recommended trial of AcipHex 20 mg daily.  At her last visit GERD doing better overall and AcipHex as well as OTC Tums or milk/bananas for occasional breakthrough.  No dysphagia.  Drinking more water.  Intermittent left upper quadrant pain with fluids that is brief and self resolves but notes to "choke" water and needs to probably slow down.  No other overt GI complaints.  Previous concern in November 2020 for possible Covid although she tested negative x2.  Recommended continue AcipHex, Tums, Rolaids, milk, etc. for GERD flares.  Weight noted to be stable and recommended she continue to monitor.  Follow-up in 6 months.  The patient apparently saw OB/GYN on 11/13/2019 for left flank pain.   That time she noted pain in the left side the back with bloating and urinary frequency.  Noted normal pelvic ultrasound in September.  Has seen GI for GERD with CT in 2028 showed 1 mm nonobstructing kidney stone.  Recommended abdominal ultrasound.  Abdominal U/S was completed 11/29/19 and found no acute abnormality.  Emergency room visit 01/17/2020 at [facility] for generalized weakness and shakiness immediately postprandial along with intermittent left upper quadrant abdominal pain.  Attempted review of note in care everywhere, but cannot find plan of treatment.  It appears she was later in the emergency room 01/17/2020 (same day) to [facility] for "episode of shaking".  She noted episodes of shaking and weakness after eating meals for the last couple days, some issues with abdominal discomfort but ongoing prior to these episodes.  When she called EMS they checked her vitals, blood sugar, heart rhythm and all were reportedly normal.  No vomiting, diarrhea, fevers.  Noted to not be having any shaking in the ED.  Labs were essentially normal without anemia or hypokalemia.  EKG normal heart rhythm.  Recommended follow-up with GI as an outpatient.  Today she states she's doing ok overall. She states she has been having episodes of shakiness and weakness, like she's going to pass out after eating. This started about a week ago. This typically resolves after sometime, but can last all day. Tried to eat apple slices or peanut butter and it recurred. Typically she eats chopped salads, cucumbers, honey-nut crunch, frozen egg rolls, chicken and  rice, pizza; avoids fast food. Doesn't eat much sweets. With her symptoms, doesn't feel like she has an appetite, but will make herself eat (had eggs this morning and no symptoms). Has lost 14 lbs in the past month subjectively (objectively she is down about 9 lbs in 3 months). Has a persistent dull left-sided/flank pain, worse when she lays on her right  side. Pain is constant, no triggers or alleviating factors identified. Some nausea (with her "shaky" symptoms) but no vomiting. Denies hematochezia, melena, fever, chills. Denies URI or flu-like symptoms. Denies loss of sense of taste or smell. She is scheduled for a COVID-19 vaccine scheduled for the morning. Denies chest pain, dyspnea, dizziness, lightheadedness, syncope, near syncope. Denies any other upper or lower GI symptoms.  States her GERD symptoms are getting worse. Has tried to eat smaller meals, which hasn't helped. She is on Aciphex daily. Has tried to stop taking Ibuprofen because she doesn't like it. She is on ASA 325 since her 2 kids had COVID-19.  Past Medical History:  Diagnosis Date  . Allergy   . Anxiety   . Carpal tunnel syndrome of right wrist 10/22/2018  . Contraceptive management 11/05/2013  . Depression   . GERD (gastroesophageal reflux disease)   . IUD (intrauterine device) in place 01/13/2016  . Migraines   . Missed periods 01/13/2016  . Vaginal Pap smear, abnormal     Past Surgical History:  Procedure Laterality Date  . COLONOSCOPY N/A 05/10/2019   Procedure: COLONOSCOPY;  Surgeon: Corbin Ade, MD;  [facility]: [facility];  Service: Endoscopy;  Laterality: N/A;  1:00pm  . ESOPHAGOGASTRODUODENOSCOPY N/A 05/10/2019   Procedure: ESOPHAGOGASTRODUODENOSCOPY (EGD);  Surgeon: Corbin Ade, MD;  Location: [facility];  Service: Endoscopy;  Laterality: N/A;  . NO PAST SURGERIES      Current Outpatient Medications  Medication Sig Dispense Refill  . aspirin EC 325 MG tablet Take 325 mg by mouth daily.    . Calcium-Magnesium-Zinc (CAL-MAG-ZINC PO) Take 1 tablet by mouth daily.    . cholecalciferol (VITAMIN D3) 25 MCG (1000 UT) tablet Take 1,000 Units by mouth daily.    . citalopram (CELEXA) 20 MG tablet Take 20 mg by mouth daily.    . fluticasone (FLONASE) 50 MCG/ACT nasal spray Place 2 sprays into both nostrils daily.     . hydrOXYzine (ATARAX/VISTARIL) 25 MG  tablet Take 25 mg by mouth as needed.     [name] ibuprofen (ADVIL) 800 MG tablet Take 800 mg by mouth every 8 (eight) hours as needed for headache.     . levonorgestrel (MIRENA) 20 MCG/24HR IUD 1 each by Intrauterine route once.    . RABEprazole (ACIPHEX) 20 MG tablet Take 20 mg by mouth daily.     . rizatriptan (MAXALT) 10 MG tablet TAKE 1 TABLET BY MOUTH ONCE AT ONSET OF SYMPTOMS, MAY REPEAT DOSE IN 2 HOURS IF NEEDED. (Patient taking differently: Take 10 mg by mouth See admin instructions. TAKE 1 TABLET BY MOUTH ONCE AT ONSET OF SYMPTOMS, MAY REPEAT DOSE IN 2 HOURS IF NEEDED.) 9 tablet 0  . vitamin C (ASCORBIC ACID) 500 MG tablet Take 500 mg by mouth daily.    . Wheat Dextrin (BENEFIBER PO) Take 1 Scoop by mouth daily.      No current facility-administered medications for this visit.    Allergies as of 01/21/2020  . (No Known Allergies)    Family History  Problem Relation Age of Onset  . Other Mother  enlarged heart  . Diabetes Mother   . Hypertension Mother   . Hyperlipidemia Mother   . Heart disease Mother        heart murmer  . Miscarriages / [place] Mother   . Hypertension Father   . Hyperlipidemia Father   . Cancer Father        prostate  . Other Brother        enlarged heart; colloid cyst of the third ventricle( of the brain)  . Cancer Paternal Grandmother        leukemia  . Cancer Paternal Grandfather        lung  . Hypertension Sister   . Colon cancer Maternal Grandmother   . Colon cancer Maternal Aunt     Social History   Socioeconomic History  . Marital status: Divorced    Spouse name: Not on file  . Number of children: 3  . Years of education: 84  . Highest education level: Not on file  Occupational History  . Occupation: Public house manager    Comment: [employer]  Tobacco Use  . Smoking status: Never Smoker  . Smokeless tobacco: Never Used  Substance and Sexual Activity  . Alcohol use: Yes    Comment: occasional  . Drug use: No  . Sexual  activity: Not Currently    Birth control/protection: I.U.D.  Other Topics Concern  . Not on file  Social History Narrative   Lives with parents   Looking for own place   Tries to walk daily   Social Determinants of Health   Financial Resource Strain:   . Difficulty of Paying Living Expenses:   Food Insecurity:   . Worried About Programme researcher, broadcasting/film/video in the Last Year:   . Barista in the Last Year:   Transportation Needs:   . Freight forwarder (Medical):   [name] Lack of Transportation (Non-Medical):   Physical Activity:   . Days of Exercise per Week:   . Minutes of Exercise per Session:   Stress:   . Feeling of Stress :   Social Connections:   . Frequency of Communication with Friends and Family:   . Frequency of Social Gatherings with Friends and Family:   . Attends Religious Services:   . Active Member of Clubs or Organizations:   . Attends Banker Meetings:   [name] Marital Status:     Subjective: Review of Systems  Constitutional: Negative for chills, fever, malaise/fatigue and weight loss.  HENT: Negative for congestion and sore throat.   Respiratory: Negative for cough and shortness of breath.   Cardiovascular: Negative for chest pain and palpitations.  Gastrointestinal: Negative for abdominal pain, blood in stool, diarrhea, melena, nausea and vomiting.  Musculoskeletal: Negative for joint pain and myalgias.  Skin: Negative for rash.  Neurological: Negative for dizziness and weakness.  Endo/Heme/Allergies: Does not bruise/bleed easily.  Psychiatric/Behavioral: Negative for depression. The patient is not nervous/anxious.   All other systems reviewed and are negative.    Objective: BP 130/84   Pulse 74   Temp (!) 97.3 F (36.3 C) (Temporal)   Ht 5\' 4"  (1.626 m)   Wt 265 lb 8 oz (120.4 kg)   BMI 45.57 kg/m  Physical Exam Vitals and nursing note reviewed.  Constitutional:      General: She is not in acute distress.    Appearance: Normal  appearance. She is well-developed. She is not ill-appearing, toxic-appearing or diaphoretic.  HENT:     Head: Normocephalic  and atraumatic.     Nose: No congestion or rhinorrhea.  Eyes:     General: No scleral icterus. Cardiovascular:     Rate and Rhythm: Normal rate and regular rhythm.     Heart sounds: Normal heart sounds.  Pulmonary:     Effort: Pulmonary effort is normal. No respiratory distress.     Breath sounds: Normal breath sounds.  Abdominal:     General: Bowel sounds are normal.     Palpations: Abdomen is soft. There is no hepatomegaly, splenomegaly or mass.     Tenderness: There is no abdominal tenderness. There is no guarding or rebound.     Hernia: No hernia is present.  Skin:    General: Skin is warm and dry.     Coloration: Skin is not jaundiced.     Findings: No rash.  Neurological:     General: No focal deficit present.     Mental Status: She is alert and oriented to person, place, and time.  Psychiatric:        Attention and Perception: Attention normal.        Mood and Affect: Mood normal.        Speech: Speech normal.        Behavior: Behavior normal.        Thought Content: Thought content normal.        Cognition and Memory: Cognition and memory normal.       01/21/2020 4:37 PM   Disclaimer: This note was dictated with voice recognition software. Similar sounding words can inadvertently be transcribed and may not be corrected upon review.

## 2020-01-21 NOTE — Patient Instructions (Addendum)
Your health issues we discussed today were:   GERD (reflux/heartburn) with left-sided/upper abdominal pain: 1. Increase her AcipHex to twice a day.  You can continue to take your current medication twice a day 2. You will run out early so I have sent an updated prescription to your pharmacy and asked him to hold it into you call and ask for it to be filled 3. Avoid NSAIDs (ibuprofen, Motrin, Advil, Aleve, naproxen, Naprosyn, aspirin, Goody powders, BC powders). 4. Essentially, you should reduce your aspirin to 81 mg (or stop altogether) and avoid ibuprofen you have been taking intermittently 5. Call us if you have any worsening or severe symptoms  "Shaky feeling after eating": 1. As we discussed, I will discuss the situation with Dr. Work and your primary care. 2. We may eventually need to send you to an endocrinologist to be evaluated for any low blood sugar situations that could be a precursor to diabetes 3. Further recommendations will follow  Overall I recommend:  1. Continue your other current medications 2. Return for follow-up in 6 to 8 weeks 3. Call us if you have any questions or concerns   ---------------------------------------------------------------  I am glad you are getting your COVID-19 vaccine tomorrow!  Even after you are fully vaccinated you should continue to wear a mask, socially distance, and wash your hands frequently.  ---------------------------------------------------------------   At [facility] we value your feedback. You may receive a survey about your visit today. Please share your experience as we strive to create trusting relationships with our patients to provide genuine, compassionate, quality care.  We appreciate your understanding and patience as we review any laboratory studies, imaging, and other diagnostic tests that are ordered as we care for you. Our office policy is 5 business days for review of these results, and any emergent or  urgent results are addressed in a timely manner for your best interest. If you do not hear from our office in 1 week, please contact us.   We also encourage the use of MyChart, which contains your medical information for your review as well. If you are not enrolled in this feature, an access code is on this after visit summary for your convenience. Thank you for allowing [place] to be involved in your care.  It was great to see you today!  I hope you have a great Summer!!     Here are some dietary strategies that can help you maintain good nutrition and minimize your symptoms.  1. Eat smaller meals. Try eating five or six small meals a day rather than three larger ones. 2. Drink most of your fluids between meals. At first, don't drink anything for 30 to 60 minutes before and after meals. 3. Drink 6 to 8 cups (1.4 to 1.9 liters) of fluids a day. At first, limit fluid with meals to 1/2 cup (118 milliliters). Increase fluid with meals as you tolerate it. 4. Change your diet. Eat more protein -- meat, poultry, creamy peanut butter and fish -- and complex carbohydrates -- oatmeal and other whole-grain foods high in fiber. Limit high-sugar foods, such as candy, table sugar, syrup, sodas and juices.  The natural sugar in dairy products (lactose) might worsen your symptoms. Try small amounts at first, or eliminate them if you think they're causing problems. You might want to see a registered dietitian for more advice about what to eat.  Increase fiber intake. Psyllium, guar gum and pectin in food or supplements can delay the absorption of carbohydrates  in the small intestine.

## 2020-01-22 ENCOUNTER — Ambulatory Visit (HOSPITAL_COMMUNITY): Payer: BLUE CROSS/BLUE SHIELD

## 2020-01-22 ENCOUNTER — Encounter: Payer: Self-pay | Admitting: Internal Medicine

## 2020-01-22 ENCOUNTER — Other Ambulatory Visit: Payer: Self-pay

## 2020-01-22 ENCOUNTER — Emergency Department (HOSPITAL_COMMUNITY): Payer: BLUE CROSS/BLUE SHIELD

## 2020-01-22 ENCOUNTER — Emergency Department (HOSPITAL_COMMUNITY)
Admission: EM | Admit: 2020-01-22 | Discharge: 2020-01-22 | Disposition: A | Discharge status: Home / Self Care | Payer: BLUE CROSS/BLUE SHIELD | Attending: Emergency Medicine | Admitting: Emergency Medicine

## 2020-01-22 ENCOUNTER — Encounter (HOSPITAL_COMMUNITY): Payer: Self-pay | Admitting: Emergency Medicine

## 2020-01-22 DIAGNOSIS — Z7982 Long term (current) use of aspirin: Secondary | ICD-10-CM | POA: Insufficient documentation

## 2020-01-22 DIAGNOSIS — Z79899 Other long term (current) drug therapy: Secondary | ICD-10-CM | POA: Diagnosis not present

## 2020-01-22 DIAGNOSIS — R002 Palpitations: Secondary | ICD-10-CM | POA: Insufficient documentation

## 2020-01-22 DIAGNOSIS — R42 Dizziness and giddiness: Secondary | ICD-10-CM | POA: Diagnosis present

## 2020-01-22 LAB — COMPREHENSIVE METABOLIC PANEL
ALT: 17 U/L (ref 0–44)
AST: 13 U/L — ABNORMAL LOW (ref 15–41)
Albumin: 3.8 g/dL (ref 3.5–5.0)
Alkaline Phosphatase: 102 U/L (ref 38–126)
Anion gap: 8 (ref 5–15)
BUN: 8 mg/dL (ref 6–20)
CO2: 27 mmol/L (ref 22–32)
Calcium: 8.7 mg/dL — ABNORMAL LOW (ref 8.9–10.3)
Chloride: 102 mmol/L (ref 98–111)
Creatinine, Ser: 0.57 mg/dL (ref 0.44–1.00)
GFR calc Af Amer: >60 mL/min (ref >60)
GFR calc non Af Amer: >60 mL/min (ref >60)
Glucose, Bld: 110 mg/dL — ABNORMAL HIGH (ref 70–99)
Potassium: 3.7 mmol/L (ref 3.5–5.1)
Sodium: 137 mmol/L (ref 135–145)
Total Bilirubin: 0.7 mg/dL (ref 0.3–1.2)
Total Protein: 7.3 g/dL (ref 6.5–8.1)

## 2020-01-22 LAB — CBC WITH DIFFERENTIAL/PLATELET
Abs Immature Granulocytes: 0.01 10*3/uL (ref 0.00–0.07)
Basophils Absolute: 0 10*3/uL (ref 0.0–0.1)
Basophils Relative: 0 %
Eosinophils Absolute: 0.1 10*3/uL (ref 0.0–0.5)
Eosinophils Relative: 1 %
HCT: 39.9 % (ref 36.0–46.0)
Hemoglobin: 12.5 g/dL (ref 12.0–15.0)
Immature Granulocytes: 0 %
Lymphocytes Relative: 30 %
Lymphs Abs: 2.1 10*3/uL (ref 0.7–4.0)
MCH: 26.9 pg (ref 26.0–34.0)
MCHC: 31.3 g/dL (ref 30.0–36.0)
MCV: 86 fL (ref 80.0–100.0)
Monocytes Absolute: 0.5 10*3/uL (ref 0.1–1.0)
Monocytes Relative: 6 %
Neutro Abs: 4.4 10*3/uL (ref 1.7–7.7)
Neutrophils Relative %: 63 %
Platelets: 289 10*3/uL (ref 150–400)
RBC: 4.64 MIL/uL (ref 3.87–5.11)
RDW: 13.5 % (ref 11.5–15.5)
WBC: 7.1 10*3/uL (ref 4.0–10.5)
nRBC: 0 % (ref 0.0–0.2)

## 2020-01-22 LAB — TROPONIN I (HIGH SENSITIVITY): Troponin I (High Sensitivity): 2 ng/L (ref <18)

## 2020-01-22 NOTE — Discharge Instructions (Signed)
Work-up here without any acute findings.  But by history it sounds as if you are having heart palpitations.  Make an appointment to follow-up with cardiology.  Return for any new or worse symptoms.  Return for rapid heart rate lasting 40 minutes or longer.  Work note provided.

## 2020-01-22 NOTE — ED Provider Notes (Addendum)
[facility] EMERGENCY DEPARTMENT Provider Note   CSN: [id number] Arrival date & time: 01/22/20  3557     History Chief Complaint  Patient presents with  . Dizziness    Kristin [name] is a 39 y.o. female.  Patient with complaint of the feeling of her heart going fast 2 episodes during the night.  Also some left arm discomfort but it is worse with some movement.  No anterior chest pain.  Patient did feel funny in the head.  There was some nausea but no vomiting no shortness of breath.  Patient feels fine now.  Patient is never had this happen before.  Has had the arm discomfort before.  And felt that it was due to sleeping on the arm.        Past Medical History:  Diagnosis Date  . Allergy   . Anxiety   . Carpal tunnel syndrome of right wrist 10/22/2018  . Contraceptive management 11/05/2013  . Depression   . GERD (gastroesophageal reflux disease)   . IUD (intrauterine device) in place 01/13/2016  . Migraines   . Missed periods 01/13/2016  . Vaginal Pap smear, abnormal     Patient Active Problem List   Diagnosis Date Noted  . Abdominal pain 01/21/2020  . Shakiness 01/21/2020  . Left flank pain 11/13/2019  . Bloating 11/13/2019  . Urinary frequency 11/13/2019  . Loss of weight 06/12/2019  . Boil of groin 05/03/2019  . Lower abdominal pain 04/19/2019  . Pain with urination 04/19/2019  . GERD (gastroesophageal reflux disease) 03/18/2019  . Change in stool habits 03/18/2019  . Abnormal CT of the abdomen 03/18/2019  . Boil, leg 03/04/2019  . Screening for colorectal cancer 10/22/2018  . Screening for STDs (sexually transmitted diseases) 10/22/2018  . Encounter for gynecological examination with Papanicolaou smear of cervix 10/22/2018  . Family planning 10/22/2018  . Plantar fasciitis, right 10/22/2018  . Carpal tunnel syndrome of right wrist 10/22/2018  . IUD (intrauterine device) in place 01/13/2016  . Migraines 03/07/2013    Past Surgical History:  Procedure  Laterality Date  . COLONOSCOPY N/A 05/10/2019   Procedure: COLONOSCOPY;  Surgeon: Corbin Ade, MD;  [facility];  Service: Endoscopy;  Laterality: N/A;  1:00pm  . ESOPHAGOGASTRODUODENOSCOPY N/A 05/10/2019   Procedure: ESOPHAGOGASTRODUODENOSCOPY (EGD);  Surgeon: Corbin Ade, MD;  Location: [facility];  Service: Endoscopy;  Laterality: N/A;  . NO PAST SURGERIES       OB History    Gravida  3   Para  3   Term  3   Preterm      AB      Living  3     SAB      TAB      Ectopic      Multiple  0   Live Births  3           Family History  Problem Relation Age of Onset  . Other Mother        enlarged heart  . Diabetes Mother   . Hypertension Mother   . Hyperlipidemia Mother   . Heart disease Mother        heart murmer  . Miscarriages / [place] Mother   . Hypertension Father   . Hyperlipidemia Father   . Cancer Father        prostate  . Other Brother        enlarged heart; colloid cyst of the third ventricle( of the brain)  .  Cancer Paternal Grandmother        leukemia  . Cancer Paternal Grandfather        lung  . Hypertension Sister   . Colon cancer Maternal Grandmother   . Colon cancer Maternal Aunt     Social History   Tobacco Use  . Smoking status: Never Smoker  . Smokeless tobacco: Never Used  Substance Use Topics  . Alcohol use: Yes    Comment: occasional  . Drug use: No    Home Medications Prior to Admission medications   Medication Sig Start Date End Date Taking? Authorizing Provider  aspirin EC 325 MG tablet Take 325 mg by mouth daily.    [provider]  Calcium-Magnesium-Zinc (CAL-MAG-ZINC PO) Take 1 tablet by mouth daily.    [provider]  cholecalciferol (VITAMIN D3) 25 MCG (1000 UT) tablet Take 1,000 Units by mouth daily.    [provider]  citalopram (CELEXA) 20 MG tablet Take 20 mg by mouth daily. 01/17/20   [provider]  fluticasone (FLONASE) 50 MCG/ACT nasal spray  Place 2 sprays into both nostrils daily.  12/25/18   [provider]  hydrOXYzine (ATARAX/VISTARIL) 25 MG tablet Take 25 mg by mouth as needed.  01/17/20   [provider]  ibuprofen (ADVIL) 800 MG tablet Take 800 mg by mouth every 8 (eight) hours as needed for headache.  05/03/19   [provider]  levonorgestrel (MIRENA) 20 MCG/24HR IUD 1 each by Intrauterine route once.    [provider]  RABEprazole (ACIPHEX) 20 MG tablet Take 20 mg by mouth daily.  05/10/19   [provider]  rizatriptan (MAXALT) 10 MG tablet TAKE 1 TABLET BY MOUTH ONCE AT ONSET OF SYMPTOMS, MAY REPEAT DOSE IN 2 HOURS IF NEEDED. Patient taking differently: Take 10 mg by mouth See admin instructions. TAKE 1 TABLET BY MOUTH ONCE AT ONSET OF SYMPTOMS, MAY REPEAT DOSE IN 2 HOURS IF NEEDED. 01/15/18   Raylene Everts, MD  vitamin C (ASCORBIC ACID) 500 MG tablet Take 500 mg by mouth daily.    [provider]  Wheat Dextrin (BENEFIBER PO) Take 1 Scoop by mouth daily.     [provider]    Allergies    Patient has no known allergies.  Review of Systems   Review of Systems  Constitutional: Negative for chills and fever.  HENT: Negative for congestion, rhinorrhea and sore throat.   Eyes: Negative for visual disturbance.  Respiratory: Negative for cough and shortness of breath.   Cardiovascular: Positive for palpitations. Negative for chest pain and leg swelling.  Gastrointestinal: Positive for nausea. Negative for abdominal pain, diarrhea and vomiting.  Genitourinary: Negative for dysuria.  Musculoskeletal: Negative for back pain and neck pain.  Skin: Negative for rash.  Neurological: Positive for light-headedness. Negative for dizziness and headaches.  Hematological: Does not bruise/bleed easily.  Psychiatric/Behavioral: Negative for confusion.    Physical Exam Updated Vital Signs BP 119/71   Pulse 64   Temp 98.1 F (36.7 C) (Oral)   Resp 16   Ht 1.626 m  (5\' 4" )   Wt 120.4 kg   SpO2 100%   BMI 45.56 kg/m   Physical Exam Vitals and nursing note reviewed.  Constitutional:      General: She is not in acute distress.    Appearance: Normal appearance. She is well-developed.  HENT:     Head: Normocephalic and atraumatic.  Eyes:     Extraocular Movements: Extraocular movements intact.  Conjunctiva/sclera: Conjunctivae normal.     Pupils: Pupils are equal, round, and reactive to light.  Cardiovascular:     Rate and Rhythm: Normal rate and regular rhythm.     Heart sounds: No murmur.  Pulmonary:     Effort: Pulmonary effort is normal. No respiratory distress.     Breath sounds: Normal breath sounds.  Abdominal:     Palpations: Abdomen is soft.     Tenderness: There is no abdominal tenderness.  Musculoskeletal:        General: Normal range of motion.     Cervical back: Normal range of motion and neck supple.     Comments: Left radial pulse tube plus.  Skin:    General: Skin is warm and dry.     Capillary Refill: Capillary refill takes less than 2 seconds.  Neurological:     General: No focal deficit present.     Mental Status: She is alert and oriented to person, place, and time.     Cranial Nerves: No cranial nerve deficit.     Sensory: No sensory deficit.     Motor: No weakness.     ED Results / Procedures / Treatments   Labs (all labs ordered are listed, but only abnormal results are displayed) Labs Reviewed  COMPREHENSIVE METABOLIC PANEL - Abnormal; Notable for the following components:      Result Value   Glucose, Bld 110 (*)    Calcium 8.7 (*)    AST 13 (*)    All other components within normal limits  CBC WITH DIFFERENTIAL/PLATELET  TROPONIN I (HIGH SENSITIVITY)  TROPONIN I (HIGH SENSITIVITY)    EKG EKG Interpretation  Date/Time:  Wednesday January 22 2020 07:52:30 EDT Ventricular Rate:  66 PR Interval:    QRS Duration: 96 QT Interval:  416 QTC Calculation: 436 R Axis:   75 Text Interpretation: Sinus  rhythm ST elev, probable normal early repol pattern Confirmed by Vanetta Mulders [phone]) on 01/22/2020 8:15:56 AM   Radiology DG Chest Port 1 View  Result Date: 01/22/2020 CLINICAL DATA:  Tachycardia. Personal history of COVID-19 6 months ago. EXAM: PORTABLE CHEST 1 VIEW COMPARISON:  None. FINDINGS: The heart size is normal. Lung volumes are low. No focal airspace disease is present. IMPRESSION: 1. Low lung volumes. 2. No acute cardiopulmonary disease. Electronically Signed   By: Marin Roberts M.D.   On: 01/22/2020 07:56    Procedures Procedures (including critical care time)  Medications Ordered in ED Medications - No data to display  ED Course  I have reviewed the triage vital signs and the nursing notes.  Pertinent labs & imaging results that were available during my care of the patient were reviewed by me and considered in my medical decision making (see chart for details).    MDM Rules/Calculators/A&P                     Cardiac monitoring here without any arrhythmias.  EKG without any significant abnormalities may be a little bit of subtle ST changes more consistent with repolarization.  Patient had the first episode of the palpitations prior to 3 in the morning.  So I feel that the first troponin being normal is very reassuring.  There never was any anterior chest pain.  There was no shortness of breath.  CBC and complete metabolic panel here without any significant abnormalities.  Patient's vital signs without fever oxygen saturations 100% on room air blood pressure was 139/83.  Will have patient follow-up  with outpatient cardiology for consideration may be for Holter monitoring.  Do not feel that patient's had an acute cardiac event.  Patient given precautions to return if the rapid heart rate reoccurs and does not resolve in 40 minutes.  Or for any new or worse symptoms.  Final Clinical Impression(s) / ED Diagnoses Final diagnoses:  Palpitations    Rx / DC Orders ED  Discharge Orders    None       Vanetta Mulders, MD 01/22/20 0347    Vanetta Mulders, MD 01/22/20 [phone]

## 2020-01-22 NOTE — ED Triage Notes (Signed)
Pt C/O feeling "funny in the head and nauseas." Pt states she was also seen for something similar last week.

## 2020-01-23 ENCOUNTER — Ambulatory Visit (HOSPITAL_COMMUNITY): Payer: BLUE CROSS/BLUE SHIELD

## 2020-01-27 ENCOUNTER — Other Ambulatory Visit: Payer: Self-pay | Admitting: Internal Medicine

## 2020-01-27 NOTE — Assessment & Plan Note (Signed)
The patient has been having some interesting episode recently well of shakiness and weakness like she is going to pass out which typically occurs after eating.  This started about a week ago and typically resolves after some time but at times has lasted all day.  This is occurred even when she tries to eat apple slices or peanut butter.  List of common food intake as per HPI, typically avoids fast food.  Avoids sweets.  Because of her symptoms she has lateral lost appetite and decreased weight.  Interestingly, when she had eggs (majority protein fat) she did not have symptoms.  With her shaky symptoms she also has some nausea but denies vomiting.  At this point query possible accelerated gastric emptying (similar to dumping syndrome) versus endocrine disorder at play.  I will discuss this with Dr. Jena Gauss.  We may proceed with gastric emptying study in the future and possibly referral to endocrine to make sure that there is nothing there that needs to be treated or evaluated.  Follow-up in 6 to 8 weeks.  Call for any worsening or severe symptoms.

## 2020-01-27 NOTE — Assessment & Plan Note (Signed)
GERD symptoms getting worse.  Patient is currently on AcipHex once daily.  She is also taken it upon herself to start aspirin 325 mg daily because her 2 children had Covid and she wants to "avoid clots associated with Covid" even though she tested negative.  At this point I will increase her AcipHex to twice a day.  I recommended she stop taking aspirin and other NSAIDs unless it specifically indicated by healthcare provider.  I will have her follow-up in 6 to 8 weeks.  Further recommendations below.

## 2020-01-27 NOTE — Assessment & Plan Note (Addendum)
The patient notes persistent dull left-sided/flank pain that is worse when she lays on her side.  Not particularly bothersome, more in the left upper.  On exam she is not tender to palpation.  Query multifactorial etiology including her flared GERD symptoms on aspirin daily.  I have increased her PPI and recommended she stop NSAIDs.  We will bring her back for early interval follow-up in 6 weeks to see if this has had an improvement on her left side/left upper quadrant discomfort.  I have asked her to call me for any worsening symptoms before then.

## 2020-01-29 ENCOUNTER — Encounter (HOSPITAL_COMMUNITY): Payer: Self-pay

## 2020-01-29 ENCOUNTER — Ambulatory Visit (HOSPITAL_COMMUNITY): Payer: BLUE CROSS/BLUE SHIELD

## 2020-01-29 NOTE — Progress Notes (Signed)
Cardiology Office Note    Date:  02/03/2020   ID:  Kristin [name], DOB [birth year], MRN [id number]  PCP:  Renee Rival, NP  Cardiologist: Jenkins Rouge, MD EPS: None  Chief Complaint  Patient presents with  . Palpitations    History of Present Illness:  Kristin [name] is a 39 y.o. female with history of anxiety and depression who saw Dr. Johnsie Cancel in 2018 for palpitations.  She was under a lot of stress at that time.  He check labs and ordered as needed Inderal 10 mg and she was to call if she wanted to wear a monitor.  She saw Dr. Johnsie Cancel 10/07/2019 after an ER visit for chest pain felt to be atypical that improved as anxiety improved.  No further testing ordered.  Patient was in the ER 01/22/2020 with palpitations.  No arrhythmias noted on monitoring.  EKG had repolarization changes.  Troponin normal.  Patient says it started to week before after she would eat-felt jittery/shaky and then developed palpitations. Then it awakened at 3 am and got up and walked and did deep breathing. HR was 130. Happened again this past Sunday-lasted 15 min. She drinks one cup of coffee daily  No OTC meds. Under a lot of stress trying to work at home and kids are doing Holiday representative. Was on celexa and had stopped it but now back on it. Doesn't tolerate a whole tablet but taking 1/2 tablet.  Past Medical History:  Diagnosis Date  . Allergy   . Anxiety   . Carpal tunnel syndrome of right wrist 10/22/2018  . Contraceptive management 11/05/2013  . Depression   . GERD (gastroesophageal reflux disease)   . IUD (intrauterine device) in place 01/13/2016  . Migraines   . Missed periods 01/13/2016  . Vaginal Pap smear, abnormal     Past Surgical History:  Procedure Laterality Date  . COLONOSCOPY N/A 05/10/2019   Procedure: COLONOSCOPY;  Surgeon: Daneil Dolin, MD;  [facility];  Service: Endoscopy;  Laterality: N/A;  1:00pm  . ESOPHAGOGASTRODUODENOSCOPY N/A 05/10/2019   Procedure:  ESOPHAGOGASTRODUODENOSCOPY (EGD);  Surgeon: Daneil Dolin, MD;  Location: [facility];  Service: Endoscopy;  Laterality: N/A;  . NO PAST SURGERIES      Current Medications: Current Meds  Medication Sig  . aspirin EC 325 MG tablet Take 325 mg by mouth daily.  . cholecalciferol (VITAMIN D3) 25 MCG (1000 UT) tablet Take 1,000 Units by mouth daily.  . citalopram (CELEXA) 20 MG tablet Take 20 mg by mouth daily.  . fluticasone (FLONASE) 50 MCG/ACT nasal spray Place 2 sprays into both nostrils daily.   . hydrOXYzine (ATARAX/VISTARIL) 25 MG tablet Take 25 mg by mouth as needed.   [name] levonorgestrel (MIRENA) 20 MCG/24HR IUD 1 each by Intrauterine route once.  . RABEprazole (ACIPHEX) 20 MG tablet Take 1 tablet (20 mg total) by mouth in the morning and at bedtime.  . rizatriptan (MAXALT) 10 MG tablet TAKE 1 TABLET BY MOUTH ONCE AT ONSET OF SYMPTOMS, MAY REPEAT DOSE IN 2 HOURS IF NEEDED. (Patient taking differently: Take 10 mg by mouth See admin instructions. TAKE 1 TABLET BY MOUTH ONCE AT ONSET OF SYMPTOMS, MAY REPEAT DOSE IN 2 HOURS IF NEEDED.)  . vitamin C (ASCORBIC ACID) 500 MG tablet Take 500 mg by mouth daily.  . Wheat Dextrin (BENEFIBER PO) Take 1 Scoop by mouth daily.      Allergies:   Patient has no known allergies.   Social History  Socioeconomic History  . Marital status: Divorced    Spouse name: Not on file  . Number of children: 3  . Years of education: 47  . Highest education level: Not on file  Occupational History  . Occupation: Public house manager    Comment: [employer]  Tobacco Use  . Smoking status: Never Smoker  . Smokeless tobacco: Never Used  Substance and Sexual Activity  . Alcohol use: Yes    Comment: occasional  . Drug use: No  . Sexual activity: Not Currently    Birth control/protection: I.U.D.  Other Topics Concern  . Not on file  Social History Narrative   Lives with parents   Looking for own place   Tries to walk daily   Social Determinants of  Health   Financial Resource Strain:   . Difficulty of Paying Living Expenses:   Food Insecurity:   . Worried About Programme researcher, broadcasting/film/video in the Last Year:   . Barista in the Last Year:   Transportation Needs:   . Freight forwarder (Medical):   [name] Lack of Transportation (Non-Medical):   Physical Activity:   . Days of Exercise per Week:   . Minutes of Exercise per Session:   Stress:   . Feeling of Stress :   Social Connections:   . Frequency of Communication with Friends and Family:   . Frequency of Social Gatherings with Friends and Family:   . Attends Religious Services:   . Active Member of Clubs or Organizations:   . Attends Banker Meetings:   [name] Marital Status:      Family History:  The patient's family history includes Cancer in her father, paternal grandfather, and paternal grandmother; Colon cancer in her maternal aunt and maternal grandmother; Diabetes in her mother; Heart disease in her mother; Hyperlipidemia in her father and mother; Hypertension in her father, mother, and sister; Miscarriages / [place] in her mother; Other in her brother and mother.   ROS:   Please see the history of present illness.    ROS All other systems reviewed and are negative.   PHYSICAL EXAM:   VS:  BP (!) 142/84   Pulse 66   Temp (!) 97 F (36.1 C)   Ht 5\' 4"  (1.626 m)   Wt 271 lb (122.9 kg)   SpO2 97%   BMI 46.52 kg/m   Physical Exam  , in no acute distress  Neck: no JVD, carotid bruits, or masses Cardiac:RRR; no murmurs, rubs, or gallops  Respiratory:  clear to auscultation bilaterally, normal work of breathing GI: soft, nontender, nondistended, + BS Ext: without cyanosis, clubbing, or edema, Good distal pulses bilaterally Neuro:  Alert and Oriented x 3 Psych: euthymic mood, full affect  Wt Readings from Last 3 Encounters:  02/03/20 271 lb (122.9 kg)  01/22/20 265 lb 6.9 oz (120.4 kg)  01/21/20 265 lb 8 oz (120.4 kg)       Studies/Labs Reviewed:   EKG:  EKG is not ordered today.  The ekg reviewed from 01/22/2020 normal sinus rhythm with repolarization changes, no acute change from prior tracings Recent Labs: 01/22/2020: ALT 17; BUN 8; Creatinine, Ser 0.57; Hemoglobin 12.5; Platelets 289; Potassium 3.7; Sodium 137   Lipid Panel    Component Value Date/Time   CHOL 158 06/23/2017 1049   TRIG 66 06/23/2017 1049   HDL 49 (L) 06/23/2017 1049   CHOLHDL 3.2 06/23/2017 1049   LDLCALC 94 06/23/2017 1049    Additional studies/  records that were reviewed today include:      ASSESSMENT:    1. Palpitations   2. Stress   3. Anxiety   4. History of chest pain   5. Morbid obesity (HCC)   6. Gastroesophageal reflux disease, unspecified whether esophagitis present      PLAN:  In order of problems listed above:  Palpitations recent ER visit for such no arrhythmias documented and labs stable, EKG unchanged patient's PCP checked it TSH and free T3-4 which were both normal 01/25/2020 patient pulled up on her phone for me to see.  Stress and anxiety is definitely contributing.  Will get a 2-week monitor and give her low-dose Toprol-XL 25 mg 1/2 tablet daily.  Follow-up in 1 month with myself or Dr. Eden Emms   History of chest pain felt to be atypical and improved as anxiety improved  Anxiety depression on Celexa  Obesity exercise and weight loss recommended  GERD managed by PCP    Medication Adjustments/Labs and Tests Ordered: Current medicines are reviewed at length with the patient today.  Concerns regarding medicines are outlined above.  Medication changes, Labs and Tests ordered today are listed in the Patient Instructions below. There are no Patient Instructions on file for this visit.   Elson Clan, PA-C  02/03/2020 2:37 PM    [facility] [address] [place], [place], [place]  [address] Phone: [phone]; Fax: [phone]

## 2020-02-03 ENCOUNTER — Encounter: Payer: Self-pay | Admitting: Physician Assistant

## 2020-02-03 ENCOUNTER — Other Ambulatory Visit: Payer: Self-pay

## 2020-02-03 ENCOUNTER — Ambulatory Visit: Payer: BLUE CROSS/BLUE SHIELD | Admitting: Physician Assistant

## 2020-02-03 VITALS — BP 142/84 | HR 66 | Temp 97.0°F | Ht 64.0 in | Wt 271.0 lb

## 2020-02-03 DIAGNOSIS — R002 Palpitations: Secondary | ICD-10-CM

## 2020-02-03 DIAGNOSIS — F419 Anxiety disorder, unspecified: Secondary | ICD-10-CM

## 2020-02-03 DIAGNOSIS — F439 Reaction to severe stress, unspecified: Secondary | ICD-10-CM | POA: Diagnosis not present

## 2020-02-03 DIAGNOSIS — K219 Gastro-esophageal reflux disease without esophagitis: Secondary | ICD-10-CM

## 2020-02-03 DIAGNOSIS — Z87898 Personal history of other specified conditions: Secondary | ICD-10-CM | POA: Diagnosis not present

## 2020-02-03 MED ORDER — METOPROLOL SUCCINATE ER 25 MG PO TB24: 12.5000 mg | ORAL_TABLET | Freq: Every day | ORAL | 3 refills | Status: DC

## 2020-02-03 NOTE — Addendum Note (Signed)
Addended by: Kerney Elbe on: 02/03/2020 02:58 PM   Modules accepted: Orders

## 2020-02-03 NOTE — Patient Instructions (Signed)
Medication Instructions:  Your physician has recommended you make the following change in your medication:  Start Toprol XL 25 mg. Take 1/2 Tablet Daily.   *If you need a refill on your cardiac medications before your next appointment, please call your pharmacy*   Lab Work: NONE   If you have labs (blood work) drawn today and your tests are completely normal, you will receive your results only by: [name] MyChart Message (if you have MyChart) OR . A paper copy in the mail If you have any lab test that is abnormal or we need to change your treatment, we will call you to review the results.   Testing/Procedures: Your physician has recommended that you wear an event monitor. Event monitors are medical devices that record the heart's electrical activity. Doctors most often [place] these monitors to diagnose arrhythmias. Arrhythmias are problems with the speed or rhythm of the heartbeat. The monitor is a small, portable device. You can wear one while you do your normal daily activities. This is usually used to diagnose what is causing palpitations/syncope (passing out).     Follow-Up: At [facility], you and your health needs are our priority.  As part of our continuing mission to provide you with exceptional heart care, we have created designated Provider Care Teams.  These Care Teams include your primary Cardiologist (physician) and Advanced Practice Providers (APPs -  Physician Assistants and Nurse Practitioners) who all work together to provide you with the care you need, when you need it.  We recommend signing up for the patient portal called "MyChart".  Sign up information is provided on this After Visit Summary.  MyChart is used to connect with patients for Virtual Visits (Telemedicine).  Patients are able to view lab/test results, encounter notes, upcoming appointments, etc.  Non-urgent messages can be sent to your provider as well.   To learn more about what you can do with MyChart, go to  [email or web address].    Your next appointment:   1 month(s)  The format for your next appointment:   In Person  Provider:   You may see Charlton Haws, MD or one of the following Advanced Practice Providers on your designated Care Team:    Randall An, PA-C   Jacolyn Reedy, PA-C     Other Instructions Thank you for choosing [facility]!

## 2020-02-04 ENCOUNTER — Other Ambulatory Visit: Payer: BLUE CROSS/BLUE SHIELD | Admitting: Adult Health

## 2020-02-06 ENCOUNTER — Other Ambulatory Visit: Payer: BLUE CROSS/BLUE SHIELD | Admitting: Adult Health

## 2020-02-13 ENCOUNTER — Ambulatory Visit (INDEPENDENT_AMBULATORY_CARE_PROVIDER_SITE_OTHER): Payer: BLUE CROSS/BLUE SHIELD

## 2020-02-13 DIAGNOSIS — R002 Palpitations: Secondary | ICD-10-CM

## 2020-02-22 ENCOUNTER — Other Ambulatory Visit: Payer: Self-pay | Admitting: Gastroenterology

## 2020-03-09 ENCOUNTER — Other Ambulatory Visit: Payer: Self-pay

## 2020-03-18 ENCOUNTER — Telehealth: Payer: Self-pay | Admitting: Physician Assistant

## 2020-03-18 NOTE — Telephone Encounter (Signed)
The patient has been notified of the result and verbalized understanding.  Patient states that she feels fine and will wait to follow up in 6 months. Appointment made with Dr. Eden Emms on 12/20 in [facility]. All questions (if any) were answered. Lattie Haw, RN 03/18/2020 4:55 PM

## 2020-03-18 NOTE — Telephone Encounter (Signed)
    Pt is calling back to get heart monitor results

## 2020-03-18 NOTE — Telephone Encounter (Signed)
-----   Message from Laurann Montana, [place] sent at 03/10/2020  9:27 AM EDT ----- Covering Michele's inbox, chart reviewed. Pls let pt know monitor overall very reassuring. Very infrequent benign skips, no significant, frequent or dangerous arrhythmias. At last OV Elon Jester suggested f/u in 1 month so if patient wants appt, please arrange. Otherwise if she is feeling better, f/u 6 months.

## 2020-03-25 ENCOUNTER — Encounter: Payer: Self-pay | Admitting: Adult Health

## 2020-03-25 ENCOUNTER — Inpatient Hospital Stay (HOSPITAL_COMMUNITY): Admit: 2020-03-25 | Payer: BLUE CROSS/BLUE SHIELD

## 2020-03-25 ENCOUNTER — Other Ambulatory Visit: Payer: Self-pay

## 2020-03-25 ENCOUNTER — Ambulatory Visit (INDEPENDENT_AMBULATORY_CARE_PROVIDER_SITE_OTHER): Payer: BLUE CROSS/BLUE SHIELD | Admitting: Adult Health

## 2020-03-25 VITALS — BP 137/81 | HR 78 | Ht 64.0 in | Wt 273.0 lb

## 2020-03-25 DIAGNOSIS — Z975 Presence of (intrauterine) contraceptive device: Secondary | ICD-10-CM | POA: Diagnosis not present

## 2020-03-25 DIAGNOSIS — Z1211 Encounter for screening for malignant neoplasm of colon: Secondary | ICD-10-CM

## 2020-03-25 DIAGNOSIS — Z1212 Encounter for screening for malignant neoplasm of rectum: Secondary | ICD-10-CM | POA: Diagnosis not present

## 2020-03-25 DIAGNOSIS — Z01419 Encounter for gynecological examination (general) (routine) without abnormal findings: Secondary | ICD-10-CM | POA: Diagnosis not present

## 2020-03-25 DIAGNOSIS — L292 Pruritus vulvae: Secondary | ICD-10-CM | POA: Diagnosis not present

## 2020-03-25 LAB — HEMOCCULT GUIAC POC 1CARD (OFFICE): Fecal Occult Blood, POC: NEGATIVE

## 2020-03-25 MED ORDER — NYSTATIN-TRIAMCINOLONE 100000-0.1 UNIT/GM-% EX OINT: 1.0000 "application " | TOPICAL_OINTMENT | Freq: Two times a day (BID) | CUTANEOUS | 1 refills | Status: DC

## 2020-03-25 NOTE — Progress Notes (Signed)
Patient ID: Kristin [name], female   DOB: 02-08-1981, 39 y.o.   MRN: [id number] History of Present Illness: Kristin [name] is a 39 year old black female, divorced, G3P3,in for weill woman gyn exam and pap.She has mirena IUD placed 07/30/15. PCP is Cheron Every NP   Current Medications, Allergies, Past Medical History, Past Surgical History, Family History and Social History were reviewed in [employer] record.     Review of Systems: Patient denies any headaches, hearing loss, fatigue, blurred vision, shortness of breath, chest pain,  problems with bowel movements, urination, or intercourse. No joint pain or mood swings. +vulva itching +LLQ discomfort at times, had diarrhea today, to try benefiber every other day, sees Wynne Dust NP next week   Physical Exam:BP 137/81 (BP Location: Left Arm, Patient Position: Sitting, Cuff Size: Normal)    Pulse 78    Ht 5\' 4"  (1.626 m)    Wt 273 lb (123.8 kg)    BMI 46.86 kg/m  General:  Well developed, well nourished, no acute distress Skin:  Warm and dry Neck:  Midline trachea, normal thyroid, good ROM, no lymphadenopathy Lungs; Clear to auscultation bilaterally Breast:  No dominant palpable mass, retraction, or nipple discharge Cardiovascular: Regular rate and rhythm Abdomen:  Soft, non tender, no hepatosplenomegaly Pelvic:  External genitalia is normal in appearance, no lesions.  The vagina is normal in appearance. Urethra has no lesions or masses. The cervix is bulbous.No IUD strings seen, pap with high risk HPV 16/18 genotyping performed.  Uterus is felt to be normal size, shape, and contour.  No adnexal masses,LLQ  tenderness noted.Bladder is non tender, no masses felt. POC shows IUD in place, mid uterus. Rectal: Good sphincter tone, no polyps, or hemorrhoids felt.  Hemoccult negative. Extremities/musculoskeletal:  No swelling or varicosities noted, no clubbing or cyanosis Psych:  No mood changes, alert and cooperative,seems  happy AA 2 Fall risk is low PHQ 9 score is 3. Examination chaperoned by [place] LPN  Impression and Plan: 1. Encounter for gynecological examination with Papanicolaou smear of cervix Pap sent Physical in 1 year Pap in 3 if normal Mammogram yearly Labs with PCP  2. Screening for colorectal cancer Colonoscopy at 43  3. IUD (intrauterine device) in place  - she will probably replace IUD in October 2022  4. Vulvar itching Will rx mytrex cream Meds ordered this encounter  Medications   nystatin-triamcinolone ointment (MYCOLOG)    Sig: Apply 1 application topically 2 (two) times daily.    Dispense:  30 g    Refill:  1    Order Specific Question:   Supervising Provider    Answer:   November 2022 H [[id number]]

## 2020-03-27 LAB — CYTOLOGY - PAP
Comment: NEGATIVE
Diagnosis: UNDETERMINED — AB
High risk HPV: NEGATIVE

## 2020-03-30 ENCOUNTER — Encounter: Payer: Self-pay | Admitting: Adult Health

## 2020-03-30 DIAGNOSIS — R87610 Atypical squamous cells of undetermined significance on cytologic smear of cervix (ASC-US): Secondary | ICD-10-CM

## 2020-03-30 HISTORY — DX: Atypical squamous cells of undetermined significance on cytologic smear of cervix (ASC-US): R87.610

## 2020-04-01 ENCOUNTER — Ambulatory Visit: Payer: BLUE CROSS/BLUE SHIELD | Admitting: Nurse Practitioner

## 2020-04-15 ENCOUNTER — Ambulatory Visit: Payer: BLUE CROSS/BLUE SHIELD | Admitting: Nurse Practitioner

## 2020-04-16 ENCOUNTER — Ambulatory Visit: Payer: BLUE CROSS/BLUE SHIELD | Admitting: Nurse Practitioner

## 2020-04-29 ENCOUNTER — Telehealth: Payer: Self-pay | Admitting: Cardiovascular Disease

## 2020-04-29 NOTE — Telephone Encounter (Signed)
New message    Patient states she was moving furniture over the weekend and got the feeling of her heart racing, after a little while she started getting dizzy and a severe headache , that she had to take a migraine medication for.   Patient said she drank 3 bottles of water and it seemed to help a little bit, but since then she has been feeling very weak and her heart rate that is normally in the 70's is staying in the low 60's and she just isnt feeling right.  Please call , I set her an appt for Tuesday 05/05/20 1030a with Nena Polio in [place].

## 2020-04-29 NOTE — Telephone Encounter (Signed)
Pt states that she has been cold her resting HR is 58 and she has pain to left ear. She denies SOB but states that she has fullness in her chest like she does when she has acid reflux. Please advise.

## 2020-04-30 ENCOUNTER — Emergency Department (HOSPITAL_COMMUNITY): Payer: BLUE CROSS/BLUE SHIELD

## 2020-04-30 ENCOUNTER — Other Ambulatory Visit: Payer: Self-pay

## 2020-04-30 ENCOUNTER — Emergency Department (HOSPITAL_COMMUNITY)
Admission: EM | Admit: 2020-04-30 | Discharge: 2020-04-30 | Disposition: A | Discharge status: Home / Self Care | Payer: BLUE CROSS/BLUE SHIELD | Attending: Emergency Medicine | Admitting: Emergency Medicine

## 2020-04-30 ENCOUNTER — Encounter (HOSPITAL_COMMUNITY): Payer: Self-pay | Admitting: *Deleted

## 2020-04-30 DIAGNOSIS — R531 Weakness: Secondary | ICD-10-CM | POA: Insufficient documentation

## 2020-04-30 DIAGNOSIS — Z5321 Procedure and treatment not carried out due to patient leaving prior to being seen by health care provider: Secondary | ICD-10-CM | POA: Insufficient documentation

## 2020-04-30 DIAGNOSIS — R519 Headache, unspecified: Secondary | ICD-10-CM | POA: Diagnosis present

## 2020-04-30 DIAGNOSIS — R079 Chest pain, unspecified: Secondary | ICD-10-CM | POA: Insufficient documentation

## 2020-04-30 LAB — CBC
HCT: 42.3 % (ref 36.0–46.0)
Hemoglobin: 13.1 g/dL (ref 12.0–15.0)
MCH: 26.8 pg (ref 26.0–34.0)
MCHC: 31 g/dL (ref 30.0–36.0)
MCV: 86.5 fL (ref 80.0–100.0)
Platelets: 311 10*3/uL (ref 150–400)
RBC: 4.89 MIL/uL (ref 3.87–5.11)
RDW: 13.2 % (ref 11.5–15.5)
WBC: 6.8 10*3/uL (ref 4.0–10.5)
nRBC: 0 % (ref 0.0–0.2)

## 2020-04-30 LAB — BASIC METABOLIC PANEL
Anion gap: 8 (ref 5–15)
BUN: 8 mg/dL (ref 6–20)
CO2: 27 mmol/L (ref 22–32)
Calcium: 8.9 mg/dL (ref 8.9–10.3)
Chloride: 102 mmol/L (ref 98–111)
Creatinine, Ser: 0.67 mg/dL (ref 0.44–1.00)
GFR calc Af Amer: >60 mL/min (ref >60)
GFR calc non Af Amer: >60 mL/min (ref >60)
Glucose, Bld: 94 mg/dL (ref 70–99)
Potassium: 3.5 mmol/L (ref 3.5–5.1)
Sodium: 137 mmol/L (ref 135–145)

## 2020-04-30 LAB — TROPONIN I (HIGH SENSITIVITY): Troponin I (High Sensitivity): <2 ng/L (ref <18)

## 2020-04-30 MED ORDER — SODIUM CHLORIDE 0.9% FLUSH: 3.0000 mL | Freq: Once | INTRAVENOUS | Status: DC

## 2020-04-30 NOTE — Telephone Encounter (Signed)
Pt notified and voiced understanding 

## 2020-04-30 NOTE — Telephone Encounter (Signed)
HR is fine don't think her reflux symptoms are worrisome f/u primary

## 2020-04-30 NOTE — ED Triage Notes (Signed)
Chest pain , headache and feeling weak

## 2020-04-30 NOTE — ED Notes (Signed)
Not available when called for room assignment 

## 2020-05-02 ENCOUNTER — Other Ambulatory Visit: Payer: Self-pay

## 2020-05-02 ENCOUNTER — Ambulatory Visit
Admission: EM | Admit: 2020-05-02 | Discharge: 2020-05-02 | Disposition: A | Discharge status: Home / Self Care | Payer: BLUE CROSS/BLUE SHIELD | Attending: Physician Assistant | Admitting: Physician Assistant

## 2020-05-02 ENCOUNTER — Encounter: Payer: Self-pay | Admitting: Emergency Medicine

## 2020-05-02 DIAGNOSIS — R599 Enlarged lymph nodes, unspecified: Secondary | ICD-10-CM

## 2020-05-02 MED ORDER — AMOXICILLIN 500 MG PO CAPS
500.0000 mg | ORAL_CAPSULE | Freq: Two times a day (BID) | ORAL | 0 refills | Status: DC
Start: 2020-05-02 — End: 2020-05-03

## 2020-05-02 NOTE — Discharge Instructions (Addendum)
You appear to have a reactive node, given the duration will cover with an antibiotic. If viral this may not be helpful, and can take another week of so to help. Use Ibuprofen as needed for swelling and discomfort.

## 2020-05-02 NOTE — ED Triage Notes (Signed)
LT ear pain that started on Monday. Yesterday morning pain and swelling to LT side of neck.

## 2020-05-02 NOTE — ED Provider Notes (Signed)
[facility]    CSN: [id number] Arrival date & time: 05/02/20  1519      History   Chief Complaint Chief Complaint  Patient presents with  . Otalgia    HPI Kristin [name] is a 39 y.o. female.   Presents with a 5-6 day history of swelling in her left nodes with tenderness that runs from her left ear to throat. No fever or chills. No cough or congestion.      Past Medical History:  Diagnosis Date  . Allergy   . Anxiety   . Atypical squamous cell changes of undetermined significance (ASCUS) on cervical cytology with negative high risk human papilloma virus (HPV) test result 03/30/2020   6/21 repeat in 3 years ASCCP guidelines 5 year risk CIN 3+ 0.27%  . Carpal tunnel syndrome of right wrist 10/22/2018  . Contraceptive management 11/05/2013  . Depression   . GERD (gastroesophageal reflux disease)   . IUD (intrauterine device) in place 01/13/2016  . Migraines   . Missed periods 01/13/2016  . Vaginal Pap smear, abnormal     Patient Active Problem List   Diagnosis Date Noted  . Atypical squamous cell changes of undetermined significance (ASCUS) on cervical cytology with negative high risk human papilloma virus (HPV) test result 03/30/2020  . Vulvar itching 03/25/2020  . Abdominal pain 01/21/2020  . Shakiness 01/21/2020  . Left flank pain 11/13/2019  . Bloating 11/13/2019  . Urinary frequency 11/13/2019  . Loss of weight 06/12/2019  . Boil of groin 05/03/2019  . Lower abdominal pain 04/19/2019  . Pain with urination 04/19/2019  . GERD (gastroesophageal reflux disease) 03/18/2019  . Change in stool habits 03/18/2019  . Abnormal CT of the abdomen 03/18/2019  . Boil, leg 03/04/2019  . Screening for colorectal cancer 10/22/2018  . Screening for STDs (sexually transmitted diseases) 10/22/2018  . Encounter for gynecological examination with Papanicolaou smear of cervix 10/22/2018  . Family planning 10/22/2018  . Plantar fasciitis, right 10/22/2018  . Carpal  tunnel syndrome of right wrist 10/22/2018  . IUD (intrauterine device) in place 01/13/2016  . Migraines 03/07/2013    Past Surgical History:  Procedure Laterality Date  . COLONOSCOPY N/A 05/10/2019   Procedure: COLONOSCOPY;  Surgeon: Corbin Ade, MD;  Location: [facility];  Service: Endoscopy;  Laterality: N/A;  1:00pm  . ESOPHAGOGASTRODUODENOSCOPY N/A 05/10/2019   Procedure: ESOPHAGOGASTRODUODENOSCOPY (EGD);  Surgeon: Corbin Ade, MD;  Location: [facility];  Service: Endoscopy;  Laterality: N/A;  . NO PAST SURGERIES      OB History    Gravida  3   Para  3   Term  3   Preterm      AB      Living  3     SAB      TAB      Ectopic      Multiple  0   Live Births  3            Home Medications    Prior to Admission medications   Medication Sig Start Date End Date Taking? Authorizing Provider  cholecalciferol (VITAMIN D3) 25 MCG (1000 UT) tablet Take 1,000 Units by mouth daily.    [provider]  fluticasone (FLONASE) 50 MCG/ACT nasal spray Place 2 sprays into both nostrils daily.  12/25/18   [provider]  levonorgestrel (MIRENA) 20 MCG/24HR IUD 1 each by Intrauterine route once.    [provider]  nystatin-triamcinolone ointment (MYCOLOG) Apply 1 application  topically 2 (two) times daily. 03/25/20   Adline Potter, NP  rizatriptan (MAXALT) 10 MG tablet TAKE 1 TABLET BY MOUTH ONCE AT ONSET OF SYMPTOMS, MAY REPEAT DOSE IN 2 HOURS IF NEEDED. Patient taking differently: Take 10 mg by mouth See admin instructions. TAKE 1 TABLET BY MOUTH ONCE AT ONSET OF SYMPTOMS, MAY REPEAT DOSE IN 2 HOURS IF NEEDED. 01/15/18   Eustace Moore, MD  vitamin C (ASCORBIC ACID) 500 MG tablet Take 500 mg by mouth daily.    [provider]  Wheat Dextrin (BENEFIBER PO) Take 1 Scoop by mouth daily.  Patient not taking: Reported on 03/25/2020    [provider]    Family History Family History  Problem Relation Age of Onset  .  Other Mother        enlarged heart  . Diabetes Mother   . Hypertension Mother   . Hyperlipidemia Mother   . Heart disease Mother        heart murmer  . Miscarriages / [place] Mother   . Hypertension Father   . Hyperlipidemia Father   . Cancer Father        prostate  . Other Brother        enlarged heart; colloid cyst of the third ventricle( of the brain)  . Cancer Paternal Grandmother        leukemia  . Cancer Paternal Grandfather        lung  . Hypertension Sister   . Colon cancer Maternal Grandmother   . Colon cancer Maternal Aunt     Social History Social History   Tobacco Use  . Smoking status: Never Smoker  . Smokeless tobacco: Never Used  Vaping Use  . Vaping Use: Never used  Substance Use Topics  . Alcohol use: Yes    Comment: occasional  . Drug use: No     Allergies   Patient has no known allergies.   Review of Systems Review of Systems  All other systems reviewed and are negative.    Physical Exam Triage Vital Signs ED Triage Vitals  Enc Vitals Group     BP 05/02/20 1525 122/74     Pulse Rate 05/02/20 1525 75     Resp 05/02/20 1525 17     Temp 05/02/20 1525 98.6 F (37 C)     Temp Source 05/02/20 1525 Oral     SpO2 05/02/20 1525 98 %     Weight 05/02/20 1523 (!) 275 lb (124.7 kg)     Height 05/02/20 1523 5\' 4"  (1.626 m)     Head Circumference --      Peak Flow --      Pain Score 05/02/20 1523 5     Pain Loc --      Pain Edu? --      Excl. in GC? --    No data found.  Updated Vital Signs BP 122/74 (BP Location: Right Arm)   Pulse 75   Temp 98.6 F (37 C) (Oral)   Resp 17   Ht 5\' 4"  (1.626 m)   Wt (!) 275 lb (124.7 kg)   SpO2 98%   BMI 47.20 kg/m   Visual Acuity Right Eye Distance:   Left Eye Distance:   Bilateral Distance:    Right Eye Near:   Left Eye Near:    Bilateral Near:     Physical Exam Vitals and nursing note reviewed.  Constitutional:      General: She is not in acute  distress.    Appearance: Normal  appearance. She is not ill-appearing, toxic-appearing or diaphoretic.  HENT:     Head: Normocephalic and atraumatic.     Right Ear: Tympanic membrane normal.     Left Ear: Tympanic membrane normal.     Mouth/Throat:     Mouth: Mucous membranes are moist.     Pharynx: No oropharyngeal exudate or posterior oropharyngeal erythema.  Cardiovascular:     Rate and Rhythm: Normal rate and regular rhythm.  Pulmonary:     Effort: Pulmonary effort is normal.     Breath sounds: Normal breath sounds.  Musculoskeletal:     Cervical back: Normal range of motion. Tenderness present.  Lymphadenopathy:     Cervical: Cervical adenopathy present.  Neurological:     Mental Status: She is alert.  Psychiatric:        Mood and Affect: Mood normal.        Behavior: Behavior normal.      UC Treatments / Results  Labs (all labs ordered are listed, but only abnormal results are displayed) Labs Reviewed - No data to display  EKG   Radiology No results found.  Procedures Procedures (including critical care time)  Medications Ordered in UC Medications - No data to display  Initial Impression / Assessment and Plan / UC Course  I have reviewed the triage vital signs and the nursing notes.  Pertinent labs & imaging results that were available during my care of the patient were reviewed by me and considered in my medical decision making (see chart for details).     Most likely reactive adenopathy-treat with ABX and follow. If worsens f/u.  Final Clinical Impressions(s) / UC Diagnoses   Final diagnoses:  None   Discharge Instructions   None    ED Prescriptions    None     PDMP not reviewed this encounter.   Riki Sheer, PA-C 05/02/20 1540

## 2020-05-03 ENCOUNTER — Encounter: Payer: Self-pay | Admitting: Emergency Medicine

## 2020-05-03 ENCOUNTER — Ambulatory Visit
Admission: EM | Admit: 2020-05-03 | Discharge: 2020-05-03 | Disposition: A | Discharge status: Home / Self Care | Payer: BLUE CROSS/BLUE SHIELD | Attending: Family Medicine | Admitting: Family Medicine

## 2020-05-03 DIAGNOSIS — K1379 Other lesions of oral mucosa: Secondary | ICD-10-CM

## 2020-05-03 MED ORDER — SULFAMETHOXAZOLE-TRIMETHOPRIM 800-160 MG PO TABS
1.0000 | ORAL_TABLET | Freq: Two times a day (BID) | ORAL | 0 refills | Status: AC
Start: 2020-05-03 — End: 2020-05-10

## 2020-05-03 MED ORDER — SULFAMETHOXAZOLE-TRIMETHOPRIM 800-160 MG PO TABS
1.0000 | ORAL_TABLET | Freq: Two times a day (BID) | ORAL | 0 refills | Status: DC
Start: 2020-05-03 — End: 2020-05-03

## 2020-05-03 MED ORDER — FLUCONAZOLE 200 MG PO TABS: 200.0000 mg | ORAL_TABLET | Freq: Once | ORAL | 0 refills | Status: AC

## 2020-05-03 MED ORDER — FLUCONAZOLE 200 MG PO TABS: 200.0000 mg | ORAL_TABLET | Freq: Once | ORAL | 0 refills | Status: DC

## 2020-05-03 NOTE — ED Triage Notes (Signed)
Pt states she feels like her uvula is swollen since she woke up this morning.  States it is hard to swallow.  Seen here yesterday and given abx.

## 2020-05-03 NOTE — ED Provider Notes (Signed)
[facility]    CSN: [id number] Arrival date & time: 05/03/20  1337      History   Chief Complaint Chief Complaint  Patient presents with  . Oral Swelling    HPI Kristin [name] is a 39 y.o. female.   Reports that she has been experiencing uvular swelling since this morning. Reports that she was seen in this office yesterday and prescribed amoxicillin. Denies trouble swallowing, trouble breathing, throat or tongue itching. Denies ever having this happen before.   ROS per HPI  The history is provided by the patient.    Past Medical History:  Diagnosis Date  . Allergy   . Anxiety   . Atypical squamous cell changes of undetermined significance (ASCUS) on cervical cytology with negative high risk human papilloma virus (HPV) test result 03/30/2020   6/21 repeat in 3 years ASCCP guidelines 5 year risk CIN 3+ 0.27%  . Carpal tunnel syndrome of right wrist 10/22/2018  . Contraceptive management 11/05/2013  . Depression   . GERD (gastroesophageal reflux disease)   . IUD (intrauterine device) in place 01/13/2016  . Migraines   . Missed periods 01/13/2016  . Vaginal Pap smear, abnormal     Patient Active Problem List   Diagnosis Date Noted  . Atypical squamous cell changes of undetermined significance (ASCUS) on cervical cytology with negative high risk human papilloma virus (HPV) test result 03/30/2020  . Vulvar itching 03/25/2020  . Abdominal pain 01/21/2020  . Shakiness 01/21/2020  . Left flank pain 11/13/2019  . Bloating 11/13/2019  . Urinary frequency 11/13/2019  . Loss of weight 06/12/2019  . Boil of groin 05/03/2019  . Lower abdominal pain 04/19/2019  . Pain with urination 04/19/2019  . GERD (gastroesophageal reflux disease) 03/18/2019  . Change in stool habits 03/18/2019  . Abnormal CT of the abdomen 03/18/2019  . Boil, leg 03/04/2019  . Screening for colorectal cancer 10/22/2018  . Screening for STDs (sexually transmitted diseases) 10/22/2018  .  Encounter for gynecological examination with Papanicolaou smear of cervix 10/22/2018  . Family planning 10/22/2018  . Plantar fasciitis, right 10/22/2018  . Carpal tunnel syndrome of right wrist 10/22/2018  . IUD (intrauterine device) in place 01/13/2016  . Migraines 03/07/2013    Past Surgical History:  Procedure Laterality Date  . COLONOSCOPY N/A 05/10/2019   Procedure: COLONOSCOPY;  Surgeon: Corbin Adeourk, Robert M, MD;  Location: [facility];  Service: Endoscopy;  Laterality: N/A;  1:00pm  . ESOPHAGOGASTRODUODENOSCOPY N/A 05/10/2019   Procedure: ESOPHAGOGASTRODUODENOSCOPY (EGD);  Surgeon: Corbin Adeourk, Robert M, MD;  Location: [facility];  Service: Endoscopy;  Laterality: N/A;  . NO PAST SURGERIES      OB History    Gravida  3   Para  3   Term  3   Preterm      AB      Living  3     SAB      TAB      Ectopic      Multiple  0   Live Births  3            Home Medications    Prior to Admission medications   Medication Sig Start Date End Date Taking? Authorizing Provider  cholecalciferol (VITAMIN D3) 25 MCG (1000 UT) tablet Take 1,000 Units by mouth daily.    [provider]  fluconazole (DIFLUCAN) 200 MG tablet Take 1 tablet (200 mg total) by mouth once for 1 dose. 05/03/20 05/03/20  Moshe CiproMatthews, Katha Kuehne, NP  fluticasone (  FLONASE) 50 MCG/ACT nasal spray Place 2 sprays into both nostrils daily.  12/25/18   [provider]  levonorgestrel (MIRENA) 20 MCG/24HR IUD 1 each by Intrauterine route once.    [provider]  nystatin-triamcinolone ointment (MYCOLOG) Apply 1 application topically 2 (two) times daily. 03/25/20   Adline Potter, NP  rizatriptan (MAXALT) 10 MG tablet TAKE 1 TABLET BY MOUTH ONCE AT ONSET OF SYMPTOMS, MAY REPEAT DOSE IN 2 HOURS IF NEEDED. Patient taking differently: Take 10 mg by mouth See admin instructions. TAKE 1 TABLET BY MOUTH ONCE AT ONSET OF SYMPTOMS, MAY REPEAT DOSE IN 2 HOURS IF NEEDED. 01/15/18   Eustace Moore, MD    sulfamethoxazole-trimethoprim (BACTRIM DS) 800-160 MG tablet Take 1 tablet by mouth 2 (two) times daily for 7 days. 05/03/20 05/10/20  Moshe Cipro, NP  vitamin C (ASCORBIC ACID) 500 MG tablet Take 500 mg by mouth daily.    [provider]  Wheat Dextrin (BENEFIBER PO) Take 1 Scoop by mouth daily.  Patient not taking: Reported on 03/25/2020    [provider]    Family History Family History  Problem Relation Age of Onset  . Other Mother        enlarged heart  . Diabetes Mother   . Hypertension Mother   . Hyperlipidemia Mother   . Heart disease Mother        heart murmer  . Miscarriages / [place] Mother   . Hypertension Father   . Hyperlipidemia Father   . Cancer Father        prostate  . Other Brother        enlarged heart; colloid cyst of the third ventricle( of the brain)  . Cancer Paternal Grandmother        leukemia  . Cancer Paternal Grandfather        lung  . Hypertension Sister   . Colon cancer Maternal Grandmother   . Colon cancer Maternal Aunt     Social History Social History   Tobacco Use  . Smoking status: Never Smoker  . Smokeless tobacco: Never Used  Vaping Use  . Vaping Use: Never used  Substance Use Topics  . Alcohol use: Yes    Comment: occasional  . Drug use: No     Allergies   Penicillins   Review of Systems Review of Systems   Physical Exam Triage Vital Signs ED Triage Vitals  Enc Vitals Group     BP 05/03/20 1354 122/79     Pulse Rate 05/03/20 1354 76     Resp 05/03/20 1354 17     Temp 05/03/20 1354 98.5 F (36.9 C)     Temp Source 05/03/20 1354 Oral     SpO2 05/03/20 1354 98 %     Weight 05/03/20 1353 (!) 275 lb (124.7 kg)     Height 05/03/20 1353 5\' 4"  (1.626 m)     Head Circumference --      Peak Flow --      Pain Score 05/03/20 1352 4     Pain Loc --      Pain Edu? --      Excl. in GC? --    No data found.  Updated Vital Signs BP 122/79 (BP Location: Right Arm)   Pulse 76   Temp 98.5 F  (36.9 C) (Oral)   Resp 17   Ht 5\' 4"  (1.626 m)   Wt (!) 275 lb (124.7 kg)   SpO2 98%   BMI  47.20 kg/m   Visual Acuity Right Eye Distance:   Left Eye Distance:   Bilateral Distance:    Right Eye Near:   Left Eye Near:    Bilateral Near:     Physical Exam Vitals and nursing note reviewed.  Constitutional:      General: She is not in acute distress.    Appearance: She is well-developed. She is not ill-appearing.  HENT:     Head: Normocephalic and atraumatic.     Mouth/Throat:     Comments: Uvular erythema, mild swelling, no tonsillar swelling, no tonsillar exudate Eyes:     Conjunctiva/sclera: Conjunctivae normal.  Cardiovascular:     Rate and Rhythm: Normal rate and regular rhythm.     Heart sounds: No murmur heard.   Pulmonary:     Effort: Pulmonary effort is normal. No respiratory distress.     Breath sounds: Normal breath sounds. No stridor. No wheezing, rhonchi or rales.  Chest:     Chest wall: No tenderness.  Abdominal:     General: Bowel sounds are normal. There is no distension.     Palpations: Abdomen is soft. There is no mass.     Tenderness: There is no abdominal tenderness. There is no right CVA tenderness, left CVA tenderness, guarding or rebound.     Hernia: No hernia is present.  Musculoskeletal:        General: Normal range of motion.     Cervical back: Normal range of motion and neck supple.  Skin:    General: Skin is warm and dry.     Capillary Refill: Capillary refill takes less than 2 seconds.  Neurological:     General: No focal deficit present.     Mental Status: She is alert and oriented to person, place, and time.  Psychiatric:        Mood and Affect: Mood normal.        Behavior: Behavior normal.        Thought Content: Thought content normal.      UC Treatments / Results  Labs (all labs ordered are listed, but only abnormal results are displayed) Labs Reviewed - No data to display  EKG   Radiology No results  found.  Procedures Procedures (including critical care time)  Medications Ordered in UC Medications - No data to display  Initial Impression / Assessment and Plan / UC Course  I have reviewed the triage vital signs and the nursing notes.  Pertinent labs & imaging results that were available during my care of the patient were reviewed by me and considered in my medical decision making (see chart for details).     Uvular swelling  Stop the amoxicillin Take benadryl  Prescribed Cefdinir Prescribed fluconazole in case of yeast Follow up with this office or with primary care as needed Follow up with the ER for trouble swallowing, trouble breathing, any mouth, tongue, throat itching or swelling, other concerning symptoms Final Clinical Impressions(s) / UC Diagnoses   Final diagnoses:  Uvular swelling     Discharge Instructions     Take Bactrim twice a day for the next 7 days  Take the diflucan at the onset of yeast symptoms, if symptoms are still present 3 days later  I think you having a reaction to the amoxicillin  You may use benadryl at night, zyrtec or claritin during the day  Follow up with this office or with primary care as needed  Follow up with the ER for increased swelling, trouble  swallowing, trouble breathing, other concerning symptoms     ED Prescriptions    Medication Sig Dispense Auth. Provider   sulfamethoxazole-trimethoprim (BACTRIM DS) 800-160 MG tablet  (Status: Discontinued) Take 1 tablet by mouth 2 (two) times daily for 7 days. 14 tablet Moshe Cipro, NP   fluconazole (DIFLUCAN) 200 MG tablet  (Status: Discontinued) Take 1 tablet (200 mg total) by mouth once for 1 dose. 2 tablet Moshe Cipro, NP   fluconazole (DIFLUCAN) 200 MG tablet Take 1 tablet (200 mg total) by mouth once for 1 dose. 2 tablet Moshe Cipro, NP   sulfamethoxazole-trimethoprim (BACTRIM DS) 800-160 MG tablet Take 1 tablet by mouth 2 (two) times daily for 7 days.  14 tablet Moshe Cipro, NP     PDMP not reviewed this encounter.   Moshe Cipro, NP 05/03/20 [phone]

## 2020-05-03 NOTE — Discharge Instructions (Addendum)
Take Bactrim twice a day for the next 7 days  Take the diflucan at the onset of yeast symptoms, if symptoms are still present 3 days later  I think you having a reaction to the amoxicillin  You may use benadryl at night, zyrtec or claritin during the day  Follow up with this office or with primary care as needed  Follow up with the ER for increased swelling, trouble swallowing, trouble breathing, other concerning symptoms

## 2020-05-05 ENCOUNTER — Ambulatory Visit: Payer: BLUE CROSS/BLUE SHIELD | Admitting: Family Medicine

## 2020-05-14 DIAGNOSIS — M7052 Other bursitis of knee, left knee: Secondary | ICD-10-CM | POA: Insufficient documentation

## 2020-05-18 ENCOUNTER — Ambulatory Visit: Payer: BLUE CROSS/BLUE SHIELD | Admitting: Family Medicine

## 2020-05-18 NOTE — Progress Notes (Deleted)
Cardiology Office Note  Date: 05/18/2020   ID: Kristin [name], DOB [birth year], MRN [id number]  PCP:  Erasmo Downer, NP  Cardiologist:  Charlton Haws, MD Electrophysiologist:  None   Chief Complaint: Palpitations  History of Present Illness: Kristin [name] is a 39 y.o. female with a history of palpitations.  Previously seen Dr. Eden Emms in 2018 for palpitations.  She was under a lot of stress at that time.  She had Inderal 10 mg as needed and was to call if she wanted to monitor.  Had seen Dr. Loleta Dicker again in 10/07/2019 after ER visit for chest pain felt to be atypical.  The pain improved as anxiety improved.  ER visit 01/22/2020 with palpitations.  No arrhythmias noted on monitoring.  Her EKG had repolarization changes, troponins were normal.  She stated that the palpitations started a week before after she would eat.  She would feel jittery and shaky then palpitations.  States it awakened her in the early hours of the morning.  She got up and walked in perform deep breathing.  Her heart rate was 130s.  The palpitations have happened the previous Sunday before visit with [name] on 02/03/2020.  She is drinking 1 cup of coffee per day.  She was previously taking Celexa and had stopped but had started back.  Her TSH and T3-T4  Past Medical History:  Diagnosis Date  . Allergy   . Anxiety   . Atypical squamous cell changes of undetermined significance (ASCUS) on cervical cytology with negative high risk human papilloma virus (HPV) test result 03/30/2020   6/21 repeat in 3 years ASCCP guidelines 5 year risk CIN 3+ 0.27%  . Carpal tunnel syndrome of right wrist 10/22/2018  . Contraceptive management 11/05/2013  . Depression   . GERD (gastroesophageal reflux disease)   . IUD (intrauterine device) in place 01/13/2016  . Migraines   . Missed periods 01/13/2016  . Vaginal Pap smear, abnormal     Past Surgical History:  Procedure Laterality Date  . COLONOSCOPY N/A 05/10/2019    Procedure: COLONOSCOPY;  Surgeon: Corbin Ade, MD;  [facility];  Service: Endoscopy;  Laterality: N/A;  1:00pm  . ESOPHAGOGASTRODUODENOSCOPY N/A 05/10/2019   Procedure: ESOPHAGOGASTRODUODENOSCOPY (EGD);  Surgeon: Corbin Ade, MD;  [facility];  Service: Endoscopy;  Laterality: N/A;  . NO PAST SURGERIES      Current Outpatient Medications  Medication Sig Dispense Refill  . cholecalciferol (VITAMIN D3) 25 MCG (1000 UT) tablet Take 1,000 Units by mouth daily.    . fluticasone (FLONASE) 50 MCG/ACT nasal spray Place 2 sprays into both nostrils daily.     [name] levonorgestrel (MIRENA) 20 MCG/24HR IUD 1 each by Intrauterine route once.    . nystatin-triamcinolone ointment (MYCOLOG) Apply 1 application topically 2 (two) times daily. 30 g 1  . rizatriptan (MAXALT) 10 MG tablet TAKE 1 TABLET BY MOUTH ONCE AT ONSET OF SYMPTOMS, MAY REPEAT DOSE IN 2 HOURS IF NEEDED. (Patient taking differently: Take 10 mg by mouth See admin instructions. TAKE 1 TABLET BY MOUTH ONCE AT ONSET OF SYMPTOMS, MAY REPEAT DOSE IN 2 HOURS IF NEEDED.) 9 tablet 0  . vitamin C (ASCORBIC ACID) 500 MG tablet Take 500 mg by mouth daily.    . Wheat Dextrin (BENEFIBER PO) Take 1 Scoop by mouth daily.  (Patient not taking: Reported on 03/25/2020)     No current facility-administered medications for this visit.   Allergies:  Penicillins   Social History: The  patient  reports that she has never smoked. She has never used smokeless tobacco. She reports current alcohol use. She reports that she does not use drugs.   Family History: The patient's family history includes Cancer in her father, paternal grandfather, and paternal grandmother; Colon cancer in her maternal aunt and maternal grandmother; Diabetes in her mother; Heart disease in her mother; Hyperlipidemia in her father and mother; Hypertension in her father, mother, and sister; Miscarriages / [place] in her mother; Other in her brother and mother.   ROS:   Please see the history of present illness. Otherwise, complete review of systems is positive for {NONE DEFAULTED:18576::"none"}.  All other systems are reviewed and negative.   Physical Exam: VS:  There were no vitals taken for this visit., BMI There is no height or weight on file to calculate BMI.  Wt Readings from Last 3 Encounters:  05/03/20 (!) 275 lb (124.7 kg)  05/02/20 (!) 275 lb (124.7 kg)  03/25/20 273 lb (123.8 kg)    General: Patient appears comfortable at rest. HEENT: Conjunctiva and lids normal, oropharynx clear with moist mucosa. Neck: Supple, no elevated JVP or carotid bruits, no thyromegaly. Lungs: Clear to auscultation, nonlabored breathing at rest. Cardiac: Regular rate and rhythm, no S3 or significant systolic murmur, no pericardial rub. Abdomen: Soft, nontender, no hepatomegaly, bowel sounds present, no guarding or rebound. Extremities: No pitting edema, distal pulses 2+. Skin: Warm and dry. Musculoskeletal: No kyphosis. Neuropsychiatric: Alert and oriented x3, affect grossly appropriate.  ECG:  {EKG/Telemetry Strips Reviewed:[phone]}  Recent Labwork: 01/22/2020: ALT 17; AST 13 04/30/2020: BUN 8; Creatinine, Ser 0.67; Hemoglobin 13.1; Platelets 311; Potassium 3.5; Sodium 137     Component Value Date/Time   CHOL 158 06/23/2017 1049   TRIG 66 06/23/2017 1049   HDL 49 (L) 06/23/2017 1049   CHOLHDL 3.2 06/23/2017 1049   LDLCALC 94 06/23/2017 1049    Other Studies Reviewed Today:   Assessment and Plan:  No diagnosis found.   Medication Adjustments/Labs and Tests Ordered: Current medicines are reviewed at length with the patient today.  Concerns regarding medicines are outlined above.   Disposition: Follow-up with ***  Signed, Rennis Harding, NP 05/18/2020 1:40 PM    [facility] at [facility] [address] [place], [place], [place] [address] Phone: [phone]; Fax: [phone]

## 2020-05-28 ENCOUNTER — Ambulatory Visit: Payer: BLUE CROSS/BLUE SHIELD | Admitting: Nurse Practitioner

## 2020-05-28 ENCOUNTER — Other Ambulatory Visit: Payer: Self-pay

## 2020-05-28 ENCOUNTER — Encounter: Payer: Self-pay | Admitting: Nurse Practitioner

## 2020-05-28 VITALS — BP 134/89 | HR 72 | Temp 97.1°F | Ht 64.0 in | Wt 283.0 lb

## 2020-05-28 DIAGNOSIS — R1012 Left upper quadrant pain: Secondary | ICD-10-CM

## 2020-05-28 DIAGNOSIS — K219 Gastro-esophageal reflux disease without esophagitis: Secondary | ICD-10-CM

## 2020-05-28 NOTE — Progress Notes (Addendum)
Referring Provider: Erasmo Downer, NP Primary Care Physician:  Erasmo Downer, NP Primary GI:  Dr. Jena Gauss  Chief Complaint  Patient presents with  . Abdominal Pain    left  . Gastroesophageal Reflux    doing well    HPI:   Kristin [name] is a 39 y.o. female who presents for follow-up on GERD and abdominal pain.  Patient was last seen in our office 01/21/2020 for GERD and left upper quadrant abdominal pain as well as "shakiness".  Noted history of GERD, abdominal pain, weight loss.  CT of the abdomen in 2020 with soft tissue density near the GE junction, gaseous distention of the distal esophagus with remaining portions of the stomach unremarkable.  Also noted oral contrast seen to the level of the splenic flexure with the descending colon and splenic flexure decompressed and poorly evaluated with inability to exclude infectious or inflammatory colitis.  Previously MiraLAX effective for constipation.  Last colonoscopy 04/22/2019 with entire colon normal recommended 10-year repeat.  EGD the same day found normal esophagus, hiatal hernia, otherwise normal.  Recommended trial of AcipHex 20 mg daily.  Previous work-up for left-sided abdominal pain including normal pelvic ultrasound, CT with 1 mm nonobstructive kidney stone, abdominal ultrasound with no acute abnormality.  At her last visit noted episodes of shakiness and weakness like she is going to pass out, typically after she eats.  Typically starts postprandial and can last up to all day but typically self resolves.  Specific foods such as apple or peanut butter seems to be somewhat triggering.  Poor appetite, 14 pound weight loss in the last month subjectively (objectively down 9 pounds in 3 months).  Dull left-sided abdominal/flank pain but unknown triggers.  Some nausea with her "shaky" symptoms but no vomiting.  No other overt GI complaints.  At the end of her visit she noted it felt like her GERD is getting worse, try to eat  smaller meals which is not helped and continues on AcipHex daily.  Did note stopping ibuprofen but is on aspirin 325 mg because both of her children had COVID-19.  Recommended increase AcipHex to twice a day, avoid all NSAIDs, reduce aspirin to 81 mg daily or stop altogether, possible referral to endocrinology due to "shakiness", follow-up in 6 to 8 weeks.  Today she states she is doing okay overall.  GERD symptoms are currently doing well.  Not on any medication for reflux at this time.  Still with left-sided abdominal pain. Pain occurs about 2 weeks intermittently; had done better/resolved for a while. No new diet or activty changes. Unknown triggers, no alleviating factors. Has a bowel movement daily, consistent with [place] 4. Denies dysphagia. No CT study since symptoms began (last in 2020 for diarrhea). Denies N/V, hematochezia, melena, fever, chills, unintentional weight loss. Denies URI or flu-like symptoms. Denies loss of sense of taste or smell. The patient has received COVID-19 vaccination(s). Denies chest pain, dyspnea, dizziness, lightheadedness, syncope, near syncope. Denies any other upper or lower GI symptoms.  Past Medical History:  Diagnosis Date  . Allergy   . Anxiety   . Atypical squamous cell changes of undetermined significance (ASCUS) on cervical cytology with negative high risk human papilloma virus (HPV) test result 03/30/2020   6/21 repeat in 3 years ASCCP guidelines 5 year risk CIN 3+ 0.27%  . Carpal tunnel syndrome of right wrist 10/22/2018  . Contraceptive management 11/05/2013  . Depression   . GERD (gastroesophageal reflux disease)   . IUD (intrauterine  device) in place 01/13/2016  . Migraines   . Missed periods 01/13/2016  . Vaginal Pap smear, abnormal     Past Surgical History:  Procedure Laterality Date  . COLONOSCOPY N/A 05/10/2019   Procedure: COLONOSCOPY;  Surgeon: Corbin Ade, MD;  [facility];  Service: Endoscopy;  Laterality: N/A;  1:00pm  .  ESOPHAGOGASTRODUODENOSCOPY N/A 05/10/2019   Procedure: ESOPHAGOGASTRODUODENOSCOPY (EGD);  Surgeon: Corbin Ade, MD;  [facility];  Service: Endoscopy;  Laterality: N/A;  . NO PAST SURGERIES      Current Outpatient Medications  Medication Sig Dispense Refill  . cholecalciferol (VITAMIN D3) 25 MCG (1000 UT) tablet Take 1,000 Units by mouth daily.    [name] levonorgestrel (MIRENA) 20 MCG/24HR IUD 1 each by Intrauterine route once.    . nystatin-triamcinolone ointment (MYCOLOG) Apply 1 application topically 2 (two) times daily. 30 g 1  . rizatriptan (MAXALT) 10 MG tablet TAKE 1 TABLET BY MOUTH ONCE AT ONSET OF SYMPTOMS, MAY REPEAT DOSE IN 2 HOURS IF NEEDED. (Patient taking differently: Take 10 mg by mouth See admin instructions. TAKE 1 TABLET BY MOUTH ONCE AT ONSET OF SYMPTOMS, MAY REPEAT DOSE IN 2 HOURS IF NEEDED.) 9 tablet 0  . vitamin C (ASCORBIC ACID) 500 MG tablet Take 500 mg by mouth daily.    . Wheat Dextrin (BENEFIBER PO) Take 1 Scoop by mouth daily.      No current facility-administered medications for this visit.    Allergies as of 05/28/2020 - Review Complete 05/28/2020  Allergen Reaction Noted  . Penicillins Swelling 05/03/2020    Family History  Problem Relation Age of Onset  . Other Mother        enlarged heart  . Diabetes Mother   . Hypertension Mother   . Hyperlipidemia Mother   . Heart disease Mother        heart murmer  . Miscarriages / [place] Mother   . Hypertension Father   . Hyperlipidemia Father   . Cancer Father        prostate  . Other Brother        enlarged heart; colloid cyst of the third ventricle( of the brain)  . Cancer Paternal Grandmother        leukemia  . Cancer Paternal Grandfather        lung  . Hypertension Sister   . Colon cancer Maternal Grandmother   . Colon cancer Maternal Aunt   . Gastric cancer Maternal Uncle   . Esophageal cancer Maternal Uncle 63    Social History   Socioeconomic History  . Marital status:  Divorced    Spouse name: Not on file  . Number of children: 3  . Years of education: 47  . Highest education level: Not on file  Occupational History  . Occupation: Public house manager    Comment: [employer]  Tobacco Use  . Smoking status: Never Smoker  . Smokeless tobacco: Never Used  Vaping Use  . Vaping Use: Never used  Substance and Sexual Activity  . Alcohol use: Yes    Comment: occasional  . Drug use: No  . Sexual activity: Yes    Birth control/protection: I.U.D.  Other Topics Concern  . Not on file  Social History Narrative   Lives with parents   Looking for own place   Tries to walk daily   Social Determinants of Health   Financial Resource Strain: Low Risk   . Difficulty of Paying Living Expenses: Not very hard  Food Insecurity: No Food Insecurity  . Worried About Programme researcher, broadcasting/film/videounning Out of Food in the Last Year: Never true  . Ran Out of Food in the Last Year: Never true  Transportation Needs: No Transportation Needs  . Lack of Transportation (Medical): No  . Lack of Transportation (Non-Medical): No  Physical Activity: Insufficiently Active  . Days of Exercise per Week: 2 days  . Minutes of Exercise per Session: 30 min  Stress: Stress Concern Present  . Feeling of Stress : Rather much  Social Connections: Moderately Isolated  . Frequency of Communication with Friends and Family: More than three times a week  . Frequency of Social Gatherings with Friends and Family: Three times a week  . Attends Religious Services: More than 4 times per year  . Active Member of Clubs or Organizations: No  . Attends BankerClub or Organization Meetings: Never  . Marital Status: Divorced    Subjective: Review of Systems  Constitutional: Negative for chills, fever, malaise/fatigue and weight loss.  HENT: Negative for congestion and sore throat.   Respiratory: Negative for cough and shortness of breath.   Cardiovascular: Negative for chest pain and palpitations.  Gastrointestinal: Positive  for abdominal pain (Left side). Negative for blood in stool, diarrhea, melena, nausea and vomiting.  Musculoskeletal: Negative for joint pain and myalgias.  Skin: Negative for rash.  Neurological: Negative for dizziness and weakness.  Endo/Heme/Allergies: Does not bruise/bleed easily.  Psychiatric/Behavioral: Negative for depression. The patient is not nervous/anxious.   All other systems reviewed and are negative.    Objective: BP 134/89   Pulse 72   Temp (!) 97.1 F (36.2 C) (Temporal)   Ht 5\' 4"  (1.626 m)   Wt 283 lb (128.4 kg)   BMI 48.58 kg/m  Physical Exam Vitals and nursing note reviewed.  Constitutional:      General: She is not in acute distress.    Appearance: Normal appearance. She is well-developed. She is obese. She is not ill-appearing, toxic-appearing or diaphoretic.  HENT:     Head: Normocephalic and atraumatic.     Nose: No congestion or rhinorrhea.  Eyes:     General: No scleral icterus. Cardiovascular:     Rate and Rhythm: Normal rate and regular rhythm.     Heart sounds: Normal heart sounds.  Pulmonary:     Effort: Pulmonary effort is normal. No respiratory distress.     Breath sounds: Normal breath sounds.  Abdominal:     General: Bowel sounds are normal.     Palpations: Abdomen is soft. There is no hepatomegaly, splenomegaly or mass.     Tenderness: There is no abdominal tenderness. There is no guarding or rebound.     Hernia: No hernia is present.  Skin:    General: Skin is warm and dry.     Coloration: Skin is not jaundiced.     Findings: No rash.  Neurological:     General: No focal deficit present.     Mental Status: She is alert and oriented to person, place, and time.  Psychiatric:        Attention and Perception: Attention normal.        Mood and Affect: Mood normal.        Speech: Speech normal.        Behavior: Behavior normal.        Thought Content: Thought content normal.        Cognition and Memory: Cognition and memory normal.        Assessment:  Electrocoagulated right 9 urine right now pleasant 39 year old female previously seen for GERD, constipation, abdominal pain.  Abdominal pain had resolved but is now returned.  Her pain restarted 2 weeks ago, no identified triggers, no alleviating factors.  This is been ongoing in a waxing/waning fashion.  She does have significant family history of gastric cancer, esophageal cancer, colon cancer.  Recent colonoscopy is reassuring.  Previous abnormal CT as per HPI.  GERD much better.  Having regular bowel movements and no overt constipation.  We will plan for CT of the abdomen and pelvis to further evaluate her symptoms.   Plan: 1. CT of the abdomen and pelvis with contrast 2. Continue current medications 3. Let us know for any worsening symptoms 4. Follow-up in 2 months.      05/28/2020 4:21 PM   Disclaimer: This note was dictated with voice recognition software. Similar sounding words can inadvertently be transcribed and may not be corrected upon review.

## 2020-05-28 NOTE — Patient Instructions (Addendum)
Your health issues we discussed today were:   Abdominal pain: 1. We will schedule your CT of the abdomen and pelvis for you 2. Further recommendations will follow your results 3. Call us for any worsening or severe symptoms  GERD (reflux/heartburn): 1. Continue your current medications 2. Let us know if you have any recurrent symptoms  Overall I recommend:  1. Continue your other current medications 2. Let us know if you have any questions or concerns 3. Follow-up in 2 months   ---------------------------------------------------------------  I am glad you have gotten your COVID-19 vaccination!  Even though you are fully vaccinated you should continue to follow CDC and state/local guidelines.  ---------------------------------------------------------------   At [facility] we value your feedback. You may receive a survey about your visit today. Please share your experience as we strive to create trusting relationships with our patients to provide genuine, compassionate, quality care.  We appreciate your understanding and patience as we review any laboratory studies, imaging, and other diagnostic tests that are ordered as we care for you. Our office policy is 5 business days for review of these results, and any emergent or urgent results are addressed in a timely manner for your best interest. If you do not hear from our office in 1 week, please contact us.   We also encourage the use of MyChart, which contains your medical information for your review as well. If you are not enrolled in this feature, an access code is on this after visit summary for your convenience. Thank you for allowing [place] to be involved in your care.  It was great to see you today!  I hope you have a great rest of your summer!!

## 2020-05-29 ENCOUNTER — Encounter: Payer: Self-pay | Admitting: Internal Medicine

## 2020-06-04 ENCOUNTER — Telehealth: Payer: Self-pay

## 2020-06-04 NOTE — Telephone Encounter (Signed)
PA for CT abd/pelvis submitted via [employer]. Case approved. Order ID: [id number], valid 06/04/20-11/30/20.

## 2020-06-22 ENCOUNTER — Telehealth: Payer: Self-pay | Admitting: Internal Medicine

## 2020-06-22 NOTE — Telephone Encounter (Signed)
Spoke to [place] at [facility]  She wanted to clarify whether pt was having CT scan done here or if we refer to [facility].  Informed her that we refer to [facility] since we don't have capability here.  Informed her that it looks like pt cancelled her CT at [facility].  Velna Hatchet said that she was just verifying since pt requested them to refer her to [facility] to have it done.

## 2020-06-22 NOTE — Telephone Encounter (Signed)
SHEILA FROM [facility] NEEDS TO SPEAK TO SOMEONE ABOUT THIS PATIENT CT WE SENT HER FOR.  PATIENT SAID SHE WAS OUT OF NETWORK AND WANTED HER PCP TO SEND THE REFERRAL FOR THE CT TO [place], IT WAS SENT TO [place] AND THEY ARE CONFUSED ABOUT WHAT SHE NEEDS   [phone] OPTION #5 is directly to sheila

## 2020-06-23 ENCOUNTER — Ambulatory Visit (HOSPITAL_COMMUNITY): Payer: BLUE CROSS/BLUE SHIELD

## 2020-07-10 ENCOUNTER — Other Ambulatory Visit: Payer: Self-pay

## 2020-07-10 ENCOUNTER — Encounter: Payer: Self-pay | Admitting: Internal Medicine

## 2020-07-10 ENCOUNTER — Ambulatory Visit (INDEPENDENT_AMBULATORY_CARE_PROVIDER_SITE_OTHER): Payer: BLUE CROSS/BLUE SHIELD | Admitting: Internal Medicine

## 2020-07-10 ENCOUNTER — Ambulatory Visit (HOSPITAL_COMMUNITY)
Admission: RE | Admit: 2020-07-10 | Discharge: 2020-07-10 | Disposition: A | Discharge status: Home / Self Care | Payer: BLUE CROSS/BLUE SHIELD | Source: Ambulatory Visit | Attending: Internal Medicine | Admitting: Internal Medicine

## 2020-07-10 ENCOUNTER — Other Ambulatory Visit: Payer: Self-pay | Admitting: *Deleted

## 2020-07-10 VITALS — BP 127/78 | HR 77 | Temp 97.3°F | Ht 64.0 in | Wt 281.6 lb

## 2020-07-10 DIAGNOSIS — K219 Gastro-esophageal reflux disease without esophagitis: Secondary | ICD-10-CM

## 2020-07-10 DIAGNOSIS — R1012 Left upper quadrant pain: Secondary | ICD-10-CM

## 2020-07-10 DIAGNOSIS — R194 Change in bowel habit: Secondary | ICD-10-CM | POA: Insufficient documentation

## 2020-07-10 NOTE — Patient Instructions (Addendum)
Lets get a flat and upright abdominal plain film to check stool in your colon.  You may or may not need further testing in the near future  Continue Aciphex 20 mg twice  daily  Further recommendations to follow.

## 2020-07-10 NOTE — Progress Notes (Signed)
Primary Care Physician:  Erasmo Downer, NP Primary Gastroenterologist:  Dr. Jena Gauss  Pre-Procedure History & Physical: HPI:  Kristin [name] is a 39 y.o. female here for follow-up of left upper quadrant abdominal pain  - intermittent in  nature.  Not affected by eating or having a bowel movement.  States she has 1 bowel movement every day after she drinks water or coffee.  She does take Benefiber daily.  History of severe constipation previously.  Relieved with MiraLAX last year.  Recent colonoscopy /EGD results reassuring.  CT examination May of last year demonstrated a small left kidney stone.  No other significant findings.  She not had any dysphagia nausea or vomiting.  No melena or rectal bleeding.  Reflux symptoms improved with AcipHex 20 mg twice daily.  She is lost 2-1/2 pounds since she was last here however, remains significantly obese.  Past Medical History:  Diagnosis Date  . Allergy   . Anxiety   . Atypical squamous cell changes of undetermined significance (ASCUS) on cervical cytology with negative high risk human papilloma virus (HPV) test result 03/30/2020   6/21 repeat in 3 years ASCCP guidelines 5 year risk CIN 3+ 0.27%  . Carpal tunnel syndrome of right wrist 10/22/2018  . Contraceptive management 11/05/2013  . Depression   . GERD (gastroesophageal reflux disease)   . IUD (intrauterine device) in place 01/13/2016  . Migraines   . Missed periods 01/13/2016  . Vaginal Pap smear, abnormal     Past Surgical History:  Procedure Laterality Date  . COLONOSCOPY N/A 05/10/2019   Procedure: COLONOSCOPY;  Surgeon: Corbin Ade, MD;  [facility];  Service: Endoscopy;  Laterality: N/A;  1:00pm  . ESOPHAGOGASTRODUODENOSCOPY N/A 05/10/2019   Procedure: ESOPHAGOGASTRODUODENOSCOPY (EGD);  Surgeon: Corbin Ade, MD;  [facility];  Service: Endoscopy;  Laterality: N/A;  . NO PAST SURGERIES      Prior to Admission medications   Medication Sig Start  Date End Date Taking? Authorizing Provider  cholecalciferol (VITAMIN D3) 25 MCG (1000 UT) tablet Take 1,000 Units by mouth daily.   Yes [provider]  levonorgestrel (MIRENA) 20 MCG/24HR IUD 1 each by Intrauterine route once.   Yes [provider]  rizatriptan (MAXALT) 10 MG tablet TAKE 1 TABLET BY MOUTH ONCE AT ONSET OF SYMPTOMS, MAY REPEAT DOSE IN 2 HOURS IF NEEDED. Patient taking differently: Take 10 mg by mouth as needed. TAKE 1 TABLET BY MOUTH ONCE AT ONSET OF SYMPTOMS, MAY REPEAT DOSE IN 2 HOURS IF NEEDED. 01/15/18  Yes Eustace Moore, MD  vitamin C (ASCORBIC ACID) 500 MG tablet Take 500 mg by mouth daily.   Yes [provider]  Wheat Dextrin (BENEFIBER PO) Take 1 Scoop by mouth daily.    Yes [provider]  nystatin-triamcinolone ointment (MYCOLOG) Apply 1 application topically 2 (two) times daily. Patient not taking: Reported on 07/10/2020 03/25/20   Adline Potter, NP    Allergies as of 07/10/2020 - Review Complete 07/10/2020  Allergen Reaction Noted  . Penicillins Swelling 05/03/2020    Family History  Problem Relation Age of Onset  . Other Mother        enlarged heart  . Diabetes Mother   . Hypertension Mother   . Hyperlipidemia Mother   . Heart disease Mother        heart murmer  . Miscarriages / [place] Mother   . Hypertension Father   . Hyperlipidemia Father   . Cancer Father  prostate  . Other Brother        enlarged heart; colloid cyst of the third ventricle( of the brain)  . Cancer Paternal Grandmother        leukemia  . Cancer Paternal Grandfather        lung  . Hypertension Sister   . Colon cancer Maternal Grandmother   . Colon cancer Maternal Aunt   . Gastric cancer Maternal Uncle   . Esophageal cancer Maternal Uncle 64    Social History   Socioeconomic History  . Marital status: Divorced    Spouse name: Not on file  . Number of children: 3  . Years of education: 64  . Highest education level:  Not on file  Occupational History  . Occupation: Public house manager    Comment: [employer]  Tobacco Use  . Smoking status: Never Smoker  . Smokeless tobacco: Never Used  Vaping Use  . Vaping Use: Never used  Substance and Sexual Activity  . Alcohol use: Yes    Comment: occasional  . Drug use: No  . Sexual activity: Yes    Birth control/protection: I.U.D.  Other Topics Concern  . Not on file  Social History Narrative   Lives with parents   Looking for own place   Tries to walk daily   Social Determinants of Health   Financial Resource Strain: Low Risk   . Difficulty of Paying Living Expenses: Not very hard  Food Insecurity: No Food Insecurity  . Worried About Programme researcher, broadcasting/film/video in the Last Year: Never true  . Ran Out of Food in the Last Year: Never true  Transportation Needs: No Transportation Needs  . Lack of Transportation (Medical): No  . Lack of Transportation (Non-Medical): No  Physical Activity: Insufficiently Active  . Days of Exercise per Week: 2 days  . Minutes of Exercise per Session: 30 min  Stress: Stress Concern Present  . Feeling of Stress : Rather much  Social Connections: Moderately Isolated  . Frequency of Communication with Friends and Family: More than three times a week  . Frequency of Social Gatherings with Friends and Family: Three times a week  . Attends Religious Services: More than 4 times per year  . Active Member of Clubs or Organizations: No  . Attends Banker Meetings: Never  . Marital Status: Divorced  Catering manager Violence: Not At Risk  . Fear of Current or Ex-Partner: No  . Emotionally Abused: No  . Physically Abused: No  . Sexually Abused: No    Review of Systems: See HPI, otherwise negative ROS  Physical Exam: BP 127/78   Pulse 77   Temp (!) 97.3 F (36.3 C) (Temporal)   Ht 5\' 4"  (1.626 m)   Wt 281 lb 9.6 oz (127.7 kg)   BMI 48.34 kg/m  General:   Alert,  , well-nourished, pleasant and cooperative in  NAD Neck:  Supple; no masses or thyromegaly. No significant cervical adenopathy. Lungs:  Clear throughout to auscultation.   No wheezes, crackles, or rhonchi. No acute distress. Heart:  Regular rate and rhythm; no murmurs, clicks, rubs,  or gallops. Abdomen: Significantly obese.  Positive bowel sounds.  Soft.  Abdomen is nontender to palpation without appreciable mass organomegaly Pulses:  Normal pulses noted. Extremities:  Without clubbing or edema. Rectal: Rectal vault empty no mass appreciated.  Impression/Plan: 1 year history of localized left upper quadrant abdominal pain; history of constipation.  But denies now;  Does not sound radicular.  Small  nonobstructing kidney stone on the left likely -probably not an issue at this time.  I just wonder if patient is not evacuating as well as she perceives.  It would be helpful to assess stool load in her colon before making further recommendations.  Hold off on cross-sectional imaging for the time being.   Recommendations: Lets get a flat and upright abdominal plain film to check stool in your colon.  You may or may not need further testing in the near future  Continue Aciphex 20 mg twice  daily  Further recommendations to follow.      Notice: This dictation was prepared with Dragon dictation along with smaller phrase technology. Any transcriptional errors that result from this process are unintentional and may not be corrected upon review.

## 2020-07-16 ENCOUNTER — Telehealth: Payer: Self-pay | Admitting: Internal Medicine

## 2020-07-16 NOTE — Telephone Encounter (Signed)
Pt is inquiring of her results from Radiology. Pt is beginning to have some abdominal pain and said it was mentioned that she could try Linzess at her last appointment.

## 2020-07-16 NOTE — Telephone Encounter (Signed)
Pt was calling to see if her results from radiology were back. [phone]

## 2020-07-16 NOTE — Telephone Encounter (Signed)
X-rays reviewed.  Moderate amount of stool in her colon.  Could be too much for her, however.  Abdominal pain may be related.  We can try Linzess 72 once daily for 2 weeks-give samples for 2 weeks.  Do not use MiraLAX.  Call us in 2 weeks and see how things are going.  We have the potential to increase the dose if needed.  Thanks.

## 2020-07-16 NOTE — Telephone Encounter (Signed)
Spoke with pt. Pt was notified of results. Pt is going to pick samples of Linzess 72 mcg as directed. Pt advised not to take Miralax per Dr. Jena Gauss. Pt will call back if samples are working.

## 2020-07-29 NOTE — Progress Notes (Signed)
Cardiology Office Note    Date:  08/04/2020   ID:  Kristin [name], DOB [birth year], MRN [id number]  PCP:  Erasmo Downer, NP  Cardiologist: Charlton Haws, MD EPS: None  Chief Complaint  Patient presents with  . Palpitations    History of Present Illness:  Kristin [name] is a 39 y.o. female  with history of anxiety and depression who saw Dr. Eden Emms in 2018 for palpitations.  She was under a lot of stress at that time.  He check labs and ordered as needed Inderal 10 mg and she was to call if she wanted to wear a monitor.  She saw Dr. Eden Emms 10/07/2019 after an ER visit for chest pain felt to be atypical that improved as anxiety improved.  No further testing ordered.  Patient was in the ER 01/22/2020 with palpitations.  No arrhythmias noted on monitoring.  EKG had repolarization changes.  Troponin normal.  event monitor that showed normal sinus rhythm with very rare PACs and PVCs less than 1% of the time.  Patient was added onto my schedule for complaints of dizziness with slow heart rates and headache. Says she feel very anxious and has palpitations with dizziness. Having a lot of work and family stress-mother with breast CA, child support issues etc. It only lasts 2-3 min and goes away. Happens 4-5 times daily.drinks 1 cup coffee/day. Has some dyspnea on exertion she contributes to her weight. Has appt with [facility] weight loss. Hasn't tolerated anxiety meds well.  Past Medical History:  Diagnosis Date  . Allergy   . Anxiety   . Atypical squamous cell changes of undetermined significance (ASCUS) on cervical cytology with negative high risk human papilloma virus (HPV) test result 03/30/2020   6/21 repeat in 3 years ASCCP guidelines 5 year risk CIN 3+ 0.27%  . Carpal tunnel syndrome of right wrist 10/22/2018  . Contraceptive management 11/05/2013  . Depression   . GERD (gastroesophageal reflux disease)   . IUD (intrauterine device) in place 01/13/2016  . Migraines   . Missed periods  01/13/2016  . Vaginal Pap smear, abnormal     Past Surgical History:  Procedure Laterality Date  . COLONOSCOPY N/A 05/10/2019   Procedure: COLONOSCOPY;  Surgeon: Corbin Ade, MD;  [facility];  Service: Endoscopy;  Laterality: N/A;  1:00pm  . ESOPHAGOGASTRODUODENOSCOPY N/A 05/10/2019   Procedure: ESOPHAGOGASTRODUODENOSCOPY (EGD);  Surgeon: Corbin Ade, MD;  [facility];  Service: Endoscopy;  Laterality: N/A;  . NO PAST SURGERIES      Current Medications: Current Meds  Medication Sig  . cholecalciferol (VITAMIN D3) 25 MCG (1000 UT) tablet Take 1,000 Units by mouth daily.  [name] levonorgestrel (MIRENA) 20 MCG/24HR IUD 1 each by Intrauterine route once.  . linaclotide (LINZESS) 72 MCG capsule Take 72 mcg by mouth daily before breakfast.  . nystatin-triamcinolone ointment (MYCOLOG) Apply 1 application topically 2 (two) times daily.  . RABEprazole (ACIPHEX) 20 MG tablet Take 20 mg by mouth in the morning and at bedtime.  . rizatriptan (MAXALT) 10 MG tablet TAKE 1 TABLET BY MOUTH ONCE AT ONSET OF SYMPTOMS, MAY REPEAT DOSE IN 2 HOURS IF NEEDED.  [name] vitamin C (ASCORBIC ACID) 500 MG tablet Take 500 mg by mouth daily.  . Wheat Dextrin (BENEFIBER PO) Take 1 Scoop by mouth daily.      Allergies:   Amoxicillin and Penicillins   Social History   Socioeconomic History  . Marital status: Divorced    Spouse name: Not on  file  . Number of children: 3  . Years of education: 35  . Highest education level: Not on file  Occupational History  . Occupation: Public house manager    Comment: [employer]  Tobacco Use  . Smoking status: Never Smoker  . Smokeless tobacco: Never Used  Vaping Use  . Vaping Use: Never used  Substance and Sexual Activity  . Alcohol use: Yes    Comment: occasional  . Drug use: No  . Sexual activity: Yes    Birth control/protection: I.U.D.  Other Topics Concern  . Not on file  Social History Narrative   Lives with parents   Looking for own  place   Tries to walk daily   Social Determinants of Health   Financial Resource Strain: Low Risk   . Difficulty of Paying Living Expenses: Not very hard  Food Insecurity: No Food Insecurity  . Worried About Programme researcher, broadcasting/film/video in the Last Year: Never true  . Ran Out of Food in the Last Year: Never true  Transportation Needs: No Transportation Needs  . Lack of Transportation (Medical): No  . Lack of Transportation (Non-Medical): No  Physical Activity: Insufficiently Active  . Days of Exercise per Week: 2 days  . Minutes of Exercise per Session: 30 min  Stress: Stress Concern Present  . Feeling of Stress : Rather much  Social Connections: Moderately Isolated  . Frequency of Communication with Friends and Family: More than three times a week  . Frequency of Social Gatherings with Friends and Family: Three times a week  . Attends Religious Services: More than 4 times per year  . Active Member of Clubs or Organizations: No  . Attends Banker Meetings: Never  . Marital Status: Divorced     Family History:  The patient's   family history includes Cancer in her father, paternal grandfather, and paternal grandmother; Colon cancer in her maternal aunt and maternal grandmother; Diabetes in her mother; Esophageal cancer (age of onset: 78) in her maternal uncle; Gastric cancer in her maternal uncle; Heart disease in her mother; Hyperlipidemia in her father and mother; Hypertension in her father, mother, and sister; Miscarriages / [place] in her mother; Other in her brother and mother.   ROS:   Please see the history of present illness.    ROS All other systems reviewed and are negative.   PHYSICAL EXAM:   VS:  Pulse 82   Ht 5\' 4"  (1.626 m)   Wt 277 lb (125.6 kg)   SpO2 100%   BMI 47.55 kg/m   Physical Exam  GEN: Obese, in no acute distress  Neck: no JVD, carotid bruits, or masses Cardiac:RRR; no murmurs, rubs, or gallops  Respiratory:  clear to auscultation  bilaterally, normal work of breathing GI: soft, nontender, nondistended, + BS Ext: without cyanosis, clubbing, or edema, Good distal pulses bilaterally Neuro:  Alert and Oriented x 3 Psych: Tearful full affect  Wt Readings from Last 3 Encounters:  08/04/20 277 lb (125.6 kg)  07/10/20 281 lb 9.6 oz (127.7 kg)  05/28/20 283 lb (128.4 kg)      Studies/Labs Reviewed:   EKG:  EKG is ordered today.  The ekg ordered today demonstrates normal sinus rhythm, normal EKG Recent Labs: 01/22/2020: ALT 17 04/30/2020: BUN 8; Creatinine, Ser 0.67; Hemoglobin 13.1; Platelets 311; Potassium 3.5; Sodium 137   Lipid Panel    Component Value Date/Time   CHOL 158 06/23/2017 1049   TRIG 66 06/23/2017 1049   HDL 49 (  L) 06/23/2017 1049   CHOLHDL 3.2 06/23/2017 1049   LDLCALC 94 06/23/2017 1049    Additional studies/ records that were reviewed today include:  Holter monitor 03/09/2020 NSR Average HR 77 bpm PAC/PVC < 1% total beats No significant arrhythmia No significant arrhythmia noted with patient triggered event      ASSESSMENT:    1. Palpitations   2. History of chest pain   3. Anxiety      PLAN:  In order of problems listed above:  Palpitations in ER 01/02/2020 for such no arrhythmias documented in labs stable, EKG unchanged.  Here today under a lot of stress and anxiety with recurrent symptoms of palpitations.  She is not orthostatic on exam.  Anxiety medications do not seem to help much and she does not tolerate well.  We will try low-dose Inderal 10 mg 1-3 times daily as needed.  History of chest pain felt to be atypical and improved as anxiety improved  Anxiety depression per PCP  Obesity has appointment with [facility] weight loss program    Medication Adjustments/Labs and Tests Ordered: Current medicines are reviewed at length with the patient today.  Concerns regarding medicines are outlined above.  Medication changes, Labs and Tests ordered today are listed in the Patient  Instructions below. Patient Instructions  Medication Instructions:  Your physician has recommended you make the following change in your medication:   START: Propranolol 10mg  1 tablet 1-3 times daily as needed for Palpitations  *If you need a refill on your cardiac medications before your next appointment, please call your pharmacy*   Lab Work: None If you have labs (blood work) drawn today and your tests are completely normal, you will receive your results only by: MyChart Message (if you have MyChart) OR . A paper copy in the mail If you have any lab test that is abnormal or we need to change your treatment, we will call you to review the results.   Testing/Procedures: None   Follow-Up: At [facility] you and your health needs are our priority.  As part of our continuing mission to provide you with exceptional heart care, we have created designated Provider Care Teams.  These Care Teams include your primary Cardiologist (physician) and Advanced Practice Providers (APPs -  Physician Assistants and Nurse Practitioners) who all work together to provide you with the care you need, when you need it.  We recommend signing up for the patient portal called "MyChart".  Sign up information is provided on this After Visit Summary.  MyChart is used to connect with patients for Virtual Visits (Telemedicine).  Patients are able to view lab/test results, encounter notes, upcoming appointments, etc.  Non-urgent messages can be sent to your provider as well.   To learn more about what you can do with MyChart, go to [facility].    Your next appointment:   1 year(s)  The format for your next appointment:   In Person  Provider:   You may see [email or web address], MD or one of the following Advanced Practice Providers on your designated Care Team:    Charlton Haws, PA-C   Randall An, PA-C        Signed, Jacolyn Reedy, Jacolyn Reedy  08/04/2020 10:53 AM    [facility]  [facility] [address] [place], [place], [place]  [place] Phone: [phone]; Fax: [phone]

## 2020-08-04 ENCOUNTER — Encounter: Payer: Self-pay | Admitting: Physician Assistant

## 2020-08-04 ENCOUNTER — Other Ambulatory Visit: Payer: Self-pay

## 2020-08-04 ENCOUNTER — Ambulatory Visit: Payer: BLUE CROSS/BLUE SHIELD | Admitting: Physician Assistant

## 2020-08-04 VITALS — HR 82 | Ht 64.0 in | Wt 277.0 lb

## 2020-08-04 DIAGNOSIS — R002 Palpitations: Secondary | ICD-10-CM | POA: Diagnosis not present

## 2020-08-04 DIAGNOSIS — Z87898 Personal history of other specified conditions: Secondary | ICD-10-CM | POA: Diagnosis not present

## 2020-08-04 DIAGNOSIS — F419 Anxiety disorder, unspecified: Secondary | ICD-10-CM

## 2020-08-04 MED ORDER — PROPRANOLOL HCL 10 MG PO TABS: ORAL_TABLET | ORAL | 1 refills | Status: DC

## 2020-08-04 NOTE — Patient Instructions (Signed)
Medication Instructions:  Your physician has recommended you make the following change in your medication:   START: Propranolol 10mg  1 tablet 1-3 times daily as needed for Palpitations  *If you need a refill on your cardiac medications before your next appointment, please call your pharmacy*   Lab Work: None If you have labs (blood work) drawn today and your tests are completely normal, you will receive your results only by: MyChart Message (if you have MyChart) OR . A paper copy in the mail If you have any lab test that is abnormal or we need to change your treatment, we will call you to review the results.   Testing/Procedures: None   Follow-Up: At [facility], you and your health needs are our priority.  As part of our continuing mission to provide you with exceptional heart care, we have created designated Provider Care Teams.  These Care Teams include your primary Cardiologist (physician) and Advanced Practice Providers (APPs -  Physician Assistants and Nurse Practitioners) who all work together to provide you with the care you need, when you need it.  We recommend signing up for the patient portal called "MyChart".  Sign up information is provided on this After Visit Summary.  MyChart is used to connect with patients for Virtual Visits (Telemedicine).  Patients are able to view lab/test results, encounter notes, upcoming appointments, etc.  Non-urgent messages can be sent to your provider as well.   To learn more about what you can do with MyChart, go to [facility].    Your next appointment:   1 year(s)  The format for your next appointment:   In Person  Provider:   You may see [email or web address], MD or one of the following Advanced Practice Providers on your designated Care Team:    [place], PA-C   [place], Jacolyn Reedy

## 2020-08-14 NOTE — Addendum Note (Signed)
Addended by: Marlyn Corporal A on: 08/14/2020 10:30 AM   Modules accepted: Orders

## 2020-08-19 IMAGING — CT CT ANGIO HEAD
3 of 10 series · 17 of 47 positions shown · IV contrast (omnipaque)
Comparison: None.

CLINICAL DATA: Sharp headache back of head 2 weeks

EXAM:
CT ANGIOGRAPHY HEAD
TECHNIQUE: Multidetector CT imaging of the head was performed using the
standard protocol during bolus administration of intravenous
contrast. Multiplanar CT image reconstructions and MIPs were
obtained to evaluate the vascular anatomy.
CONTRAST:  80mL OMNIPAQUE IOHEXOL 350 MG/ML SOLN

[Series 11: ax thin · axial · 0.41mm/px · z∈[+14,+151]mm · 11 of 159 slices shown]
[im 11/159  brain]
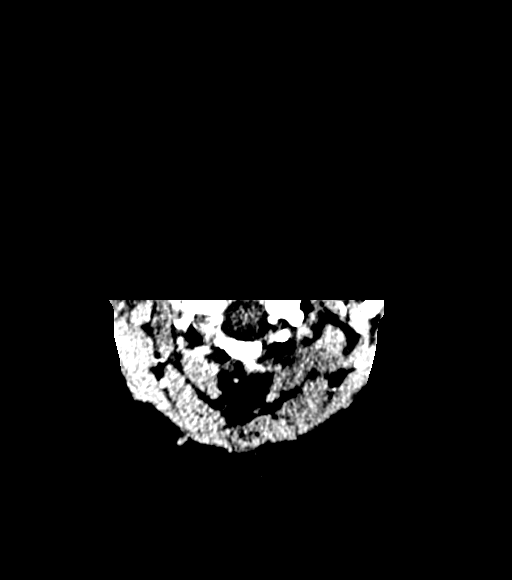
[im 22/159  bone]
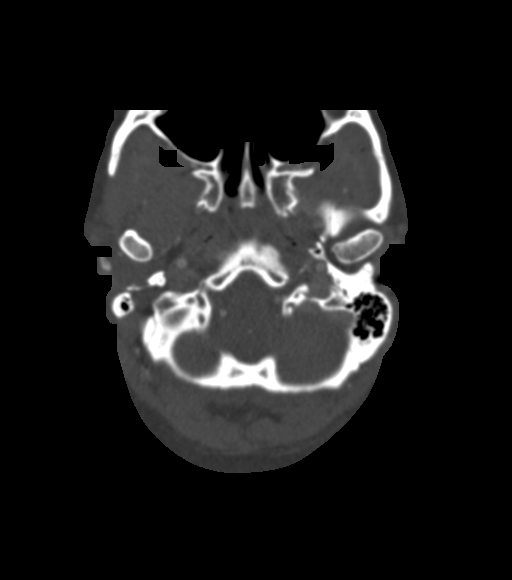
[im 43/159  brain]
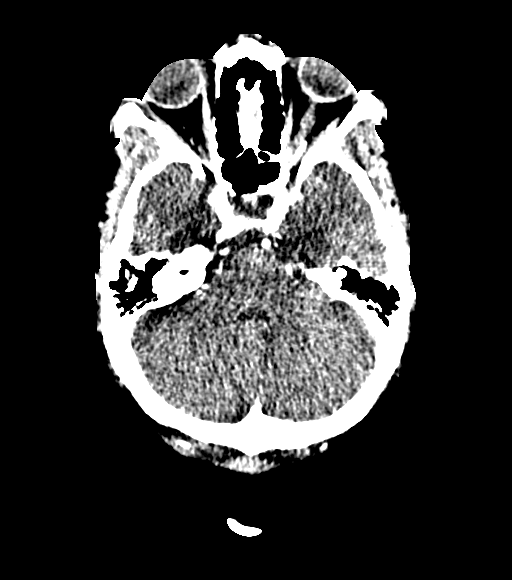
[im 53/159  bone]
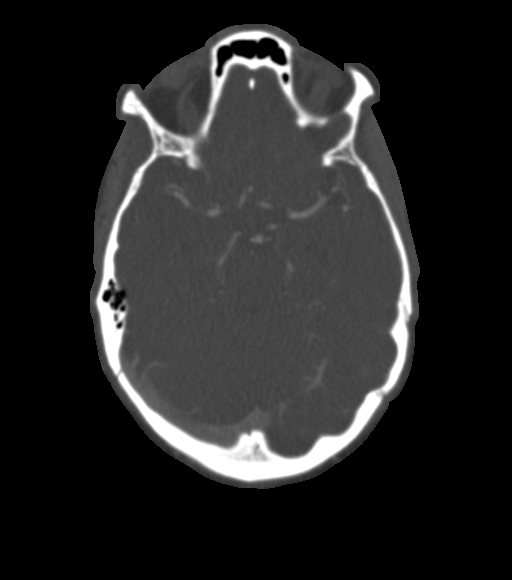
[im 64/159  brain]
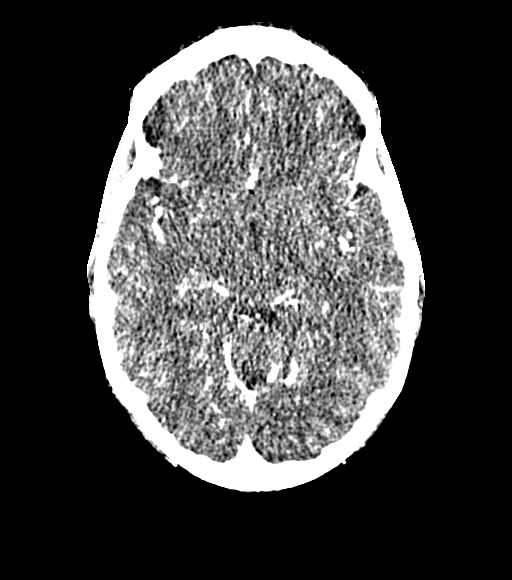
[im 85/159  bone]
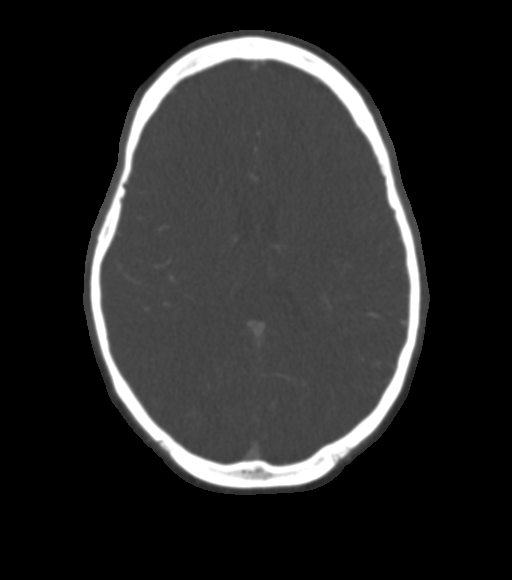
[im 95/159  brain]
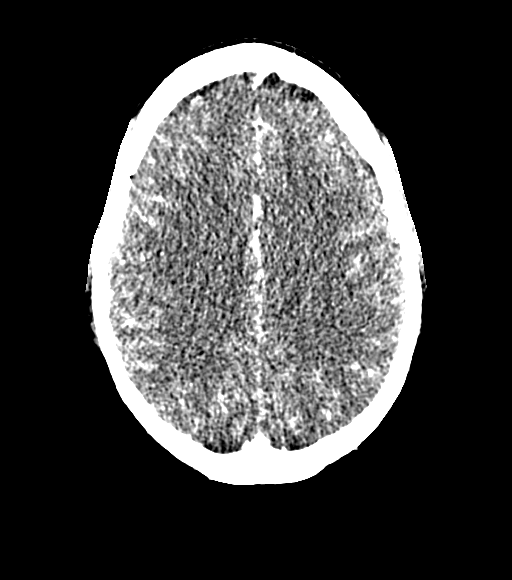
[im 106/159  bone]
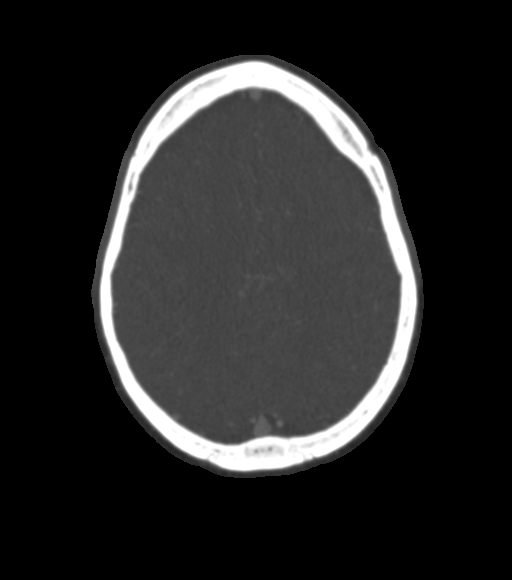
[im 116/159  brain]
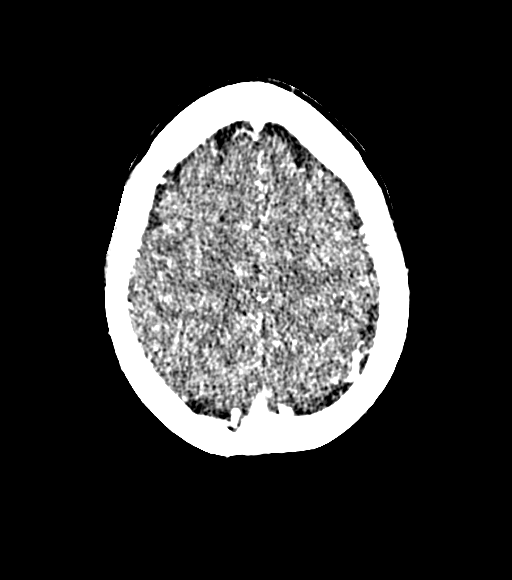
[im 137/159  bone]
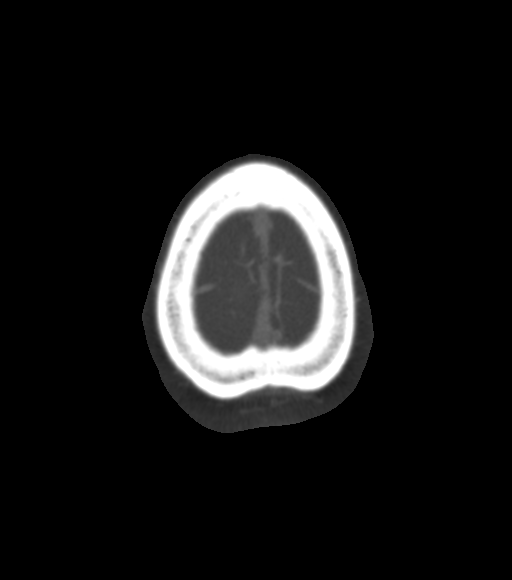
[im 148/159  brain]
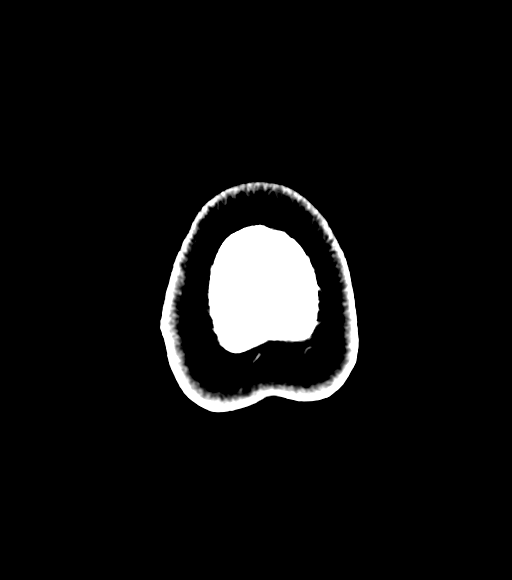

[Series 13: cor thin · coronal · 0.35mm/px · 3 of 217 slices shown]
[im 73/217  brain]
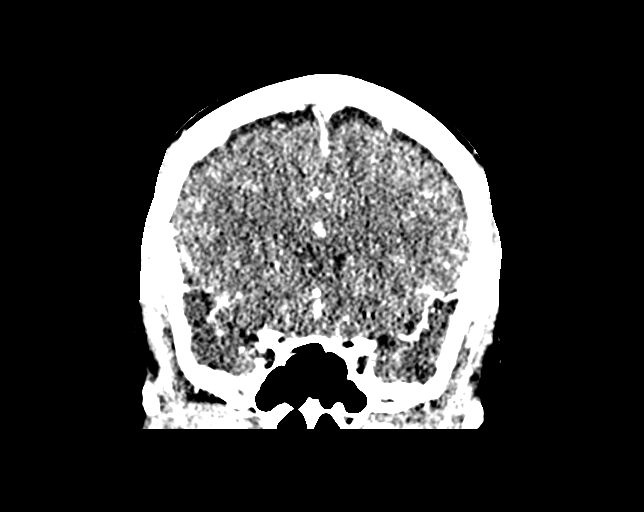
[im 109/217  brain]
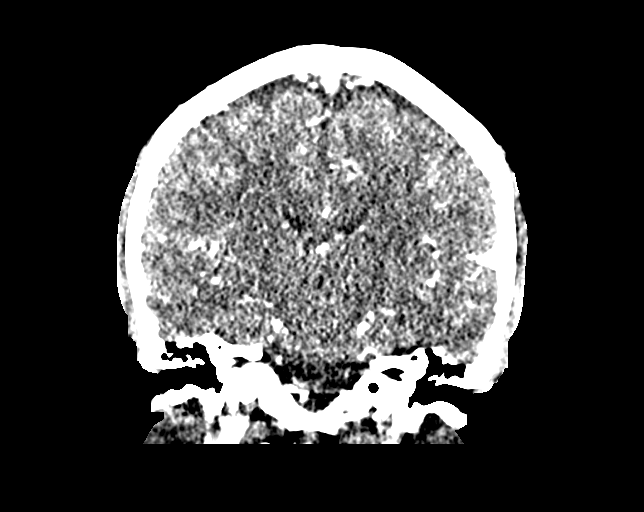
[im 145/217  brain]
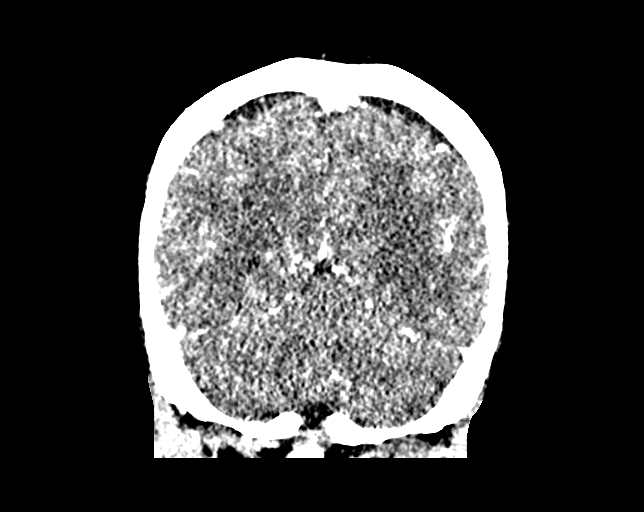

[Series 15: sag thin · sagittal · 0.36mm/px · 3 of 201 slices shown]
[im 51/201  brain]
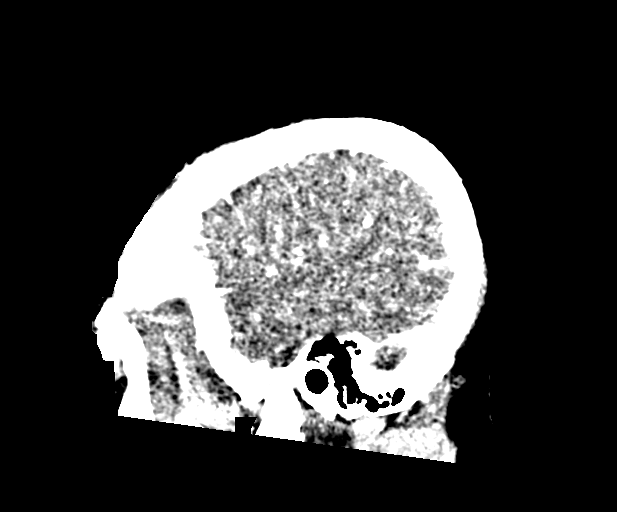
[im 101/201  brain]
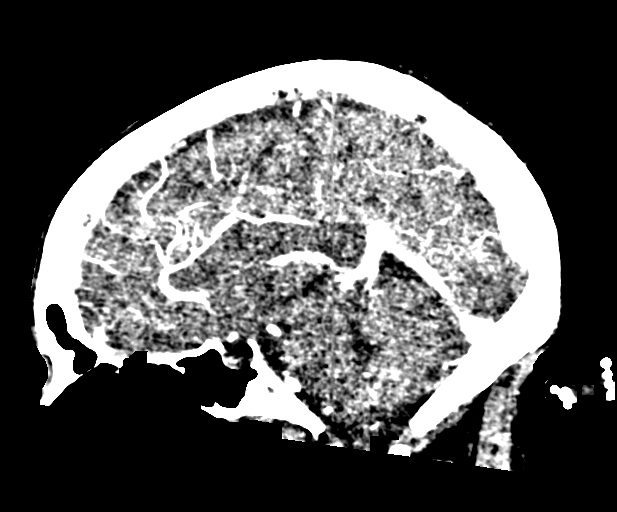
[im 151/201  brain]
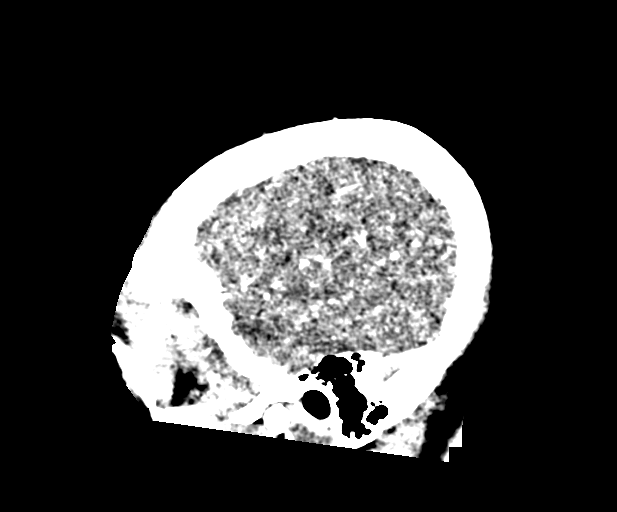

[17 of 47 positions shown; findings below may reference images not displayed]

FINDINGS: CT HEAD

Brain: No evidence of acute infarction, hemorrhage, hydrocephalus,
extra-axial collection or mass lesion/mass effect.

Vascular: Negative for hyperdense vessel

Skull: Negative

Sinuses: Negative

Orbits: Negative

CTA HEAD

Anterior circulation: Cavernous carotid normal bilaterally. Anterior
and middle cerebral arteries widely patent. Negative for vascular
malformation

Posterior circulation: Both vertebral arteries widely patent to the
basilar. PICA patent bilaterally. Basilar widely patent. Superior
cerebellar and posterior cerebral arteries patent bilaterally.
Negative for vascular malformation.

Venous sinuses: Normal venous enhancement.

Anatomic variants: None
IMPRESSION: Negative CT head

Negative CTA head. No intracranial stenosis or vascular
malformation.

## 2020-09-14 NOTE — Progress Notes (Deleted)
Cardiology Office Note    Date:  09/14/2020   ID:  Kristin [name], DOB 1981/09/24, MRN [id number]  PCP:  Erasmo Downer, NP  Cardiologist: Charlton Haws, MD EPS: None  No chief complaint on file.   History of Present Illness:  Kristin [name] is a 39 y.o. female  with history of anxiety and depression first seen in 2018 She was under a lot of stress at that time.  Labs and TSH normal Has had atypical chest pain and ER visit April for anxiety and palpitations With negative w/u and no arrhythmias on telemetry   Event monitor that showed normal sinus rhythm with very rare PACs and PVCs less than 1% of the time.  Having a lot of work and family stress-mother with breast CA, child support issues etc. It only lasts 2-3 min and goes away. Happens 4-5 times daily.drinks 1 cup coffee/day. Has some dyspnea on exertion she contributes to her weight. Has appt with [facility] weight loss. Hasn't tolerated anxiety meds well.  ***  Past Medical History:  Diagnosis Date  . Allergy   . Anxiety   . Atypical squamous cell changes of undetermined significance (ASCUS) on cervical cytology with negative high risk human papilloma virus (HPV) test result 03/30/2020   6/21 repeat in 3 years ASCCP guidelines 5 year risk CIN 3+ 0.27%  . Carpal tunnel syndrome of right wrist 10/22/2018  . Contraceptive management 11/05/2013  . Depression   . GERD (gastroesophageal reflux disease)   . IUD (intrauterine device) in place 01/13/2016  . Migraines   . Missed periods 01/13/2016  . Vaginal Pap smear, abnormal     Past Surgical History:  Procedure Laterality Date  . COLONOSCOPY N/A 05/10/2019   Procedure: COLONOSCOPY;  Surgeon: Corbin Ade, MD;  Location: [facility];  Service: Endoscopy;  Laterality: N/A;  1:00pm  . ESOPHAGOGASTRODUODENOSCOPY N/A 05/10/2019   Procedure: ESOPHAGOGASTRODUODENOSCOPY (EGD);  Surgeon: Corbin Ade, MD;  Location: [facility];  Service: Endoscopy;  Laterality: N/A;  . NO PAST  SURGERIES      Current Medications: No outpatient medications have been marked as taking for the 09/21/20 encounter (Appointment) with Wendall Stade, MD.     Allergies:   Amoxicillin and Penicillins   Social History   Socioeconomic History  . Marital status: Divorced    Spouse name: Not on file  . Number of children: 3  . Years of education: 27  . Highest education level: Not on file  Occupational History  . Occupation: Public house manager    Comment: [employer]  Tobacco Use  . Smoking status: Never Smoker  . Smokeless tobacco: Never Used  Vaping Use  . Vaping Use: Never used  Substance and Sexual Activity  . Alcohol use: Yes    Comment: occasional  . Drug use: No  . Sexual activity: Yes    Birth control/protection: I.U.D.  Other Topics Concern  . Not on file  Social History Narrative   Lives with parents   Looking for own place   Tries to walk daily   Social Determinants of Health   Financial Resource Strain: Low Risk   . Difficulty of Paying Living Expenses: Not very hard  Food Insecurity: No Food Insecurity  . Worried About Programme researcher, broadcasting/film/video in the Last Year: Never true  . Ran Out of Food in the Last Year: Never true  Transportation Needs: No Transportation Needs  . Lack of Transportation (Medical): No  . Lack of Transportation (Non-Medical):  No  Physical Activity: Insufficiently Active  . Days of Exercise per Week: 2 days  . Minutes of Exercise per Session: 30 min  Stress: Stress Concern Present  . Feeling of Stress : Rather much  Social Connections: Moderately Isolated  . Frequency of Communication with Friends and Family: More than three times a week  . Frequency of Social Gatherings with Friends and Family: Three times a week  . Attends Religious Services: More than 4 times per year  . Active Member of Clubs or Organizations: No  . Attends Banker Meetings: Never  . Marital Status: Divorced     Family History:  The patient's    family history includes Cancer in her father, paternal grandfather, and paternal grandmother; Colon cancer in her maternal aunt and maternal grandmother; Diabetes in her mother; Esophageal cancer (age of onset: 37) in her maternal uncle; Gastric cancer in her maternal uncle; Heart disease in her mother; Hyperlipidemia in her father and mother; Hypertension in her father, mother, and sister; Miscarriages / [place] in her mother; Other in her brother and mother.   ROS:   Please see the history of present illness.    ROS All other systems reviewed and are negative.   PHYSICAL EXAM:   VS:  There were no vitals taken for this visit.  Affect appropriate Healthy:  appears stated age HEENT: normal Neck supple with no adenopathy JVP normal no bruits no thyromegaly Lungs clear with no wheezing and good diaphragmatic motion Heart:  S1/S2 no murmur, no rub, gallop or click PMI normal Abdomen: benighn, BS positve, no tenderness, no AAA no bruit.  No HSM or HJR Distal pulses intact with no bruits No edema Neuro non-focal Skin warm and dry No muscular weakness   Wt Readings from Last 3 Encounters:  08/04/20 125.6 kg  07/10/20 127.7 kg  05/28/20 128.4 kg      Studies/Labs Reviewed:   EKG:  EKG is ordered today.  The ekg ordered today demonstrates normal sinus rhythm, normal EKG Recent Labs: 01/22/2020: ALT 17 04/30/2020: BUN 8; Creatinine, Ser 0.67; Hemoglobin 13.1; Platelets 311; Potassium 3.5; Sodium 137   Lipid Panel    Component Value Date/Time   CHOL 158 06/23/2017 1049   TRIG 66 06/23/2017 1049   HDL 49 (L) 06/23/2017 1049   CHOLHDL 3.2 06/23/2017 1049   LDLCALC 94 06/23/2017 1049    Additional studies/ records that were reviewed today include:  Holter monitor 03/09/2020 NSR Average HR 77 bpm PAC/PVC < 1% total beats No significant arrhythmia No significant arrhythmia noted with patient triggered event      ASSESSMENT:    No diagnosis found.   PLAN:  In order  of problems listed above:  Palpitations - no arrhythmias seen in ER 01/02/20 started on low dose inderal by PA 08/04/20 monitor 02/13/20  With average HR 77 PAC/PVC < 1% total beats no significant arrhythmia ***  History of chest pain felt to be atypical and improved as anxiety improved  Anxiety depression per PCP  Obesity has appointment with [facility] weight loss program    Medication Adjustments/Labs and Tests Ordered: Current medicines are reviewed at length with the patient today.  Concerns regarding medicines are outlined above.  Medication changes, Labs and Tests ordered today are listed in the Patient Instructions below. There are no Patient Instructions on file for this visit.   Signed, Charlton Haws, MD  09/14/2020 2:44 PM    [facility] [address] [place], [place], [place]  [address] Phone: [phone]; Fax: [phone]

## 2020-09-21 ENCOUNTER — Ambulatory Visit: Payer: BLUE CROSS/BLUE SHIELD | Admitting: Cardiovascular Disease

## 2020-09-23 ENCOUNTER — Telehealth: Payer: Self-pay

## 2020-09-23 NOTE — Telephone Encounter (Signed)
Spoke with pt. Pt asked for more Linzess 72 mcg samples. Samples are ready for pickup and pt will pickup today 09/23/20.

## 2020-10-14 DIAGNOSIS — M9903 Segmental and somatic dysfunction of lumbar region: Secondary | ICD-10-CM | POA: Diagnosis not present

## 2020-10-14 DIAGNOSIS — M9907 Segmental and somatic dysfunction of upper extremity: Secondary | ICD-10-CM | POA: Diagnosis not present

## 2020-10-14 DIAGNOSIS — M9901 Segmental and somatic dysfunction of cervical region: Secondary | ICD-10-CM | POA: Diagnosis not present

## 2020-10-14 DIAGNOSIS — M9902 Segmental and somatic dysfunction of thoracic region: Secondary | ICD-10-CM | POA: Diagnosis not present

## 2020-10-15 DIAGNOSIS — M9901 Segmental and somatic dysfunction of cervical region: Secondary | ICD-10-CM | POA: Diagnosis not present

## 2020-10-15 DIAGNOSIS — M9902 Segmental and somatic dysfunction of thoracic region: Secondary | ICD-10-CM | POA: Diagnosis not present

## 2020-10-15 DIAGNOSIS — M9907 Segmental and somatic dysfunction of upper extremity: Secondary | ICD-10-CM | POA: Diagnosis not present

## 2020-10-15 DIAGNOSIS — M9903 Segmental and somatic dysfunction of lumbar region: Secondary | ICD-10-CM | POA: Diagnosis not present

## 2020-10-17 ENCOUNTER — Other Ambulatory Visit: Payer: Self-pay | Admitting: Adult Health

## 2020-11-10 ENCOUNTER — Ambulatory Visit: Payer: BLUE CROSS/BLUE SHIELD | Admitting: Gastroenterology

## 2020-11-10 ENCOUNTER — Encounter: Payer: Self-pay | Admitting: Gastroenterology

## 2020-11-10 ENCOUNTER — Other Ambulatory Visit: Payer: Self-pay

## 2020-11-10 VITALS — BP 127/83 | HR 79 | Temp 96.9°F | Ht 64.0 in | Wt 279.8 lb

## 2020-11-10 DIAGNOSIS — R194 Change in bowel habit: Secondary | ICD-10-CM | POA: Diagnosis not present

## 2020-11-10 DIAGNOSIS — R1084 Generalized abdominal pain: Secondary | ICD-10-CM | POA: Diagnosis not present

## 2020-11-10 DIAGNOSIS — K219 Gastro-esophageal reflux disease without esophagitis: Secondary | ICD-10-CM

## 2020-11-10 DIAGNOSIS — R1012 Left upper quadrant pain: Secondary | ICD-10-CM | POA: Diagnosis not present

## 2020-11-10 DIAGNOSIS — R1032 Left lower quadrant pain: Secondary | ICD-10-CM

## 2020-11-10 DIAGNOSIS — Z131 Encounter for screening for diabetes mellitus: Secondary | ICD-10-CM | POA: Diagnosis not present

## 2020-11-10 MED ORDER — LINACLOTIDE 72 MCG PO CAPS: 72.0000 ug | ORAL_CAPSULE | Freq: Every day | ORAL | 11 refills | Status: DC

## 2020-11-10 MED ORDER — PANTOPRAZOLE SODIUM 40 MG PO TBEC
40.0000 mg | DELAYED_RELEASE_TABLET | Freq: Two times a day (BID) | ORAL | 5 refills | Status: DC
Start: 2020-11-10 — End: 2021-04-19

## 2020-11-10 NOTE — Progress Notes (Signed)
Primary Care Physician: Erasmo Downer, NP  Primary Gastroenterologist:  Roetta Sessions, MD   Chief Complaint  Patient presents with  . Abdominal Pain    Started Christmas; left abd  . Nausea    HPI: Kristin [name] is a 40 y.o. female here for further evaluation of left sided abdominal pain. Patient was last seen in October 2021.  Patient was seen at that time for left upper quadrant pain intermittent in nature.  She has had left-sided abdominal pain off and on for nearly 2 years. Extensive evaluation to date without clear etiology. See below for details. Overall her symptoms are very similar now as they were before when she had work-up. Specifically noting abdominal pain after breakfast on Christmas Day. That pain was in the epigastric region, severe at the time but has improved. She has persistent griping type pain throughout the abdomen like she needs to have a bowel movement but she does not have 1. A lot of noises and rumbling. Slight change in bowel habits. She was having a bowel movement twice daily but now only once daily and feels less productive. Stools remain soft however. No blood in the stool or melena. New symptom of nausea. Also with refractory heartburn. Wakes up in the middle the night with pain in the chest/burning/nausea but no vomiting. Uses over-the-counter antacids with relief. Currently taking AcipHex twice daily. Previously tried/failed omeprazole. Was on Nexium at one point which seemed to help.  Abdominal pain over the past couple of months has consisted of left upper to left mid abdominal pain. Sometimes worse with meals. She has not figured out any particular trigger foods. If she lays on her right side in bed and her left arm is resting on her left abdomen she has pain. Able to sleep on her left side without difficulty. Last year when she was on Linzess samples, her left-sided abdominal pain was better. Since Christmas she has had left lower quadrant pain.  Nagging quality. Not sure what makes it better or worse. Trying to avoid spicy foods due to her refractory reflux. Denies dysuria or unintentional weight loss.   Prior work-up:  Abdominal f x-ray October 2021 with average stool burden, no acute findings.  Added Linzess 72 mcg daily for 2 weeks.  CT May 2020 demonstrated small left kidney stone, enlarged left ovarian vein felt to be nonspecific but could be seen in patients with pelvic congestion syndrome, but no other significant findings.  Pelvic ultrasound October 2020 for left lower quadrant pain showed clinically insignificant myoma, IUD in proper location, both ovaries normal.  EGD and colonoscopy in August 2020 showed hiatal hernia but otherwise unremarkable.    Current Outpatient Medications  Medication Sig Dispense Refill  . cholecalciferol (VITAMIN D3) 25 MCG (1000 UT) tablet Take 1,000 Units by mouth daily.    [name] levonorgestrel (MIRENA) 20 MCG/24HR IUD 1 each by Intrauterine route once.    . nystatin-triamcinolone ointment (MYCOLOG) APPLY TO AFFECTED AREA TWICE DAILY 30 g 11  . RABEprazole (ACIPHEX) 20 MG tablet Take 20 mg by mouth in the morning and at bedtime.    . rizatriptan (MAXALT) 10 MG tablet TAKE 1 TABLET BY MOUTH ONCE AT ONSET OF SYMPTOMS, MAY REPEAT DOSE IN 2 HOURS IF NEEDED. 9 tablet 0  . vitamin C (ASCORBIC ACID) 500 MG tablet Take 500 mg by mouth daily.    . Wheat Dextrin (BENEFIBER PO) Take 1 Scoop by mouth daily.      No current  facility-administered medications for this visit.    Allergies as of 11/10/2020 - Review Complete 11/10/2020  Allergen Reaction Noted  . Amoxicillin Swelling 08/04/2020  . Penicillins Swelling 05/03/2020    ROS:  General: Negative for anorexia, weight loss, fever, chills, fatigue, weakness. ENT: Negative for hoarseness, difficulty swallowing , nasal congestion. CV: Negative for chest pain, angina, palpitations, dyspnea on exertion, peripheral edema.  Respiratory: Negative for  dyspnea at rest, dyspnea on exertion, cough, sputum, wheezing.  GI: See history of present illness. GU:  Negative for dysuria, hematuria, urinary incontinence, urinary frequency, nocturnal urination.  Endo: Negative for unusual weight change.    Physical Examination:   BP 127/83   Pulse 79   Temp (!) 96.9 F (36.1 C) (Temporal)   Ht 5\' 4"  (1.626 m)   Wt 279 lb 12.8 oz (126.9 kg)   BMI 48.03 kg/m   General: Well-nourished, well-developed in no acute distress.  Eyes: No icterus. Mouth: masked.  Abdomen: Bowel sounds are normal, nontender, nondistended, no hepatosplenomegaly or masses, no abdominal bruits or hernia , no rebound or guarding.   Extremities: No lower extremity edema. No clubbing or deformities. Neuro: Alert and oriented x 4   Skin: Warm and dry, no jaundice.   Psych: Alert and cooperative, normal mood and affect.  Labs:  Lab Results  Component Value Date   CREATININE 0.67 04/30/2020   BUN 8 04/30/2020   NA 137 04/30/2020   K 3.5 04/30/2020   CL 102 04/30/2020   CO2 27 04/30/2020   Lab Results  Component Value Date   WBC 6.8 04/30/2020   HGB 13.1 04/30/2020   HCT 42.3 04/30/2020   MCV 86.5 04/30/2020   PLT 311 04/30/2020   Lab Results  Component Value Date   ALT 17 01/22/2020   AST 13 (L) 01/22/2020   ALKPHOS 102 01/22/2020   BILITOT 0.7 01/22/2020      Imaging Studies: No results found.  Assessment/plan:  Pleasant 40 year old female with morbid obesity, GERD, chronic abdominal pain presenting for follow-up.  Abdominal pain: Left-sided abdominal pain intermittent for nearly 2 years. Extensive evaluation as outlined above. Has noted improvement on Linzess with increased frequency of stools. Suspect element of functional abdominal pain/IBS. We discussed trial of Linzess again to see how she does as far as her abdominal pain. If persistent abdominal pain, would consider repeat imaging. Previously with small left kidney stone likely not contributing to  her symptoms. Also with possible pelvic congestion syndrome on CT in May 2020. Has been followed by GYN. Will update labs. Progress for a couple of weeks.  GERD: Refractory symptoms. Weight likely contributing. Reinforced antireflux measures. We will switch from AcipHex to pantoprazole 40 mg once to twice daily. EGD completed in 2020 as outlined above.  Further recommendations to follow pending labs and response to Linzess/pantoprazole.

## 2020-11-10 NOTE — Patient Instructions (Addendum)
1. Stop rabeprazole (Aciphex). 2. Start pantoprazole 40 mg twice daily before a meal. Prescription provided. 3. Start Linzess 72 mcg daily. Samples and prescription provided. 4. Please have labs done. If you have these done with your PCP, make sure they send [place] a copy. If you have not heard from [place] with results, please call and check that we received them. 5. Call in a couple of weeks and let us know how you are feeling with the changes in medication. 6. Consider keeping a diary regarding her abdominal pain to see if you can figure out any trigger foods.

## 2020-11-19 NOTE — Telephone Encounter (Signed)
Received PA request for Linzess 72 mcg from pts pharmacy [facility]. PA was submitted on [email or web address]. waiting on an approval or denial.

## 2020-11-20 NOTE — Telephone Encounter (Signed)
Received denial letter from [facility] [place] for Linzess 72 mcg. [employer] is stating pt needs to try and fail Trulance and Lubiprostone(Amitiza). A PA still may be required for those medications.   Spoke with pt. Pt is going to try a Linzess pt savings card to see if it will bring the cost of the medication down. Samples of Linzess 72 mcg are ready for pickup with rebate card. Pt will call back if medication needs to be changed due to insurance not covering the medication.

## 2020-11-23 NOTE — Telephone Encounter (Signed)
VM received this morning 11/23/20. Message was left on Friday 11/20/20 and transferred to VM. Pt was outside waiting to pickup samples and the doors to the office were locked. Called and left a detailed message for pt. Offered my sincere apologies for the office doors being closed. Our office closes early on Fridays and I didn't receive pts phone call. Samples are ready for pickup.

## 2020-11-30 ENCOUNTER — Telehealth: Payer: Self-pay | Admitting: Gastroenterology

## 2020-11-30 DIAGNOSIS — R7982 Elevated C-reactive protein (CRP): Secondary | ICD-10-CM

## 2020-11-30 DIAGNOSIS — R1012 Left upper quadrant pain: Secondary | ICD-10-CM

## 2020-11-30 NOTE — Telephone Encounter (Signed)
Please let patient know that I reviewed labs dated 11/10/2020:  Glucose 79, BUN 8, creatinine 0.71, sodium 138, potassium 4.1, albumin 4, total bilirubin 0.5, alkaline phosphatase 115, AST 13, ALT 12, white blood cell count 7200, hemoglobin 13, hematocrit 40.2, platelets 2 93,000, sed rate 6, A1c 5.2, lipase 19, amylase 57, TSH 2.6, CRP elevated at 16.5   Her CRP could be elevated from inflammation or infection.  Is her abdominal pain improved on Linzess for constipation? If she is feeling better we will simply repeat CRP in few weeks, if not feeling better, we will continue work up.

## 2020-12-01 NOTE — Telephone Encounter (Signed)
Lmom, waiting on a return call.  

## 2020-12-02 ENCOUNTER — Telehealth: Payer: Self-pay | Admitting: Internal Medicine

## 2020-12-02 NOTE — Telephone Encounter (Signed)
Call has been addressed. See other phone note.  

## 2020-12-02 NOTE — Telephone Encounter (Signed)
Spoke with pt. Pt was notified of results. Pt picked samples up of Linzess last week and was given a copay card. Pts cost of Linzess is still over $200.00. Pt is going to call her insurance to see if they will cover another medication. Offered pt assistance and pt doesn't want to do this until she speaks with her insurance company. Pt is having a BM but still having pain/discomfort in her rt side abdomen going up to her chest.

## 2020-12-02 NOTE — Telephone Encounter (Signed)
Pt returning call. [phone]

## 2020-12-03 NOTE — Telephone Encounter (Signed)
Pt called back and would like to know the normal ranges for a CRP level. Pt isn't sure if there is anything she can do better to bring the levels back to normal. Pt says she has felt off for a while now. Pt says her doctors haven't been able to find out what's wrong with her and why she feels tired.

## 2020-12-07 NOTE — Telephone Encounter (Signed)
Patient confirms that her pain is all left sided abdominal pain. Radiates into chest. BMs better but pain persisting. Complains of fatigue, does not feel well. Seeing multiple specialist, ENT, Neuro, cardiology, bariatric. Has also been having aching in left side of her neck. Borderline enlarged lymph node.   CRP elevated but unclear etiology.   Recommend CT A/P with contrast for LUQ abdominal pain, elevated CRP, rule out IBD, malignancy.   If CT negative, then next step would be tertiary GI referral to center of choice.

## 2020-12-07 NOTE — Addendum Note (Signed)
Addended by: Corrie Mckusick on: 12/07/2020 02:53 PM   Modules accepted: Orders

## 2020-12-07 NOTE — Telephone Encounter (Signed)
Started PA for CT abd/pelvis via [employer]. [facility] is out of network. Explanation provided for CT and medical necessity. Wasn't given option to submit case. Status showing "incomplete" when attempt to pull case up. Unable to access case to doing any further.

## 2020-12-07 NOTE — Telephone Encounter (Signed)
CT abd/pelvis w/contrast scheduled for 12/18/20 at 5:00pm, arrive at 4:45pm. NPO 4 hours prior to test. Pickup contrast before day of test.  Called and informed pt of CT appt. Letter mailed.

## 2020-12-07 NOTE — Telephone Encounter (Signed)
Noted  

## 2020-12-08 ENCOUNTER — Telehealth: Payer: Self-pay | Admitting: *Deleted

## 2020-12-08 NOTE — Telephone Encounter (Signed)
Pt called and wanted to see if she can get Linzess samples.  She said that Helmut Muster has been working with her.  She wants to follow up and see if there was something cheaper for her to get in a RX.  Routing to [employer], CMA.

## 2020-12-08 NOTE — Telephone Encounter (Signed)
Left a detailed message for pt. Linzess 72 mcg samples are ready for pick up. Waiting on a return call to discuss further with pt. Offered pt assistance previously.

## 2020-12-11 DIAGNOSIS — R42 Dizziness and giddiness: Secondary | ICD-10-CM | POA: Diagnosis not present

## 2020-12-11 DIAGNOSIS — R200 Anesthesia of skin: Secondary | ICD-10-CM | POA: Diagnosis not present

## 2020-12-11 DIAGNOSIS — M25562 Pain in left knee: Secondary | ICD-10-CM | POA: Diagnosis not present

## 2020-12-11 DIAGNOSIS — G43719 Chronic migraine without aura, intractable, without status migrainosus: Secondary | ICD-10-CM | POA: Diagnosis not present

## 2020-12-11 DIAGNOSIS — Z6841 Body Mass Index (BMI) 40.0 and over, adult: Secondary | ICD-10-CM | POA: Diagnosis not present

## 2020-12-11 DIAGNOSIS — G44221 Chronic tension-type headache, intractable: Secondary | ICD-10-CM | POA: Diagnosis not present

## 2020-12-11 DIAGNOSIS — R202 Paresthesia of skin: Secondary | ICD-10-CM | POA: Diagnosis not present

## 2020-12-14 ENCOUNTER — Telehealth: Payer: Self-pay

## 2020-12-14 NOTE — Telephone Encounter (Signed)
Unable to complete PA for CT abd/pelvis w/contrast online d/t [facility] is out of network.  [employer], spoke to [employer], she advised [facility] handles PA for imaging. Call ref# [id number]. [facility] [phone]).  Call was transferred to [facility], spoke to [place], [facility] is out of network. Network review required for case. Ref# [id number].

## 2020-12-16 DIAGNOSIS — M542 Cervicalgia: Secondary | ICD-10-CM | POA: Diagnosis not present

## 2020-12-16 DIAGNOSIS — Z6841 Body Mass Index (BMI) 40.0 and over, adult: Secondary | ICD-10-CM | POA: Diagnosis not present

## 2020-12-16 DIAGNOSIS — H8110 Benign paroxysmal vertigo, unspecified ear: Secondary | ICD-10-CM | POA: Diagnosis not present

## 2020-12-16 DIAGNOSIS — M25519 Pain in unspecified shoulder: Secondary | ICD-10-CM | POA: Diagnosis not present

## 2020-12-16 NOTE — Telephone Encounter (Signed)
Received fax from [facility], CT denied at [facility] payment. [facility] is an out of network provider. Per fax: please note, however, we have approved your 74176/CT scan of abdomen and pelvis without contrast at an out-of-network benefit level. PA# [id number], valid 12/14/20-06/11/21.   Pt is having CT abd/pelvis with contrast. CPT code 93716. Called [facility] [phone]), spoke to [facility], informed her CPT code should be 74177. She advised CPT code 51025 is also covered under the authorization.

## 2020-12-18 ENCOUNTER — Ambulatory Visit (HOSPITAL_COMMUNITY): Payer: BLUE CROSS/BLUE SHIELD

## 2020-12-27 ENCOUNTER — Encounter: Payer: Self-pay | Admitting: Emergency Medicine

## 2020-12-27 ENCOUNTER — Ambulatory Visit
Admission: EM | Admit: 2020-12-27 | Discharge: 2020-12-27 | Disposition: A | Discharge status: Home / Self Care | Payer: BLUE CROSS/BLUE SHIELD | Attending: Physician Assistant | Admitting: Physician Assistant

## 2020-12-27 ENCOUNTER — Other Ambulatory Visit: Payer: Self-pay

## 2020-12-27 DIAGNOSIS — R059 Cough, unspecified: Secondary | ICD-10-CM | POA: Diagnosis not present

## 2020-12-27 DIAGNOSIS — J069 Acute upper respiratory infection, unspecified: Secondary | ICD-10-CM

## 2020-12-27 NOTE — Discharge Instructions (Addendum)
Continue with Zyrtec and Flonase Continue with mucinex with a decongestant COVID test pending If positive self isolate 5 days from symptom onset and then wear a mask around others for 5 days.  Can taken tylenol/ibuprofen as needed for pain Push fluids

## 2020-12-27 NOTE — ED Provider Notes (Signed)
[facility]    CSN: [id number] Arrival date & time: 12/27/20  1223      History   Chief Complaint Chief Complaint  Patient presents with  . Sinus Problem    HPI Kristin [name] is a 40 y.o. female.   Pt complains of congestion and sinus pressure that started 4 days ago.  Denies fever, chills, cough, shortness of breath, n/v/d.  She has been taking zyrtec, flonase, mucinex, and ibuprofen with temporary relief.  Pt has completed covid vaccination.  She denies sick contacts.      Past Medical History:  Diagnosis Date  . Allergy   . Anxiety   . Atypical squamous cell changes of undetermined significance (ASCUS) on cervical cytology with negative high risk human papilloma virus (HPV) test result 03/30/2020   6/21 repeat in 3 years ASCCP guidelines 5 year risk CIN 3+ 0.27%  . Carpal tunnel syndrome of right wrist 10/22/2018  . Contraceptive management 11/05/2013  . Depression   . GERD (gastroesophageal reflux disease)   . IUD (intrauterine device) in place 01/13/2016  . Migraines   . Missed periods 01/13/2016  . Vaginal Pap smear, abnormal     Patient Active Problem List   Diagnosis Date Noted  . LLQ pain 11/10/2020  . LUQ pain 11/10/2020  . Atypical squamous cell changes of undetermined significance (ASCUS) on cervical cytology with negative high risk human papilloma virus (HPV) test result 03/30/2020  . Vulvar itching 03/25/2020  . Abdominal pain 01/21/2020  . Shakiness 01/21/2020  . Left flank pain 11/13/2019  . Bloating 11/13/2019  . Urinary frequency 11/13/2019  . Loss of weight 06/12/2019  . Boil of groin 05/03/2019  . Lower abdominal pain 04/19/2019  . Pain with urination 04/19/2019  . GERD (gastroesophageal reflux disease) 03/18/2019  . Change in stool habits 03/18/2019  . Abnormal CT of the abdomen 03/18/2019  . Boil, leg 03/04/2019  . Screening for colorectal cancer 10/22/2018  . Screening for STDs (sexually transmitted diseases) 10/22/2018  .  Encounter for gynecological examination with Papanicolaou smear of cervix 10/22/2018  . Family planning 10/22/2018  . Plantar fasciitis, right 10/22/2018  . Carpal tunnel syndrome of right wrist 10/22/2018  . IUD (intrauterine device) in place 01/13/2016  . Migraines 03/07/2013    Past Surgical History:  Procedure Laterality Date  . COLONOSCOPY N/A 05/10/2019   Normal. next colonoscopy 05/2029  . ESOPHAGOGASTRODUODENOSCOPY N/A 05/10/2019   Hiatal hernia  . NO PAST SURGERIES      OB History    Gravida  3   Para  3   Term  3   Preterm      AB      Living  3     SAB      IAB      Ectopic      Multiple  0   Live Births  3            Home Medications    Prior to Admission medications   Medication Sig Start Date End Date Taking? Authorizing Provider  cholecalciferol (VITAMIN D3) 25 MCG (1000 UT) tablet Take 1,000 Units by mouth daily.    [provider]  levonorgestrel (MIRENA) 20 MCG/24HR IUD 1 each by Intrauterine route once.    [provider]  linaclotide Karlene Einstein) 72 MCG capsule Take 1 capsule (72 mcg total) by mouth daily before breakfast. 11/10/20   Tiffany Kocher, PA-C  nystatin-triamcinolone ointment (MYCOLOG) APPLY TO AFFECTED AREA TWICE DAILY 10/20/20  Cyril Mourning A, NP  pantoprazole (PROTONIX) 40 MG tablet Take 1 tablet (40 mg total) by mouth 2 (two) times daily before a meal. 11/10/20   Tiffany Kocher, PA-C  rizatriptan (MAXALT) 10 MG tablet TAKE 1 TABLET BY MOUTH ONCE AT ONSET OF SYMPTOMS, MAY REPEAT DOSE IN 2 HOURS IF NEEDED. 01/15/18   Eustace Moore, MD  vitamin C (ASCORBIC ACID) 500 MG tablet Take 500 mg by mouth daily.    [provider]  Wheat Dextrin (BENEFIBER PO) Take 1 Scoop by mouth daily.     [provider]    Family History Family History  Problem Relation Age of Onset  . Other Mother        enlarged heart  . Diabetes Mother   . Hypertension Mother   . Hyperlipidemia Mother   . Heart  disease Mother        heart murmer  . Miscarriages / [place] Mother   . Hypertension Father   . Hyperlipidemia Father   . Cancer Father        prostate  . Other Brother        enlarged heart; colloid cyst of the third ventricle( of the brain)  . Cancer Paternal Grandmother        leukemia  . Cancer Paternal Grandfather        lung  . Hypertension Sister   . Colon cancer Maternal Grandmother   . Colon cancer Maternal Aunt   . Gastric cancer Maternal Uncle   . Esophageal cancer Maternal Uncle 42    Social History Social History   Tobacco Use  . Smoking status: Never Smoker  . Smokeless tobacco: Never Used  Vaping Use  . Vaping Use: Never used  Substance Use Topics  . Alcohol use: Yes    Comment: occasional  . Drug use: No     Allergies   Amoxicillin and Penicillins   Review of Systems Review of Systems  Constitutional: Negative for chills and fever.  HENT: Positive for congestion, sinus pressure and sinus pain. Negative for ear pain and sore throat.   Eyes: Negative for pain and visual disturbance.  Respiratory: Negative for cough and shortness of breath.   Cardiovascular: Negative for chest pain and palpitations.  Gastrointestinal: Negative for abdominal pain and vomiting.  Genitourinary: Negative for dysuria and hematuria.  Musculoskeletal: Negative for arthralgias and back pain.  Skin: Negative for color change and rash.  Neurological: Negative for seizures and syncope.  All other systems reviewed and are negative.    Physical Exam Triage Vital Signs ED Triage Vitals  Enc Vitals Group     BP 12/27/20 1234 129/79     Pulse Rate 12/27/20 1233 79     Resp 12/27/20 1233 19     Temp 12/27/20 1233 98.2 F (36.8 C)     Temp Source 12/27/20 1233 Oral     SpO2 12/27/20 1233 95 %     Weight --      Height --      Head Circumference --      Peak Flow --      Pain Score 12/27/20 1234 0     Pain Loc --      Pain Edu? --      Excl. in GC? --    No  data found.  Updated Vital Signs BP 129/79 (BP Location: Right Arm)   Pulse 79   Temp 98.2 F (36.8 C) (Oral)   Resp 19  SpO2 97%   Visual Acuity Right Eye Distance:   Left Eye Distance:   Bilateral Distance:    Right Eye Near:   Left Eye Near:    Bilateral Near:     Physical Exam Vitals and nursing note reviewed.  Constitutional:      General: She is not in acute distress.    Appearance: She is well-developed.  HENT:     Head: Normocephalic and atraumatic.  Eyes:     Conjunctiva/sclera: Conjunctivae normal.  Cardiovascular:     Rate and Rhythm: Normal rate and regular rhythm.     Heart sounds: No murmur heard.   Pulmonary:     Effort: Pulmonary effort is normal. No respiratory distress.     Breath sounds: Normal breath sounds.  Abdominal:     Palpations: Abdomen is soft.     Tenderness: There is no abdominal tenderness.  Musculoskeletal:     Cervical back: Neck supple.  Skin:    General: Skin is warm and dry.  Neurological:     Mental Status: She is alert.      UC Treatments / Results  Labs (all labs ordered are listed, but only abnormal results are displayed) Labs Reviewed  NOVEL CORONAVIRUS, NAA    EKG   Radiology No results found.  Procedures Procedures (including critical care time)  Medications Ordered in UC Medications - No data to display  Initial Impression / Assessment and Plan / UC Course  I have reviewed the triage vital signs and the nursing notes.  Pertinent labs & imaging results that were available during my care of the patient were reviewed by me and considered in my medical decision making (see chart for details).     URI. COVID test pending.  If positive discussed self isolation instructions. Advised pt to continue zyrtec, flonase, ibuprofen, mucinex.  Return precautions discussed.  Final Clinical Impressions(s) / UC Diagnoses   Final diagnoses:  Cough  Acute upper respiratory infection     Discharge Instructions      Continue with Zyrtec and Flonase Continue with mucinex with a decongestant COVID test pending If positive self isolate 5 days from symptom onset and then wear a mask around others for 5 days.  Can taken tylenol/ibuprofen as needed for pain Push fluids    ED Prescriptions    None     PDMP not reviewed this encounter.   Jodell Cipro, PA-C 12/27/20 1253

## 2020-12-27 NOTE — ED Triage Notes (Addendum)
Sinus congestion, itchy throat, sneezing, headache  started Thursday.

## 2020-12-28 LAB — SARS-COV-2, NAA 2 DAY TAT

## 2020-12-28 LAB — NOVEL CORONAVIRUS, NAA: SARS-CoV-2, NAA: NOT DETECTED

## 2020-12-30 ENCOUNTER — Encounter: Payer: Self-pay | Admitting: Adult Health

## 2020-12-30 ENCOUNTER — Telehealth: Payer: Self-pay | Admitting: Internal Medicine

## 2020-12-30 NOTE — Telephone Encounter (Signed)
Called [facility], spoke to [place], facility on PA changed to [facility]. PA# [id number], valid 12/14/20-06/11/21. Called central scheduling, cancelled CT at [facility].  Called and informed pt facility was changed to [facility] on PA. CT order faxed to [facility].

## 2020-12-30 NOTE — Addendum Note (Signed)
Addended by: Corrie Mckusick on: 12/30/2020 01:33 PM   Modules accepted: Orders

## 2020-12-30 NOTE — Telephone Encounter (Signed)
PATIENT CALLED AND SAID THAT HER CT WAS DENIED BY HER INSURANCE, CAN WE SEND HER SOMEWHERE IN [place]?

## 2020-12-31 ENCOUNTER — Ambulatory Visit: Payer: Self-pay

## 2020-12-31 ENCOUNTER — Telehealth: Payer: Self-pay | Admitting: Emergency Medicine

## 2020-12-31 MED ORDER — DOXYCYCLINE HYCLATE 100 MG PO CAPS: 100.0000 mg | ORAL_CAPSULE | Freq: Two times a day (BID) | ORAL | 0 refills | Status: AC

## 2020-12-31 NOTE — Telephone Encounter (Signed)
Received fax from [facility], CT scheduled for 01/07/21 at 10:00am. She is to arrive at 9:00am for contrast.

## 2021-01-01 ENCOUNTER — Ambulatory Visit (HOSPITAL_COMMUNITY): Payer: BLUE CROSS/BLUE SHIELD

## 2021-01-07 DIAGNOSIS — N8302 Follicular cyst of left ovary: Secondary | ICD-10-CM | POA: Diagnosis not present

## 2021-01-07 DIAGNOSIS — R160 Hepatomegaly, not elsewhere classified: Secondary | ICD-10-CM | POA: Diagnosis not present

## 2021-01-07 DIAGNOSIS — R1030 Lower abdominal pain, unspecified: Secondary | ICD-10-CM | POA: Diagnosis not present

## 2021-01-07 DIAGNOSIS — N8301 Follicular cyst of right ovary: Secondary | ICD-10-CM | POA: Diagnosis not present

## 2021-01-07 DIAGNOSIS — L2089 Other atopic dermatitis: Secondary | ICD-10-CM | POA: Diagnosis not present

## 2021-01-07 DIAGNOSIS — R1032 Left lower quadrant pain: Secondary | ICD-10-CM | POA: Diagnosis not present

## 2021-01-07 DIAGNOSIS — R590 Localized enlarged lymph nodes: Secondary | ICD-10-CM | POA: Diagnosis not present

## 2021-01-21 ENCOUNTER — Telehealth: Payer: Self-pay | Admitting: Internal Medicine

## 2021-01-21 ENCOUNTER — Other Ambulatory Visit: Payer: Self-pay | Admitting: *Deleted

## 2021-01-21 DIAGNOSIS — R1032 Left lower quadrant pain: Secondary | ICD-10-CM

## 2021-01-21 DIAGNOSIS — R7982 Elevated C-reactive protein (CRP): Secondary | ICD-10-CM

## 2021-01-21 DIAGNOSIS — R1012 Left upper quadrant pain: Secondary | ICD-10-CM

## 2021-01-21 DIAGNOSIS — R1030 Lower abdominal pain, unspecified: Secondary | ICD-10-CM

## 2021-01-21 NOTE — Telephone Encounter (Signed)
Spoke with pt.  She was made aware of results and recommendations.  She said that she feels a lot better.  She changed her diet and cut out meats, increased veggies, eats activa daily, and eats more plant based foods.  She is agreeable to having more labs drawn.  Requested to have drawn tomorrow at [facility].  Will fax lab order over to them.  She would like to hold off on second opinion for now.  She does still have a little pain but nothing like it was.

## 2021-01-21 NOTE — Telephone Encounter (Signed)
Please tell patient I'm sorry for delay but [facility] results do not come electronically. I am glad she called to check for her results.   Her CT shows nothing to explain left sided abdominal pain. She does have an enlarged liver in the setting of obesity, LFTs are normal.  1. Recommend repeat CRP (previously elevated) to make sure normal now. 2. Recommend weight loss given enlarged liver. 3. For her chronic intermittent left sided abdominal pain I would offer second opinion at [facility], [place], or [facility].

## 2021-01-21 NOTE — Telephone Encounter (Signed)
Pt had CT done on 01/01/2021 and was calling to get her results. Please call [phone]

## 2021-01-21 NOTE — Telephone Encounter (Signed)
Noted. We will wait on lab results.

## 2021-01-21 NOTE — Telephone Encounter (Signed)
Pt called wanting CT results.  Had done at [facility].  Results in Care Everywhere.

## 2021-01-21 NOTE — Addendum Note (Signed)
Addended by: Noreene Larsson on: 01/21/2021 03:16 PM   Modules accepted: Orders

## 2021-01-22 DIAGNOSIS — R1030 Lower abdominal pain, unspecified: Secondary | ICD-10-CM | POA: Diagnosis not present

## 2021-01-22 DIAGNOSIS — R1032 Left lower quadrant pain: Secondary | ICD-10-CM | POA: Diagnosis not present

## 2021-01-22 DIAGNOSIS — R7982 Elevated C-reactive protein (CRP): Secondary | ICD-10-CM | POA: Diagnosis not present

## 2021-01-22 DIAGNOSIS — R1012 Left upper quadrant pain: Secondary | ICD-10-CM | POA: Diagnosis not present

## 2021-01-23 LAB — C-REACTIVE PROTEIN: CRP: 14.6 mg/L — ABNORMAL HIGH (ref <8.0)

## 2021-01-26 ENCOUNTER — Encounter: Payer: Self-pay | Admitting: Internal Medicine

## 2021-02-03 DIAGNOSIS — R42 Dizziness and giddiness: Secondary | ICD-10-CM | POA: Diagnosis not present

## 2021-02-03 DIAGNOSIS — Z6841 Body Mass Index (BMI) 40.0 and over, adult: Secondary | ICD-10-CM | POA: Diagnosis not present

## 2021-02-03 DIAGNOSIS — S161XXD Strain of muscle, fascia and tendon at neck level, subsequent encounter: Secondary | ICD-10-CM | POA: Diagnosis not present

## 2021-02-10 ENCOUNTER — Telehealth: Payer: Self-pay | Admitting: *Deleted

## 2021-02-10 NOTE — Telephone Encounter (Signed)
Kristin [name], This pt has upcoming appt with you for 04/19/2021. During that visit can you possibly trial her on Trulance or Amitiza because her PA for Linzess has been denied and she has been getting samples for awhile. I put 3 boxes and a Linzess savings card up front for the pt to pick up.     Phoned and advised the pt that the samples were up front with the savings card.

## 2021-02-10 NOTE — Telephone Encounter (Signed)
Patient called in requesting samples of linzess 

## 2021-02-10 NOTE — Telephone Encounter (Signed)
noted 

## 2021-02-10 NOTE — Telephone Encounter (Signed)
To discuss at upcoming ov.

## 2021-03-04 ENCOUNTER — Telehealth: Payer: Self-pay | Admitting: Internal Medicine

## 2021-03-04 NOTE — Telephone Encounter (Signed)
Lmom for pt to call me back. 

## 2021-03-04 NOTE — Telephone Encounter (Signed)
Pt LMOM around 7 pm last night saying she was having problems with her enlarged liver and her BM were pale looking. I transferred call to VM. Please call [phone]  Her OV is 7/18 and she is on a wait list if something opens up sooner.

## 2021-03-05 NOTE — Telephone Encounter (Signed)
Let's get her to check CMET, CBC, lipase first of the week. Please have another provider look for results as I'll be out of the office.

## 2021-03-05 NOTE — Telephone Encounter (Signed)
Spoke with pt.  She has been having light pale stools and cramping in the top part of stomach for 3 weeks.  BM's have pebble like consistency.  Some nausea.  No vomiting, rectal bleeding, or fever.  She has been eating more bland foods and veggies.

## 2021-03-08 ENCOUNTER — Other Ambulatory Visit: Payer: Self-pay | Admitting: *Deleted

## 2021-03-08 DIAGNOSIS — R194 Change in bowel habit: Secondary | ICD-10-CM

## 2021-03-08 DIAGNOSIS — R7982 Elevated C-reactive protein (CRP): Secondary | ICD-10-CM

## 2021-03-08 DIAGNOSIS — R1012 Left upper quadrant pain: Secondary | ICD-10-CM

## 2021-03-08 NOTE — Telephone Encounter (Signed)
Spoke with pt.  She was made aware that Tana Coast, PA-C would like her to have labs drawn this week.  She requested to go to [facility] on Wed this week.  Will fax lab requisition accordingly.

## 2021-03-10 DIAGNOSIS — R7982 Elevated C-reactive protein (CRP): Secondary | ICD-10-CM | POA: Diagnosis not present

## 2021-03-10 DIAGNOSIS — R1012 Left upper quadrant pain: Secondary | ICD-10-CM | POA: Diagnosis not present

## 2021-03-10 DIAGNOSIS — R194 Change in bowel habit: Secondary | ICD-10-CM | POA: Diagnosis not present

## 2021-03-11 LAB — CBC WITH DIFFERENTIAL/PLATELET
Absolute Monocytes: 496 cells/uL (ref 200–950)
Basophils Absolute: 27 cells/uL (ref 0–200)
Basophils Relative: 0.4 %
Eosinophils Absolute: 136 cells/uL (ref 15–500)
Eosinophils Relative: 2 %
HCT: 38.6 % (ref 35.0–45.0)
Hemoglobin: 12.1 g/dL (ref 11.7–15.5)
Lymphs Abs: 2815 cells/uL (ref 850–3900)
MCH: 25.9 pg — ABNORMAL LOW (ref 27.0–33.0)
MCHC: 31.3 g/dL — ABNORMAL LOW (ref 32.0–36.0)
MCV: 82.5 fL (ref 80.0–100.0)
MPV: 11.1 fL (ref 7.5–12.5)
Monocytes Relative: 7.3 %
Neutro Abs: 3325 cells/uL (ref 1500–7800)
Neutrophils Relative %: 48.9 %
Platelets: 328 10*3/uL (ref 140–400)
RBC: 4.68 10*6/uL (ref 3.80–5.10)
RDW: 12.3 % (ref 11.0–15.0)
Total Lymphocyte: 41.4 %
WBC: 6.8 10*3/uL (ref 3.8–10.8)

## 2021-03-11 LAB — LIPASE: Lipase: 18 U/L (ref 7–60)

## 2021-03-11 LAB — COMPREHENSIVE METABOLIC PANEL
AG Ratio: 1.5 (calc) (ref 1.0–2.5)
ALT: 16 U/L (ref 6–29)
AST: 14 U/L (ref 10–30)
Albumin: 4 g/dL (ref 3.6–5.1)
Alkaline phosphatase (APISO): 92 U/L (ref 31–125)
BUN/Creatinine Ratio: 9 (calc) (ref 6–22)
BUN: 6 mg/dL — ABNORMAL LOW (ref 7–25)
CO2: 28 mmol/L (ref 20–32)
Calcium: 8.9 mg/dL (ref 8.6–10.2)
Chloride: 104 mmol/L (ref 98–110)
Creat: 0.64 mg/dL (ref 0.50–1.10)
Globulin: 2.6 g/dL (calc) (ref 1.9–3.7)
Glucose, Bld: 83 mg/dL (ref 65–99)
Potassium: 4 mmol/L (ref 3.5–5.3)
Sodium: 138 mmol/L (ref 135–146)
Total Bilirubin: 0.8 mg/dL (ref 0.2–1.2)
Total Protein: 6.6 g/dL (ref 6.1–8.1)

## 2021-03-25 ENCOUNTER — Ambulatory Visit (INDEPENDENT_AMBULATORY_CARE_PROVIDER_SITE_OTHER): Payer: BLUE CROSS/BLUE SHIELD | Admitting: Adult Health

## 2021-03-25 ENCOUNTER — Encounter: Payer: Self-pay | Admitting: Adult Health

## 2021-03-25 ENCOUNTER — Other Ambulatory Visit (HOSPITAL_COMMUNITY)
Admission: RE | Admit: 2021-03-25 | Discharge: 2021-03-25 | Disposition: A | Discharge status: Home / Self Care | Payer: BLUE CROSS/BLUE SHIELD | Source: Ambulatory Visit | Attending: Adult Health | Admitting: Adult Health

## 2021-03-25 ENCOUNTER — Other Ambulatory Visit: Payer: Self-pay

## 2021-03-25 VITALS — BP 118/80 | HR 69 | Ht 64.0 in | Wt 273.0 lb

## 2021-03-25 DIAGNOSIS — Z01419 Encounter for gynecological examination (general) (routine) without abnormal findings: Secondary | ICD-10-CM

## 2021-03-25 DIAGNOSIS — Z8742 Personal history of other diseases of the female genital tract: Secondary | ICD-10-CM | POA: Insufficient documentation

## 2021-03-25 DIAGNOSIS — Z1211 Encounter for screening for malignant neoplasm of colon: Secondary | ICD-10-CM | POA: Insufficient documentation

## 2021-03-25 DIAGNOSIS — Z975 Presence of (intrauterine) contraceptive device: Secondary | ICD-10-CM

## 2021-03-25 DIAGNOSIS — K649 Unspecified hemorrhoids: Secondary | ICD-10-CM | POA: Diagnosis not present

## 2021-03-25 LAB — HEMOCCULT GUIAC POC 1CARD (OFFICE): Fecal Occult Blood, POC: NEGATIVE

## 2021-03-25 MED ORDER — HYDROCORT-PRAMOXINE (PERIANAL) 1-1 % EX FOAM: 1.0000 | Freq: Two times a day (BID) | CUTANEOUS | 3 refills | Status: DC

## 2021-03-25 NOTE — Progress Notes (Signed)
Patient ID: Kristin [name], female   DOB: [birth year], 40 y.o.   MRN: [id number] History of Present Illness: Kristin [name] is a 40 year old black female, divorced, G3P3 in for a well woman gyn exam and she wants a pap. Lab Results  Component Value Date   DIAGPAP (A) 03/25/2020    - Atypical squamous cells of undetermined significance (ASC-US)   HPV NOT DETECTED 10/22/2018   HPVHIGH Negative 03/25/2020   PCP is Kristin Griffith NP   Current Medications, Allergies, Past Medical History, Past Surgical History, Family History and Social History were reviewed in [employer] record.     Review of Systems:  Patient denies any headaches, hearing loss, fatigue, blurred vision, shortness of breath, chest pain, abdominal pain, problems with bowel movements,(has constipation and sees RGA) urination, or intercourse.(Not currently active).  No joint pain or mood swings.  Has rectal itching and pain at times Has had cervical lymph node and see ENT and had [place]   Physical Exam:BP 118/80 (BP Location: Right Arm, Patient Position: Sitting, Cuff Size: Large)   Pulse 69   Ht 5\' 4"  (1.626 m)   Wt 273 lb (123.8 kg)   LMP  (LMP Unknown) Comment: None since IUD placed  BMI 46.86 kg/m   General:  Well developed, well nourished, no acute distress Skin:  Warm and dry Neck:  Midline trachea, normal thyroid, good ROM, has lymph node left neck, non tender  Lungs; Clear to auscultation bilaterally Breast:  No dominant palpable mass, retraction, or nipple discharge Cardiovascular: Regular rate and rhythm Abdomen:  Soft, non tender, no hepatosplenomegaly Pelvic:  External genitalia is normal in appearance, no lesions.  The vagina is normal in appearance. Urethra has no lesions or masses. The cervix is bulbous.No IUD strings seen, pap with  GC/CHL and HR HPV genotyping performed. Uterus is felt to be normal size, shape, and contour.  No adnexal masses or tenderness noted.Bladder is non tender, no masses  felt. Rectal: Good sphincter tone, no polyps, + hemorrhoids felt.  Hemoccult negative. Has skin tag left buttock  Extremities/musculoskeletal:  No swelling or varicosities noted, no clubbing or cyanosis Psych:  No mood changes, alert and cooperative,seems happy AA is 2 Fall risk is low Depression screen [facility] 2/9 03/25/2021 03/25/2020 10/22/2018  Decreased Interest 1 1 0  Down, Depressed, Hopeless 1 0 0  PHQ - 2 Score 2 1 0  Altered sleeping 2 1 -  Tired, decreased energy 2 1 -  Change in appetite 1 0 -  Feeling bad or failure about yourself  0 0 -  Trouble concentrating 1 0 -  Moving slowly or fidgety/restless 0 0 -  Suicidal thoughts 0 0 -  PHQ-9 Score 8 3 -  Difficult doing work/chores - - -    GAD 7 : Generalized Anxiety Score 03/25/2021 03/25/2020  Nervous, Anxious, on Edge 1 1  Control/stop worrying 1 1  Worry too much - different things 1 1  Trouble relaxing 1 1  Restless 0 0  Easily annoyed or irritable 1 1  Afraid - awful might happen 1 1  Total GAD 7 Score 6 6      Upstream - 03/25/21 0919       Pregnancy Intention Screening   Does the patient want to become pregnant in the next year? No    Does the patient's partner want to become pregnant in the next year? No    Would the patient like to discuss contraceptive options today? No  Contraception Wrap Up   Current Method IUD or IUS    End Method IUD or IUS    Contraception Counseling Provided No            Examination chaperoned by Lawanna Kobus RN  Impression and Plan: 1. Encounter for gynecological examination with Papanicolaou smear of cervix Pap sent Physical in 1 year Pap in 3 if normal  2. Encounter for screening fecal occult blood testing   3. Hemorrhoids, unspecified hemorrhoid type Will rx proctofoam Meds ordered this encounter  Medications   hydrocortisone-pramoxine (PROCTOFOAM-HC) rectal foam    Sig: Place 1 applicator rectally 2 (two) times daily.    Dispense:  10 g    Refill:  3    Order  Specific Question:   Supervising Provider    Answer:   Despina Hidden, Kristin [name] [[id number]]     4. IUD (intrauterine device) in place Had mirena placed 07/30/15  5. History of abnormal cervical Pap smear Pap sent at her request, did not want to wait another 2 years

## 2021-03-26 ENCOUNTER — Other Ambulatory Visit: Payer: BLUE CROSS/BLUE SHIELD | Admitting: Adult Health

## 2021-03-29 LAB — CYTOLOGY - PAP
Chlamydia: NEGATIVE
Comment: NEGATIVE
Comment: NEGATIVE
Comment: NORMAL
Diagnosis: NEGATIVE
High risk HPV: NEGATIVE
Neisseria Gonorrhea: NEGATIVE

## 2021-04-07 ENCOUNTER — Ambulatory Visit: Payer: BLUE CROSS/BLUE SHIELD | Admitting: Cardiovascular Disease

## 2021-04-14 NOTE — Progress Notes (Deleted)
CARDIOLOGY CONSULT NOTE       Patient ID: Kristin [name] MRN: [id number] DOB/AGE: 06/05/1981 40 y.o.  Admit date: (Not on file) Referring Physician: Ahmed Prima Primary Physician: Renee Rival, NP Primary Cardiologist: New Reason for Consultation: Chest Pain  Active Problems:   * No active hospital problems. *   HPI:  40 y.o. referred by PA Starder for chest pain. History of GERD, Depression IUD/atypical cervical pathology and migraines. History of palpitations with benign monitor 03/09/20 <1% PAC/PVC not associated with symptoms Seen in ER 10/07/19 with atypical chest pain R/O related to anxiety back in ER 01/22/20 with palpitations with benign monitor as above Stress related to family and work mother with breast cancer child support issues. She has a twin sister in [place] She has been prescribed PRN Inderal in past    ***  ROS All other systems reviewed and negative except as noted above  Past Medical History:  Diagnosis Date   Allergy    Anxiety    Atypical squamous cell changes of undetermined significance (ASCUS) on cervical cytology with negative high risk human papilloma virus (HPV) test result 03/30/2020   6/21 repeat in 3 years ASCCP guidelines 5 year risk CIN 3+ 0.27%   Carpal tunnel syndrome of right wrist 10/22/2018   Contraceptive management 11/05/2013   Depression    GERD (gastroesophageal reflux disease)    IUD (intrauterine device) in place 01/13/2016   Migraines    Missed periods 01/13/2016   Vaginal Pap smear, abnormal     Family History  Problem Relation Age of Onset   Multiple myeloma Paternal Grandfather    Cancer Paternal Grandmother        leukemia   Colon cancer Maternal Grandmother    Hypertension Father    Hyperlipidemia Father    Cancer Father        prostate   Other Mother        enlarged heart   Diabetes Mother    Hypertension Mother    Hyperlipidemia Mother    Heart disease Mother        heart murmer   Miscarriages / Stillbirths  Mother    Breast cancer Mother    Other Brother        enlarged heart; colloid cyst of the third ventricle( of the brain)   Hypertension Sister    Colon cancer Maternal Aunt    Gastric cancer Maternal Uncle    Esophageal cancer Maternal Uncle 71   Stomach cancer Maternal Uncle     Social History   Socioeconomic History   Marital status: Divorced    Spouse name: Not on file   Number of children: 3   Years of education: 14   Highest education level: Not on file  Occupational History   Occupation: Administrator    Comment: [employer]  Tobacco Use   Smoking status: Never   Smokeless tobacco: Never  Vaping Use   Vaping Use: Never used  Substance and Sexual Activity   Alcohol use: Yes    Comment: occasional   Drug use: No   Sexual activity: Not Currently    Birth control/protection: I.U.D.  Other Topics Concern   Not on file  Social History Narrative   Lives with parents   Looking for own place   Tries to walk daily   Social Determinants of Health   Financial Resource Strain: Medium Risk   Difficulty of Paying Living Expenses: Somewhat hard  Food Insecurity: Food Insecurity Present   Worried  About Running Out of Food in the Last Year: Sometimes true   Ran Out of Food in the Last Year: Sometimes true  Transportation Needs: No Transportation Needs   Lack of Transportation (Medical): No   Lack of Transportation (Non-Medical): No  Physical Activity: Insufficiently Active   Days of Exercise per Week: 2 days   Minutes of Exercise per Session: 20 min  Stress: Stress Concern Present   Feeling of Stress : Rather much  Social Connections: Moderately Isolated   Frequency of Communication with Friends and Family: More than three times a week   Frequency of Social Gatherings with Friends and Family: Twice a week   Attends Religious Services: More than 4 times per year   Active Member of [employer] or Organizations: No   Attends Archivist Meetings: Never   Marital  Status: Divorced  Human resources officer Violence: Not At Risk   Fear of Current or Ex-Partner: No   Emotionally Abused: No   Physically Abused: No   Sexually Abused: No    Past Surgical History:  Procedure Laterality Date   COLONOSCOPY N/A 05/10/2019   Normal. next colonoscopy 05/2029   ESOPHAGOGASTRODUODENOSCOPY N/A 05/10/2019   Hiatal hernia   NO PAST SURGERIES        Current Outpatient Medications:    cholecalciferol (VITAMIN D3) 25 MCG (1000 UT) tablet, Take 1,000 Units by mouth daily., Disp: , Rfl:    hydrocortisone-pramoxine (PROCTOFOAM-HC) rectal foam, Place 1 applicator rectally 2 (two) times daily., Disp: 10 g, Rfl: 3   ibuprofen (ADVIL) 800 MG tablet, Take 800 mg by mouth 3 (three) times daily., Disp: , Rfl:    levonorgestrel (MIRENA) 20 MCG/24HR IUD, 1 each by Intrauterine route once., Disp: , Rfl:    linaclotide (LINZESS) 72 MCG capsule, Take 1 capsule (72 mcg total) by mouth daily before breakfast., Disp: 30 capsule, Rfl: 11   nystatin-triamcinolone ointment (MYCOLOG), APPLY TO AFFECTED AREA TWICE DAILY, Disp: 30 g, Rfl: 11   pantoprazole (PROTONIX) 40 MG tablet, Take 1 tablet (40 mg total) by mouth 2 (two) times daily before a meal. (Patient not taking: Reported on 03/25/2021), Disp: 60 tablet, Rfl: 5   RABEprazole (ACIPHEX) 20 MG tablet, Take by mouth., Disp: , Rfl:    rizatriptan (MAXALT) 10 MG tablet, TAKE 1 TABLET BY MOUTH ONCE AT ONSET OF SYMPTOMS, MAY REPEAT DOSE IN 2 HOURS IF NEEDED., Disp: 9 tablet, Rfl: 0   vitamin C (ASCORBIC ACID) 500 MG tablet, Take 500 mg by mouth daily., Disp: , Rfl:    Wheat Dextrin (BENEFIBER PO), Take 1 Scoop by mouth daily. , Disp: , Rfl:     Physical Exam: There were no vitals taken for this visit.    Affect appropriate Healthy:  appears stated age 40: normal Neck supple with no adenopathy JVP normal no bruits no thyromegaly Lungs clear with no wheezing and good diaphragmatic motion Heart:  S1/S2 no murmur, no rub, gallop or  click PMI normal Abdomen: benighn, BS positve, no tenderness, no AAA no bruit.  No HSM or HJR Distal pulses intact with no bruits No edema Neuro non-focal Skin warm and dry No muscular weakness   Labs:   Lab Results  Component Value Date   WBC 6.8 03/10/2021   HGB 12.1 03/10/2021   HCT 38.6 03/10/2021   MCV 82.5 03/10/2021   PLT 328 03/10/2021   No results for input(s): NA, K, CL, CO2, BUN, CREATININE, CALCIUM, PROT, BILITOT, ALKPHOS, ALT, AST, GLUCOSE in the last 168 hours.  Invalid input(s): LABALBU Lab Results  Component Value Date   TROPONINI <0.03 12/30/2018    Lab Results  Component Value Date   CHOL 158 06/23/2017   Lab Results  Component Value Date   HDL 49 (L) 06/23/2017   Lab Results  Component Value Date   LDLCALC 94 06/23/2017   Lab Results  Component Value Date   TRIG 66 06/23/2017   Lab Results  Component Value Date   CHOLHDL 3.2 06/23/2017   No results found for: LDLDIRECT    Radiology: No results found.  EKG: SR rate 82 normal    ASSESSMENT AND PLAN:   Chest Pain: atypical related to anxiety in past. ECG normal Favor ETT And coronary calcium score to further risk stratify Palpitations: benign monitor 03/09/20 ok observe PRN Inderal Anxiety: F/U primary consider anxiolytic anti depressant GERD:  continue protonix Migraines:  stable has Maxalt prescribed  ETT Coronary calcium score  F/U PRN if normal   Signed: Jenkins Rouge 04/14/2021, 8:20 PM

## 2021-04-19 ENCOUNTER — Encounter: Payer: Self-pay | Admitting: Gastroenterology

## 2021-04-19 ENCOUNTER — Other Ambulatory Visit: Payer: Self-pay | Admitting: *Deleted

## 2021-04-19 ENCOUNTER — Ambulatory Visit (INDEPENDENT_AMBULATORY_CARE_PROVIDER_SITE_OTHER): Payer: BLUE CROSS/BLUE SHIELD | Admitting: Gastroenterology

## 2021-04-19 ENCOUNTER — Other Ambulatory Visit: Payer: Self-pay

## 2021-04-19 VITALS — BP 123/77 | HR 64 | Temp 97.0°F | Ht 64.0 in | Wt 274.4 lb

## 2021-04-19 DIAGNOSIS — R160 Hepatomegaly, not elsewhere classified: Secondary | ICD-10-CM

## 2021-04-19 DIAGNOSIS — K219 Gastro-esophageal reflux disease without esophagitis: Secondary | ICD-10-CM | POA: Diagnosis not present

## 2021-04-19 DIAGNOSIS — K5900 Constipation, unspecified: Secondary | ICD-10-CM | POA: Insufficient documentation

## 2021-04-19 DIAGNOSIS — R109 Unspecified abdominal pain: Secondary | ICD-10-CM

## 2021-04-19 DIAGNOSIS — R7982 Elevated C-reactive protein (CRP): Secondary | ICD-10-CM | POA: Insufficient documentation

## 2021-04-19 DIAGNOSIS — Z1159 Encounter for screening for other viral diseases: Secondary | ICD-10-CM | POA: Diagnosis not present

## 2021-04-19 MED ORDER — TRULANCE 3 MG PO TABS: 3.0000 mg | ORAL_TABLET | Freq: Every day | ORAL | 3 refills | Status: DC

## 2021-04-19 NOTE — Progress Notes (Signed)
Primary Care Physician: Erasmo Downer, NP  Primary Gastroenterologist:  Roetta Sessions, MD   Chief Complaint  Patient presents with   Follow-up    Follow on blood work and left side pain.    HPI: Kristin [name] is a 40 y.o. female here for further evaluation of chronic left sided abdominal pain, enlarged liver, elevated CRP, GERD. Last seen in 11/2020.   H/o intermittent left sided abdominal pain for over 2 years. Extensive work up as outlined below. Suspected element of functional abdominal pain/IBS. She has had elevated CRP without known etiology dating back to 11/2020. Called in with concerns about stools being pale and abdominal pain back in 03/2021. Labs were completed and showed: bun 5, cre 0.64, alb 4, tbili 0.8, ap 92, ast 14, alt 16, lipase 18, hgb 12.1, platelets 328000, wbc 6800. CT back in 01/2021 as outlined below with hepatomegaly. Had not been present on CT 2020.   Patient states she is now prediabetic. Overall her left sided abdominal pain has not been as bad since she is taking Linzess more regularly. BMs regular. She takes Linzess every other day as her insurance will not cover. She has been getting samples. No melena, brbpr. Heartburn ok on aciphex. Never switched to pantoprazole because she wanted to finish her stock of aciphex. No vomiting. She has noted intermittent abdominal cramping if she eats meat several times a day. Has tried to eat meat only once per day. Once it happened with a hamburger from [employer]. Once after bacon. Tolerates lean chicken.  No known tick bites. Gallbladder remains in situ.       Prior work-up:   CT A/P with contrast 01/2021: hepatomegaly with max span 23.6cm, IUD present in fundal endometrial cavity.   Abdominal f x-ray October 2021 with average stool burden, no acute findings.  Added Linzess 72 mcg daily for 2 weeks.   CT May 2020 demonstrated small left kidney stone, enlarged left ovarian vein felt to be nonspecific but could be  seen in patients with pelvic congestion syndrome, but no other significant findings.   Pelvic ultrasound October 2020 for left lower quadrant pain showed clinically insignificant myoma, IUD in proper location, both ovaries normal.   EGD and colonoscopy in August 2020 showed hiatal hernia but otherwise unremarkable.  Current Outpatient Medications  Medication Sig Dispense Refill   cholecalciferol (VITAMIN D3) 25 MCG (1000 UT) tablet Take 1,000 Units by mouth daily.     hydrocortisone-pramoxine (PROCTOFOAM-HC) rectal foam Place 1 applicator rectally 2 (two) times daily. 10 g 3   ibuprofen (ADVIL) 800 MG tablet Take 800 mg by mouth 3 (three) times daily.     levonorgestrel (MIRENA) 20 MCG/24HR IUD 1 each by Intrauterine route once.     linaclotide (LINZESS) 72 MCG capsule Take 1 capsule (72 mcg total) by mouth daily before breakfast. 30 capsule 11   nystatin-triamcinolone ointment (MYCOLOG) APPLY TO AFFECTED AREA TWICE DAILY 30 g 11   RABEprazole (ACIPHEX) 20 MG tablet Take by mouth.     rizatriptan (MAXALT) 10 MG tablet TAKE 1 TABLET BY MOUTH ONCE AT ONSET OF SYMPTOMS, MAY REPEAT DOSE IN 2 HOURS IF NEEDED. 9 tablet 0   vitamin C (ASCORBIC ACID) 500 MG tablet Take 500 mg by mouth daily.     Wheat Dextrin (BENEFIBER PO) Take 1 Scoop by mouth daily.      No current facility-administered medications for this visit.    Allergies as of 04/19/2021 - Review Complete 04/19/2021  Allergen Reaction Noted   Amoxicillin Swelling 08/04/2020   Penicillins Swelling 05/03/2020   Venlafaxine  08/24/2020    ROS:  General: Negative for anorexia, weight loss, fever, chills, fatigue, weakness. Complains of arthritis.  ENT: Negative for hoarseness, difficulty swallowing , nasal congestion. CV: Negative for chest pain, angina, palpitations, dyspnea on exertion, peripheral edema.  Respiratory: Negative for dyspnea at rest, dyspnea on exertion, cough, sputum, wheezing.  GI: See history of present  illness. GU:  Negative for dysuria, hematuria, urinary incontinence, urinary frequency, nocturnal urination.  Endo: Negative for unusual weight change.    Physical Examination:   BP 123/77   Pulse 64   Temp (!) 97 F (36.1 C) (Temporal)   Ht 5\' 4"  (1.626 m)   Wt 274 lb 6.4 oz (124.5 kg)   LMP  (LMP Unknown) Comment: None since IUD placed  BMI 47.10 kg/m   General: Well-nourished, well-developed in no acute distress.  Eyes: No icterus. Mouth: masked Lungs: Clear to auscultation bilaterally.  Heart: Regular rate and rhythm, no murmurs rubs or gallops.  Abdomen: Bowel sounds are normal, nontender, nondistended, no hepatosplenomegaly or masses, no abdominal bruits or hernia , no rebound or guarding.   Extremities: No lower extremity edema. No clubbing or deformities. Neuro: Alert and oriented x 4   Skin: Warm and dry, no jaundice.   Psych: Alert and cooperative, normal mood and affect.  Labs:  As in hpi   Imaging Studies: See hpi   Assessment:  Abdominal pain: left sided, intermittent for over 2 years. Currently doing well with better management of constipation. Suspect element of IBS/functional abdominal pain. Will continue aggressive management of constipation but need to switch to Trulance which is covered by her insurance.   GERD: continue aciphex. Reinforced antireflux measures. Weight reduction would be beneficial.   Hepatomegaly: not noted on imaging in 2020 but present on CT 01/2021. May be in part due to her size but need to consider other etiologies. LFTs currently normal, has had minimal elevation of alkphos in past. First line work up checking for iron overload, viral hepatitis, autoimmune process given abnormal CRP (repeatedly). Reinforced need for weight reduction, increase physical activity. Patient has looked into weight loss surgery options and may consider "second" appointment. [facility]iscussed [facility]. She will call if wants referral.     Plan: Labs including CRP, LFTs, CBC, Hep B and C markers, autoimmune serologies.  Stop Linzess and start Trulance 3mg  daily. Samples and RX provided.  Consider [facility]. She will call if desires referral. First she wants to pursue additional appt with weight loss surgery provider.  Return ov in six months or sooner if needed.

## 2021-04-19 NOTE — Patient Instructions (Signed)
Stop Linzess. Start Trulance 3mg  daily for constipation. Samples provided. RX sent to [facility].  Please have labs done at your convenience.  Try to lose weight and be more physically active. If you decide you would like to be seen at [facility], I can give you a referral.  We will see you back in six months, call sooner if you need anything.

## 2021-04-20 ENCOUNTER — Telehealth: Payer: Self-pay

## 2021-04-20 NOTE — Telephone Encounter (Signed)
Pt's Trulance 3 mg tablets have been approved from 04/20/2021 through 04/19/2022. Dx: K59.04-CIC and K58.1-IBS-C   Phoned to [facility] in [place] advised the pharmacist of the PA being approved.  Phoned and LMOVM of the pt that PA was approved for Trulance 3 mg.  Will give to Darl Pikes to send to scan.

## 2021-04-22 ENCOUNTER — Ambulatory Visit: Payer: BLUE CROSS/BLUE SHIELD | Admitting: Cardiovascular Disease

## 2021-04-23 DIAGNOSIS — U071 COVID-19: Secondary | ICD-10-CM | POA: Diagnosis not present

## 2021-04-23 DIAGNOSIS — Z6841 Body Mass Index (BMI) 40.0 and over, adult: Secondary | ICD-10-CM | POA: Diagnosis not present

## 2021-04-25 LAB — CBC WITH DIFFERENTIAL/PLATELET
Absolute Monocytes: 413 cells/uL (ref 200–950)
Basophils Absolute: 30 cells/uL (ref 0–200)
Basophils Relative: 0.5 %
Eosinophils Absolute: 89 cells/uL (ref 15–500)
Eosinophils Relative: 1.5 %
HCT: 39.7 % (ref 35.0–45.0)
Hemoglobin: 12.3 g/dL (ref 11.7–15.5)
Lymphs Abs: 2242 cells/uL (ref 850–3900)
MCH: 26.1 pg — ABNORMAL LOW (ref 27.0–33.0)
MCHC: 31 g/dL — ABNORMAL LOW (ref 32.0–36.0)
MCV: 84.3 fL (ref 80.0–100.0)
MPV: 11.1 fL (ref 7.5–12.5)
Monocytes Relative: 7 %
Neutro Abs: 3127 cells/uL (ref 1500–7800)
Neutrophils Relative %: 53 %
Platelets: 289 10*3/uL (ref 140–400)
RBC: 4.71 10*6/uL (ref 3.80–5.10)
RDW: 12.7 % (ref 11.0–15.0)
Total Lymphocyte: 38 %
WBC: 5.9 10*3/uL (ref 3.8–10.8)

## 2021-04-25 LAB — ANA,IFA RA DIAG PNL W/RFLX TIT/PATN
Anti Nuclear Antibody (ANA): NEGATIVE
Cyclic Citrullin Peptide Ab: <16 UNITS
Rheumatoid fact SerPl-aCnc: <14 IU/mL (ref <14)

## 2021-04-25 LAB — IRON,TIBC AND FERRITIN PANEL
%SAT: 27 % (calc) (ref 16–45)
Ferritin: 52 ng/mL (ref 16–232)
Iron: 74 ug/dL (ref 40–190)
TIBC: 279 mcg/dL (calc) (ref 250–450)

## 2021-04-25 LAB — HEPATIC FUNCTION PANEL
AG Ratio: 1.6 (calc) (ref 1.0–2.5)
ALT: 11 U/L (ref 6–29)
AST: 13 U/L (ref 10–30)
Albumin: 4.1 g/dL (ref 3.6–5.1)
Alkaline phosphatase (APISO): 97 U/L (ref 31–125)
Bilirubin, Direct: 0.1 mg/dL (ref 0.0–0.2)
Globulin: 2.5 g/dL (calc) (ref 1.9–3.7)
Indirect Bilirubin: 0.6 mg/dL (calc) (ref 0.2–1.2)
Total Bilirubin: 0.7 mg/dL (ref 0.2–1.2)
Total Protein: 6.6 g/dL (ref 6.1–8.1)

## 2021-04-25 LAB — IGG, IGA, IGM
IgG (Immunoglobin G), Serum: 1162 mg/dL (ref 600–1640)
IgM, Serum: 117 mg/dL (ref 50–300)
Immunoglobulin A: 251 mg/dL (ref 47–310)

## 2021-04-25 LAB — HEPATITIS C ANTIBODY
Hepatitis C Ab: NONREACTIVE
SIGNAL TO CUT-OFF: 0.02 (ref <1.00)

## 2021-04-25 LAB — HEPATITIS B SURFACE ANTIGEN: Hepatitis B Surface Ag: NONREACTIVE

## 2021-04-25 LAB — ANTI-SMOOTH MUSCLE ANTIBODY, IGG: Actin (Smooth Muscle) Antibody (IGG): <20 U (ref <20)

## 2021-04-25 LAB — C-REACTIVE PROTEIN: CRP: 13.6 mg/L — ABNORMAL HIGH (ref <8.0)

## 2021-04-25 LAB — MITOCHONDRIAL ANTIBODIES: Mitochondrial M2 Ab, IgG: <=20 U

## 2021-05-10 ENCOUNTER — Ambulatory Visit: Payer: BLUE CROSS/BLUE SHIELD | Admitting: Cardiovascular Disease

## 2021-05-10 DIAGNOSIS — G43809 Other migraine, not intractable, without status migrainosus: Secondary | ICD-10-CM | POA: Diagnosis not present

## 2021-05-10 DIAGNOSIS — K760 Fatty (change of) liver, not elsewhere classified: Secondary | ICD-10-CM | POA: Diagnosis not present

## 2021-05-10 DIAGNOSIS — Z7189 Other specified counseling: Secondary | ICD-10-CM | POA: Diagnosis not present

## 2021-05-10 DIAGNOSIS — K219 Gastro-esophageal reflux disease without esophagitis: Secondary | ICD-10-CM | POA: Diagnosis not present

## 2021-05-17 NOTE — Progress Notes (Signed)
CARDIOLOGY CONSULT NOTE       Patient ID: Kristin [name] MRN: [id number] DOB/AGE: Jan 23, 1981 40 y.o.  Admit date: (Not on file) Referring Physician: Ahmed Prima Primary Physician: Quinn Plowman, MD Primary Cardiologist: New Reason for Consultation: Chest Pain  Active Problems:   * No active hospital problems. *   HPI:  40 y.o.f/u for chest pain. History of GERD, Depression IUD/atypical cervical pathology and migraines. History of palpitations with benign monitor 03/09/20 <1% PAC/PVC not associated with symptoms Seen in ER 10/07/19 with atypical chest pain R/O related to anxiety back in ER 01/22/20 with palpitations with benign monitor as above Stress related to family and work mother with breast cancer child support issues. She has a twin sister in [place] She has been prescribed PRN Inderal in past   Sees GI for chronic left sided abdominal pain ? Functional /IBS EGD/colon just hiatal hernia ? Hepatomegaly on CT 2020 and elevated CRP   Pre Diabetic ? A1c only 5.2    Anxiety a bit worse lately  No heart symptoms  Covid in July with mild symptoms   ROS All other systems reviewed and negative except as noted above  Past Medical History:  Diagnosis Date   Allergy    Anxiety    Atypical squamous cell changes of undetermined significance (ASCUS) on cervical cytology with negative high risk human papilloma virus (HPV) test result 03/30/2020   6/21 repeat in 3 years ASCCP guidelines 5 year risk CIN 3+ 0.27%   Carpal tunnel syndrome of right wrist 10/22/2018   Contraceptive management 11/05/2013   Depression    GERD (gastroesophageal reflux disease)    IUD (intrauterine device) in place 01/13/2016   Migraines    Missed periods 01/13/2016   Vaginal Pap smear, abnormal     Family History  Problem Relation Age of Onset   Multiple myeloma Paternal Grandfather    Cancer Paternal Grandmother        leukemia   Colon cancer Maternal Grandmother    Hypertension Father    Hyperlipidemia  Father    Cancer Father        prostate   Other Mother        enlarged heart   Diabetes Mother    Hypertension Mother    Hyperlipidemia Mother    Heart disease Mother        heart murmer   Miscarriages / Stillbirths Mother    Breast cancer Mother    Other Brother        enlarged heart; colloid cyst of the third ventricle( of the brain)   Hypertension Sister    Colon cancer Maternal Aunt    Gastric cancer Maternal Uncle    Esophageal cancer Maternal Uncle 71   Stomach cancer Maternal Uncle     Social History   Socioeconomic History   Marital status: Divorced    Spouse name: Not on file   Number of children: 3   Years of education: 14   Highest education level: Not on file  Occupational History   Occupation: Administrator    Comment: [employer]  Tobacco Use   Smoking status: Never   Smokeless tobacco: Never  Vaping Use   Vaping Use: Never used  Substance and Sexual Activity   Alcohol use: Yes    Comment: occasional   Drug use: No   Sexual activity: Not Currently    Birth control/protection: I.U.D.  Other Topics Concern   Not on file  Social History Narrative   Lives  with parents   Looking for own place   Tries to walk daily   Social Determinants of Health   Financial Resource Strain: Medium Risk   Difficulty of Paying Living Expenses: Somewhat hard  Food Insecurity: Food Insecurity Present   Worried About Charity fundraiser in the Last Year: Sometimes true   Ran Out of Food in the Last Year: Sometimes true  Transportation Needs: No Transportation Needs   Lack of Transportation (Medical): No   Lack of Transportation (Non-Medical): No  Physical Activity: Insufficiently Active   Days of Exercise per Week: 2 days   Minutes of Exercise per Session: 20 min  Stress: Stress Concern Present   Feeling of Stress : Rather much  Social Connections: Moderately Isolated   Frequency of Communication with Friends and Family: More than three times a week    Frequency of Social Gatherings with Friends and Family: Twice a week   Attends Religious Services: More than 4 times per year   Active Member of [employer] or Organizations: No   Attends Archivist Meetings: Never   Marital Status: Divorced  Human resources officer Violence: Not At Risk   Fear of Current or Ex-Partner: No   Emotionally Abused: No   Physically Abused: No   Sexually Abused: No    Past Surgical History:  Procedure Laterality Date   COLONOSCOPY N/A 05/10/2019   Normal. next colonoscopy 05/2029   ESOPHAGOGASTRODUODENOSCOPY N/A 05/10/2019   Hiatal hernia   NO PAST SURGERIES        Current Outpatient Medications:    cholecalciferol (VITAMIN D3) 25 MCG (1000 UT) tablet, Take 1,000 Units by mouth daily., Disp: , Rfl:    EPINEPHrine 0.3 mg/0.3 mL IJ SOAJ injection, See admin instructions., Disp: , Rfl:    ibuprofen (ADVIL) 800 MG tablet, Take 800 mg by mouth 3 (three) times daily., Disp: , Rfl:    levonorgestrel (MIRENA) 20 MCG/24HR IUD, 1 each by Intrauterine route once., Disp: , Rfl:    nystatin-triamcinolone ointment (MYCOLOG), APPLY TO AFFECTED AREA TWICE DAILY, Disp: 30 g, Rfl: 11   pantoprazole (PROTONIX) 20 MG tablet, Take 20 mg by mouth 2 (two) times daily., Disp: , Rfl:    Plecanatide (TRULANCE) 3 MG TABS, Take 3 mg by mouth daily., Disp: 90 tablet, Rfl: 3   rizatriptan (MAXALT) 10 MG tablet, TAKE 1 TABLET BY MOUTH ONCE AT ONSET OF SYMPTOMS, MAY REPEAT DOSE IN 2 HOURS IF NEEDED., Disp: 9 tablet, Rfl: 0   vitamin C (ASCORBIC ACID) 500 MG tablet, Take 500 mg by mouth daily., Disp: , Rfl:    Wheat Dextrin (BENEFIBER PO), Take 1 Scoop by mouth daily. , Disp: , Rfl:     Physical Exam: Blood pressure 120/60, pulse 81, height _0  (1.626 m), weight 123.4 kg, SpO2 98 %.    Affect appropriate Healthy:  appears stated age 35: normal Neck supple with no adenopathy JVP normal no bruits no thyromegaly Lungs clear with no wheezing and good diaphragmatic motion Heart:  S1/S2  no murmur, no rub, gallop or click PMI normal Abdomen: benighn, BS positve, no tenderness, no AAA no bruit.  No HSM or HJR Distal pulses intact with no bruits No edema Neuro non-focal Skin warm and dry No muscular weakness   Labs:   Lab Results  Component Value Date   WBC 5.9 04/19/2021   HGB 12.3 04/19/2021   HCT 39.7 04/19/2021   MCV 84.3 04/19/2021   PLT 289 04/19/2021   No results for input(s):  NA, K, CL, CO2, BUN, CREATININE, CALCIUM, PROT, BILITOT, ALKPHOS, ALT, AST, GLUCOSE in the last 168 hours.  Invalid input(s): LABALBU Lab Results  Component Value Date   TROPONINI <0.03 12/30/2018    Lab Results  Component Value Date   CHOL 158 06/23/2017   Lab Results  Component Value Date   HDL 49 (L) 06/23/2017   Lab Results  Component Value Date   LDLCALC 94 06/23/2017   Lab Results  Component Value Date   TRIG 66 06/23/2017   Lab Results  Component Value Date   CHOLHDL 3.2 06/23/2017   No results found for: LDLDIRECT    Radiology: No results found.  EKG: SR rate 82 normal    ASSESSMENT AND PLAN:   Chest Pain: atypical related to anxiety in past. ECG normal Observe  Palpitations: benign monitor 03/09/20 ok observe PRN Inderal Anxiety: F/U primary consider anxiolytic anti depressant GERD:  continue protonix Migraines:  stable has Maxalt prescribed  F/U in a year   Signed: Jenkins Rouge 05/21/2021, 4:12 PM

## 2021-05-21 ENCOUNTER — Ambulatory Visit: Payer: BLUE CROSS/BLUE SHIELD | Admitting: Cardiovascular Disease

## 2021-05-21 ENCOUNTER — Other Ambulatory Visit: Payer: Self-pay

## 2021-05-21 ENCOUNTER — Encounter: Payer: Self-pay | Admitting: Cardiovascular Disease

## 2021-05-21 VITALS — BP 120/60 | HR 81 | Ht 64.0 in | Wt 272.0 lb

## 2021-05-21 DIAGNOSIS — R079 Chest pain, unspecified: Secondary | ICD-10-CM | POA: Diagnosis not present

## 2021-05-21 DIAGNOSIS — F411 Generalized anxiety disorder: Secondary | ICD-10-CM | POA: Diagnosis not present

## 2021-05-21 DIAGNOSIS — R002 Palpitations: Secondary | ICD-10-CM | POA: Diagnosis not present

## 2021-05-21 NOTE — Patient Instructions (Signed)
Medication Instructions:  Your physician recommends that you continue on your current medications as directed. Please refer to the Current Medication list given to you today.  *If you need a refill on your cardiac medications before your next appointment, please call your pharmacy*   Lab Work: NONE   If you have labs (blood work) drawn today and your tests are completely normal, you will receive your results only by: . MyChart Message (if you have MyChart) OR . A paper copy in the mail If you have any lab test that is abnormal or we need to change your treatment, we will call you to review the results.   Testing/Procedures: NONE    Follow-Up: At [facility], you and your health needs are our priority.  As part of our continuing mission to provide you with exceptional heart care, we have created designated Provider Care Teams.  These Care Teams include your primary Cardiologist (physician) and Advanced Practice Providers (APPs -  Physician Assistants and Nurse Practitioners) who all work together to provide you with the care you need, when you need it.  We recommend signing up for the patient portal called "MyChart".  Sign up information is provided on this After Visit Summary.  MyChart is used to connect with patients for Virtual Visits (Telemedicine).  Patients are able to view lab/test results, encounter notes, upcoming appointments, etc.  Non-urgent messages can be sent to your provider as well.   To learn more about what you can do with MyChart, go to [email or web address].    Your next appointment:   1 year(s)  The format for your next appointment:   In Person  Provider:   Peter Nishan, MD   Other Instructions Thank you for choosing [facility]!    

## 2021-06-22 ENCOUNTER — Ambulatory Visit (INDEPENDENT_AMBULATORY_CARE_PROVIDER_SITE_OTHER): Payer: BLUE CROSS/BLUE SHIELD | Admitting: Adult Health

## 2021-06-22 ENCOUNTER — Encounter: Payer: Self-pay | Admitting: Adult Health

## 2021-06-22 ENCOUNTER — Other Ambulatory Visit: Payer: Self-pay

## 2021-06-22 VITALS — BP 124/77 | HR 71 | Ht 64.0 in | Wt 273.0 lb

## 2021-06-22 DIAGNOSIS — Z131 Encounter for screening for diabetes mellitus: Secondary | ICD-10-CM | POA: Insufficient documentation

## 2021-06-22 DIAGNOSIS — L0292 Furuncle, unspecified: Secondary | ICD-10-CM | POA: Diagnosis not present

## 2021-06-22 MED ORDER — DOXYCYCLINE HYCLATE 100 MG PO TABS: 100.0000 mg | ORAL_TABLET | Freq: Two times a day (BID) | ORAL | 0 refills | Status: DC

## 2021-06-22 MED ORDER — PROMETHAZINE HCL 25 MG PO TABS
25.0000 mg | ORAL_TABLET | Freq: Four times a day (QID) | ORAL | 1 refills | Status: DC | PRN
Start: 2021-06-22 — End: 2021-10-26

## 2021-06-22 NOTE — Progress Notes (Signed)
  Subjective:     Patient ID: Kailany Dinunzio, female   DOB: July 06, 1981, 40 y.o.   MRN: [id number]  HPI Anvi is a 40 year old black female,divorced, G3P3, worked in for boil right inner thigh, it started Friday and has gotten bigger, and is painful. She has been using warm compresses, and has had some nausea, she denies being diabetic.[name] PCP is Dr Nickola Major. Lab Results  Component Value Date   DIAGPAP  03/25/2021    - Negative for intraepithelial lesion or malignancy (NILM)   HPV NOT DETECTED 10/22/2018   HPVHIGH Negative 03/25/2021     Review of Systems +boil right inner thigh,painful + nausea Reviewed past medical,surgical, social and family history. Reviewed medications and allergies.     Objective:   Physical Exam BP 124/77 (BP Location: Left Arm, Patient Position: Sitting, Cuff Size: Large)   Pulse 71   Ht 5\' 4"  (1.626 m)   Wt 273 lb (123.8 kg)   BMI 46.86 kg/m  skin warm and dry, has 7 cm soft tissue swelling and firmness right inner thigh, has small induration. Dr for co exam. Fall risk is low  Upstream - 06/22/21 0854       Pregnancy Intention Screening   Does the patient want to become pregnant in the next year? No    Does the patient's partner want to become pregnant in the next year? No    Would the patient like to discuss contraceptive options today? No      Contraception Wrap Up   Current Method IUD or IUS    End Method IUD or IUS    Contraception Counseling Provided No            Examination chaperoned by Tish RN    Assessment:     1. Boil Continue warm compresses Will rx doxycycline to calm tissue down, and possible I&D in 3 days  Meds ordered this encounter  Medications   doxycycline (VIBRA-TABS) 100 MG tablet    Sig: Take 1 tablet (100 mg total) by mouth 2 (two) times daily.    Dispense:  20 tablet    Refill:  0    Order Specific Question:   Supervising Provider    Answer:   06/24/21 H [[id number]]   promethazine (PHENERGAN) 25 MG tablet     Sig: Take 1 tablet (25 mg total) by mouth every 6 (six) hours as needed for nausea or vomiting.    Dispense:  30 tablet    Refill:  1    Order Specific Question:   Supervising Provider    Answer:   Duane Lope H [[id number]]   Do not wear tight clothing Do not squeeze Can take tylenol and advil for pain Review handout on boils  2. Screening for diabetes mellitus Check A1c    Plan:     Follow up in 3 days with Dr Duane Lope for possible I&D

## 2021-06-23 LAB — HEMOGLOBIN A1C
Est. average glucose Bld gHb Est-mCnc: 103 mg/dL
Hgb A1c MFr Bld: 5.2 % (ref 4.8–5.6)

## 2021-06-25 ENCOUNTER — Encounter: Payer: Self-pay | Admitting: Obstetrics & Gynecology

## 2021-06-25 ENCOUNTER — Ambulatory Visit (INDEPENDENT_AMBULATORY_CARE_PROVIDER_SITE_OTHER): Payer: BLUE CROSS/BLUE SHIELD | Admitting: Obstetrics & Gynecology

## 2021-06-25 ENCOUNTER — Other Ambulatory Visit: Payer: Self-pay

## 2021-06-25 VITALS — BP 109/73 | HR 77 | Ht 64.0 in | Wt 272.5 lb

## 2021-06-25 DIAGNOSIS — L0292 Furuncle, unspecified: Secondary | ICD-10-CM | POA: Diagnosis not present

## 2021-06-25 MED ORDER — SILVER SULFADIAZINE 1 % EX CREA: TOPICAL_CREAM | CUTANEOUS | 11 refills | Status: DC

## 2021-06-25 NOTE — Progress Notes (Signed)
Chief Complaint  Patient presents with   Follow-up    On boil      40 y.o. [id number] No LMP recorded. (Menstrual status: IUD). The current method of family planning is IUD.  Outpatient Encounter Medications as of 06/25/2021  Medication Sig   cholecalciferol (VITAMIN D3) 25 MCG (1000 UT) tablet Take 1,000 Units by mouth daily.   doxycycline (VIBRA-TABS) 100 MG tablet Take 1 tablet (100 mg total) by mouth 2 (two) times daily.   EPINEPHrine 0.3 mg/0.3 mL IJ SOAJ injection See admin instructions.   ibuprofen (ADVIL) 800 MG tablet Take 800 mg by mouth 3 (three) times daily.   levonorgestrel (MIRENA) 20 MCG/24HR IUD 1 each by Intrauterine route once.   nystatin-triamcinolone ointment (MYCOLOG) APPLY TO AFFECTED AREA TWICE DAILY   pantoprazole (PROTONIX) 20 MG tablet Take 20 mg by mouth 2 (two) times daily.   pantoprazole (PROTONIX) 40 MG tablet Take 40 mg by mouth 2 (two) times daily.   Plecanatide (TRULANCE) 3 MG TABS Take 3 mg by mouth daily.   rizatriptan (MAXALT) 10 MG tablet TAKE 1 TABLET BY MOUTH ONCE AT ONSET OF SYMPTOMS, MAY REPEAT DOSE IN 2 HOURS IF NEEDED.   silver sulfADIAZINE (SILVADENE) 1 % cream Apply 3-4 times daily   vitamin C (ASCORBIC ACID) 500 MG tablet Take 500 mg by mouth daily.   Wheat Dextrin (BENEFIBER PO) Take 1 Scoop by mouth daily.   promethazine (PHENERGAN) 25 MG tablet Take 1 tablet (25 mg total) by mouth every 6 (six) hours as needed for nausea or vomiting. (Patient not taking: Reported on 06/25/2021)   No facility-administered encounter medications on file as of 06/25/2021.    Subjective Pt seen today to I&D a right inner thigh abscess  She was placed on doxycycline 3 days ago because of suspicion of MRSA It drained spontaneously 2 days ago Pt states it feels better No fever Past Medical History:  Diagnosis Date   Allergy    Anxiety    Atypical squamous cell changes of undetermined significance (ASCUS) on cervical cytology with negative high risk  human papilloma virus (HPV) test result 03/30/2020   6/21 repeat in 3 years ASCCP guidelines 5 year risk CIN 3+ 0.27%   Carpal tunnel syndrome of right wrist 10/22/2018   Contraceptive management 11/05/2013   Depression    GERD (gastroesophageal reflux disease)    IUD (intrauterine device) in place 01/13/2016   Migraines    Missed periods 01/13/2016   Vaginal Pap smear, abnormal     Past Surgical History:  Procedure Laterality Date   COLONOSCOPY N/A 05/10/2019   Normal. next colonoscopy 05/2029   ESOPHAGOGASTRODUODENOSCOPY N/A 05/10/2019   Hiatal hernia   NO PAST SURGERIES      OB History     Gravida  3   Para  3   Term  3   Preterm      AB      Living  3      SAB      IAB      Ectopic      Multiple  0   Live Births  3           Allergies  Allergen Reactions   Amoxicillin Swelling   Penicillins Swelling   Venlafaxine     Other reaction(s): heart palpitations    Social History   Socioeconomic History   Marital status: Divorced    Spouse name: Not on file   Number of children:  3   Years of education: 14   Highest education level: Not on file  Occupational History   Occupation: program assistant    Comment: [employer]  Tobacco Use   Smoking status: Never   Smokeless tobacco: Never  Vaping Use   Vaping Use: Never used  Substance and Sexual Activity   Alcohol use: Yes    Comment: occasional   Drug use: No   Sexual activity: Yes    Birth control/protection: I.U.D.  Other Topics Concern   Not on file  Social History Narrative   Lives with parents   Looking for own place   Tries to walk daily   Social Determinants of Health   Financial Resource Strain: Medium Risk   Difficulty of Paying Living Expenses: Somewhat hard  Food Insecurity: Food Insecurity Present   Worried About Charity fundraiser in the Last Year: Sometimes true   Arboriculturist in the Last Year: Sometimes true  Transportation Needs: No Transportation Needs   Lack of  Transportation (Medical): No   Lack of Transportation (Non-Medical): No  Physical Activity: Insufficiently Active   Days of Exercise per Week: 2 days   Minutes of Exercise per Session: 20 min  Stress: Stress Concern Present   Feeling of Stress : Rather much  Social Connections: Moderately Isolated   Frequency of Communication with Friends and Family: More than three times a week   Frequency of Social Gatherings with Friends and Family: Twice a week   Attends Religious Services: More than 4 times per year   Active Member of [employer] or Organizations: No   Attends Music therapist: Never   Marital Status: Divorced    Family History  Problem Relation Age of Onset   Multiple myeloma Paternal Grandfather    Cancer Paternal Grandmother        leukemia   Colon cancer Maternal Grandmother    Hypertension Father    Hyperlipidemia Father    Cancer Father        prostate   Other Mother        enlarged heart   Diabetes Mother    Hypertension Mother    Hyperlipidemia Mother    Heart disease Mother        heart murmer   Miscarriages / Stillbirths Mother    Breast cancer Mother    Other Brother        enlarged heart; colloid cyst of the third ventricle( of the brain)   Hypertension Sister    Colon cancer Maternal Aunt    Gastric cancer Maternal Uncle    Esophageal cancer Maternal Uncle 71   Stomach cancer Maternal Uncle     Medications:       Current Outpatient Medications:    cholecalciferol (VITAMIN D3) 25 MCG (1000 UT) tablet, Take 1,000 Units by mouth daily., Disp: , Rfl:    doxycycline (VIBRA-TABS) 100 MG tablet, Take 1 tablet (100 mg total) by mouth 2 (two) times daily., Disp: 20 tablet, Rfl: 0   EPINEPHrine 0.3 mg/0.3 mL IJ SOAJ injection, See admin instructions., Disp: , Rfl:    ibuprofen (ADVIL) 800 MG tablet, Take 800 mg by mouth 3 (three) times daily., Disp: , Rfl:    levonorgestrel (MIRENA) 20 MCG/24HR IUD, 1 each by Intrauterine route once., Disp: , Rfl:     nystatin-triamcinolone ointment (MYCOLOG), APPLY TO AFFECTED AREA TWICE DAILY, Disp: 30 g, Rfl: 11   pantoprazole (PROTONIX) 20 MG tablet, Take 20 mg by mouth 2 (  two) times daily., Disp: , Rfl:    pantoprazole (PROTONIX) 40 MG tablet, Take 40 mg by mouth 2 (two) times daily., Disp: , Rfl:    Plecanatide (TRULANCE) 3 MG TABS, Take 3 mg by mouth daily., Disp: 90 tablet, Rfl: 3   rizatriptan (MAXALT) 10 MG tablet, TAKE 1 TABLET BY MOUTH ONCE AT ONSET OF SYMPTOMS, MAY REPEAT DOSE IN 2 HOURS IF NEEDED., Disp: 9 tablet, Rfl: 0   silver sulfADIAZINE (SILVADENE) 1 % cream, Apply 3-4 times daily, Disp: 50 g, Rfl: 11   vitamin C (ASCORBIC ACID) 500 MG tablet, Take 500 mg by mouth daily., Disp: , Rfl:    Wheat Dextrin (BENEFIBER PO), Take 1 Scoop by mouth daily., Disp: , Rfl:    promethazine (PHENERGAN) 25 MG tablet, Take 1 tablet (25 mg total) by mouth every 6 (six) hours as needed for nausea or vomiting. (Patient not taking: Reported on 06/25/2021), Disp: 30 tablet, Rfl: 1  Objective Blood pressure 109/73, pulse 77, height 5' 4" (1.626 m), weight 272 lb 8 oz (123.6 kg).  Skin is peeling suggesting diminished localized swelling and induration process 2 cm open area with healthy appearing tissue On palpation the induration is at least 1/3 samller and less tender  Pertinent ROS No burning with urination, frequency or urgency No nausea, vomiting or diarrhea Nor fever chills or other constitutional symptoms   Labs or studies     Impression Diagnoses this Encounter::   ICD-10-CM   1. Boil  L02.92    Spontaneous drainage on antibiotics, add silvadene and continue oral doxycycline, reassess in 2 weeks      Established relevant diagnosis(es):   Plan/Recommendations: Meds ordered this encounter  Medications   silver sulfADIAZINE (SILVADENE) 1 % cream    Sig: Apply 3-4 times daily    Dispense:  50 g    Refill:  11    Labs or Scans Ordered: No orders of the defined types were placed in  this encounter.   Management:: No I&D needed today Re evaluate 2 weeks Add silvadene  Follow up No follow-ups on file.       All questions were answered.

## 2021-06-29 DIAGNOSIS — Z1231 Encounter for screening mammogram for malignant neoplasm of breast: Secondary | ICD-10-CM | POA: Diagnosis not present

## 2021-06-30 DIAGNOSIS — Z6841 Body Mass Index (BMI) 40.0 and over, adult: Secondary | ICD-10-CM | POA: Diagnosis not present

## 2021-06-30 DIAGNOSIS — U099 Post covid-19 condition, unspecified: Secondary | ICD-10-CM | POA: Diagnosis not present

## 2021-06-30 DIAGNOSIS — R0609 Other forms of dyspnea: Secondary | ICD-10-CM | POA: Diagnosis not present

## 2021-06-30 DIAGNOSIS — H938X2 Other specified disorders of left ear: Secondary | ICD-10-CM | POA: Diagnosis not present

## 2021-07-08 ENCOUNTER — Encounter: Payer: Self-pay | Admitting: Adult Health

## 2021-09-07 DIAGNOSIS — Z20822 Contact with and (suspected) exposure to covid-19: Secondary | ICD-10-CM | POA: Insufficient documentation

## 2021-09-08 DIAGNOSIS — R0602 Shortness of breath: Secondary | ICD-10-CM | POA: Diagnosis not present

## 2021-09-08 DIAGNOSIS — M7989 Other specified soft tissue disorders: Secondary | ICD-10-CM | POA: Diagnosis not present

## 2021-09-08 DIAGNOSIS — F331 Major depressive disorder, recurrent, moderate: Secondary | ICD-10-CM | POA: Diagnosis not present

## 2021-09-08 DIAGNOSIS — Z23 Encounter for immunization: Secondary | ICD-10-CM | POA: Diagnosis not present

## 2021-09-23 DIAGNOSIS — Z6841 Body Mass Index (BMI) 40.0 and over, adult: Secondary | ICD-10-CM | POA: Diagnosis not present

## 2021-10-08 ENCOUNTER — Ambulatory Visit: Payer: BLUE CROSS/BLUE SHIELD | Admitting: Adult Health

## 2021-10-22 ENCOUNTER — Ambulatory Visit: Payer: BLUE CROSS/BLUE SHIELD | Admitting: Gastroenterology

## 2021-10-26 ENCOUNTER — Encounter: Payer: Self-pay | Admitting: Gastroenterology

## 2021-10-26 ENCOUNTER — Ambulatory Visit (INDEPENDENT_AMBULATORY_CARE_PROVIDER_SITE_OTHER): Payer: BLUE CROSS/BLUE SHIELD | Admitting: Gastroenterology

## 2021-10-26 ENCOUNTER — Other Ambulatory Visit: Payer: Self-pay

## 2021-10-26 VITALS — BP 138/79 | HR 77 | Temp 97.5°F | Ht 64.0 in | Wt 270.2 lb

## 2021-10-26 DIAGNOSIS — K5900 Constipation, unspecified: Secondary | ICD-10-CM | POA: Diagnosis not present

## 2021-10-26 DIAGNOSIS — R160 Hepatomegaly, not elsewhere classified: Secondary | ICD-10-CM

## 2021-10-26 DIAGNOSIS — K219 Gastro-esophageal reflux disease without esophagitis: Secondary | ICD-10-CM

## 2021-10-26 LAB — HEPATIC FUNCTION PANEL
AG Ratio: 1.5 (calc) (ref 1.0–2.5)
ALT: 15 U/L (ref 6–29)
AST: 14 U/L (ref 10–30)
Albumin: 4.1 g/dL (ref 3.6–5.1)
Alkaline phosphatase (APISO): 102 U/L (ref 31–125)
Bilirubin, Direct: 0.1 mg/dL (ref 0.0–0.2)
Globulin: 2.7 g/dL (calc) (ref 1.9–3.7)
Indirect Bilirubin: 0.4 mg/dL (calc) (ref 0.2–1.2)
Total Bilirubin: 0.5 mg/dL (ref 0.2–1.2)
Total Protein: 6.8 g/dL (ref 6.1–8.1)

## 2021-10-26 NOTE — Patient Instructions (Signed)
Please have blood work done today.  I recommend starting Benefiber once daily. You can use this up to three times per day.  Continue Trulance as you are doing.  We will see you in 6 months!  It was a pleasure to see you today. I want to create trusting relationships with patients to provide genuine, compassionate, and quality care. I value your feedback. If you receive a survey regarding your visit,  I greatly appreciate you taking time to fill this out.   Gelene Mink, PhD, ANP-BC [facility]

## 2021-10-26 NOTE — Progress Notes (Signed)
Referring Provider: Quinn Plowman, MD Primary Care Physician:  Quinn Plowman, MD Primary GI: Dr. Gala Romney   Chief Complaint  Patient presents with   Abdominal Pain    Left abd. Bowels move better when takes Trulance    HPI:   Kristin [name] is a 41 y.o. female presenting today with a history of chronic left-sided abdominal pain (felt to be multifactorial in setting of IBS and functional abdominal pain), hepatomegaly, constipation, and GERD.   Hepatomegaly: labs with normal LFTs, Hep B and C negative, normal iron, autoimmune negative, AMA negative. Elevated CRP felt possibly due to obesity.   Trulance daily. When not taking Trulance, will have harder stool. Sometimes not as productive stool. Believes fiber may have helped in the past. LUQ pain improved if bowels regular.      Prior work-up:   CT A/P with contrast 01/2021: hepatomegaly with max span 23.6cm, IUD present in fundal endometrial cavity.    Abdominal f x-ray October 2021 with average stool burden, no acute findings.  Added Linzess 72 mcg daily for 2 weeks.   CT May 2020 demonstrated small left kidney stone, enlarged left ovarian vein felt to be nonspecific but could be seen in patients with pelvic congestion syndrome, but no other significant findings.   Pelvic ultrasound October 2020 for left lower quadrant pain showed clinically insignificant myoma, IUD in proper location, both ovaries normal.   EGD and colonoscopy in August 2020 showed hiatal hernia but otherwise unremarkable.  Past Medical History:  Diagnosis Date   Allergy    Anxiety    Atypical squamous cell changes of undetermined significance (ASCUS) on cervical cytology with negative high risk human papilloma virus (HPV) test result 03/30/2020   6/21 repeat in 3 years ASCCP guidelines 5 year risk CIN 3+ 0.27%   Carpal tunnel syndrome of right wrist 10/22/2018   Contraceptive management 11/05/2013   Depression    GERD (gastroesophageal reflux  disease)    IUD (intrauterine device) in place 01/13/2016   Migraines    Missed periods 01/13/2016   Vaginal Pap smear, abnormal     Past Surgical History:  Procedure Laterality Date   COLONOSCOPY N/A 05/10/2019   Normal. next colonoscopy 05/2029   ESOPHAGOGASTRODUODENOSCOPY N/A 05/10/2019   Hiatal hernia   NO PAST SURGERIES      Current Outpatient Medications  Medication Sig Dispense Refill   EPINEPHrine 0.3 mg/0.3 mL IJ SOAJ injection See admin instructions.     ibuprofen (ADVIL) 800 MG tablet Take 800 mg by mouth as needed.     levonorgestrel (MIRENA) 20 MCG/24HR IUD 1 each by Intrauterine route once.     pantoprazole (PROTONIX) 20 MG tablet Take 20 mg by mouth 2 (two) times daily.     Plecanatide (TRULANCE) 3 MG TABS Take 3 mg by mouth daily. 90 tablet 3   rizatriptan (MAXALT) 10 MG tablet TAKE 1 TABLET BY MOUTH ONCE AT ONSET OF SYMPTOMS, MAY REPEAT DOSE IN 2 HOURS IF NEEDED. 9 tablet 0   vitamin C (ASCORBIC ACID) 500 MG tablet Take 500 mg by mouth daily.     No current facility-administered medications for this visit.    Allergies as of 10/26/2021 - Review Complete 10/26/2021  Allergen Reaction Noted   Amoxicillin Swelling 08/04/2020   Penicillins Swelling 05/03/2020   Venlafaxine  08/24/2020    Family History  Problem Relation Age of Onset   Multiple myeloma Paternal Grandfather    Cancer Paternal Grandmother  leukemia   Colon cancer Maternal Grandmother    Hypertension Father    Hyperlipidemia Father    Cancer Father        prostate   Other Mother        enlarged heart   Diabetes Mother    Hypertension Mother    Hyperlipidemia Mother    Heart disease Mother        heart murmer   Miscarriages / Stillbirths Mother    Breast cancer Mother    Other Brother        enlarged heart; colloid cyst of the third ventricle( of the brain)   Hypertension Sister    Colon cancer Maternal Aunt    Gastric cancer Maternal Uncle    Esophageal cancer Maternal Uncle 71    Stomach cancer Maternal Uncle     Social History   Socioeconomic History   Marital status: Divorced    Spouse name: Not on file   Number of children: 3   Years of education: 14   Highest education level: Not on file  Occupational History   Occupation: Administrator    Comment: [employer]  Tobacco Use   Smoking status: Never   Smokeless tobacco: Never  Vaping Use   Vaping Use: Never used  Substance and Sexual Activity   Alcohol use: Yes    Comment: occasional   Drug use: No   Sexual activity: Yes    Birth control/protection: I.U.D.  Other Topics Concern   Not on file  Social History Narrative   Lives with parents   Looking for own place   Tries to walk daily   Social Determinants of Health   Financial Resource Strain: Medium Risk   Difficulty of Paying Living Expenses: Somewhat hard  Food Insecurity: Food Insecurity Present   Worried About Charity fundraiser in the Last Year: Sometimes true   Arboriculturist in the Last Year: Sometimes true  Transportation Needs: No Transportation Needs   Lack of Transportation (Medical): No   Lack of Transportation (Non-Medical): No  Physical Activity: Insufficiently Active   Days of Exercise per Week: 2 days   Minutes of Exercise per Session: 20 min  Stress: Stress Concern Present   Feeling of Stress : Rather much  Social Connections: Moderately Isolated   Frequency of Communication with Friends and Family: More than three times a week   Frequency of Social Gatherings with Friends and Family: Twice a week   Attends Religious Services: More than 4 times per year   Active Member of [employer] or Organizations: No   Attends Archivist Meetings: Never   Marital Status: Divorced    Review of Systems: Gen: Denies fever, chills, anorexia. Denies fatigue, weakness, weight loss.  CV: Denies chest pain, palpitations, syncope, peripheral edema, and claudication. Resp: Denies dyspnea at rest, cough, wheezing, coughing up  blood, and pleurisy. GI: see HPI Derm: Denies rash, itching, dry skin Psych: Denies depression, anxiety, memory loss, confusion. No homicidal or suicidal ideation.  Heme: Denies bruising, bleeding, and enlarged lymph nodes.  Physical Exam: BP 138/79    Pulse 77    Temp (!) 97.5 F (36.4 C) (Temporal)    Ht _0  (1.626 m)    Wt 270 lb 3.2 oz (122.6 kg)    BMI 46.38 kg/m  General:   Alert and oriented. No distress noted. Pleasant and cooperative.  Head:  Normocephalic and atraumatic. Eyes:  Conjuctiva clear without scleral icterus. Mouth:  mask in  place Abdomen:  +BS, soft, mild TTP LUQ and non-distended. No rebound or guarding. No HSM or masses noted. Msk:  Symmetrical without gross deformities. Normal posture. Extremities:  Without edema. Neurologic:  Alert and  oriented x4 Psych:  Alert and cooperative. Normal mood and affect.  ASSESSMENT/PLAN: Kristin [name] is a 41 y.o. female presenting today  with a history of chronic left-sided abdominal pain (felt to be multifactorial in setting of IBS and functional abdominal pain), hepatomegaly, constipation, and GERD.   Hepatomegaly: labs with normal LFTs, Hep B and C negative, normal iron, autoimmune negative, AMA negative. Elevated CRP felt possibly due to obesity. Recheck HFP now. Discussed weight loss and exercise, and diet. She is committing to working out in the morning before waking her children for school (ages 79 and 35).   Constipation: continue Trulance. Add fiber, as this has helped in the past.   Return in 6 months. HFP ordered.   Annitta Needs, PhD, ANP-BC [facility]

## 2021-11-03 ENCOUNTER — Other Ambulatory Visit: Payer: Self-pay

## 2021-11-03 DIAGNOSIS — R160 Hepatomegaly, not elsewhere classified: Secondary | ICD-10-CM

## 2021-11-25 ENCOUNTER — Other Ambulatory Visit: Payer: Self-pay | Admitting: Gastroenterology

## 2021-11-29 ENCOUNTER — Encounter: Payer: Self-pay | Admitting: Internal Medicine

## 2021-11-30 NOTE — Telephone Encounter (Signed)
Noted  

## 2021-12-02 ENCOUNTER — Ambulatory Visit: Payer: BLUE CROSS/BLUE SHIELD | Admitting: Adult Health

## 2021-12-07 ENCOUNTER — Ambulatory Visit: Payer: BLUE CROSS/BLUE SHIELD | Admitting: Adult Health

## 2021-12-17 DIAGNOSIS — Z6841 Body Mass Index (BMI) 40.0 and over, adult: Secondary | ICD-10-CM | POA: Diagnosis not present

## 2021-12-17 DIAGNOSIS — F32A Depression, unspecified: Secondary | ICD-10-CM | POA: Diagnosis not present

## 2021-12-17 DIAGNOSIS — M546 Pain in thoracic spine: Secondary | ICD-10-CM | POA: Diagnosis not present

## 2022-03-09 ENCOUNTER — Ambulatory Visit
Admission: RE | Admit: 2022-03-09 | Discharge: 2022-03-09 | Disposition: A | Discharge status: Home / Self Care | Payer: BLUE CROSS/BLUE SHIELD | Source: Ambulatory Visit | Attending: Nurse Practitioner | Admitting: Nurse Practitioner

## 2022-03-09 ENCOUNTER — Ambulatory Visit (INDEPENDENT_AMBULATORY_CARE_PROVIDER_SITE_OTHER): Payer: BLUE CROSS/BLUE SHIELD

## 2022-03-09 VITALS — BP 128/78 | HR 97 | Temp 98.2°F | Resp 18

## 2022-03-09 DIAGNOSIS — M79644 Pain in right finger(s): Secondary | ICD-10-CM

## 2022-03-09 DIAGNOSIS — S6991XA Unspecified injury of right wrist, hand and finger(s), initial encounter: Secondary | ICD-10-CM | POA: Diagnosis not present

## 2022-03-09 NOTE — ED Provider Notes (Signed)
[facility]    CSN: [id number] Arrival date & time: 03/09/22  1048      History   Chief Complaint Chief Complaint  Patient presents with   Finger Injury    Entered by patient    HPI Kristin [name] is a 41 y.o. female.   Patient presents for right middle finger pain and swelling since Sunday.  Reports she closed her finger in a car door.  She reports some swelling to the finger and a throbbing pain.  She reports the swelling has improved slightly since Sunday.  She is unable to tell if her nailbed has changed color since she has nail polish.  She denies fevers, nausea/vomiting since the injury.  She does not have decreased range of motion or numbness or tingling in her finger.   She has taken Tylenol and ibuprofen for the pain with some relief.    Past Medical History:  Diagnosis Date   Allergy    Anxiety    Atypical squamous cell changes of undetermined significance (ASCUS) on cervical cytology with negative high risk human papilloma virus (HPV) test result 03/30/2020   6/21 repeat in 3 years ASCCP guidelines 5 year risk CIN 3+ 0.27%   Carpal tunnel syndrome of right wrist 10/22/2018   Contraceptive management 11/05/2013   Depression    GERD (gastroesophageal reflux disease)    IUD (intrauterine device) in place 01/13/2016   Migraines    Missed periods 01/13/2016   Vaginal Pap smear, abnormal     Patient Active Problem List   Diagnosis Date Noted   Boil 06/22/2021   Screening for diabetes mellitus 06/22/2021   Elevated C-reactive protein (CRP) 04/19/2021   Hepatomegaly 04/19/2021   Constipation 04/19/2021   Hemorrhoids 03/25/2021   Encounter for screening fecal occult blood testing 03/25/2021   History of abnormal cervical Pap smear 03/25/2021   LLQ pain 11/10/2020   LUQ pain 11/10/2020   Atypical squamous cell changes of undetermined significance (ASCUS) on cervical cytology with negative high risk human papilloma virus (HPV) test result 03/30/2020    Vulvar itching 03/25/2020   Abdominal pain 01/21/2020   Shakiness 01/21/2020   Left flank pain 11/13/2019   Bloating 11/13/2019   Urinary frequency 11/13/2019   Loss of weight 06/12/2019   Boil of groin 05/03/2019   Lower abdominal pain 04/19/2019   Pain with urination 04/19/2019   GERD (gastroesophageal reflux disease) 03/18/2019   Change in stool habits 03/18/2019   Abnormal CT of the abdomen 03/18/2019   Boil, leg 03/04/2019   Screening for colorectal cancer 10/22/2018   Screening for STDs (sexually transmitted diseases) 10/22/2018   Encounter for gynecological examination with Papanicolaou smear of cervix 10/22/2018   Family planning 10/22/2018   Plantar fasciitis, right 10/22/2018   Carpal tunnel syndrome of right wrist 10/22/2018   IUD (intrauterine device) in place 01/13/2016   Migraines 03/07/2013    Past Surgical History:  Procedure Laterality Date   COLONOSCOPY N/A 05/10/2019   Normal. next colonoscopy 05/2029   ESOPHAGOGASTRODUODENOSCOPY N/A 05/10/2019   Hiatal hernia   NO PAST SURGERIES      OB History     Gravida  3   Para  3   Term  3   Preterm      AB      Living  3      SAB      IAB      Ectopic      Multiple  0   Live Births  3            Home Medications    Prior to Admission medications   Medication Sig Start Date End Date Taking? Authorizing Provider  EPINEPHrine 0.3 mg/0.3 mL IJ SOAJ injection See admin instructions. 08/24/20   [provider]  ibuprofen (ADVIL) 800 MG tablet Take 800 mg by mouth as needed. 11/23/20   [provider]  levonorgestrel (MIRENA) 20 MCG/24HR IUD 1 each by Intrauterine route once.    [provider]  pantoprazole (PROTONIX) 20 MG tablet Take 20 mg by mouth 2 (two) times daily.    [provider]  pantoprazole (PROTONIX) 40 MG tablet TAKE 1 TABLET BY MOUTH TWICE DAILY BEFORE A MEAL. 11/30/21   Annitta Needs, NP  Plecanatide (TRULANCE) 3 MG TABS Take 3 mg by mouth  daily. 04/19/21   Mahala Menghini, PA-C  rizatriptan (MAXALT) 10 MG tablet TAKE 1 TABLET BY MOUTH ONCE AT ONSET OF SYMPTOMS, MAY REPEAT DOSE IN 2 HOURS IF NEEDED. 01/15/18   Raylene Everts, MD  vitamin C (ASCORBIC ACID) 500 MG tablet Take 500 mg by mouth daily.    [provider]    Family History Family History  Problem Relation Age of Onset   Multiple myeloma Paternal Grandfather    Cancer Paternal Grandmother        leukemia   Colon cancer Maternal Grandmother    Hypertension Father    Hyperlipidemia Father    Cancer Father        prostate   Other Mother        enlarged heart   Diabetes Mother    Hypertension Mother    Hyperlipidemia Mother    Heart disease Mother        heart murmer   Miscarriages / Stillbirths Mother    Breast cancer Mother    Other Brother        enlarged heart; colloid cyst of the third ventricle( of the brain)   Hypertension Sister    Colon cancer Maternal Aunt    Gastric cancer Maternal Uncle    Esophageal cancer Maternal Uncle 71   Stomach cancer Maternal Uncle     Social History Social History   Tobacco Use   Smoking status: Never   Smokeless tobacco: Never  Vaping Use   Vaping Use: Never used  Substance Use Topics   Alcohol use: Yes    Comment: occasional   Drug use: No     Allergies   Amoxicillin, Penicillins, and Venlafaxine   Review of Systems Review of Systems Per HPI  Physical Exam Triage Vital Signs ED Triage Vitals  Enc Vitals Group     BP 03/09/22 1100 128/78     Pulse Rate 03/09/22 1100 97     Resp 03/09/22 1100 18     Temp 03/09/22 1100 98.2 F (36.8 C)     Temp Source 03/09/22 1100 Oral     SpO2 03/09/22 1100 99 %     Weight --      Height --      Head Circumference --      Peak Flow --      Pain Score 03/09/22 1059 6     Pain Loc --      Pain Edu? --      Excl. in [place]? --    No data found.  Updated Vital Signs BP 128/78 (BP Location: Right Arm)   Pulse 97   Temp 98.2 F (36.8 C)  (  Oral)   Resp 18   SpO2 99%   Visual Acuity Right Eye Distance:   Left Eye Distance:   Bilateral Distance:    Right Eye Near:   Left Eye Near:    Bilateral Near:     Physical Exam Vitals and nursing note reviewed.  Constitutional:      General: She is not in acute distress.    Appearance: Normal appearance. She is not toxic-appearing.  Pulmonary:     Effort: Pulmonary effort is normal. No respiratory distress.  Musculoskeletal:     Right hand: Bony tenderness (distal phalax of right third digit) present. No swelling, deformity or tenderness. Normal range of motion. Normal strength. Normal sensation. There is no disruption of two-point discrimination. Normal capillary refill. Normal pulse.     Left hand: Normal.     Comments: There is mild swelling around the distal phalanx; purple discoloration under the distal nail bed.   Skin:    General: Skin is warm and dry.     Capillary Refill: Capillary refill takes less than 2 seconds.     Coloration: Skin is not jaundiced or pale.     Findings: No erythema.  Neurological:     Mental Status: She is alert and oriented to person, place, and time.  Psychiatric:        Behavior: Behavior is cooperative.     UC Treatments / Results  Labs (all labs ordered are listed, but only abnormal results are displayed) Labs Reviewed - No data to display  EKG   Radiology DG Finger Middle Right  Result Date: 03/09/2022 CLINICAL DATA:  Right long finger pain, injury EXAM: RIGHT MIDDLE FINGER 2+V COMPARISON:  None Available. FINDINGS: There is no evidence of fracture or dislocation. There is no evidence of arthropathy or other focal bone abnormality. Soft tissues are unremarkable. IMPRESSION: Negative. Electronically Signed   By: Davina Poke D.O.   On: 03/09/2022 11:20    Procedures Procedures (including critical care time)  Medications Ordered in UC Medications - No data to display  Initial Impression / Assessment and Plan / UC Course   I have reviewed the triage vital signs and the nursing notes.  Pertinent labs & imaging results that were available during my care of the patient were reviewed by me and considered in my medical decision making (see chart for details).    Finger x-ray today does not show any acute bony abnormality including fracture.  Discussed supportive care with the patient.  Will place patient in a finger splint for support/pain.  Encouraged alternating Tylenol and ibuprofen for pain.  Encouraged warm soaks, ice as needed for pain and swelling.  Note give for work.  Seek care if symptoms persist or worsen despite treatment.  Final Clinical Impressions(s) / UC Diagnoses   Final diagnoses:  Finger pain, right     Discharge Instructions      - The x-ray today does not show that your finger is broken which is great - Alternate Tylenol 500 mg every 6 hours and ibuprofen 800 mg every 8 hours as needed for pain - You can also try warm soaks at home - We have placed your finger in a splint for comfort - Please seek care if your symptoms persist or worsen despite treatment    ED Prescriptions   None    PDMP not reviewed this encounter.   Eulogio Bear, NP 03/09/22 [phone]

## 2022-03-09 NOTE — Discharge Instructions (Signed)
-   The x-ray today does not show that your finger is broken which is great - Alternate Tylenol 500 mg every 6 hours and ibuprofen 800 mg every 8 hours as needed for pain - You can also try warm soaks at home - We have placed your finger in a splint for comfort - Please seek care if your symptoms persist or worsen despite treatment

## 2022-03-09 NOTE — ED Triage Notes (Signed)
Pt states she hit her middle finger on her right hand washing dishes the 1st time and she slammed in car door Sunday  Pt states she took some Tylenol Arthritis for pain and it did help  Pt states it hurts to straighten out her finger

## 2022-03-18 ENCOUNTER — Encounter: Payer: Self-pay | Admitting: Emergency Medicine

## 2022-03-18 ENCOUNTER — Ambulatory Visit
Admission: EM | Admit: 2022-03-18 | Discharge: 2022-03-18 | Disposition: A | Discharge status: Home / Self Care | Payer: BLUE CROSS/BLUE SHIELD | Attending: Family Medicine | Admitting: Family Medicine

## 2022-03-18 DIAGNOSIS — S60031A Contusion of right middle finger without damage to nail, initial encounter: Secondary | ICD-10-CM

## 2022-03-18 NOTE — ED Triage Notes (Signed)
Hit finger a week ago and had x-ray.  Not tip of finger is changing colors and states it throbs.

## 2022-03-18 NOTE — ED Provider Notes (Signed)
[facility]    CSN: [id number] Arrival date & time: 03/18/22  1245      History   Chief Complaint No chief complaint on file.   HPI Kristin [name] is a 41 y.o. female.   Presenting today following up on right middle fingertip injury from slamming into a car door last week.  Had x-ray that showed no acute bony abnormality, was placed in a finger splint for comfort and discussed rest protocol, over-the-counter pain relievers.  She did have a fake nail on at this time but has taken that off and notes bruising and discoloration to nail bed and distal finger.  Overall improving but still having throbbing pain especially with letting the arm dangle at rest by her side.  Denies numbness, tingling, loss of range of motion, ongoing swelling.  Taking Tylenol with minimal relief.    Past Medical History:  Diagnosis Date   Allergy    Anxiety    Atypical squamous cell changes of undetermined significance (ASCUS) on cervical cytology with negative high risk human papilloma virus (HPV) test result 03/30/2020   6/21 repeat in 3 years ASCCP guidelines 5 year risk CIN 3+ 0.27%   Carpal tunnel syndrome of right wrist 10/22/2018   Contraceptive management 11/05/2013   Depression    GERD (gastroesophageal reflux disease)    IUD (intrauterine device) in place 01/13/2016   Migraines    Missed periods 01/13/2016   Vaginal Pap smear, abnormal     Patient Active Problem List   Diagnosis Date Noted   Boil 06/22/2021   Screening for diabetes mellitus 06/22/2021   Elevated C-reactive protein (CRP) 04/19/2021   Hepatomegaly 04/19/2021   Constipation 04/19/2021   Hemorrhoids 03/25/2021   Encounter for screening fecal occult blood testing 03/25/2021   History of abnormal cervical Pap smear 03/25/2021   LLQ pain 11/10/2020   LUQ pain 11/10/2020   Atypical squamous cell changes of undetermined significance (ASCUS) on cervical cytology with negative high risk human papilloma virus (HPV) test  result 03/30/2020   Vulvar itching 03/25/2020   Abdominal pain 01/21/2020   Shakiness 01/21/2020   Left flank pain 11/13/2019   Bloating 11/13/2019   Urinary frequency 11/13/2019   Loss of weight 06/12/2019   Boil of groin 05/03/2019   Lower abdominal pain 04/19/2019   Pain with urination 04/19/2019   GERD (gastroesophageal reflux disease) 03/18/2019   Change in stool habits 03/18/2019   Abnormal CT of the abdomen 03/18/2019   Boil, leg 03/04/2019   Screening for colorectal cancer 10/22/2018   Screening for STDs (sexually transmitted diseases) 10/22/2018   Encounter for gynecological examination with Papanicolaou smear of cervix 10/22/2018   Family planning 10/22/2018   Plantar fasciitis, right 10/22/2018   Carpal tunnel syndrome of right wrist 10/22/2018   IUD (intrauterine device) in place 01/13/2016   Migraines 03/07/2013    Past Surgical History:  Procedure Laterality Date   COLONOSCOPY N/A 05/10/2019   Normal. next colonoscopy 05/2029   ESOPHAGOGASTRODUODENOSCOPY N/A 05/10/2019   Hiatal hernia   NO PAST SURGERIES      OB History     Gravida  3   Para  3   Term  3   Preterm      AB      Living  3      SAB      IAB      Ectopic      Multiple  0   Live Births  3  Home Medications    Prior to Admission medications   Medication Sig Start Date End Date Taking? Authorizing Provider  EPINEPHrine 0.3 mg/0.3 mL IJ SOAJ injection See admin instructions. 08/24/20   [provider]  ibuprofen (ADVIL) 800 MG tablet Take 800 mg by mouth as needed. 11/23/20   [provider]  levonorgestrel (MIRENA) 20 MCG/24HR IUD 1 each by Intrauterine route once.    [provider]  pantoprazole (PROTONIX) 20 MG tablet Take 20 mg by mouth 2 (two) times daily.    [provider]  pantoprazole (PROTONIX) 40 MG tablet TAKE 1 TABLET BY MOUTH TWICE DAILY BEFORE A MEAL. 11/30/21   Annitta Needs, NP  Plecanatide (TRULANCE) 3 MG TABS  Take 3 mg by mouth daily. 04/19/21   Mahala Menghini, PA-C  rizatriptan (MAXALT) 10 MG tablet TAKE 1 TABLET BY MOUTH ONCE AT ONSET OF SYMPTOMS, MAY REPEAT DOSE IN 2 HOURS IF NEEDED. 01/15/18   Raylene Everts, MD  vitamin C (ASCORBIC ACID) 500 MG tablet Take 500 mg by mouth daily.    [provider]    Family History Family History  Problem Relation Age of Onset   Multiple myeloma Paternal Grandfather    Cancer Paternal Grandmother        leukemia   Colon cancer Maternal Grandmother    Hypertension Father    Hyperlipidemia Father    Cancer Father        prostate   Other Mother        enlarged heart   Diabetes Mother    Hypertension Mother    Hyperlipidemia Mother    Heart disease Mother        heart murmer   Miscarriages / Stillbirths Mother    Breast cancer Mother    Other Brother        enlarged heart; colloid cyst of the third ventricle( of the brain)   Hypertension Sister    Colon cancer Maternal Aunt    Gastric cancer Maternal Uncle    Esophageal cancer Maternal Uncle 71   Stomach cancer Maternal Uncle     Social History Social History   Tobacco Use   Smoking status: Never   Smokeless tobacco: Never  Vaping Use   Vaping Use: Never used  Substance Use Topics   Alcohol use: Yes    Comment: occasional   Drug use: No     Allergies   Amoxicillin, Penicillins, and Venlafaxine   Review of Systems Review of Systems Per HPI  Physical Exam Triage Vital Signs ED Triage Vitals  Enc Vitals Group     BP 03/18/22 1346 133/88     Pulse Rate 03/18/22 1346 62     Resp 03/18/22 1346 18     Temp 03/18/22 1346 98 F (36.7 C)     Temp Source 03/18/22 1346 Oral     SpO2 03/18/22 1346 98 %     Weight --      Height --      Head Circumference --      Peak Flow --      Pain Score 03/18/22 1347 6     Pain Loc --      Pain Edu? --      Excl. in [place]? --    No data found.  Updated Vital Signs BP 133/88 (BP Location: Right Arm)   Pulse 62   Temp 98 F  (36.7 C) (Oral)   Resp 18   SpO2 98%  Visual Acuity Right Eye Distance:   Left Eye Distance:   Bilateral Distance:    Right Eye Near:   Left Eye Near:    Bilateral Near:     Physical Exam Vitals and nursing note reviewed.  Constitutional:      Appearance: Normal appearance. She is not ill-appearing.  HENT:     Head: Atraumatic.  Eyes:     Extraocular Movements: Extraocular movements intact.     Conjunctiva/sclera: Conjunctivae normal.  Cardiovascular:     Rate and Rhythm: Normal rate and regular rhythm.     Heart sounds: Normal heart sounds.  Pulmonary:     Effort: Pulmonary effort is normal.     Breath sounds: Normal breath sounds.  Musculoskeletal:        General: Tenderness and signs of injury present. No swelling. Normal range of motion.     Cervical back: Normal range of motion and neck supple.     Comments: Range of motion intact, no bony deformity palpable, no damage to the nail  Skin:    General: Skin is warm and dry.     Findings: Bruising present.     Comments: Subungual contusion, distal fingertip slightly bruised right middle finger  Neurological:     Mental Status: She is alert and oriented to person, place, and time.     Comments: Right hand neurovascularly intact  Psychiatric:        Mood and Affect: Mood normal.        Thought Content: Thought content normal.        Judgment: Judgment normal.      UC Treatments / Results  Labs (all labs ordered are listed, but only abnormal results are displayed) Labs Reviewed - No data to display  EKG   Radiology No results found.  Procedures Procedures (including critical care time)  Medications Ordered in UC Medications - No data to display  Initial Impression / Assessment and Plan / UC Course  I have reviewed the triage vital signs and the nursing notes.  Pertinent labs & imaging results that were available during my care of the patient were reviewed by me and considered in my medical decision  making (see chart for details).     Healing contusion from injury last week.  No concerning findings today, discussed RICE protocol, Epsom salt soaks, over-the-counter pain relievers.  Dressing applied prior to discharge for comfort  Final Clinical Impressions(s) / UC Diagnoses   Final diagnoses:  Contusion of right middle finger without damage to nail, initial encounter   Discharge Instructions   None    ED Prescriptions   None    PDMP not reviewed this encounter.   Volney American, [place] 03/18/22 1445

## 2022-04-07 ENCOUNTER — Encounter: Payer: Self-pay | Admitting: Internal Medicine

## 2022-04-18 ENCOUNTER — Other Ambulatory Visit: Payer: Self-pay

## 2022-04-18 ENCOUNTER — Telehealth: Payer: Self-pay

## 2022-04-18 DIAGNOSIS — R160 Hepatomegaly, not elsewhere classified: Secondary | ICD-10-CM

## 2022-04-18 NOTE — Telephone Encounter (Signed)
Lab forms mailed to the pt

## 2022-04-27 DIAGNOSIS — R202 Paresthesia of skin: Secondary | ICD-10-CM | POA: Diagnosis not present

## 2022-04-27 DIAGNOSIS — R200 Anesthesia of skin: Secondary | ICD-10-CM | POA: Diagnosis not present

## 2022-04-27 DIAGNOSIS — G43719 Chronic migraine without aura, intractable, without status migrainosus: Secondary | ICD-10-CM | POA: Diagnosis not present

## 2022-04-27 DIAGNOSIS — R42 Dizziness and giddiness: Secondary | ICD-10-CM | POA: Diagnosis not present

## 2022-04-27 DIAGNOSIS — Z6841 Body Mass Index (BMI) 40.0 and over, adult: Secondary | ICD-10-CM | POA: Diagnosis not present

## 2022-04-27 DIAGNOSIS — R5383 Other fatigue: Secondary | ICD-10-CM | POA: Diagnosis not present

## 2022-04-27 DIAGNOSIS — G44221 Chronic tension-type headache, intractable: Secondary | ICD-10-CM | POA: Diagnosis not present

## 2022-04-29 ENCOUNTER — Telehealth: Payer: Self-pay | Admitting: Internal Medicine

## 2022-04-29 NOTE — Telephone Encounter (Signed)
Please call patient about her labwork, her neurologist drew her labs and she wanted to know if our office could see it   she also had a question.

## 2022-05-02 NOTE — Telephone Encounter (Signed)
Kristin [name], Pt called again today and I advised her that you would be in tomorrow but she was wanting to ask [place] would these elevated levels in her bloodwork consign with arthritis. I asked the pt did we order it but she said another Dr did but really didn't explain it to her that's why she phoned here. I advised the pt to call the Dr back and I would still put on your desk but technically we are GI Dr's and it would be better if they advise the pt on this more. I did tell the pt I would make you aware. Pt expressed understanding.

## 2022-05-03 NOTE — Telephone Encounter (Signed)
She has elevated inflammatory markers. She needs to talk with neurologist that ordered this. She does not have any concerning GI symptoms that would make me concerned about this being a GI issue.

## 2022-05-04 NOTE — Telephone Encounter (Signed)
Phoned and advised the pt of note. Pt expressed understanding.

## 2022-05-11 DIAGNOSIS — R700 Elevated erythrocyte sedimentation rate: Secondary | ICD-10-CM | POA: Diagnosis not present

## 2022-05-11 DIAGNOSIS — G44221 Chronic tension-type headache, intractable: Secondary | ICD-10-CM | POA: Diagnosis not present

## 2022-05-24 ENCOUNTER — Ambulatory Visit: Payer: BLUE CROSS/BLUE SHIELD | Admitting: Internal Medicine

## 2022-05-31 ENCOUNTER — Ambulatory Visit (INDEPENDENT_AMBULATORY_CARE_PROVIDER_SITE_OTHER): Payer: BC Managed Care – PPO | Admitting: Internal Medicine

## 2022-05-31 ENCOUNTER — Encounter: Payer: Self-pay | Admitting: Internal Medicine

## 2022-05-31 VITALS — BP 118/78 | HR 70 | Temp 97.5°F | Ht 64.0 in | Wt 276.6 lb

## 2022-05-31 DIAGNOSIS — K219 Gastro-esophageal reflux disease without esophagitis: Secondary | ICD-10-CM

## 2022-05-31 DIAGNOSIS — K5900 Constipation, unspecified: Secondary | ICD-10-CM | POA: Diagnosis not present

## 2022-05-31 DIAGNOSIS — R160 Hepatomegaly, not elsewhere classified: Secondary | ICD-10-CM

## 2022-05-31 NOTE — Patient Instructions (Signed)
It was good to see you again today!  I recommend you continue taking pantoprazole or Protonix 40 milligrams twice daily 30 minutes before meals.  May use over-the-counter Pepcid Complete 1 to 2 tablets as needed in case of a flare in acid reflux  Resume Trulance 3 mg daily to manage constipation (dispense 90 with 3 refills)  Slow steady weight loss would be of great benefit to your health overall.  Repeat hepatic function profile in 5 months  Office visit with [place] in 6 months  If you have any interim problems, please let us know.

## 2022-05-31 NOTE — Progress Notes (Signed)
Primary Care Physician:  Quinn Plowman, MD Primary Gastroenterologist:  Dr.   Pre-Procedure History & Physical: HPI:  Kristin [name] is a 41 y.o. female here for follow-up.  Has been doing very well on Trulance  -  ran out recently.  She says pharmacy called and requested refills x2 and there was no reportedly response.  We have no record.  When she was on Trulance, she was doing very well.  Has not had any rectal bleeding; reflux fairly well controlled most the time on pantoprazole 40 mg before meals twice daily.  She does have occasional reflux symptoms when she eats spicy foods.  No dysphagia. She has an enlarged liver however, her LFTs have been repeatedly normal and serological work-up has not indicated any evidence of ongoing liver disease.  Past Medical History:  Diagnosis Date   Allergy    Anxiety    Atypical squamous cell changes of undetermined significance (ASCUS) on cervical cytology with negative high risk human papilloma virus (HPV) test result 03/30/2020   6/21 repeat in 3 years ASCCP guidelines 5 year risk CIN 3+ 0.27%   Carpal tunnel syndrome of right wrist 10/22/2018   Contraceptive management 11/05/2013   Depression    GERD (gastroesophageal reflux disease)    IUD (intrauterine device) in place 01/13/2016   Migraines    Missed periods 01/13/2016   Vaginal Pap smear, abnormal     Past Surgical History:  Procedure Laterality Date   COLONOSCOPY N/A 05/10/2019   Normal. next colonoscopy 05/2029   ESOPHAGOGASTRODUODENOSCOPY N/A 05/10/2019   Hiatal hernia   NO PAST SURGERIES      Prior to Admission medications   Medication Sig Start Date End Date Taking? Authorizing Provider  EPINEPHrine 0.3 mg/0.3 mL IJ SOAJ injection See admin instructions. 08/24/20  Yes [provider]  ibuprofen (ADVIL) 800 MG tablet Take 800 mg by mouth as needed. 11/23/20  Yes [provider]  levonorgestrel (MIRENA) 20 MCG/24HR IUD 1 each by Intrauterine route once.   Yes  [provider]  pantoprazole (PROTONIX) 40 MG tablet TAKE 1 TABLET BY MOUTH TWICE DAILY BEFORE A MEAL. 11/30/21  Yes Annitta Needs, NP  Plecanatide (TRULANCE) 3 MG TABS Take 3 mg by mouth daily. 04/19/21  Yes Mahala Menghini, PA-C  rizatriptan (MAXALT) 10 MG tablet TAKE 1 TABLET BY MOUTH ONCE AT ONSET OF SYMPTOMS, MAY REPEAT DOSE IN 2 HOURS IF NEEDED. 01/15/18  Yes Raylene Everts, MD  vitamin C (ASCORBIC ACID) 500 MG tablet Take 500 mg by mouth daily.   Yes [provider]    Allergies as of 05/31/2022 - Review Complete 05/31/2022  Allergen Reaction Noted   Amoxicillin Swelling 08/04/2020   Penicillins Swelling 05/03/2020   Venlafaxine  08/24/2020    Family History  Problem Relation Age of Onset   Multiple myeloma Paternal Grandfather    Cancer Paternal Grandmother        leukemia   Colon cancer Maternal Grandmother    Hypertension Father    Hyperlipidemia Father    Cancer Father        prostate   Other Mother        enlarged heart   Diabetes Mother    Hypertension Mother    Hyperlipidemia Mother    Heart disease Mother        heart murmer   Miscarriages / Stillbirths Mother    Breast cancer Mother    Other Brother  enlarged heart; colloid cyst of the third ventricle( of the brain)   Hypertension Sister    Colon cancer Maternal Aunt    Gastric cancer Maternal Uncle    Esophageal cancer Maternal Uncle 71   Stomach cancer Maternal Uncle     Social History   Socioeconomic History   Marital status: Divorced    Spouse name: Not on file   Number of children: 3   Years of education: 14   Highest education level: Not on file  Occupational History   Occupation: Administrator    Comment: [employer]  Tobacco Use   Smoking status: Never   Smokeless tobacco: Never  Vaping Use   Vaping Use: Never used  Substance and Sexual Activity   Alcohol use: Yes    Comment: occasional   Drug use: No   Sexual activity: Yes    Birth  control/protection: I.U.D.  Other Topics Concern   Not on file  Social History Narrative   Lives with parents   Looking for own place   Tries to walk daily   Social Determinants of Health   Financial Resource Strain: Medium Risk (03/25/2021)   Overall Financial Resource Strain (CARDIA)    Difficulty of Paying Living Expenses: Somewhat hard  Food Insecurity: Food Insecurity Present (03/25/2021)   Hunger Vital Sign    Worried About Running Out of Food in the Last Year: Sometimes true    Ran Out of Food in the Last Year: Sometimes true  Transportation Needs: No Transportation Needs (03/25/2021)   PRAPARE - Hydrologist (Medical): No    Lack of Transportation (Non-Medical): No  Physical Activity: Insufficiently Active (03/25/2021)   Exercise Vital Sign    Days of Exercise per Week: 2 days    Minutes of Exercise per Session: 20 min  Stress: Stress Concern Present (03/25/2021)   [place]    Feeling of Stress : Rather much  Social Connections: Moderately Isolated (03/25/2021)   Social Connection and Isolation Panel [NHANES]    Frequency of Communication with Friends and Family: More than three times a week    Frequency of Social Gatherings with Friends and Family: Twice a week    Attends Religious Services: More than 4 times per year    Active Member of [employer] or Organizations: No    Attends Archivist Meetings: Never    Marital Status: Divorced  Human resources officer Violence: Not At Risk (03/25/2021)   Humiliation, Afraid, Rape, and Kick questionnaire    Fear of Current or Ex-Partner: No    Emotionally Abused: No    Physically Abused: No    Sexually Abused: No    Review of Systems: See HPI, otherwise negative ROS  Physical Exam: BP 118/78 (BP Location: Right Arm, Patient Position: Sitting, Cuff Size: Large)   Pulse 70   Temp (!) 97.5 F (36.4 C) (Temporal)   Ht 5' 4" (1.626 m)    Wt 276 lb 9.6 oz (125.5 kg)   LMP  (LMP Unknown)   SpO2 100%   BMI 47.48 kg/m  General:   Alert,  Well-developed, well-nourished, pleasant and cooperative in NAD Neck:  Supple; no masses or thyromegaly. No significant cervical adenopathy. Lungs:  Clear throughout to auscultation.   No wheezes, crackles, or rhonchi. No acute distress. Heart:  Regular rate and rhythm; no murmurs, clicks, rubs,  or gallops. Abdomen: Non-distended, normal bowel sounds.  Soft and nontender without appreciable mass  or hepatosplenomegaly.  Pulses:  Normal pulses noted. Extremities:  Without clubbing or edema.  Impression/Plan: Pleasant 41 year old obese lady with GERD well-controlled on twice daily PPI therapy, constipation responsive to Trulance.  No alarm symptoms at this time.  She does have a large liver.  However, doubt this is indicative of a pathological process.  Repeatedly normal LFTs.  Nothing concerning otherwise on imaging.  Recommendations:  I recommend you continue taking pantoprazole or Protonix 40 milligrams twice daily 30 minutes before meals.  May use over-the-counter Pepcid Complete 1 to 2 tablets as needed in case of a flare in acid reflux  Resume Trulance 3 mg daily to manage constipation (dispense 90 with 3 refills)  Slow steady weight loss would be of great benefit to your health overall.  Repeat hepatic function profile in 5 months  Office visit with [place] in 6 months  If you have any interim problems, please let us know.     Notice: This dictation was prepared with Dragon dictation along with smaller phrase technology. Any transcriptional errors that result from this process are unintentional and may not be corrected upon review.

## 2022-06-01 ENCOUNTER — Other Ambulatory Visit: Payer: Self-pay

## 2022-06-01 DIAGNOSIS — R7989 Other specified abnormal findings of blood chemistry: Secondary | ICD-10-CM

## 2022-06-01 DIAGNOSIS — R160 Hepatomegaly, not elsewhere classified: Secondary | ICD-10-CM

## 2022-06-01 MED ORDER — TRULANCE 3 MG PO TABS: 3.0000 mg | ORAL_TABLET | Freq: Every day | ORAL | 3 refills | Status: DC

## 2022-06-02 ENCOUNTER — Encounter: Payer: Self-pay | Admitting: Internal Medicine

## 2022-07-05 ENCOUNTER — Ambulatory Visit
Admission: RE | Admit: 2022-07-05 | Discharge: 2022-07-05 | Disposition: A | Discharge status: Home / Self Care | Payer: BC Managed Care – PPO | Source: Ambulatory Visit | Attending: Nurse Practitioner | Admitting: Nurse Practitioner

## 2022-07-05 VITALS — BP 144/94 | HR 88 | Temp 98.9°F | Resp 18

## 2022-07-05 DIAGNOSIS — J069 Acute upper respiratory infection, unspecified: Secondary | ICD-10-CM | POA: Insufficient documentation

## 2022-07-05 DIAGNOSIS — Z1152 Encounter for screening for COVID-19: Secondary | ICD-10-CM | POA: Insufficient documentation

## 2022-07-05 MED ORDER — BENZONATATE 100 MG PO CAPS: 100.0000 mg | ORAL_CAPSULE | Freq: Three times a day (TID) | ORAL | 0 refills | Status: DC | PRN

## 2022-07-05 MED ORDER — PROMETHAZINE-DM 6.25-15 MG/5ML PO SYRP: 5.0000 mL | ORAL_SOLUTION | Freq: Every evening | ORAL | 0 refills | Status: DC | PRN

## 2022-07-05 NOTE — Discharge Instructions (Signed)
You have a viral upper respiratory infection.  This usually improves in about a week to 10 days.  A cough may last for a few weeks after a viral infection.  If your symptoms have not improved in about a week or if they worsen, please return to be seen.  If you develop severe chest pain or shortness of breath, go to the emergency room.    We have tested you today for COVID-19.  You will see the results in Mychart and we will call you with positive results.    Please stay home and isolate until you are aware of the results.    Some things that can make you feel better are: - Increased rest - Increasing fluid with water/sugar free electrolytes - Acetaminophen and ibuprofen as needed for fever/pain.  - Salt water gargling, chloraseptic spray and throat lozenges - OTC guaifenesin (Mucinex).  - Saline sinus flushes or a neti pot.  - Humidifying the air. -Cough Perles every 8 hours as needed during the day as well as cough syrup at nighttime as needed for dry cough.

## 2022-07-05 NOTE — ED Triage Notes (Signed)
Pt reports cough, wheezing and chest congestion, left sided back pain x 3 days. Mucinex and Zyrtec gives some relief.

## 2022-07-05 NOTE — ED Provider Notes (Signed)
[facility]    CSN: [id number] Arrival date & time: 07/05/22  6015      History   Chief Complaint Chief Complaint  Patient presents with   Appointment    0930 Cough with some wheezing and chest congestion. Back hurts when I lay on my left side - Entered by patient   Cough    HPI Kristin [name] is a 41 y.o. female.   Patient presents for 3 days of left upper back pain, body aches, productive cough, chest congestion, runny nose, headache, left ear pain, nausea without vomiting, decreased appetite, and fatigue.  She denies fever, shortness of breath, chest pain, nasal congestion, sore throat, sneezing, abdominal pain, diarrhea, and loss of taste or smell.  No new rash.  Has been taking ibuprofen, guaifenesin, and cetirizine for symptoms with minimal relief.  No known sick contacts.    Past Medical History:  Diagnosis Date   Allergy    Anxiety    Atypical squamous cell changes of undetermined significance (ASCUS) on cervical cytology with negative high risk human papilloma virus (HPV) test result 03/30/2020   6/21 repeat in 3 years ASCCP guidelines 5 year risk CIN 3+ 0.27%   Carpal tunnel syndrome of right wrist 10/22/2018   Contraceptive management 11/05/2013   Depression    GERD (gastroesophageal reflux disease)    IUD (intrauterine device) in place 01/13/2016   Migraines    Missed periods 01/13/2016   Vaginal Pap smear, abnormal     Patient Active Problem List   Diagnosis Date Noted   Boil 06/22/2021   Screening for diabetes mellitus 06/22/2021   Elevated C-reactive protein (CRP) 04/19/2021   Hepatomegaly 04/19/2021   Constipation 04/19/2021   Hemorrhoids 03/25/2021   Encounter for screening fecal occult blood testing 03/25/2021   History of abnormal cervical Pap smear 03/25/2021   LLQ pain 11/10/2020   LUQ pain 11/10/2020   Atypical squamous cell changes of undetermined significance (ASCUS) on cervical cytology with negative high risk human papilloma  virus (HPV) test result 03/30/2020   Vulvar itching 03/25/2020   Abdominal pain 01/21/2020   Shakiness 01/21/2020   Left flank pain 11/13/2019   Bloating 11/13/2019   Urinary frequency 11/13/2019   Loss of weight 06/12/2019   Boil of groin 05/03/2019   Lower abdominal pain 04/19/2019   Pain with urination 04/19/2019   GERD (gastroesophageal reflux disease) 03/18/2019   Change in stool habits 03/18/2019   Abnormal CT of the abdomen 03/18/2019   Boil, leg 03/04/2019   Screening for colorectal cancer 10/22/2018   Screening for STDs (sexually transmitted diseases) 10/22/2018   Encounter for gynecological examination with Papanicolaou smear of cervix 10/22/2018   Family planning 10/22/2018   Plantar fasciitis, right 10/22/2018   Carpal tunnel syndrome of right wrist 10/22/2018   IUD (intrauterine device) in place 01/13/2016   Migraines 03/07/2013    Past Surgical History:  Procedure Laterality Date   COLONOSCOPY N/A 05/10/2019   Normal. next colonoscopy 05/2029   ESOPHAGOGASTRODUODENOSCOPY N/A 05/10/2019   Hiatal hernia   NO PAST SURGERIES      OB History     Gravida  3   Para  3   Term  3   Preterm      AB      Living  3      SAB      IAB      Ectopic      Multiple  0   Live Births  3  Home Medications    Prior to Admission medications   Medication Sig Start Date End Date Taking? Authorizing Provider  Acetaminophen-guaiFENesin [facility] COLD & FLU PO) Take by mouth.   Yes [provider]  benzonatate (TESSALON) 100 MG capsule Take 1 capsule (100 mg total) by mouth 3 (three) times daily as needed for cough. Do not take with alcohol or while driving or operating heavy machinery.  May cause drowsiness. 07/05/22  Yes Eulogio Bear, NP  cetirizine (ZYRTEC) 10 MG tablet Take 10 mg by mouth daily.   Yes [provider]  promethazine-dextromethorphan (PROMETHAZINE-DM) 6.25-15 MG/5ML syrup Take 5 mLs by mouth at bedtime as needed  for cough. Do not take with alcohol or while driving or operating heavy machinery.  May cause drowsiness. 07/05/22  Yes Noemi Chapel A, NP  EPINEPHrine 0.3 mg/0.3 mL IJ SOAJ injection See admin instructions. 08/24/20   [provider]  ibuprofen (ADVIL) 800 MG tablet Take 800 mg by mouth as needed. 11/23/20   [provider]  levonorgestrel (MIRENA) 20 MCG/24HR IUD 1 each by Intrauterine route once.    [provider]  pantoprazole (PROTONIX) 40 MG tablet TAKE 1 TABLET BY MOUTH TWICE DAILY BEFORE A MEAL. 11/30/21   Annitta Needs, NP  Plecanatide (TRULANCE) 3 MG TABS Take 3 mg by mouth daily. 06/01/22   Rourk, Cristopher Estimable, MD  rizatriptan (MAXALT) 10 MG tablet TAKE 1 TABLET BY MOUTH ONCE AT ONSET OF SYMPTOMS, MAY REPEAT DOSE IN 2 HOURS IF NEEDED. 01/15/18   Raylene Everts, MD  vitamin C (ASCORBIC ACID) 500 MG tablet Take 500 mg by mouth daily.    [provider]    Family History Family History  Problem Relation Age of Onset   Multiple myeloma Paternal Grandfather    Cancer Paternal Grandmother        leukemia   Colon cancer Maternal Grandmother    Hypertension Father    Hyperlipidemia Father    Cancer Father        prostate   Other Mother        enlarged heart   Diabetes Mother    Hypertension Mother    Hyperlipidemia Mother    Heart disease Mother        heart murmer   Miscarriages / Stillbirths Mother    Breast cancer Mother    Other Brother        enlarged heart; colloid cyst of the third ventricle( of the brain)   Hypertension Sister    Colon cancer Maternal Aunt    Gastric cancer Maternal Uncle    Esophageal cancer Maternal Uncle 71   Stomach cancer Maternal Uncle     Social History Social History   Tobacco Use   Smoking status: Never   Smokeless tobacco: Never  Vaping Use   Vaping Use: Never used  Substance Use Topics   Alcohol use: Yes    Comment: occasional   Drug use: Never     Allergies   Amoxicillin, Penicillins,  and Venlafaxine   Review of Systems Review of Systems Per HPI  Physical Exam Triage Vital Signs ED Triage Vitals  Enc Vitals Group     BP 07/05/22 1003 (!) 144/9     Pulse Rate 07/05/22 1003 88     Resp 07/05/22 1003 18     Temp 07/05/22 1003 98.9 F (37.2 C)     Temp Source 07/05/22 1003 Oral     SpO2 07/05/22 1003 96 %  Weight --      Height --      Head Circumference --      Peak Flow --      Pain Score 07/05/22 1000 6     Pain Loc --      Pain Edu? --      Excl. in [place]? --    No data found.  Updated Vital Signs BP (!) 144/94 (BP Location: Right Wrist)   Pulse 88   Temp 98.9 F (37.2 C) (Oral)   Resp 18   SpO2 96%   Visual Acuity Right Eye Distance:   Left Eye Distance:   Bilateral Distance:    Right Eye Near:   Left Eye Near:    Bilateral Near:     Physical Exam Vitals and nursing note reviewed.  Constitutional:      General: She is not in acute distress.    Appearance: Normal appearance. She is not ill-appearing or toxic-appearing.  HENT:     Head: Normocephalic and atraumatic.     Right Ear: Tympanic membrane, ear canal and external ear normal. There is no impacted cerumen.     Left Ear: Tympanic membrane, ear canal and external ear normal.     Nose: Congestion and rhinorrhea present.     Mouth/Throat:     Mouth: Mucous membranes are moist.     Pharynx: Oropharynx is clear. Posterior oropharyngeal erythema present. No oropharyngeal exudate.  Eyes:     General: No scleral icterus.    Extraocular Movements: Extraocular movements intact.  Cardiovascular:     Rate and Rhythm: Normal rate and regular rhythm.  Pulmonary:     Effort: Pulmonary effort is normal. No respiratory distress.     Breath sounds: Normal breath sounds. No wheezing, rhonchi or rales.  Abdominal:     General: Abdomen is flat. Bowel sounds are normal. There is no distension.     Palpations: Abdomen is soft.     Tenderness: There is no abdominal tenderness.  Musculoskeletal:      Cervical back: Normal range of motion and neck supple.  Lymphadenopathy:     Cervical: No cervical adenopathy.  Skin:    General: Skin is warm and dry.     Coloration: Skin is not jaundiced or pale.     Findings: No erythema or rash.  Neurological:     Mental Status: She is alert and oriented to person, place, and time.     Motor: No weakness.  Psychiatric:        Behavior: Behavior is cooperative.      UC Treatments / Results  Labs (all labs ordered are listed, but only abnormal results are displayed) Labs Reviewed  SARS CORONAVIRUS 2 (TAT 6-24 HRS)    EKG   Radiology No results found.  Procedures Procedures (including critical care time)  Medications Ordered in UC Medications - No data to display  Initial Impression / Assessment and Plan / UC Course  I have reviewed the triage vital signs and the nursing notes.  Pertinent labs & imaging results that were available during my care of the patient were reviewed by me and considered in my medical decision making (see chart for details).    Patient is well-appearing, normotensive, afebrile, not tachycardic, not tachypneic, oxygenating well on room air.  History and examination consistent with viral upper respiratory infection.  COVID-19 testing obtained.  Supportive care discussed.  Start cough suppressants.  Note given for work.  Return and ER precautions discussed.  The  patient was given the opportunity to ask questions.  All questions answered to their satisfaction.  The patient is in agreement to this plan.    Final Clinical Impressions(s) / UC Diagnoses   Final diagnoses:  Encounter for screening for COVID-19  Viral URI with cough     Discharge Instructions      You have a viral upper respiratory infection.  This usually improves in about a week to 10 days.  A cough may last for a few weeks after a viral infection.  If your symptoms have not improved in about a week or if they worsen, please return to be  seen.  If you develop severe chest pain or shortness of breath, go to the emergency room.    We have tested you today for COVID-19.  You will see the results in Mychart and we will call you with positive results.    Please stay home and isolate until you are aware of the results.    Some things that can make you feel better are: - Increased rest - Increasing fluid with water/sugar free electrolytes - Acetaminophen and ibuprofen as needed for fever/pain.  - Salt water gargling, chloraseptic spray and throat lozenges - OTC guaifenesin (Mucinex).  - Saline sinus flushes or a neti pot.  - Humidifying the air. -Cough Perles every 8 hours as needed during the day as well as cough syrup at nighttime as needed for dry cough.     ED Prescriptions     Medication Sig Dispense Auth. Provider   benzonatate (TESSALON) 100 MG capsule Take 1 capsule (100 mg total) by mouth 3 (three) times daily as needed for cough. Do not take with alcohol or while driving or operating heavy machinery.  May cause drowsiness. 21 capsule Noemi Chapel A, NP   promethazine-dextromethorphan (PROMETHAZINE-DM) 6.25-15 MG/5ML syrup Take 5 mLs by mouth at bedtime as needed for cough. Do not take with alcohol or while driving or operating heavy machinery.  May cause drowsiness. 118 mL Eulogio Bear, NP      PDMP not reviewed this encounter.   Eulogio Bear, NP 07/05/22 1244

## 2022-07-06 LAB — SARS CORONAVIRUS 2 (TAT 6-24 HRS): SARS Coronavirus 2: NEGATIVE

## 2022-07-07 ENCOUNTER — Ambulatory Visit: Payer: BC Managed Care – PPO | Admitting: Adult Health

## 2022-07-19 DIAGNOSIS — J0140 Acute pansinusitis, unspecified: Secondary | ICD-10-CM | POA: Diagnosis not present

## 2022-07-19 DIAGNOSIS — R519 Headache, unspecified: Secondary | ICD-10-CM | POA: Diagnosis not present

## 2022-07-19 DIAGNOSIS — R42 Dizziness and giddiness: Secondary | ICD-10-CM | POA: Diagnosis not present

## 2022-07-19 DIAGNOSIS — M545 Low back pain, unspecified: Secondary | ICD-10-CM | POA: Diagnosis not present

## 2022-07-22 ENCOUNTER — Encounter: Payer: Self-pay | Admitting: Cardiovascular Disease

## 2022-07-22 DIAGNOSIS — Z6841 Body Mass Index (BMI) 40.0 and over, adult: Secondary | ICD-10-CM | POA: Diagnosis not present

## 2022-07-22 DIAGNOSIS — F32A Depression, unspecified: Secondary | ICD-10-CM | POA: Diagnosis not present

## 2022-07-22 DIAGNOSIS — K219 Gastro-esophageal reflux disease without esophagitis: Secondary | ICD-10-CM | POA: Diagnosis not present

## 2022-07-22 NOTE — Telephone Encounter (Signed)
Called patient about her symptoms. Made patient an appointment to see NP on Monday. Patient stated her pulse has been in the 50's, irregular, and she has been feeling dizzy after starting a z-pack for ear an infection. Informed patient that her previous message has been sent to Dr. Johnsie Cancel and will wait for advisement and recommendations from him. Informed patient that she is not taking anything that would cause her HR to drop and if her pulse is irregular the pulse rate might not be accurate. Encouraged patient to go to ED if her symptoms get worse.

## 2022-07-24 ENCOUNTER — Encounter (HOSPITAL_COMMUNITY): Payer: Self-pay

## 2022-07-24 ENCOUNTER — Other Ambulatory Visit: Payer: Self-pay

## 2022-07-24 ENCOUNTER — Emergency Department (HOSPITAL_COMMUNITY)
Admission: EM | Admit: 2022-07-24 | Discharge: 2022-07-24 | Disposition: A | Discharge status: Home / Self Care | Payer: BC Managed Care – PPO | Attending: Emergency Medicine | Admitting: Emergency Medicine

## 2022-07-24 ENCOUNTER — Emergency Department (HOSPITAL_COMMUNITY): Payer: BC Managed Care – PPO

## 2022-07-24 DIAGNOSIS — M545 Low back pain, unspecified: Secondary | ICD-10-CM | POA: Insufficient documentation

## 2022-07-24 DIAGNOSIS — H6692 Otitis media, unspecified, left ear: Secondary | ICD-10-CM | POA: Insufficient documentation

## 2022-07-24 DIAGNOSIS — R0789 Other chest pain: Secondary | ICD-10-CM | POA: Insufficient documentation

## 2022-07-24 DIAGNOSIS — R109 Unspecified abdominal pain: Secondary | ICD-10-CM | POA: Diagnosis not present

## 2022-07-24 DIAGNOSIS — R531 Weakness: Secondary | ICD-10-CM | POA: Diagnosis not present

## 2022-07-24 DIAGNOSIS — R42 Dizziness and giddiness: Secondary | ICD-10-CM | POA: Diagnosis not present

## 2022-07-24 DIAGNOSIS — R5383 Other fatigue: Secondary | ICD-10-CM | POA: Insufficient documentation

## 2022-07-24 DIAGNOSIS — R079 Chest pain, unspecified: Secondary | ICD-10-CM | POA: Diagnosis not present

## 2022-07-24 LAB — URINALYSIS, ROUTINE W REFLEX MICROSCOPIC
Bilirubin Urine: NEGATIVE
Glucose, UA: NEGATIVE mg/dL
Hgb urine dipstick: NEGATIVE
Ketones, ur: NEGATIVE mg/dL
Leukocytes,Ua: NEGATIVE
Nitrite: NEGATIVE
Protein, ur: NEGATIVE mg/dL
Specific Gravity, Urine: 1.006 (ref 1.005–1.030)
pH: 6 (ref 5.0–8.0)

## 2022-07-24 LAB — COMPREHENSIVE METABOLIC PANEL
ALT: 15 U/L (ref 0–44)
AST: 17 U/L (ref 15–41)
Albumin: 3.7 g/dL (ref 3.5–5.0)
Alkaline Phosphatase: 109 U/L (ref 38–126)
Anion gap: 6 (ref 5–15)
BUN: 6 mg/dL (ref 6–20)
CO2: 28 mmol/L (ref 22–32)
Calcium: 9 mg/dL (ref 8.9–10.3)
Chloride: 105 mmol/L (ref 98–111)
Creatinine, Ser: 0.63 mg/dL (ref 0.44–1.00)
GFR, Estimated: >60 mL/min (ref >60)
Glucose, Bld: 106 mg/dL — ABNORMAL HIGH (ref 70–99)
Potassium: 4 mmol/L (ref 3.5–5.1)
Sodium: 139 mmol/L (ref 135–145)
Total Bilirubin: 1.1 mg/dL (ref 0.3–1.2)
Total Protein: 7.2 g/dL (ref 6.5–8.1)

## 2022-07-24 LAB — CBC
HCT: 40.6 % (ref 36.0–46.0)
Hemoglobin: 12.9 g/dL (ref 12.0–15.0)
MCH: 27.3 pg (ref 26.0–34.0)
MCHC: 31.8 g/dL (ref 30.0–36.0)
MCV: 85.8 fL (ref 80.0–100.0)
Platelets: 288 10*3/uL (ref 150–400)
RBC: 4.73 MIL/uL (ref 3.87–5.11)
RDW: 12.6 % (ref 11.5–15.5)
WBC: 6 10*3/uL (ref 4.0–10.5)
nRBC: 0 % (ref 0.0–0.2)

## 2022-07-24 LAB — TROPONIN I (HIGH SENSITIVITY)
Troponin I (High Sensitivity): <2 ng/L (ref <18)
Troponin I (High Sensitivity): <2 ng/L (ref <18)

## 2022-07-24 LAB — LIPASE, BLOOD: Lipase: 24 U/L (ref 11–51)

## 2022-07-24 LAB — D-DIMER, QUANTITATIVE: D-Dimer, Quant: 0.36 ug/mL-FEU (ref 0.00–0.50)

## 2022-07-24 LAB — PREGNANCY, URINE: Preg Test, Ur: NEGATIVE

## 2022-07-24 MED ORDER — OXYCODONE-ACETAMINOPHEN 5-325 MG PO TABS
1.0000 | ORAL_TABLET | Freq: Once | ORAL | Status: AC
  Administered 2022-07-24: 1 via ORAL
  Filled 2022-07-24: qty 1

## 2022-07-24 MED ORDER — SULFAMETHOXAZOLE-TRIMETHOPRIM 800-160 MG PO TABS: 1.0000 | ORAL_TABLET | Freq: Two times a day (BID) | ORAL | 0 refills | Status: AC

## 2022-07-24 MED ORDER — IBUPROFEN 800 MG PO TABS: 800.0000 mg | ORAL_TABLET | Freq: Three times a day (TID) | ORAL | 0 refills | Status: DC

## 2022-07-24 NOTE — ED Provider Triage Note (Signed)
Emergency Medicine Provider Triage Evaluation Note  Kristin [name] , a 41 y.o. female  was evaluated in triage.  Pt complains of generalized weakness, fatigue and intermittent dizziness with pain of her left lower chest and flank area.  Symptoms have been present for 1 week, gradually worsening.  She describes radiating pain from her left flank to her left lower chest just below her breast.  Pain is worse with movement and deep breathing.  Associated with some dyspnea on exertion.  She was seen earlier in the week at urgent care for her dizziness and fatigue.  Had negative COVID and flu test.  Prescribed a Z-Pak which she has completed without improvement of her symptoms.  She denies any fever, cough, dysuria, nausea or vomiting.  Review of Systems  Positive: Generalized weakness, fatigue, dizziness, left rib and flank pain Negative: Abdominal pain, fever, dysuria  Physical Exam  BP (!) 129/92 (BP Location: Left Arm)   Pulse 90   Temp 98.3 F (36.8 C) (Oral)   Resp 18   Ht 5\' 4"  (1.626 m)   Wt 123.8 kg   SpO2 99%   BMI 46.86 kg/m  Gen:   Awake, no distress   Resp:  Normal effort  MSK:   Moves extremities without difficulty  Other:  Left flank and chest wall pain   Medical Decision Making  Medically screening exam initiated at 1:25 PM.  Appropriate orders placed.  Baylen Dea was informed that the remainder of the evaluation will be completed by another provider, this initial triage assessment does not replace that evaluation, and the importance of remaining in the ED until their evaluation is complete.     Kem Parkinson, PA-C 07/24/22 1349

## 2022-07-24 NOTE — ED Provider Notes (Signed)
[facility] EMERGENCY DEPARTMENT Provider Note   CSN: [id number] Arrival date & time: 07/24/22  1056     History  No chief complaint on file.   Kristin [name] is a 41 y.o. female.  HPI    Kristin [name] is a 41 y.o. female who presents to the Emergency Department complaining of generalized weakness, fatigue, intermittent dizziness and pain of her left flank and left lower chest area.  Symptoms have been present x1 week, gradually worsening.  Flank and rib pain worse with movement and deep breathing.  She is also having some dyspnea on exertion.  At onset of symptoms, she was having cough but prescribed Tessalon Perles and has since stopped she was seen earlier this week at urgent care and prescribed a Z-Pak as well without improvement of her symptoms.  She denies having any fever, nausea or vomiting, dysuria.  No history of kidney stone. She had COVID and flu test at urgent care that were negative.    Home Medications Prior to Admission medications   Medication Sig Start Date End Date Taking? Authorizing Provider  Acetaminophen-guaiFENesin [facility] COLD & FLU PO) Take by mouth.    [provider]  benzonatate (TESSALON) 100 MG capsule Take 1 capsule (100 mg total) by mouth 3 (three) times daily as needed for cough. Do not take with alcohol or while driving or operating heavy machinery.  May cause drowsiness. 07/05/22   Valentino Nose, NP  cetirizine (ZYRTEC) 10 MG tablet Take 10 mg by mouth daily.    [provider]  EPINEPHrine 0.3 mg/0.3 mL IJ SOAJ injection See admin instructions. 08/24/20   [provider]  ibuprofen (ADVIL) 800 MG tablet Take 800 mg by mouth as needed. 11/23/20   [provider]  levonorgestrel (MIRENA) 20 MCG/24HR IUD 1 each by Intrauterine route once.    [provider]  pantoprazole (PROTONIX) 40 MG tablet TAKE 1 TABLET BY MOUTH TWICE DAILY BEFORE A MEAL. 11/30/21   Gelene Mink, NP  Plecanatide  (TRULANCE) 3 MG TABS Take 3 mg by mouth daily. 06/01/22   Corbin Ade, MD  promethazine-dextromethorphan (PROMETHAZINE-DM) 6.25-15 MG/5ML syrup Take 5 mLs by mouth at bedtime as needed for cough. Do not take with alcohol or while driving or operating heavy machinery.  May cause drowsiness. 07/05/22   Valentino Nose, NP  rizatriptan (MAXALT) 10 MG tablet TAKE 1 TABLET BY MOUTH ONCE AT ONSET OF SYMPTOMS, MAY REPEAT DOSE IN 2 HOURS IF NEEDED. 01/15/18   Eustace Moore, MD  vitamin C (ASCORBIC ACID) 500 MG tablet Take 500 mg by mouth daily.    [provider]      Allergies    Amoxicillin, Penicillins, and Venlafaxine    Review of Systems   Review of Systems  Constitutional:  Negative for chills and fever.  HENT:  Negative for congestion.   Eyes:  Negative for visual disturbance.  Respiratory:  Positive for cough.   Cardiovascular:  Positive for chest pain.  Gastrointestinal:  Negative for diarrhea, nausea and vomiting.  Genitourinary:  Positive for flank pain. Negative for dysuria.  Musculoskeletal:  Negative for back pain and neck pain.  Skin:  Negative for rash.  Neurological:  Positive for dizziness. Negative for weakness, numbness and headaches.    Physical Exam Updated Vital Signs BP 123/81   Pulse 67   Temp 98.3 F (36.8 C) (Oral)   Resp 17   Ht 5\' 4"  (1.626 m)   Wt 123.8 kg  SpO2 100%   BMI 46.86 kg/m  Physical Exam Vitals and nursing note reviewed.  Constitutional:      General: She is not in acute distress.    Appearance: Normal appearance. She is not ill-appearing or toxic-appearing.  HENT:     Head: Atraumatic.     Right Ear: Tympanic membrane and ear canal normal.     Left Ear: Ear canal normal.     Ears:     Comments: Erythema of the left TM.  No perforation or bulging.  No drainage of the ear canal.    Mouth/Throat:     Mouth: Mucous membranes are moist.     Pharynx: Oropharynx is clear.  Eyes:     Conjunctiva/sclera: Conjunctivae  normal.     Pupils: Pupils are equal, round, and reactive to light.  Cardiovascular:     Rate and Rhythm: Normal rate and regular rhythm.     Pulses: Normal pulses.  Pulmonary:     Effort: Pulmonary effort is normal. No respiratory distress.     Breath sounds: No stridor. No wheezing or rales.     Comments: Mild tenderness that is reproducible with palpation left flank.  No skin changes or crepitus on exam. Abdominal:     General: There is no distension.     Palpations: Abdomen is soft.     Tenderness: There is no abdominal tenderness. There is no right CVA tenderness or left CVA tenderness.  Musculoskeletal:        General: Tenderness present.     Cervical back: Normal range of motion. No rigidity.     Comments: Tenderness palpation left lumbar paraspinal muscles.  No midline tenderness.  Hip flexors extensors intact.  Skin:    General: Skin is warm.     Capillary Refill: Capillary refill takes less than 2 seconds.     Findings: No rash.  Neurological:     General: No focal deficit present.     Mental Status: She is alert.     Sensory: Sensation is intact. No sensory deficit.     Motor: Motor function is intact. No weakness.     Comments: CN II through XII grossly intact.     ED Results / Procedures / Treatments   Labs (all labs ordered are listed, but only abnormal results are displayed) Labs Reviewed  COMPREHENSIVE METABOLIC PANEL - Abnormal; Notable for the following components:      Result Value   Glucose, Bld 106 (*)    All other components within normal limits  LIPASE, BLOOD  CBC  URINALYSIS, ROUTINE W REFLEX MICROSCOPIC  D-DIMER, QUANTITATIVE  PREGNANCY, URINE  TROPONIN I (HIGH SENSITIVITY)  TROPONIN I (HIGH SENSITIVITY)    EKG EKG Interpretation  Date/Time:  Sunday July 24 2022 11:28:08 EDT Ventricular Rate:  80 PR Interval:  152 QRS Duration: 88 QT Interval:  380 QTC Calculation: 438 R Axis:   51 Text Interpretation: Normal sinus rhythm with sinus  arrhythmia Low voltage QRS Borderline ECG When compared with ECG of 30-Apr-2020 11:13, No significant change was found Confirmed by Vanetta Mulders [phone]) on 07/24/2022 2:59:55 PM  Radiology DG Chest 2 View  Result Date: 07/24/2022 CLINICAL DATA:  Chest pain. EXAM: CHEST - 2 VIEW COMPARISON:  April 30, 2020. FINDINGS: The heart size and mediastinal contours are within normal limits. Both lungs are clear. The visualized skeletal structures are unremarkable. IMPRESSION: No active cardiopulmonary disease. Electronically Signed   By: Lupita Raider M.D.   On: 07/24/2022 14:59  Procedures Procedures    Medications Ordered in ED Medications  oxyCODONE-acetaminophen (PERCOCET/ROXICET) 5-325 MG per tablet 1 tablet (has no administration in time range)    ED Course/ Medical Decision Making/ A&P                           Medical Decision Making Patient here with complaints of left flank and left chest wall pain.  Symptoms present for 1 week.  Associated with some dyspnea on exertion.  She reports cough at onset that has resolved since taking Tessalon Perles.  Also having some dizziness with standing and generalized weakness.  Seen previously at urgent care, had negative flu and COVID test  On my exam, patient is ambulatory with steady gait.  No focal neurodeficits on exam.  She is well-appearing and nontoxic.  PERC negative.  She does have some tenderness to palpation of the left lateral chest wall and left lower lumbar paraspinal muscles.  She does appear to have a left otitis media which would explain her intermittent dizziness.  There is no CVA tenderness and no nuchal rigidity.  No concerns for meningitis or SAH  Differential would include but not limited to kidney stone, musculoskeletal pain, pneumonia, ACS, PE, electrolyte imbalance or dehydration.  Clinically, I suspect her symptoms are related to her viral illness.  She will need chest x-ray to rule out pneumonia, will also obtain  D-dimer to rule out PE given the fact that she has some dyspnea on exertion.  We will check baseline labs and rule out ACS.  Amount and/or Complexity of Data Reviewed Labs: ordered.    Details: Labs interpreted by me, no evidence of leukocytosis, lipase unremarkable.  Chemistries without significant derangement.  D-dimer reassuring.  Initial troponin less than 2.  Urinalysis without evidence of infection and pregnancy test is negative. Radiology: ordered.    Details: Chest x-ray without acute cardiopulmonary finding. ECG/medicine tests: ordered.    Details: EKG shows sinus rhythm with sinus arrhythmia no significant change from previous Discussion of management or test interpretation with external provider(s): Overall, patient's work-up here reassuring.  Chest wall pain felt to be related to recent coughing and likely viral process.  Work-up reassuring and no evidence of ACS, PE or pneumonia.  Intermittent dizziness felt to be related to patient's left otitis media.  I feel that it is reasonable to proceed with antibiotic.  She will continue fluid intake and prescription for NSAID for pain relief.  Appropriate for discharge home, will follow-up closely with PCP and return precautions were discussed  Risk Prescription drug management.  Also having some dizziness with standing.  Left flank and left chest wall pain         Final Clinical Impression(s) / ED Diagnoses Final diagnoses:  Otitis of left ear  Chest wall pain    Rx / DC Orders ED Discharge Orders          Ordered    sulfamethoxazole-trimethoprim (BACTRIM DS) 800-160 MG tablet  2 times daily        07/24/22 1833    ibuprofen (ADVIL) 800 MG tablet  3 times daily        07/24/22 1833              Kem Parkinson, PA-C 07/27/22 1438    Fredia Sorrow, MD 08/13/22 1256

## 2022-07-24 NOTE — Progress Notes (Deleted)
Office Visit    Patient Name: Kristin [name] Date of Encounter: 07/24/2022  Primary Care Provider:  Quinn Plowman, MD Primary Cardiologist:  Jenkins Rouge, MD Primary Electrophysiologist: None  Chief Complaint    Kristin [name] is a 41 y.o. female with PMH of GERD, depression, palpitations who presents today for post ED follow-up.  Past Medical History    Past Medical History:  Diagnosis Date   Allergy    Anxiety    Atypical squamous cell changes of undetermined significance (ASCUS) on cervical cytology with negative high risk human papilloma virus (HPV) test result 03/30/2020   6/21 repeat in 3 years ASCCP guidelines 5 year risk CIN 3+ 0.27%   Carpal tunnel syndrome of right wrist 10/22/2018   Contraceptive management 11/05/2013   Depression    GERD (gastroesophageal reflux disease)    IUD (intrauterine device) in place 01/13/2016   Migraines    Missed periods 01/13/2016   Vaginal Pap smear, abnormal    Past Surgical History:  Procedure Laterality Date   COLONOSCOPY N/A 05/10/2019   Normal. next colonoscopy 05/2029   ESOPHAGOGASTRODUODENOSCOPY N/A 05/10/2019   Hiatal hernia   NO PAST SURGERIES      Allergies  Allergies  Allergen Reactions   Amoxicillin Swelling   Penicillins Swelling   Venlafaxine     Other reaction(s): heart palpitations    History of Present Illness    Kristin [name]  is a *** year old *** with the above mention past medical history who presents today for**      Since last being seen in the office patient reports***.  Patient denies chest pain, palpitations, dyspnea, PND, orthopnea, nausea, vomiting, dizziness, syncope, edema, weight gain, or early satiety.     ***Notes:  Home Medications    Current Outpatient Medications  Medication Sig Dispense Refill   Acetaminophen-guaiFENesin (MUCINEX COLD & FLU PO) Take by mouth.     benzonatate (TESSALON) 100 MG capsule Take 1 capsule (100 mg total) by mouth 3 (three) times daily  as needed for cough. Do not take with alcohol or while driving or operating heavy machinery.  May cause drowsiness. 21 capsule 0   cetirizine (ZYRTEC) 10 MG tablet Take 10 mg by mouth daily.     EPINEPHrine 0.3 mg/0.3 mL IJ SOAJ injection See admin instructions.     ibuprofen (ADVIL) 800 MG tablet Take 800 mg by mouth as needed.     levonorgestrel (MIRENA) 20 MCG/24HR IUD 1 each by Intrauterine route once.     pantoprazole (PROTONIX) 40 MG tablet TAKE 1 TABLET BY MOUTH TWICE DAILY BEFORE A MEAL. 60 tablet 5   Plecanatide (TRULANCE) 3 MG TABS Take 3 mg by mouth daily. 90 tablet 3   promethazine-dextromethorphan (PROMETHAZINE-DM) 6.25-15 MG/5ML syrup Take 5 mLs by mouth at bedtime as needed for cough. Do not take with alcohol or while driving or operating heavy machinery.  May cause drowsiness. 118 mL 0   rizatriptan (MAXALT) 10 MG tablet TAKE 1 TABLET BY MOUTH ONCE AT ONSET OF SYMPTOMS, MAY REPEAT DOSE IN 2 HOURS IF NEEDED. 9 tablet 0   vitamin C (ASCORBIC ACID) 500 MG tablet Take 500 mg by mouth daily.     No current facility-administered medications for this visit.     Review of Systems  Please see the history of present illness.    (+)*** (+)***  All other systems reviewed and are otherwise negative except as noted above.  Physical Exam    Wt Readings from Last  3 Encounters:  07/24/22 273 lb (123.8 kg)  05/31/22 276 lb 9.6 oz (125.5 kg)  10/26/21 270 lb 3.2 oz (122.6 kg)   TA:VWPVX were no vitals filed for this visit.,There is no height or weight on file to calculate BMI.  Constitutional:      Appearance: Healthy appearance. Not in distress.  Neck:     Vascular: JVD normal.  Pulmonary:     Effort: Pulmonary effort is normal.     Breath sounds: No wheezing. No rales. Diminished in the bases Cardiovascular:     Normal rate. Regular rhythm. Normal S1. Normal S2.      Murmurs: There is no murmur.  Edema:    Peripheral edema absent.  Abdominal:     Palpations: Abdomen is soft  non tender. There is no hepatomegaly.  Skin:    General: Skin is warm and dry.  Neurological:     General: No focal deficit present.     Mental Status: Alert and oriented to person, place and time.     Cranial Nerves: Cranial nerves are intact.  EKG/LABS/Other Studies Reviewed    ECG personally reviewed by me today - ***  Risk Assessment/Calculations:   {Does this patient have ATRIAL FIBRILLATION?:[phone]}        Lab Results  Component Value Date   WBC 6.0 07/24/2022   HGB 12.9 07/24/2022   HCT 40.6 07/24/2022   MCV 85.8 07/24/2022   PLT 288 07/24/2022   Lab Results  Component Value Date   CREATININE 0.63 07/24/2022   BUN 6 07/24/2022   NA 139 07/24/2022   K 4.0 07/24/2022   CL 105 07/24/2022   CO2 28 07/24/2022   Lab Results  Component Value Date   ALT 15 07/24/2022   AST 17 07/24/2022   ALKPHOS 109 07/24/2022   BILITOT 1.1 07/24/2022   Lab Results  Component Value Date   CHOL 158 06/23/2017   HDL 49 (L) 06/23/2017   LDLCALC 94 06/23/2017   TRIG 66 06/23/2017   CHOLHDL 3.2 06/23/2017    Lab Results  Component Value Date   HGBA1C 5.2 06/22/2021    Assessment & Plan    1.  Chest pain  2.  Dizziness  3.***  4.***      Disposition: Follow-up with Charlton Haws, MD or APP in *** months {Are you ordering a CV Procedure (e.g. stress test, cath, DCCV, TEE, etc)?   Press F2        :[id number]}   Medication Adjustments/Labs and Tests Ordered: Current medicines are reviewed at length with the patient today.  Concerns regarding medicines are outlined above.   Signed, Napoleon Form, Leodis Rains, NP 07/24/2022, 5:29 PM [facility]  Note:  This document was prepared using Dragon voice recognition software and may include unintentional dictation errors.

## 2022-07-24 NOTE — Discharge Instructions (Signed)
Take the antibiotic as directed until finished.  You may alternate ice and heat to your chest if needed.  Follow-up with your primary care provider later this week for recheck.

## 2022-07-24 NOTE — ED Triage Notes (Signed)
Last Sunday started feeling weak, tired, lightheaded. Seen at [facility] and dx with fluid in her ears, pt given a Z-pack. Pt reports feeling dizzy, neck tightness, left rib pain, and back pain. Pt reports less appetite. Pt reports losing 5lbs since Sunday.

## 2022-07-25 ENCOUNTER — Telehealth: Payer: Self-pay | Admitting: Cardiovascular Disease

## 2022-07-25 ENCOUNTER — Ambulatory Visit: Payer: BC Managed Care – PPO | Admitting: Nurse Practitioner

## 2022-07-25 NOTE — Telephone Encounter (Signed)
Pt is requesting call back to see if she could get results of labs or anything done from her hospital encounter 10/22.

## 2022-07-25 NOTE — Telephone Encounter (Signed)
Patient would like provider to look over Labs, EKG, CXR to make sure everything is fine.

## 2022-07-26 ENCOUNTER — Encounter: Payer: Self-pay | Admitting: Internal Medicine

## 2022-07-26 ENCOUNTER — Ambulatory Visit: Payer: BC Managed Care – PPO | Admitting: Internal Medicine

## 2022-07-26 VITALS — BP 135/79 | HR 71 | Resp 16 | Ht 64.0 in | Wt 273.8 lb

## 2022-07-26 DIAGNOSIS — Z1231 Encounter for screening mammogram for malignant neoplasm of breast: Secondary | ICD-10-CM

## 2022-07-26 DIAGNOSIS — M549 Dorsalgia, unspecified: Secondary | ICD-10-CM | POA: Diagnosis not present

## 2022-07-26 MED ORDER — NAPROXEN 500 MG PO TABS: 500.0000 mg | ORAL_TABLET | Freq: Two times a day (BID) | ORAL | 0 refills | Status: AC

## 2022-07-26 NOTE — Progress Notes (Unsigned)
     CC: lump under left arm pit ,pain in upper back , establish care  HPI:Ms.Kristin [name] is a 41 y.o. female who presents for evaluation of ***. For the details of today's visit, please refer to the assessment and plan.  Patient has noticed under her armpits is dispaportional. Over a year. Mammogram normal  Pain in upper back. 3 weeks. Ibuprofen helped. Muscle relaxer.    Depression, PHQ-9: Based on the patients  [place] Visit from 07/26/2022 in [facility]  PHQ-9 Total Score 10      score we have ***.  Past Medical History:  Diagnosis Date   Allergy    Anxiety    Atypical squamous cell changes of undetermined significance (ASCUS) on cervical cytology with negative high risk human papilloma virus (HPV) test result 03/30/2020   6/21 repeat in 3 years ASCCP guidelines 5 year risk CIN 3+ 0.27%   Carpal tunnel syndrome of right wrist 10/22/2018   Contraceptive management 11/05/2013   Depression    GERD (gastroesophageal reflux disease)    IUD (intrauterine device) in place 01/13/2016   Migraines    Missed periods 01/13/2016   Vaginal Pap smear, abnormal     Past Surgical History:  Procedure Laterality Date   COLONOSCOPY N/A 05/10/2019   Normal. next colonoscopy 05/2029   ESOPHAGOGASTRODUODENOSCOPY N/A 05/10/2019   Hiatal hernia   NO PAST SURGERIES      Family History  Problem Relation Age of Onset   Multiple myeloma Paternal Grandfather    Cancer Paternal Grandmother        leukemia   Colon cancer Maternal Grandmother    Hypertension Father    Hyperlipidemia Father    Cancer Father        prostate   Other Mother        enlarged heart   Diabetes Mother    Hypertension Mother    Hyperlipidemia Mother    Heart disease Mother        heart murmer   Miscarriages / Stillbirths Mother    Breast cancer Mother    Other Brother        enlarged heart; colloid cyst of the third ventricle( of the brain)   Hypertension Sister    Colon cancer  Maternal Aunt    Gastric cancer Maternal Uncle    Esophageal cancer Maternal Uncle 71   Stomach cancer Maternal Uncle     Social History   Tobacco Use   Smoking status: Never   Smokeless tobacco: Never  Vaping Use   Vaping Use: Never used  Substance Use Topics   Alcohol use: Yes    Comment: occasional   Drug use: Never    Review of Systems:    Review of Systems  Constitutional:  Negative for chills and fever.  Respiratory:  Negative for cough and shortness of breath.   Musculoskeletal:  Positive for back pain. Negative for joint pain.     Physical Exam: Vitals:   07/26/22 1340  BP: 135/79  Pulse: 71  Resp: 16  SpO2: 98%  Weight: 273 lb 12.8 oz (124.2 kg)  Height: $Remove'5\' 4"'nrFfkra$  (1.626 m)     Physical Exam   Assessment & Plan:   No problem-specific Assessment & Plan notes found for this encounter.   *** Assessment/Plan: ***  *** Assessment/Plan: ***  *** Assessment/Plan: ***  Lorene Dy, MD

## 2022-07-26 NOTE — Patient Instructions (Addendum)
Thank you for trusting me with your care. To recap, today we discussed the following:  Take Naproxen for 10 days for back pain. Do not take ibuprofen when taking naproxen. Use your muscle relaxer at home if needed.   Screening Mammogram order placed.  Follow up in 6 months or sooner if back pain is not improving.

## 2022-07-27 DIAGNOSIS — Z01419 Encounter for gynecological examination (general) (routine) without abnormal findings: Secondary | ICD-10-CM

## 2022-07-27 DIAGNOSIS — M549 Dorsalgia, unspecified: Secondary | ICD-10-CM | POA: Insufficient documentation

## 2022-07-27 DIAGNOSIS — Z1231 Encounter for screening mammogram for malignant neoplasm of breast: Secondary | ICD-10-CM | POA: Insufficient documentation

## 2022-07-27 HISTORY — DX: Encounter for gynecological examination (general) (routine) without abnormal findings: Z01.419

## 2022-07-27 NOTE — Assessment & Plan Note (Addendum)
Patient has lost 3 pounds in the last 2 months.Gained weight with her pregnancies and has not gotten back to normal weight. Chart review at [facility] shows patient was referred to weight management. Patient had dry mouth with phentermine and Topiramate had limited effect. She was not interested in bariatric surgery at that time.   Assessment/Plan: Morbid obesity. Patient is targeted her diet and encourage to increase activity.She has weight recently with this strategy. Offered referral to provider referral exercise program , but patient's schedule will not allow. Continue to monitor weight and discuss weight loss strategies at next visit.

## 2022-07-27 NOTE — Assessment & Plan Note (Signed)
Patient has back pain in upper back off and on. Acute episode started 3 weeks ago. The pain is located in her left upper back. She has taken occasional ibuprofen which has helped pain. She has Flexeril at home, but has avoided taking it because it makes her drowsy.   Assessment/Plan: Patient is having acute exacerbation of a chronic upper back pain. Exam suggest this is muscle strain. Patient has large breast and I believe this is causing her posture changes and therefore muscle strain. She would be unable to attend physical therapy at this time due to her childrens' after school schedule. Recommended upper back exercises and a 10 day course of naproxen. Follow up if not improving.

## 2022-07-27 NOTE — Assessment & Plan Note (Signed)
Patient has noticed under her armpits is dispaportional. She first noticed this a year ago. She had a normal mammogram last year.   Assessment/Plan: No identifiable nodule or enlarged lymph node on exam. Patient will continue to monitor for any changes,but my exam suggest this is normal asymmetry of her body. It is time to repeat yearly mammogram.  - MM Digital Screening

## 2022-07-29 ENCOUNTER — Ambulatory Visit (INDEPENDENT_AMBULATORY_CARE_PROVIDER_SITE_OTHER): Payer: BC Managed Care – PPO | Admitting: Adult Health

## 2022-07-29 ENCOUNTER — Encounter: Payer: Self-pay | Admitting: Adult Health

## 2022-07-29 VITALS — BP 107/76 | HR 71 | Ht 64.0 in | Wt 273.5 lb

## 2022-07-29 DIAGNOSIS — Z1211 Encounter for screening for malignant neoplasm of colon: Secondary | ICD-10-CM

## 2022-07-29 DIAGNOSIS — K649 Unspecified hemorrhoids: Secondary | ICD-10-CM

## 2022-07-29 DIAGNOSIS — R42 Dizziness and giddiness: Secondary | ICD-10-CM | POA: Diagnosis not present

## 2022-07-29 DIAGNOSIS — F419 Anxiety disorder, unspecified: Secondary | ICD-10-CM

## 2022-07-29 DIAGNOSIS — Z975 Presence of (intrauterine) contraceptive device: Secondary | ICD-10-CM

## 2022-07-29 DIAGNOSIS — H6692 Otitis media, unspecified, left ear: Secondary | ICD-10-CM | POA: Insufficient documentation

## 2022-07-29 DIAGNOSIS — F32A Depression, unspecified: Secondary | ICD-10-CM

## 2022-07-29 DIAGNOSIS — Z01419 Encounter for gynecological examination (general) (routine) without abnormal findings: Secondary | ICD-10-CM

## 2022-07-29 DIAGNOSIS — R110 Nausea: Secondary | ICD-10-CM | POA: Insufficient documentation

## 2022-07-29 LAB — HEMOCCULT GUIAC POC 1CARD (OFFICE): Fecal Occult Blood, POC: NEGATIVE

## 2022-07-29 MED ORDER — SERTRALINE HCL 50 MG PO TABS: 50.0000 mg | ORAL_TABLET | Freq: Every day | ORAL | 3 refills | Status: DC

## 2022-07-29 MED ORDER — ONDANSETRON 8 MG PO TBDP
8.0000 mg | ORAL_TABLET | Freq: Three times a day (TID) | ORAL | 1 refills | Status: DC | PRN
Start: 2022-07-29 — End: 2022-10-21

## 2022-07-29 MED ORDER — MECLIZINE HCL 50 MG PO TABS: ORAL_TABLET | ORAL | 0 refills | Status: DC

## 2022-07-29 NOTE — Progress Notes (Signed)
Patient ID: Kristin [name], female   DOB: 01-16-1981, 41 y.o.   MRN: [id number] History of Present Illness: Kristin [name] is a 41 year old black female, divorced, G3P3003, in for a well woman gyn exam. She is complaining of being dizzy and has some nausea. She was seen at Urgent Care recently and prescribed a Z pack for fluid in ear and then 07/24/22 was seen in ER and prescribed Bactrim for otitis media. Though she was feeling better then out side [place].   Last pap was negative HPV and malignancy.  PCP is Gilmore Laroche, NP.  Current Medications, Allergies, Past Medical History, Past Surgical History, Family History and Social History were reviewed in [employer] record.     Review of Systems: Patient denies any headaches, hearing loss, fatigue, blurred vision, shortness of breath, chest pain, abdominal pain, problems with bowel movements, urination, or intercourse. No joint pain or mood swings.  +dizzy, with some nausea    Physical Exam:BP 107/76 (BP Location: Left Arm, Patient Position: Sitting, Cuff Size: Large)   Pulse 71   Ht 5\' 4"  (1.626 m)   Wt 273 lb 8 oz (124.1 kg)   BMI 46.95 kg/m   General:  Well developed, well nourished, no acute distress Skin:  Warm and dry Neck:  Midline trachea, normal thyroid, good ROM, no lymphadenopathy, left ear red canal  Lungs; Clear to auscultation bilaterally Breast:  No dominant palpable mass, retraction, or nipple discharge Cardiovascular: Regular rate and rhythm Abdomen:  Soft, non tender, no hepatosplenomegaly Pelvic:  External genitalia is normal in appearance, no lesions.  The vagina is normal in appearance. Urethra has no lesions or masses. The cervix is bulbous.+IUD strings at os.  Uterus is felt to be normal size, shape, and contour.  No adnexal masses or tenderness noted.Bladder is non tender, no masses felt. Rectal: Good sphincter tone, no polyps, + hemorrhoids felt.  Hemoccult  negative. Extremities/musculoskeletal:  No swelling or varicosities noted, no clubbing or cyanosis Psych:  No mood changes, alert and cooperative,seems happy AA is 1 Fall risk is low    07/29/2022   11:29 AM 07/26/2022    1:39 PM 03/25/2021    9:19 AM  Depression screen PHQ 2/9  Decreased Interest 1 2 1   Down, Depressed, Hopeless 2 2 1   PHQ - 2 Score 3 4 2   Altered sleeping 2 1 2   Tired, decreased energy 2 3 2   Change in appetite 2 1 1   Feeling bad or failure about yourself  1 0 0  Trouble concentrating 2 1 1   Moving slowly or fidgety/restless 0 0 0  Suicidal thoughts 0 0 0  PHQ-9 Score 12 10 8   Difficult doing work/chores  Somewhat difficult        07/29/2022   11:30 AM 03/25/2021    9:20 AM 03/25/2020   10:55 AM  GAD 7 : Generalized Anxiety Score  Nervous, Anxious, on Edge 2 1 1   Control/stop worrying 1 1 1   Worry too much - different things 1 1 1   Trouble relaxing 2 1 1   Restless 1 0 0  Easily annoyed or irritable 1 1 1   Afraid - awful might happen 2 1 1   Total GAD 7 Score 10 6 6     Upstream - 07/29/22 1128       Pregnancy Intention Screening   Does the patient want to become pregnant in the next year? No    Does the patient's partner want to  become pregnant in the next year? No    Would the patient like to discuss contraceptive options today? No      Contraception Wrap Up   Current Method IUD or IUS    End Method IUD or IUS              Examination chaperoned by Levy Pupa LPN  Impression and Plan: 1. Encounter for well woman exam with routine gynecological exam Physical in 1 year Pap in 2025 Mammogram 08/04/22 Colonoscopy per GI Labs with PCP  2. Encounter for screening fecal occult blood testing Hemoccult negative   3. Vertigo Rise slowly  Do not drive while dizzy Push fluids Will rx meclizine 50 mg 1 every 8-12 hours prn dizziness    Meds ordered this encounter  Medications   ondansetron (ZOFRAN-ODT) 8 MG disintegrating tablet    Sig:  Take 1 tablet (8 mg total) by mouth every 8 (eight) hours as needed for nausea or vomiting.    Dispense:  30 tablet    Refill:  1    Order Specific Question:   Supervising Provider    Answer:   Tania Ade H [[id number]]   meclizine (ANTIVERT) 50 MG tablet    Sig: Take 1 every 8-12 hours as needed for dizziness    Dispense:  30 tablet    Refill:  0    Order Specific Question:   Supervising Provider    Answer:   Tania Ade H [[id number]]   sertraline (ZOLOFT) 50 MG tablet    Sig: Take 1 tablet (50 mg total) by mouth daily.    Dispense:  30 tablet    Refill:  3    Order Specific Question:   Supervising Provider    Answer:   Elonda Husky, LUTHER H [[id number]]     4. Left acute otitis media Continue bactrim   5. Hemorrhoids, unspecified hemorrhoid type Use TUCKS PADS Try preparation H or Anusol OTC   6. IUD (intrauterine device) in place Mirena placed 07/30/15 Replace next year  7. Nausea Will rx zofran 8 ng ODT prn   8. Anxiety and depression  Will rx Zoloft 50 mg 1 daily  Follow up with me in 8 weeks for ROS

## 2022-08-02 ENCOUNTER — Inpatient Hospital Stay
Admission: RE | Admit: 2022-08-02 | Discharge: 2022-08-02 | Disposition: A | Discharge status: Home / Self Care | Payer: Self-pay | Source: Ambulatory Visit | Attending: Internal Medicine | Admitting: Internal Medicine

## 2022-08-02 ENCOUNTER — Other Ambulatory Visit: Payer: Self-pay | Admitting: Internal Medicine

## 2022-08-02 DIAGNOSIS — Z1231 Encounter for screening mammogram for malignant neoplasm of breast: Secondary | ICD-10-CM

## 2022-08-04 ENCOUNTER — Ambulatory Visit (HOSPITAL_COMMUNITY)
Admission: RE | Admit: 2022-08-04 | Discharge: 2022-08-04 | Disposition: A | Discharge status: Home / Self Care | Payer: BC Managed Care – PPO | Source: Ambulatory Visit | Attending: Internal Medicine | Admitting: Internal Medicine

## 2022-08-04 DIAGNOSIS — Z1231 Encounter for screening mammogram for malignant neoplasm of breast: Secondary | ICD-10-CM | POA: Diagnosis not present

## 2022-08-08 ENCOUNTER — Ambulatory Visit: Payer: BC Managed Care – PPO | Admitting: Family Medicine

## 2022-08-19 ENCOUNTER — Encounter: Payer: Self-pay | Admitting: Family Medicine

## 2022-08-19 ENCOUNTER — Ambulatory Visit: Payer: BC Managed Care – PPO | Admitting: Family Medicine

## 2022-08-19 DIAGNOSIS — G43909 Migraine, unspecified, not intractable, without status migrainosus: Secondary | ICD-10-CM

## 2022-08-19 DIAGNOSIS — G43911 Migraine, unspecified, intractable, with status migrainosus: Secondary | ICD-10-CM

## 2022-08-19 MED ORDER — PREDNISONE 10 MG PO TABS: 10.0000 mg | ORAL_TABLET | Freq: Two times a day (BID) | ORAL | 0 refills | Status: DC

## 2022-08-19 MED ORDER — TOPIRAMATE 25 MG PO TABS: 25.0000 mg | ORAL_TABLET | Freq: Two times a day (BID) | ORAL | 1 refills | Status: DC

## 2022-08-19 NOTE — Progress Notes (Unsigned)
Virtual Visit via Telephone Note  I connected with Kristin [name] on 08/19/22 at  2:40 PM EST by telephone and verified that I am speaking with the correct person using two identifiers.  Location: Patient: home Provider: office   I discussed the limitations, risks, security and privacy concerns of performing an evaluation and management service by telephone and the availability of in person appointments. I also discussed with the patient that there may be a patient responsible charge related to this service. The patient expressed understanding and agreed to proceed.   History of Present Illness: Intermittent headache x 2 weeks from temples to front of head, also in her neck, feels like tension , when she lies down feels heart beat in her head, sometimes affects her vision, feels as though vibrating  does get flashing light at times before the headache starts Took maxalt yesterday got some relief, did not repeat as had to be at work and states she feels somewhat drowsy after taking it No new weakness , or numbness, no change in vision Observations/Objective: Good communication with no confusion and intact memory. Alert  No signs of respiratory distress during speech   Assessment and Plan:  Migraines Uncontrolled , reports twice monthly headaches, currently experiencing cluster headache. Short course of prednisone to be taken with one additional maxalt today, she is also to start topamax, and schedule appt with PCp in next 3 to 5 weeks  Follow Up Instructions:    I discussed the assessment and treatment plan with the patient. The patient was provided an opportunity to ask questions and all were answered. The patient agreed with the plan and demonstrated an understanding of the instructions.   The patient was advised to call back or seek an in-person evaluation if the symptoms worsen or if the condition fails to improve as anticipated.  I provided 13 minutes of non-face-to-face time  during this encounter.   Syliva Overman, MD

## 2022-08-19 NOTE — Patient Instructions (Signed)
Please schedule an appt with PCP, Malachi Bonds to follow up and manage headaches in next 3 to 5 weeks,  call if you need to be seen sooner   You are treated for uncontrolled migraine headache, if you develop any new neurologic symptoms, like weakness , numbness , blurred vision, you need to go to the ED  5 day course of prednisone is prescribed to abort the headache , do not takee the ibuprofen since  you are taking the prednisone Take maxalt one tablet today as discussed  Start twice daily topamax to reduce headache frequency  Use additional  protection for preventing pregnancy since the topamax may make your currne tbirht control medication less effective

## 2022-08-21 ENCOUNTER — Encounter: Payer: Self-pay | Admitting: Family Medicine

## 2022-08-21 NOTE — Assessment & Plan Note (Signed)
Uncontrolled , reports twice monthly headaches, currently experiencing cluster headache. Short course of prednisone to be taken with one additional maxalt today, she is also to start topamax, and schedule appt with PCp in next 3 to 5 weeks

## 2022-08-22 ENCOUNTER — Ambulatory Visit: Payer: BC Managed Care – PPO | Admitting: Internal Medicine

## 2022-08-29 DIAGNOSIS — H1033 Unspecified acute conjunctivitis, bilateral: Secondary | ICD-10-CM | POA: Diagnosis not present

## 2022-09-01 ENCOUNTER — Encounter: Payer: Self-pay | Admitting: Family Medicine

## 2022-09-01 ENCOUNTER — Ambulatory Visit: Payer: BC Managed Care – PPO | Admitting: Family Medicine

## 2022-09-01 VITALS — BP 131/86 | HR 80 | Ht 64.0 in | Wt 275.1 lb

## 2022-09-01 DIAGNOSIS — G43911 Migraine, unspecified, intractable, with status migrainosus: Secondary | ICD-10-CM | POA: Diagnosis not present

## 2022-09-01 DIAGNOSIS — R42 Dizziness and giddiness: Secondary | ICD-10-CM | POA: Diagnosis not present

## 2022-09-01 DIAGNOSIS — F32A Depression, unspecified: Secondary | ICD-10-CM | POA: Diagnosis not present

## 2022-09-01 DIAGNOSIS — F419 Anxiety disorder, unspecified: Secondary | ICD-10-CM | POA: Diagnosis not present

## 2022-09-01 NOTE — Patient Instructions (Signed)
I appreciate the opportunity to provide care to you today!    Follow up:  3 months  Labs: next visit   Referrals today-  [facility]   Please continue to a heart-healthy diet and increase your physical activities. Try to exercise for at least three times a week.      It was a pleasure to see you and I look forward to continuing to work together on your health and well-being. Please do not hesitate to call the office if you need care or have questions about your care.   Have a wonderful day and week. With Gratitude, Gilmore Laroche MSN, FNP-BC

## 2022-09-01 NOTE — Assessment & Plan Note (Addendum)
She denies suicidal thoughts and ideation She reports that she does not want to be on medications for her anxiety and depression Encourage patient to inform me if having thoughts of suicidal ideation and homicidal ideation

## 2022-09-01 NOTE — Assessment & Plan Note (Signed)
Referral placed to [facility] neurology Patient reports compliance with Topamax 25 mg twice daily and she takes rizatriptan 10 mg as needed She reports relief of her symptoms with treatment regimen

## 2022-09-01 NOTE — Progress Notes (Signed)
Established Patient Office Visit  Subjective:  Patient ID: Kristin [name], female    DOB: 10/22/1980  Age: 41 y.o. MRN: [id number]  CC:  Chief Complaint  Patient presents with   Follow-up    F/u pt reports side effect to zoloft medication due to tremors in her hands and legs, stopped taking medication on 08/26/22. Pt reports feeling slightly better.    HPI Kristin [name] is a 41 y.o. female with past medical history of anxiety and depression, migraines, vertigo presents for f/u of  chronic medical conditions.  Anxiety and depression: She reports stopping Zoloft 50 mg due to side effects of the medication.  She complains of increased fatigue while on treatment.  She denies suicidal thoughts and ideation.  Migraines: Chronic condition since childhood.  She was following up with [facility], but reported that the provider that she was seeing is leaving the practice and would like a referral to another specialist.  She reports relief of her symptoms since she was seen on 08/19/2022.  Vertigo: Symptom onset of symptoms since October 2023.  A referral was placed to ENT by her neurologist.  She takes meclizine 50 mg as needed only for symptom relief.  Past Medical History:  Diagnosis Date   Allergy    Anxiety    Atypical squamous cell changes of undetermined significance (ASCUS) on cervical cytology with negative high risk human papilloma virus (HPV) test result 03/30/2020   6/21 repeat in 3 years ASCCP guidelines 5 year risk CIN 3+ 0.27%   Carpal tunnel syndrome of right wrist 10/22/2018   Contraceptive management 11/05/2013   Depression    GERD (gastroesophageal reflux disease)    IUD (intrauterine device) in place 01/13/2016   Migraines    Missed periods 01/13/2016   Vaginal Pap smear, abnormal     Past Surgical History:  Procedure Laterality Date   COLONOSCOPY N/A 05/10/2019   Normal. next colonoscopy 05/2029   ESOPHAGOGASTRODUODENOSCOPY N/A 05/10/2019   Hiatal hernia    NO PAST SURGERIES      Family History  Problem Relation Age of Onset   Multiple myeloma Paternal Grandfather    Cancer Paternal Grandmother        leukemia   Colon cancer Maternal Grandmother    Hypertension Father    Hyperlipidemia Father    Cancer Father        prostate   Other Mother        enlarged heart   Diabetes Mother    Hypertension Mother    Hyperlipidemia Mother    Heart disease Mother        heart murmer   Miscarriages / Stillbirths Mother    Breast cancer Mother    Other Brother        enlarged heart; colloid cyst of the third ventricle( of the brain)   Hypertension Sister    Colon cancer Maternal Aunt    Gastric cancer Maternal Uncle    Esophageal cancer Maternal Uncle 71   Stomach cancer Maternal Uncle     Social History   Socioeconomic History   Marital status: Divorced    Spouse name: Not on file   Number of children: 3   Years of education: 14   Highest education level: Not on file  Occupational History   Occupation: Administrator    Comment: [employer]  Tobacco Use   Smoking status: Never   Smokeless tobacco: Never  Vaping Use   Vaping Use: Never used  Substance and Sexual  Activity   Alcohol use: Yes    Comment: occasional   Drug use: Never   Sexual activity: Yes    Birth control/protection: I.U.D.  Other Topics Concern   Not on file  Social History Narrative   Lives with parents   Looking for own place   Tries to walk daily   Social Determinants of Health   Financial Resource Strain: Medium Risk (07/29/2022)   Overall Financial Resource Strain (CARDIA)    Difficulty of Paying Living Expenses: Somewhat hard  Food Insecurity: No Food Insecurity (07/29/2022)   Hunger Vital Sign    Worried About Running Out of Food in the Last Year: Never true    Ran Out of Food in the Last Year: Never true  Transportation Needs: No Transportation Needs (07/29/2022)   PRAPARE - Hydrologist (Medical): No    Lack  of Transportation (Non-Medical): No  Physical Activity: Inactive (07/29/2022)   Exercise Vital Sign    Days of Exercise per Week: 0 days    Minutes of Exercise per Session: 10 min  Stress: Stress Concern Present (07/29/2022)   [place]    Feeling of Stress : Rather much  Social Connections: Moderately Isolated (07/29/2022)   Social Connection and Isolation Panel [NHANES]    Frequency of Communication with Friends and Family: Three times a week    Frequency of Social Gatherings with Friends and Family: More than three times a week    Attends Religious Services: More than 4 times per year    Active Member of [employer] or Organizations: No    Attends Archivist Meetings: Never    Marital Status: Divorced  Human resources officer Violence: Not At Risk (07/29/2022)   Humiliation, Afraid, Rape, and Kick questionnaire    Fear of Current or Ex-Partner: No    Emotionally Abused: No    Physically Abused: No    Sexually Abused: No    Outpatient Medications Prior to Visit  Medication Sig Dispense Refill   EPINEPHrine 0.3 mg/0.3 mL IJ SOAJ injection See admin instructions.     ibuprofen (ADVIL) 800 MG tablet Take 1 tablet (800 mg total) by mouth 3 (three) times daily. Take with food 21 tablet 0   levonorgestrel (MIRENA) 20 MCG/24HR IUD 1 each by Intrauterine route once.     meclizine (ANTIVERT) 50 MG tablet Take 1 every 8-12 hours as needed for dizziness 30 tablet 0   neomycin-polymyxin b-dexamethasone (MAXITROL) 3.5-10000-0.1 SUSP Place 1 drop into the left eye 4 (four) times daily.     ondansetron (ZOFRAN-ODT) 8 MG disintegrating tablet Take 1 tablet (8 mg total) by mouth every 8 (eight) hours as needed for nausea or vomiting. 30 tablet 1   pantoprazole (PROTONIX) 40 MG tablet TAKE 1 TABLET BY MOUTH TWICE DAILY BEFORE A MEAL. 60 tablet 5   Plecanatide (TRULANCE) 3 MG TABS Take 3 mg by mouth daily. 90 tablet 3   predniSONE  (DELTASONE) 10 MG tablet Take 1 tablet (10 mg total) by mouth 2 (two) times daily with a meal. 10 tablet 0   rizatriptan (MAXALT) 10 MG tablet TAKE 1 TABLET BY MOUTH ONCE AT ONSET OF SYMPTOMS, MAY REPEAT DOSE IN 2 HOURS IF NEEDED. 9 tablet 0   topiramate (TOPAMAX) 25 MG tablet Take 1 tablet (25 mg total) by mouth 2 (two) times daily. 60 tablet 1   sertraline (ZOLOFT) 50 MG tablet Take 1 tablet (50 mg total) by mouth  daily. 30 tablet 3   No facility-administered medications prior to visit.    Allergies  Allergen Reactions   Amoxicillin Swelling   Penicillins Swelling   Venlafaxine     Other reaction(s): heart palpitations    ROS Review of Systems  Constitutional:  Negative for fatigue and fever.  Eyes:  Negative for visual disturbance.  Respiratory:  Negative for chest tightness and shortness of breath.   Cardiovascular:  Negative for chest pain and palpitations.  Gastrointestinal:  Negative for diarrhea and vomiting.  Endocrine: Negative for polydipsia, polyphagia and polyuria.  Neurological:  Positive for dizziness. Negative for headaches.      Objective:    Physical Exam HENT:     Head: Normocephalic.     Right Ear: External ear normal.     Left Ear: External ear normal.  Cardiovascular:     Rate and Rhythm: Normal rate and regular rhythm.     Pulses: Normal pulses.     Heart sounds: Normal heart sounds.  Pulmonary:     Effort: Pulmonary effort is normal.     Breath sounds: Normal breath sounds.  Musculoskeletal:     Cervical back: No rigidity.  Neurological:     Mental Status: She is alert.     BP 131/86   Pulse 80   Ht _0  (1.626 m)   Wt 275 lb 1.9 oz (124.8 kg)   SpO2 93%   BMI 47.22 kg/m  Wt Readings from Last 3 Encounters:  09/01/22 275 lb 1.9 oz (124.8 kg)  07/29/22 273 lb 8 oz (124.1 kg)  07/26/22 273 lb 12.8 oz (124.2 kg)    Lab Results  Component Value Date   TSH 2.37 06/23/2017   Lab Results  Component Value Date   WBC 6.0 07/24/2022    HGB 12.9 07/24/2022   HCT 40.6 07/24/2022   MCV 85.8 07/24/2022   PLT 288 07/24/2022   Lab Results  Component Value Date   NA 139 07/24/2022   K 4.0 07/24/2022   CO2 28 07/24/2022   GLUCOSE 106 (H) 07/24/2022   BUN 6 07/24/2022   CREATININE 0.63 07/24/2022   BILITOT 1.1 07/24/2022   ALKPHOS 109 07/24/2022   AST 17 07/24/2022   ALT 15 07/24/2022   PROT 7.2 07/24/2022   ALBUMIN 3.7 07/24/2022   CALCIUM 9.0 07/24/2022   ANIONGAP 6 07/24/2022   Lab Results  Component Value Date   CHOL 158 06/23/2017   Lab Results  Component Value Date   HDL 49 (L) 06/23/2017   Lab Results  Component Value Date   LDLCALC 94 06/23/2017   Lab Results  Component Value Date   TRIG 66 06/23/2017   Lab Results  Component Value Date   CHOLHDL 3.2 06/23/2017   Lab Results  Component Value Date   HGBA1C 5.2 06/22/2021      Assessment & Plan:  Intractable migraine with status migrainosus, unspecified migraine type Assessment & Plan: Referral placed to [facility] neurology Patient reports compliance with Topamax 25 mg twice daily and she takes rizatriptan 10 mg as needed She reports relief of her symptoms with treatment regimen  Orders: -     Ambulatory referral to Neurology  Anxiety and depression Assessment & Plan: She denies suicidal thoughts and ideation She reports that she does not want to be on medications for her anxiety and depression Encourage patient to inform me if having thoughts of suicidal ideation and homicidal ideation   Vertigo Assessment & Plan: Referral was placed to  ENT by her neurologist Encourage patient to continue taking meclizine 50 mg as needed     Follow-up: Return in about 3 months (around 12/01/2022).   Alvira Monday, FNP

## 2022-09-01 NOTE — Assessment & Plan Note (Signed)
Referral was placed to ENT by her neurologist Encourage patient to continue taking meclizine 50 mg as needed

## 2022-09-08 ENCOUNTER — Ambulatory Visit: Payer: BC Managed Care – PPO | Admitting: Family Medicine

## 2022-09-16 ENCOUNTER — Ambulatory Visit (INDEPENDENT_AMBULATORY_CARE_PROVIDER_SITE_OTHER): Payer: BC Managed Care – PPO | Admitting: Family Medicine

## 2022-09-16 ENCOUNTER — Encounter: Payer: Self-pay | Admitting: Family Medicine

## 2022-09-16 DIAGNOSIS — G43909 Migraine, unspecified, not intractable, without status migrainosus: Secondary | ICD-10-CM | POA: Diagnosis not present

## 2022-09-16 DIAGNOSIS — F439 Reaction to severe stress, unspecified: Secondary | ICD-10-CM

## 2022-09-16 NOTE — Progress Notes (Signed)
Virtual Visit via Telephone Note   This visit type was conducted via telephone. This format is felt to be most appropriate for this patient at this time.  The patient did not have access to video technology/had technical difficulties with video requiring transitioning to audio format only (telephone).  All issues noted in this document were discussed and addressed.  No physical exam could be performed with this format.  Evaluation Performed:  Follow-up visit  Date:  09/16/2022   ID:  Kristin [name], DOB October 30, 1980, MRN [id number]  Patient Location: Home Provider Location: Clinic  Participants: Patient Location of Patient: Home Location of Provider: Clinic Consent was obtain for visit to be over via telehealth. I verified that I am speaking with the correct person using two identifiers.  PCP:  Gilmore Laroche, FNP   Chief Complaint: Headaches  History of Present Illness:    Kristin [name] is a 41 y.o. female with history of migraine headaches and is following up with [facility] neurology on 09/23/2022.  She reports migraine headaches all week with a dull, aching headache that worsened 2 days ago.  She reports increased life stresses and fatigue from lack of sleep.  She complains of unilateral left-sided headache, photophobia,  left-sided neck pain, and earache.  She reports taking 2 doses of rizatriptan 10 mg with relief of symptoms.  She has not taken Topamax 25 mg today, 09/16/2022.     The patient does not have symptoms concerning for COVID-19 infection (fever, chills, cough, or new shortness of breath).   Past Medical, Surgical, Social History, Allergies, and Medications have been Reviewed.  Past Medical History:  Diagnosis Date   Allergy    Anxiety    Atypical squamous cell changes of undetermined significance (ASCUS) on cervical cytology with negative high risk human papilloma virus (HPV) test result 03/30/2020   6/21 repeat in 3 years ASCCP guidelines 5 year risk  CIN 3+ 0.27%   Carpal tunnel syndrome of right wrist 10/22/2018   Contraceptive management 11/05/2013   Depression    GERD (gastroesophageal reflux disease)    IUD (intrauterine device) in place 01/13/2016   Migraines    Missed periods 01/13/2016   Vaginal Pap smear, abnormal    Past Surgical History:  Procedure Laterality Date   COLONOSCOPY N/A 05/10/2019   Normal. next colonoscopy 05/2029   ESOPHAGOGASTRODUODENOSCOPY N/A 05/10/2019   Hiatal hernia   NO PAST SURGERIES       Current Meds  Medication Sig   EPINEPHrine 0.3 mg/0.3 mL IJ SOAJ injection See admin instructions.   ibuprofen (ADVIL) 800 MG tablet Take 1 tablet (800 mg total) by mouth 3 (three) times daily. Take with food   levonorgestrel (MIRENA) 20 MCG/24HR IUD 1 each by Intrauterine route once.   meclizine (ANTIVERT) 50 MG tablet Take 1 every 8-12 hours as needed for dizziness   neomycin-polymyxin b-dexamethasone (MAXITROL) 3.5-10000-0.1 SUSP Place 1 drop into the left eye 4 (four) times daily.   ondansetron (ZOFRAN-ODT) 8 MG disintegrating tablet Take 1 tablet (8 mg total) by mouth every 8 (eight) hours as needed for nausea or vomiting.   pantoprazole (PROTONIX) 40 MG tablet TAKE 1 TABLET BY MOUTH TWICE DAILY BEFORE A MEAL.   Plecanatide (TRULANCE) 3 MG TABS Take 3 mg by mouth daily.   predniSONE (DELTASONE) 10 MG tablet Take 1 tablet (10 mg total) by mouth 2 (two) times daily with a meal.   rizatriptan (MAXALT) 10 MG tablet TAKE 1 TABLET BY MOUTH ONCE AT ONSET OF  SYMPTOMS, MAY REPEAT DOSE IN 2 HOURS IF NEEDED.   topiramate (TOPAMAX) 25 MG tablet Take 1 tablet (25 mg total) by mouth 2 (two) times daily.     Allergies:   Amoxicillin, Penicillins, and Venlafaxine   ROS:   Please see the history of present illness.     All other systems reviewed and are negative.   Labs/Other Tests and Data Reviewed:    Recent Labs: 07/24/2022: ALT 15; BUN 6; Creatinine, Ser 0.63; Hemoglobin 12.9; Platelets 288; Potassium 4.0; Sodium 139    Recent Lipid Panel Lab Results  Component Value Date/Time   CHOL 158 06/23/2017 10:49 AM   TRIG 66 06/23/2017 10:49 AM   HDL 49 (L) 06/23/2017 10:49 AM   CHOLHDL 3.2 06/23/2017 10:49 AM   LDLCALC 94 06/23/2017 10:49 AM    Wt Readings from Last 3 Encounters:  09/01/22 275 lb 1.9 oz (124.8 kg)  07/29/22 273 lb 8 oz (124.1 kg)  07/26/22 273 lb 12.8 oz (124.2 kg)     Objective:    Vital Signs:  There were no vitals taken for this visit.     ASSESSMENT & PLAN:   Migraine headaches The patient is experiencing a mixture of migraine headaches with tension headaches Discussed stress management including breathing techniques, exercising, and talk therapy Referral is placed to integrated behavioral integrated health for talk therapy Discussed the importance of adequate sleep Encouraged to continue taking Topamax 25 mg twice daily and rizatriptan's as needed Encouraged to follow-up with her neurologist on 09/23/2022   Time:   Today, I have spent 15 minutes reviewing the chart, including problem list, medications, and with the patient with telehealth technology discussing the above problems.   Medication Adjustments/Labs and Tests Ordered: Current medicines are reviewed at length with the patient today.  Concerns regarding medicines are outlined above.   Tests Ordered: Orders Placed This Encounter  Procedures   Ambulatory referral to Integrated Behavioral Health    Medication Changes: No orders of the defined types were placed in this encounter.    Note: This dictation was prepared with Dragon dictation along with smaller phrase technology. Similar sounding words can be transcribed inadequately or may not be corrected upon review. Any transcriptional errors that result from this process are unintentional.      Disposition:  Follow up  Signed, Gilmore Laroche, FNP  09/16/2022 11:58 AM     [facility]

## 2022-09-22 ENCOUNTER — Ambulatory Visit: Payer: BC Managed Care – PPO | Admitting: Adult Health

## 2022-09-22 NOTE — Progress Notes (Signed)
Referring:  Alvira Monday, [address] [place],  [place] [address]  PCP: Alvira Monday, [place]  Neurology was asked to evaluate Timarie Labell, a 41 year old female for a chief complaint of headaches.  Our recommendations of care will be communicated by shared medical record.    CC:  headaches  History provided from self  HPI:  Medical co-morbidities: GERD  The patient presents for evaluation of headaches which began in middle school. She previously followed with Neurology at [facility], but her provider left and she presents to day to establish care. Headaches are associated with photophobia and nausea. They can last for several hours, even with medication. She currently has one migraine every week. Takes Maxalt as needed for migraines which helps, but makes her drowsy. She started Topamax for migraine prevention one month ago, which has helped somewhat. Tolerates this well without side effects.  She also reports significant neck tension on her left>right side. Has been trying to do stretches at home to help with this.  Headache History: Onset: middle school Triggers: lack of sleep Aura: lights in her peripheral vision Location: retro-orbital, frontal, temporal, neck Associated Symptoms:  Photophobia: yes  Phonophobia: no  Nausea: yes Worse with activity?: yes Duration of headaches: several hours  Migraine days per month: 4 Headache free days per month: 16  Current Treatment: Abortive Maxalt 10 mg PRN  Preventative Topamax 25 mg BID  Prior Therapies                                 Flexeril 10 mg QHS Maxalt 10 mg PRN Imitrex Topamax 25 mg BID Elavil 10 mg QHS Cymbalta 30 mg daily Zoloft 50 gm daily - tremor   LABS: CBC    Component Value Date/Time   WBC 6.0 07/24/2022 1208   RBC 4.73 07/24/2022 1208   HGB 12.9 07/24/2022 1208   HCT 40.6 07/24/2022 1208   PLT 288 07/24/2022 1208   MCV 85.8 07/24/2022 1208   MCH 27.3 07/24/2022 1208   MCHC 31.8  07/24/2022 1208   RDW 12.6 07/24/2022 1208   LYMPHSABS 2,242 04/19/2021 0000   MONOABS 0.5 01/22/2020 0800   EOSABS 89 04/19/2021 0000   BASOSABS 30 04/19/2021 0000      Latest Ref Rng & Units 07/24/2022   12:08 PM 10/26/2021    4:10 PM 04/19/2021   12:00 AM  CMP  Glucose 70 - 99 mg/dL 106     BUN 6 - 20 mg/dL 6     Creatinine 0.44 - 1.00 mg/dL 0.63     Sodium 135 - 145 mmol/L 139     Potassium 3.5 - 5.1 mmol/L 4.0     Chloride 98 - 111 mmol/L 105     CO2 22 - 32 mmol/L 28     Calcium 8.9 - 10.3 mg/dL 9.0     Total Protein 6.5 - 8.1 g/dL 7.2  6.8  6.6   Total Bilirubin 0.3 - 1.2 mg/dL 1.1  0.5  0.7   Alkaline Phos 38 - 126 U/L 109     AST 15 - 41 U/L _0 ALT 0 - 44 U/L _1 IMAGING:  CTA head 12/26/19: unremarkable  MRI brain with contrast 2021: unremarkable  Current Outpatient Medications on File Prior to Visit  Medication Sig Dispense Refill   EPINEPHrine 0.3 mg/0.3  mL IJ SOAJ injection See admin instructions.     ibuprofen (ADVIL) 800 MG tablet Take 1 tablet (800 mg total) by mouth 3 (three) times daily. Take with food 21 tablet 0   levonorgestrel (MIRENA) 20 MCG/24HR IUD 1 each by Intrauterine route once.     meclizine (ANTIVERT) 25 MG tablet Take 25 mg by mouth daily as needed for dizziness.     meclizine (ANTIVERT) 50 MG tablet Take 1 every 8-12 hours as needed for dizziness 30 tablet 0   neomycin-polymyxin b-dexamethasone (MAXITROL) 3.5-10000-0.1 SUSP Place 1 drop into the left eye 4 (four) times daily.     ondansetron (ZOFRAN-ODT) 8 MG disintegrating tablet Take 1 tablet (8 mg total) by mouth every 8 (eight) hours as needed for nausea or vomiting. 30 tablet 1   pantoprazole (PROTONIX) 40 MG tablet TAKE 1 TABLET BY MOUTH TWICE DAILY BEFORE A MEAL. 60 tablet 5   Plecanatide (TRULANCE) 3 MG TABS Take 3 mg by mouth daily. 90 tablet 3   predniSONE (DELTASONE) 10 MG tablet Take 1 tablet (10 mg total) by mouth 2 (two) times daily with a meal. 10 tablet 0    rizatriptan (MAXALT) 10 MG tablet TAKE 1 TABLET BY MOUTH ONCE AT ONSET OF SYMPTOMS, MAY REPEAT DOSE IN 2 HOURS IF NEEDED. 9 tablet 0   No current facility-administered medications on file prior to visit.     Allergies: Allergies  Allergen Reactions   Amoxicillin Swelling   Penicillins Swelling   Venlafaxine     Other reaction(s): heart palpitations    Family History: Family History  Problem Relation Age of Onset   Multiple myeloma Paternal Grandfather    Cancer Paternal Grandmother        leukemia   Colon cancer Maternal Grandmother    Hypertension Father    Hyperlipidemia Father    Cancer Father        prostate   Other Mother        enlarged heart   Diabetes Mother    Hypertension Mother    Hyperlipidemia Mother    Heart disease Mother        heart murmer   Miscarriages / Stillbirths Mother    Breast cancer Mother    Other Brother        enlarged heart; colloid cyst of the third ventricle( of the brain)   Hypertension Sister    Colon cancer Maternal Aunt    Gastric cancer Maternal Uncle    Esophageal cancer Maternal Uncle 71   Stomach cancer Maternal Uncle      Past Medical History: Past Medical History:  Diagnosis Date   Allergy    Anxiety    Atypical squamous cell changes of undetermined significance (ASCUS) on cervical cytology with negative high risk human papilloma virus (HPV) test result 03/30/2020   6/21 repeat in 3 years ASCCP guidelines 5 year risk CIN 3+ 0.27%   Carpal tunnel syndrome of right wrist 10/22/2018   Contraceptive management 11/05/2013   Depression    GERD (gastroesophageal reflux disease)    IUD (intrauterine device) in place 01/13/2016   Migraines    Missed periods 01/13/2016   Vaginal Pap smear, abnormal     Past Surgical History Past Surgical History:  Procedure Laterality Date   COLONOSCOPY N/A 05/10/2019   Normal. next colonoscopy 05/2029   ESOPHAGOGASTRODUODENOSCOPY N/A 05/10/2019   Hiatal hernia   NO PAST SURGERIES       Social History: Social History   Tobacco Use  Smoking status: Never   Smokeless tobacco: Never  Vaping Use   Vaping Use: Never used  Substance Use Topics   Alcohol use: Yes    Comment: occasional   Drug use: Never    ROS: Negative for fevers, chills. Positive for headaches. All other systems reviewed and negative unless stated otherwise in HPI.   Physical Exam:   Vital Signs: BP 128/74   Pulse 81   Ht _0  (1.626 m)   Wt 276 lb (125.2 kg)   BMI 47.38 kg/m  GENERAL: well appearing,in no acute distress,alert SKIN:  Color, texture, turgor normal. No rashes or lesions HEAD:  Normocephalic/atraumatic. CV:  RRR RESP: Normal respiratory effort MSK: +tenderness to palpation over L>R occiput, neck, and shoulders  NEUROLOGICAL: Mental Status: Alert, oriented to person, place and time,Follows commands Cranial Nerves: PERRL, visual fields intact to confrontation, extraocular movements intact, facial sensation intact, no facial droop or ptosis, hearing grossly intact, no dysarthria Motor: muscle strength 5/5 both upper and lower extremities Reflexes: 2+ throughout Sensation: intact to light touch all 4 extremities Coordination: Finger-to- nose-finger intact bilaterally Gait: normal-based   IMPRESSION: 41 year old female with a history of GERD who presents for evaluation of migraines. Topamax has helped somewhat, but she continues to have ~1 migraine per week. Will increase dose up to 50 mg BID. Maxalt works for rescue, but makes her drowsy. Nurtec sample provided today. Will send in a prescription if this is effective. Referral to PT placed for cervicalgia and tension.  PLAN: -Prevention: Increase Topamax to 25/50 x1 week, then to 50 mg BID -Rescue: Continue Maxalt for now. Nurtec sample provided -Referral to neck PT  I spent a total of 23 minutes chart reviewing and counseling the patient. Headache education was done. Discussed treatment options including preventive  and acute medications, and physical therapy. Discussed medication side effects, adverse reactions and drug interactions. Written educational materials and patient instructions outlining all of the above were given.  Follow-up: 6 months   Genia Harold, MD 09/23/2022   9:34 AM

## 2022-09-23 ENCOUNTER — Ambulatory Visit: Payer: BC Managed Care – PPO | Admitting: Psychiatry

## 2022-09-23 VITALS — BP 128/74 | HR 81 | Ht 64.0 in | Wt 276.0 lb

## 2022-09-23 DIAGNOSIS — M542 Cervicalgia: Secondary | ICD-10-CM

## 2022-09-23 DIAGNOSIS — G43119 Migraine with aura, intractable, without status migrainosus: Secondary | ICD-10-CM | POA: Diagnosis not present

## 2022-09-23 MED ORDER — NURTEC 75 MG PO TBDP: 75.0000 mg | ORAL_TABLET | ORAL | 0 refills | Status: DC | PRN

## 2022-09-23 MED ORDER — TOPIRAMATE 25 MG PO TABS: ORAL_TABLET | ORAL | 6 refills | Status: DC

## 2022-09-23 NOTE — Patient Instructions (Signed)
Increase topiramate. Take 1 pill in AM and 2 pills in PM for one week, then increase to 2 pills twice a day   Try Nurtec sample as needed at onset of migraine. Let me know if this effective and I will send in a prescription for you  Referral to physical therapy for the neck

## 2022-09-27 ENCOUNTER — Telehealth: Payer: Self-pay | Admitting: Psychiatry

## 2022-09-27 NOTE — Telephone Encounter (Signed)
Referral for Physical Therapy fax to [facility]. Phone: [phone], Fax: [phone]

## 2022-10-04 ENCOUNTER — Ambulatory Visit: Payer: BC Managed Care – PPO | Admitting: Adult Health

## 2022-10-05 ENCOUNTER — Encounter: Payer: Self-pay | Admitting: Licensed Clinical Social Worker

## 2022-10-09 ENCOUNTER — Ambulatory Visit
Admission: EM | Admit: 2022-10-09 | Discharge: 2022-10-09 | Disposition: A | Discharge status: Home / Self Care | Payer: BC Managed Care – PPO | Attending: Nurse Practitioner | Admitting: Nurse Practitioner

## 2022-10-09 ENCOUNTER — Encounter: Payer: Self-pay | Admitting: Emergency Medicine

## 2022-10-09 DIAGNOSIS — J309 Allergic rhinitis, unspecified: Secondary | ICD-10-CM

## 2022-10-09 DIAGNOSIS — R490 Dysphonia: Secondary | ICD-10-CM | POA: Diagnosis not present

## 2022-10-09 MED ORDER — CETIRIZINE HCL 5 MG/5ML PO SOLN: 2.5000 mg | Freq: Every day | ORAL | 0 refills | Status: DC

## 2022-10-09 MED ORDER — PSEUDOEPH-BROMPHEN-DM 30-2-10 MG/5ML PO SYRP: 5.0000 mL | ORAL_SOLUTION | Freq: Four times a day (QID) | ORAL | 0 refills | Status: DC | PRN

## 2022-10-09 MED ORDER — FLUTICASONE PROPIONATE 50 MCG/ACT NA SUSP: 2.0000 | Freq: Every day | NASAL | 0 refills | Status: DC

## 2022-10-09 NOTE — Discharge Instructions (Signed)
Take medication as prescribed. Increase fluids and allow for plenty of rest. Recommend over-the-counter Tylenol or ibuprofen as needed for pain, fever, or general discomfort. Warm salt water gargles 3-4 times daily while throat symptoms persist. Voice rest when the hoarseness worsens. Recommend using a humidifier in your bedroom at nighttime during sleep and sleeping elevated on pillows while symptoms persist. As discussed, if symptoms do not improve within the next 7 to 10 days or suddenly worsen before that time, please follow-up in this clinic or with your primary care physician for further evaluation. Follow-up as needed.

## 2022-10-09 NOTE — ED Triage Notes (Signed)
Lost voice since 12/31 with runny nose.  Cough and congestion on 1/1.  has been taking mucinex with some relief.

## 2022-10-09 NOTE — ED Provider Notes (Signed)
[facility]    CSN: [id number] Arrival date & time: 10/09/22  1102      History   Chief Complaint No chief complaint on file.   HPI Kristin [name] is a 42 y.o. female.   The history is provided by the patient.   Patient presents with a 1 week history of runny nose, cough, congestion, and hoarseness.  Patient denies fever, chills, headache, wheezing, shortness of breath, or difficulty breathing.  Patient reports that she has noticed increased nasal drainage, and states that her voice is worse in the morning and improves throughout the day.  She reports she has been taking over-the-counter Mucinex with some relief.  Past Medical History:  Diagnosis Date   Allergy    Anxiety    Atypical squamous cell changes of undetermined significance (ASCUS) on cervical cytology with negative high risk human papilloma virus (HPV) test result 03/30/2020   6/21 repeat in 3 years ASCCP guidelines 5 year risk CIN 3+ 0.27%   Carpal tunnel syndrome of right wrist 10/22/2018   Contraceptive management 11/05/2013   Depression    GERD (gastroesophageal reflux disease)    IUD (intrauterine device) in place 01/13/2016   Migraines    Missed periods 01/13/2016   Vaginal Pap smear, abnormal     Patient Active Problem List   Diagnosis Date Noted   Left acute otitis media 07/29/2022   Vertigo 07/29/2022   Nausea 07/29/2022   Anxiety and depression 07/29/2022   Encounter for well woman exam with routine gynecological exam 07/27/2022   Upper back pain on left side 07/27/2022   Morbid obesity ([place]) 07/27/2022   Elevated C-reactive protein (CRP) 04/19/2021   Hepatomegaly 04/19/2021   Hemorrhoids 03/25/2021   Encounter for screening fecal occult blood testing 03/25/2021   Atypical squamous cell changes of undetermined significance (ASCUS) on cervical cytology with negative high risk human papilloma virus (HPV) test result 03/30/2020   Lower abdominal pain 04/19/2019   GERD (gastroesophageal  reflux disease) 03/18/2019   Change in stool habits 03/18/2019   Abnormal CT of the abdomen 03/18/2019   Family planning 10/22/2018   Plantar fasciitis, right 10/22/2018   Carpal tunnel syndrome of right wrist 10/22/2018   IUD (intrauterine device) in place 01/13/2016   Migraines 03/07/2013    Past Surgical History:  Procedure Laterality Date   COLONOSCOPY N/A 05/10/2019   Normal. next colonoscopy 05/2029   ESOPHAGOGASTRODUODENOSCOPY N/A 05/10/2019   Hiatal hernia   NO PAST SURGERIES      OB History     Gravida  3   Para  3   Term  3   Preterm      AB      Living  3      SAB      IAB      Ectopic      Multiple  0   Live Births  3            Home Medications    Prior to Admission medications   Medication Sig Start Date End Date Taking? Authorizing Provider  brompheniramine-pseudoephedrine-DM 30-2-10 MG/5ML syrup Take 5 mLs by mouth 4 (four) times daily as needed. 10/09/22  Yes Taro Hidrogo-Warren, Alda Lea, NP  cetirizine HCl (ZYRTEC) 5 MG/5ML SOLN Take 2.5 mLs (2.5 mg total) by mouth daily. 10/09/22 11/08/22 Yes Felisia Balcom-Warren, Alda Lea, NP  fluticasone (FLONASE) 50 MCG/ACT nasal spray Place 2 sprays into both nostrils daily. 10/09/22  Yes Providencia Hottenstein-Warren, Alda Lea, NP  EPINEPHrine 0.3 mg/0.3 mL IJ  SOAJ injection See admin instructions. 08/24/20   [provider]  ibuprofen (ADVIL) 800 MG tablet Take 1 tablet (800 mg total) by mouth 3 (three) times daily. Take with food 07/24/22   Triplett, Tammy, PA-C  levonorgestrel (MIRENA) 20 MCG/24HR IUD 1 each by Intrauterine route once.    [provider]  meclizine (ANTIVERT) 25 MG tablet Take 25 mg by mouth daily as needed for dizziness. 07/29/22   [provider]  meclizine (ANTIVERT) 50 MG tablet Take 1 every 8-12 hours as needed for dizziness 07/29/22   Cyril Mourning A, NP  neomycin-polymyxin b-dexamethasone (MAXITROL) 3.5-10000-0.1 SUSP Place 1 drop into the left eye 4 (four) times daily.  08/29/22   [provider]  ondansetron (ZOFRAN-ODT) 8 MG disintegrating tablet Take 1 tablet (8 mg total) by mouth every 8 (eight) hours as needed for nausea or vomiting. 07/29/22   Adline Potter, NP  pantoprazole (PROTONIX) 40 MG tablet TAKE 1 TABLET BY MOUTH TWICE DAILY BEFORE A MEAL. 11/30/21   Gelene Mink, NP  Plecanatide (TRULANCE) 3 MG TABS Take 3 mg by mouth daily. 06/01/22   Rourk, Gerrit Friends, MD  predniSONE (DELTASONE) 10 MG tablet Take 1 tablet (10 mg total) by mouth 2 (two) times daily with a meal. 08/19/22   Kerri Perches, MD  Rimegepant Sulfate (NURTEC) 75 MG TBDP Take 75 mg by mouth as needed. 09/23/22   Ocie Doyne, MD  rizatriptan (MAXALT) 10 MG tablet TAKE 1 TABLET BY MOUTH ONCE AT ONSET OF SYMPTOMS, MAY REPEAT DOSE IN 2 HOURS IF NEEDED. 01/15/18   Eustace Moore, MD  topiramate (TOPAMAX) 25 MG tablet Take 1 pill in AM and 2 pills in PM for one week, then increase to 2 pills twice a day 09/23/22   Ocie Doyne, MD    Family History Family History  Problem Relation Age of Onset   Multiple myeloma Paternal Grandfather    Cancer Paternal Grandmother        leukemia   Colon cancer Maternal Grandmother    Hypertension Father    Hyperlipidemia Father    Cancer Father        prostate   Other Mother        enlarged heart   Diabetes Mother    Hypertension Mother    Hyperlipidemia Mother    Heart disease Mother        heart murmer   Miscarriages / Stillbirths Mother    Breast cancer Mother    Other Brother        enlarged heart; colloid cyst of the third ventricle( of the brain)   Hypertension Sister    Colon cancer Maternal Aunt    Gastric cancer Maternal Uncle    Esophageal cancer Maternal Uncle 36   Stomach cancer Maternal Uncle     Social History Social History   Tobacco Use   Smoking status: Never   Smokeless tobacco: Never  Vaping Use   Vaping Use: Never used  Substance Use Topics   Alcohol use: Yes    Comment: occasional    Drug use: Never     Allergies   Amoxicillin, Penicillins, and Venlafaxine   Review of Systems Review of Systems Per HPI  Physical Exam Triage Vital Signs ED Triage Vitals  Enc Vitals Group     BP 10/09/22 1225 137/84     Pulse Rate 10/09/22 1225 74     Resp 10/09/22 1225 18     Temp 10/09/22 1225 98.6 F (  37 C)     Temp Source 10/09/22 1225 Oral     SpO2 10/09/22 1225 98 %     Weight --      Height --      Head Circumference --      Peak Flow --      Pain Score 10/09/22 1227 0     Pain Loc --      Pain Edu? --      Excl. in GC? --    No data found.  Updated Vital Signs BP 137/84 (BP Location: Right Arm)   Pulse 74   Temp 98.6 F (37 C) (Oral)   Resp 18   SpO2 98%   Visual Acuity Right Eye Distance:   Left Eye Distance:   Bilateral Distance:    Right Eye Near:   Left Eye Near:    Bilateral Near:     Physical Exam Vitals and nursing note reviewed.  Constitutional:      General: She is not in acute distress.    Appearance: Normal appearance.  HENT:     Head: Normocephalic.     Right Ear: Tympanic membrane, ear canal and external ear normal.     Left Ear: Tympanic membrane, ear canal and external ear normal.     Nose: Congestion present. No rhinorrhea.     Mouth/Throat:     Mouth: Mucous membranes are moist.     Pharynx: Posterior oropharyngeal erythema present.     Comments: Cobblestoning present on oropharynx Eyes:     Extraocular Movements: Extraocular movements intact.     Conjunctiva/sclera: Conjunctivae normal.     Pupils: Pupils are equal, round, and reactive to light.  Cardiovascular:     Rate and Rhythm: Normal rate and regular rhythm.     Pulses: Normal pulses.     Heart sounds: Normal heart sounds.  Pulmonary:     Effort: Pulmonary effort is normal. No respiratory distress.     Breath sounds: Normal breath sounds. No stridor. No wheezing, rhonchi or rales.  Abdominal:     General: Bowel sounds are normal.     Palpations: Abdomen is  soft.  Musculoskeletal:     Cervical back: Normal range of motion.  Lymphadenopathy:     Cervical: No cervical adenopathy.  Skin:    General: Skin is warm and dry.  Neurological:     General: No focal deficit present.     Mental Status: She is alert and oriented to person, place, and time.  Psychiatric:        Mood and Affect: Mood normal.        Behavior: Behavior normal.      UC Treatments / Results  Labs (all labs ordered are listed, but only abnormal results are displayed) Labs Reviewed - No data to display  EKG   Radiology No results found.  Procedures Procedures (including critical care time)  Medications Ordered in UC Medications - No data to display  Initial Impression / Assessment and Plan / UC Course  I have reviewed the triage vital signs and the nursing notes.  Pertinent labs & imaging results that were available during my care of the patient were reviewed by me and considered in my medical decision making (see chart for details).  Patient is well-appearing, she is in no acute distress, vital signs are stable.  Suspect allergic rhinitis at this time.  Suspect postnasal drainage from same is causing patient's hoarseness and throat irritation.  Will treat patient symptomatically with  Bromfed-DM for her cough and congestion, cetirizine 10 mg for her nasal congestion, and fluticasone 50 mcg nasal spray for her nasal congestion.  Supportive care recommendations were provided to the patient, along with discussing return precautions.  Patient verbalizes understanding.  All questions were answered.  Patient stable for discharge.   Final Clinical Impressions(s) / UC Diagnoses   Final diagnoses:  Allergic rhinitis, unspecified seasonality, unspecified trigger  Hoarseness of voice     Discharge Instructions      Take medication as prescribed. Increase fluids and allow for plenty of rest. Recommend over-the-counter Tylenol or ibuprofen as needed for pain,  fever, or general discomfort. Warm salt water gargles 3-4 times daily while throat symptoms persist. Voice rest when the hoarseness worsens. Recommend using a humidifier in your bedroom at nighttime during sleep and sleeping elevated on pillows while symptoms persist. As discussed, if symptoms do not improve within the next 7 to 10 days or suddenly worsen before that time, please follow-up in this clinic or with your primary care physician for further evaluation. Follow-up as needed.     ED Prescriptions     Medication Sig Dispense Auth. Provider   brompheniramine-pseudoephedrine-DM 30-2-10 MG/5ML syrup Take 5 mLs by mouth 4 (four) times daily as needed. 140 mL Magdalynn Davilla-Warren, Sadie Haber, NP   fluticasone (FLONASE) 50 MCG/ACT nasal spray Place 2 sprays into both nostrils daily. 16 g Correne Lalani-Warren, Sadie Haber, NP   cetirizine HCl (ZYRTEC) 5 MG/5ML SOLN Take 2.5 mLs (2.5 mg total) by mouth daily. 75 mL Billie Intriago-Warren, Sadie Haber, NP      PDMP not reviewed this encounter.   Abran Cantor, NP 10/09/22 1304

## 2022-10-10 ENCOUNTER — Ambulatory Visit: Payer: BC Managed Care – PPO | Admitting: Adult Health

## 2022-10-12 ENCOUNTER — Ambulatory Visit (INDEPENDENT_AMBULATORY_CARE_PROVIDER_SITE_OTHER): Payer: BC Managed Care – PPO | Admitting: Licensed Clinical Social Worker

## 2022-10-12 ENCOUNTER — Encounter: Payer: Self-pay | Admitting: Internal Medicine

## 2022-10-12 DIAGNOSIS — F439 Reaction to severe stress, unspecified: Secondary | ICD-10-CM

## 2022-10-13 ENCOUNTER — Encounter: Payer: Self-pay | Admitting: Internal Medicine

## 2022-10-13 ENCOUNTER — Telehealth (INDEPENDENT_AMBULATORY_CARE_PROVIDER_SITE_OTHER): Payer: BC Managed Care – PPO | Admitting: Internal Medicine

## 2022-10-13 DIAGNOSIS — J029 Acute pharyngitis, unspecified: Secondary | ICD-10-CM

## 2022-10-13 MED ORDER — AZITHROMYCIN 250 MG PO TABS: ORAL_TABLET | ORAL | 0 refills | Status: AC

## 2022-10-13 NOTE — Progress Notes (Addendum)
Virtual Visit via Video Note   Because of Kristin [name]'s co-morbid illnesses, she is at least at moderate risk for complications without adequate follow up.  This format is felt to be most appropriate for this patient at this time.  All issues noted in this document were discussed and addressed.  A limited physical exam was performed with this format.    Evaluation Performed:  Follow-up visit  Date:  10/13/2022   ID:  Kristin [name], DOB July 07, 1981, MRN [id number]  Patient Location: Home Provider Location: Office/Clinic  Participants: Patient Location of Patient: Home Location of Provider: Telehealth Consent was obtain for visit to be over via telehealth. I verified that I am speaking with the correct person using two identifiers.  PCP:  Alvira Monday, FNP   Chief Complaint: Sore throat  History of Present Illness:    Kristin [name] is a 42 y.o. female who has a video visit for complaint of sore throat, nasal congestion and cough for for the last 10 days.  Her sore throat is worse in the morning.  She has also noticed hoarse voice and is not able to speak in the morning.  She denies any fever or chills.  She went to urgent care on 10/07/21, and was given Bromfed, Flonase and Zyrtec.  She has tried them without much relief.  She denies any dyspnea or wheezing currently.  The patient does not have symptoms concerning for COVID-19 infection (fever, chills, cough, or new shortness of breath).   Past Medical, Surgical, Social History, Allergies, and Medications have been Reviewed.  Past Medical History:  Diagnosis Date   Allergy    Anxiety    Atypical squamous cell changes of undetermined significance (ASCUS) on cervical cytology with negative high risk human papilloma virus (HPV) test result 03/30/2020   6/21 repeat in 3 years ASCCP guidelines 5 year risk CIN 3+ 0.27%   Carpal tunnel syndrome of right wrist 10/22/2018   Contraceptive management 11/05/2013   Depression     GERD (gastroesophageal reflux disease)    IUD (intrauterine device) in place 01/13/2016   Migraines    Missed periods 01/13/2016   Vaginal Pap smear, abnormal    Past Surgical History:  Procedure Laterality Date   COLONOSCOPY N/A 05/10/2019   Normal. next colonoscopy 05/2029   ESOPHAGOGASTRODUODENOSCOPY N/A 05/10/2019   Hiatal hernia   NO PAST SURGERIES       No outpatient medications have been marked as taking for the 10/13/22 encounter (Video Visit) with Lindell Spar, MD.     Allergies:   Amoxicillin, Penicillins, and Venlafaxine   ROS:   Please see the history of present illness.     All other systems reviewed and are negative.   Labs/Other Tests and Data Reviewed:    Recent Labs: 07/24/2022: ALT 15; BUN 6; Creatinine, Ser 0.63; Hemoglobin 12.9; Platelets 288; Potassium 4.0; Sodium 139   Recent Lipid Panel Lab Results  Component Value Date/Time   CHOL 158 06/23/2017 10:49 AM   TRIG 66 06/23/2017 10:49 AM   HDL 49 (L) 06/23/2017 10:49 AM   CHOLHDL 3.2 06/23/2017 10:49 AM   LDLCALC 94 06/23/2017 10:49 AM    Wt Readings from Last 3 Encounters:  09/23/22 276 lb (125.2 kg)  09/01/22 275 lb 1.9 oz (124.8 kg)  07/29/22 273 lb 8 oz (124.1 kg)     ASSESSMENT & PLAN:    Acute pharyngitis Her symptoms are likely due to acute pharyngitis She might have had viral infection leading  to secondary bacterial infection Started empiric azithromycin Advised to perform warm water gargling Use humidifier at nighttime to avoid dry air exposure   I discussed the assessment and treatment plan with the patient. The patient was provided an opportunity to ask questions, and all were answered. The patient agreed with the plan and demonstrated an understanding of the instructions.   The patient was advised to call back or seek an in-person evaluation if the symptoms worsen or if the condition fails to improve as anticipated.  The above assessment and management plan was discussed with  the patient. The patient verbalized understanding of and has agreed to the management plan.   Medication Adjustments/Labs and Tests Ordered: Current medicines are reviewed at length with the patient today.  Concerns regarding medicines are outlined above.   Tests Ordered: No orders of the defined types were placed in this encounter.   Medication Changes: No orders of the defined types were placed in this encounter.    Note: This dictation was prepared with Dragon dictation along with smaller phrase technology. Similar sounding words can be transcribed inadequately or may not be corrected upon review. Any transcriptional errors that result from this process are unintentional.      Disposition:  Follow up  Signed, Lindell Spar, MD  10/13/2022 1:49 PM     [facility]

## 2022-10-13 NOTE — Patient Instructions (Signed)
Please take Azithromycin as prescribed.  Please perform warm water gargling as needed for sore throat and at nighttime.  Please use humidifier at nighttime.

## 2022-10-13 NOTE — Addendum Note (Signed)
Addended by: Jacqulyne Gladue on: 10/13/2022 02:35 PM   Modules accepted: Level of Service  

## 2022-10-13 NOTE — BH Specialist Note (Signed)
Integrated Behavioral Health via Telemedicine Visit  10/13/2022 Salene Mohamud [id number]  Number of [place] Clinician visits: 1 Session Start time:  3:30pm Session End time: 3:57pm Total time in minutes: 27 mins via mychart video   Referring Provider: Verne Grain NP Patient/Family location: Home  [facility] Provider location: [place]  All persons participating in visit: Pt M Mullen and LCSW A Lorma Heater  Types of Service: Individual psychotherapy and Video visit  I connected with [name] and/or Sephira Vu's n/a via  Telephone or Video Enabled Telemedicine Application  (Video is Caregility application) and verified that I am speaking with the correct person using two identifiers. Discussed confidentiality: No   I discussed the limitations of telemedicine and the availability of in person appointments.  Discussed there is a possibility of technology failure and discussed alternative modes of communication if that failure occurs.  I discussed that engaging in this telemedicine visit, they consent to the provision of behavioral healthcare and the services will be billed under their insurance.  Patient and/or legal guardian expressed understanding and consented to Telemedicine visit: No   Presenting Concerns: Patient and/or family reports the following symptoms/concerns: stress at home  Duration of problem: 2019; Severity of problem: mild  Patient and/or Family's Strengths/Protective Factors: Concrete supports in place (healthy food, safe environments, etc.)  Goals Addressed: Patient will:  Reduce symptoms of: stress   Increase knowledge and/or ability of: stress reduction   Demonstrate ability to: Increase healthy adjustment to current life circumstances  Progress towards Goals: Ongoing  Interventions: Interventions utilized:  Motivational Interviewing and Supportive Counseling Standardized Assessments completed: Not Needed  Patient and/or  Family Response: Ms. Manders reports increase stress due lack of support. 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 Father of children is not helpful and cause more strain on Ms. Dusseau   Assessment: Patient currently experiencing stress at home .   Patient may benefit from integrated behavioral health.  Plan: Follow up with behavioral health clinician on : 3 weeks  Behavioral recommendations: communicate needs extended trustworthy family for additional support, create a routinue and delegate task  Referral(s): [place] (In Clinic)  I discussed the assessment and treatment plan with the patient and/or parent/guardian. They were provided an opportunity to ask questions and all were answered. They agreed with the plan and demonstrated an understanding of the instructions.   They were advised to call back or seek an in-person evaluation if the symptoms worsen or if the condition fails to improve as anticipated.  Lynnea Ferrier, LCSW

## 2022-10-16 ENCOUNTER — Encounter: Payer: Self-pay | Admitting: Psychiatry

## 2022-10-18 ENCOUNTER — Ambulatory Visit (INDEPENDENT_AMBULATORY_CARE_PROVIDER_SITE_OTHER): Payer: BC Managed Care – PPO | Admitting: Adult Health

## 2022-10-18 ENCOUNTER — Encounter: Payer: Self-pay | Admitting: Adult Health

## 2022-10-18 VITALS — BP 127/88 | HR 67 | Ht 64.0 in | Wt 271.2 lb

## 2022-10-18 DIAGNOSIS — F419 Anxiety disorder, unspecified: Secondary | ICD-10-CM

## 2022-10-18 DIAGNOSIS — F32A Depression, unspecified: Secondary | ICD-10-CM | POA: Diagnosis not present

## 2022-10-18 DIAGNOSIS — Z975 Presence of (intrauterine) contraceptive device: Secondary | ICD-10-CM | POA: Diagnosis not present

## 2022-10-18 NOTE — Progress Notes (Signed)
  Subjective:     Patient ID: Kristin [name], female   DOB: 12/22/1980, 42 y.o.   MRN: [id number]  HPI Kristin [name] is a 42 year old black female, divorced, [id number] back in follow up on anxiety and depression, and she stopped taking Zoloft before Christmas felt really tired, while taking it. She started therapy last week. She has is a Z pack now.  Last pap was negative HPV NILM 03/25/21.  PCP is Alvira Monday NP.  Review of Systems +anxiety and depression  Reviewed past medical,surgical, social and family history. Reviewed medications and allergies.     Objective:   Physical Exam BP 127/88 (BP Location: Left Arm, Patient Position: Sitting, Cuff Size: Normal)   Pulse 67   Ht 5\' 4"  (1.626 m)   Wt 271 lb 3.2 oz (123 kg)   BMI 46.55 kg/m  Skin warm and dry. Lungs: clear to ausculation bilaterally. Cardiovascular: regular rate and rhythm.    Fall risk is low    10/18/2022    9:44 AM 09/16/2022   10:10 AM 09/01/2022    4:02 PM  Depression screen PHQ 2/9  Decreased Interest 1 0 2  Down, Depressed, Hopeless 1 0 1  PHQ - 2 Score 2 0 3  Altered sleeping 3 0 0  Tired, decreased energy 3 0 2  Change in appetite 1 0 2  Feeling bad or failure about yourself  0 0 1  Trouble concentrating 0 0 2  Moving slowly or fidgety/restless 1 0 1  Suicidal thoughts 0 0 0  PHQ-9 Score 10 0 11  Difficult doing work/chores  Not difficult at all Not difficult at all       10/18/2022    9:45 AM 09/16/2022   10:10 AM 09/01/2022    4:03 PM 08/19/2022    2:50 PM  GAD 7 : Generalized Anxiety Score  Nervous, Anxious, on Edge 2 0 2 1  Control/stop worrying 1 0 2 1  Worry too much - different things 0 0 2 1  Trouble relaxing 1 0 2 2  Restless 1 0 2 1  Easily annoyed or irritable 1 0 2 1  Afraid - awful might happen 1 0 3 2  Total GAD 7 Score 7 0 15 9  Anxiety Difficulty  Not difficult at all Somewhat difficult Somewhat difficult      Upstream - 10/18/22 0932       Pregnancy Intention Screening    Does the patient want to become pregnant in the next year? No    Does the patient's partner want to become pregnant in the next year? No    Would the patient like to discuss contraceptive options today? No      Contraception Wrap Up   Current Method IUD or IUS    End Method IUD or IUS    Contraception Counseling Provided No             Assessment:     1. Anxiety and depression She declines meds Going to therapy now   2. IUD (intrauterine device) in place Mirena placed 07/30/15     Plan:     Return in 9 months, may want IUD replaced, or may want tubal and ablation, handout on ablation given If wants tubal and ablation, will make MD appt.

## 2022-10-21 ENCOUNTER — Ambulatory Visit
Admission: RE | Admit: 2022-10-21 | Discharge: 2022-10-21 | Disposition: A | Discharge status: Home / Self Care | Payer: BC Managed Care – PPO | Source: Ambulatory Visit | Attending: Nurse Practitioner | Admitting: Nurse Practitioner

## 2022-10-21 VITALS — BP 147/91 | HR 80 | Temp 98.5°F | Resp 18

## 2022-10-21 DIAGNOSIS — U071 COVID-19: Secondary | ICD-10-CM

## 2022-10-21 MED ORDER — PAXLOVID (300/100) 20 X 150 MG & 10 X 100MG PO TBPK: 3.0000 | ORAL_TABLET | Freq: Two times a day (BID) | ORAL | 0 refills | Status: AC

## 2022-10-21 NOTE — ED Provider Notes (Signed)
[facility]    CSN: [id number] Arrival date & time: 10/21/22  0906      History   Chief Complaint Chief Complaint  Patient presents with   Fever    + COVID Chills, body aches, sneezing and runny nose - Entered by patient   Appointment    0900    HPI Kristin [name] is a 42 y.o. female.   Patient presents today for 1 day history of low-grade fever, congested and dry cough, shortness of breath and nasal congestion, runny nose, right-sided ear pain, nausea without vomiting, decreased appetite, and fatigue.  She denies chest pain, chest tightness, sore throat, headache, abdominal pain, vomiting, and diarrhea.  No known sick contacts.  Reports she tested positive for COVID-19 at home this morning.  Has been drinking more water and taking over-the-counter/previously prescribed medications with some benefit.  Reports that she has been sick since the beginning of January, was recently treated with azithromycin and allergy meds.  Has taken over the as prescribed.    Past Medical History:  Diagnosis Date   Allergy    Anxiety    Atypical squamous cell changes of undetermined significance (ASCUS) on cervical cytology with negative high risk human papilloma virus (HPV) test result 03/30/2020   6/21 repeat in 3 years ASCCP guidelines 5 year risk CIN 3+ 0.27%   Carpal tunnel syndrome of right wrist 10/22/2018   Contraceptive management 11/05/2013   Depression    Encounter for well woman exam with routine gynecological exam 07/27/2022   GERD (gastroesophageal reflux disease)    IUD (intrauterine device) in place 01/13/2016   Migraines    Missed periods 01/13/2016   Vaginal Pap smear, abnormal     Patient Active Problem List   Diagnosis Date Noted   Left acute otitis media 07/29/2022   Vertigo 07/29/2022   Nausea 07/29/2022   Anxiety and depression 07/29/2022   Encounter for well woman exam with routine gynecological exam 07/27/2022   Upper back pain on left side  07/27/2022   Morbid obesity ([place]) 07/27/2022   Elevated C-reactive protein (CRP) 04/19/2021   Hepatomegaly 04/19/2021   Hemorrhoids 03/25/2021   Encounter for screening fecal occult blood testing 03/25/2021   Atypical squamous cell changes of undetermined significance (ASCUS) on cervical cytology with negative high risk human papilloma virus (HPV) test result 03/30/2020   Lower abdominal pain 04/19/2019   GERD (gastroesophageal reflux disease) 03/18/2019   Change in stool habits 03/18/2019   Abnormal CT of the abdomen 03/18/2019   Family planning 10/22/2018   Plantar fasciitis, right 10/22/2018   Carpal tunnel syndrome of right wrist 10/22/2018   IUD (intrauterine device) in place 01/13/2016   Migraines 03/07/2013    Past Surgical History:  Procedure Laterality Date   COLONOSCOPY N/A 05/10/2019   Normal. next colonoscopy 05/2029   ESOPHAGOGASTRODUODENOSCOPY N/A 05/10/2019   Hiatal hernia   NO PAST SURGERIES      OB History     Gravida  3   Para  3   Term  3   Preterm      AB      Living  3      SAB      IAB      Ectopic      Multiple  0   Live Births  3            Home Medications    Prior to Admission medications   Medication Sig Start Date End Date Taking? Authorizing Provider  nirmatrelvir & ritonavir (PAXLOVID, 300/100,) 20 x 150 MG & 10 x 100MG  TBPK Take 3 tablets by mouth 2 (two) times daily for 5 days. 10/21/22 10/26/22 Yes Eulogio Bear, NP  cetirizine HCl (ZYRTEC) 5 MG/5ML SOLN Take 2.5 mLs (2.5 mg total) by mouth daily. 10/09/22 11/08/22  Leath-Warren, Alda Lea, NP  EPINEPHrine 0.3 mg/0.3 mL IJ SOAJ injection See admin instructions. Patient not taking: Reported on 10/18/2022 08/24/20   [provider]  fluticasone (FLONASE) 50 MCG/ACT nasal spray Place 2 sprays into both nostrils daily. Patient not taking: Reported on 10/18/2022 10/09/22   Leath-Warren, Alda Lea, NP  levonorgestrel (MIRENA) 20 MCG/24HR IUD 1 each by Intrauterine route  once.    [provider]  pantoprazole (PROTONIX) 40 MG tablet TAKE 1 TABLET BY MOUTH TWICE DAILY BEFORE A MEAL. 11/30/21   Annitta Needs, NP  Plecanatide (TRULANCE) 3 MG TABS Take 3 mg by mouth daily. Patient not taking: Reported on 10/18/2022 06/01/22   Daneil Dolin, MD  rizatriptan (MAXALT) 10 MG tablet TAKE 1 TABLET BY MOUTH ONCE AT ONSET OF SYMPTOMS, MAY REPEAT DOSE IN 2 HOURS IF NEEDED. 01/15/18   Raylene Everts, MD  topiramate (TOPAMAX) 25 MG tablet Take 1 pill in AM and 2 pills in PM for one week, then increase to 2 pills twice a day 09/23/22   Genia Harold, MD    Family History Family History  Problem Relation Age of Onset   Multiple myeloma Paternal Grandfather    Cancer Paternal Grandmother        leukemia   Colon cancer Maternal Grandmother    Hypertension Father    Hyperlipidemia Father    Cancer Father        prostate   Other Mother        enlarged heart   Diabetes Mother    Hypertension Mother    Hyperlipidemia Mother    Heart disease Mother        heart murmer   Miscarriages / Stillbirths Mother    Breast cancer Mother    Other Brother        enlarged heart; colloid cyst of the third ventricle( of the brain)   Hypertension Sister    Colon cancer Maternal Aunt    Gastric cancer Maternal Uncle    Esophageal cancer Maternal Uncle 71   Stomach cancer Maternal Uncle     Social History Social History   Tobacco Use   Smoking status: Never   Smokeless tobacco: Never  Vaping Use   Vaping Use: Never used  Substance Use Topics   Alcohol use: Yes    Comment: occasional   Drug use: Never     Allergies   Amoxicillin, Penicillins, and Venlafaxine   Review of Systems Review of Systems Per HPI  Physical Exam Triage Vital Signs ED Triage Vitals  Enc Vitals Group     BP 10/21/22 0933 (!) 147/91     Pulse Rate 10/21/22 0933 80     Resp 10/21/22 0933 18     Temp 10/21/22 0933 98.5 F (36.9 C)     Temp Source 10/21/22 0933 Oral     SpO2  10/21/22 0933 97 %     Weight --      Height --      Head Circumference --      Peak Flow --      Pain Score 10/21/22 0936 0     Pain Loc --      Pain  Edu? --      Excl. in GC? --    No data found.  Updated Vital Signs BP (!) 147/91 (BP Location: Right Wrist)   Pulse 80   Temp 98.5 F (36.9 C) (Oral)   Resp 18   SpO2 97%   Visual Acuity Right Eye Distance:   Left Eye Distance:   Bilateral Distance:    Right Eye Near:   Left Eye Near:    Bilateral Near:     Physical Exam Vitals and nursing note reviewed.  Constitutional:      General: She is not in acute distress.    Appearance: Normal appearance. She is not ill-appearing or toxic-appearing.  HENT:     Head: Normocephalic and atraumatic.     Right Ear: Tympanic membrane, ear canal and external ear normal.     Left Ear: Tympanic membrane, ear canal and external ear normal.     Nose: Congestion present. No rhinorrhea.     Mouth/Throat:     Mouth: Mucous membranes are moist.     Pharynx: Oropharynx is clear. Posterior oropharyngeal erythema present. No oropharyngeal exudate.  Eyes:     General: No scleral icterus.    Extraocular Movements: Extraocular movements intact.  Cardiovascular:     Rate and Rhythm: Normal rate and regular rhythm.  Pulmonary:     Effort: Pulmonary effort is normal. No respiratory distress.     Breath sounds: Normal breath sounds. No wheezing, rhonchi or rales.  Abdominal:     General: Abdomen is flat. Bowel sounds are normal. There is no distension.     Palpations: Abdomen is soft.     Tenderness: There is no abdominal tenderness.  Musculoskeletal:     Cervical back: Normal range of motion and neck supple.  Lymphadenopathy:     Cervical: No cervical adenopathy.  Skin:    General: Skin is warm and dry.     Coloration: Skin is not jaundiced or pale.     Findings: No erythema or rash.  Neurological:     Mental Status: She is alert and oriented to person, place, and time.  Psychiatric:         Behavior: Behavior is cooperative.      UC Treatments / Results  Labs (all labs ordered are listed, but only abnormal results are displayed) Labs Reviewed - No data to display  EKG   Radiology No results found.  Procedures Procedures (including critical care time)  Medications Ordered in UC Medications - No data to display  Initial Impression / Assessment and Plan / UC Course  I have reviewed the triage vital signs and the nursing notes.  Pertinent labs & imaging results that were available during my care of the patient were reviewed by me and considered in my medical decision making (see chart for details).   Patient is well-appearing, normotensive, afebrile, not tachycardic, not tachypneic, oxygenating well on room air.    COVID-19 Symptoms are consistent with COVID-19 Patient had a positive at-home test, will treat with Paxlovid twice daily for 5 days Last GFR greater than 60 in October 2023 No contraindication in medications, discussed that may make Mirena less effective and recommended backup contraception Other supportive care discussed Note given for work ER and return precautions discussed with patient  The patient was given the opportunity to ask questions.  All questions answered to their satisfaction.  The patient is in agreement to this plan.    Final Clinical Impressions(s) / UC Diagnoses   Final  diagnoses:  COVID-19     Discharge Instructions      Your symptoms are consistent with COVID-19 today.  We are treating you with Paxlovid since you tested positive at home.  Please isolate at home for the next 4 days, you can return to work after 5 days of symptoms starting as long as your symptoms are improving and you wear a mask while at work.  Some things that can make you feel better are: - Increased rest - Increasing fluid with water/sugar free electrolytes - Acetaminophen and ibuprofen as needed for fever/pain - Salt water gargling,  chloraseptic spray and throat lozenges - OTC guaifenesin (Mucinex) 600 mg twice daily for congestion - Saline sinus flushes or a neti pot for congestion - Humidifying the air     ED Prescriptions     Medication Sig Dispense Auth. Provider   nirmatrelvir & ritonavir (PAXLOVID, 300/100,) 20 x 150 MG & 10 x 100MG  TBPK Take 3 tablets by mouth 2 (two) times daily for 5 days. 30 tablet Eulogio Bear, NP      PDMP not reviewed this encounter.   Eulogio Bear, NP 10/21/22 [phone]

## 2022-10-21 NOTE — ED Triage Notes (Signed)
Pt reports chills, body aches, sneezing and runny nose since 10/09/22; fatigue, body aches, chills x 1 day . Pt finished Z-pk on 10/19/22 without relief.   Pt had a positie COVID test today.

## 2022-10-21 NOTE — Discharge Instructions (Signed)
Your symptoms are consistent with COVID-19 today.  We are treating you with Paxlovid since you tested positive at home.  Please isolate at home for the next 4 days, you can return to work after 5 days of symptoms starting as long as your symptoms are improving and you wear a mask while at work.  Some things that can make you feel better are: - Increased rest - Increasing fluid with water/sugar free electrolytes - Acetaminophen and ibuprofen as needed for fever/pain - Salt water gargling, chloraseptic spray and throat lozenges - OTC guaifenesin (Mucinex) 600 mg twice daily for congestion - Saline sinus flushes or a neti pot for congestion - Humidifying the air

## 2022-10-25 ENCOUNTER — Encounter: Payer: Self-pay | Admitting: Cardiovascular Disease

## 2022-10-27 ENCOUNTER — Encounter: Payer: Self-pay | Admitting: *Deleted

## 2022-10-27 ENCOUNTER — Telehealth: Payer: Self-pay | Admitting: Internal Medicine

## 2022-10-27 ENCOUNTER — Other Ambulatory Visit: Payer: Self-pay

## 2022-10-27 DIAGNOSIS — R7989 Other specified abnormal findings of blood chemistry: Secondary | ICD-10-CM

## 2022-10-27 DIAGNOSIS — R160 Hepatomegaly, not elsewhere classified: Secondary | ICD-10-CM | POA: Diagnosis not present

## 2022-10-27 NOTE — Telephone Encounter (Signed)
Pt sent a Mychart message to schedule FU OV and also asked if she could have a lab order sent to [facility] .

## 2022-10-27 NOTE — Telephone Encounter (Signed)
Spoke to pt, informed her that labs were faxed to [place]. She informed me that she went to [facility] already and had labs done.

## 2022-10-28 LAB — HEPATIC FUNCTION PANEL
AG Ratio: 1.4 (calc) (ref 1.0–2.5)
ALT: 10 U/L (ref 6–29)
AST: 11 U/L (ref 10–30)
Albumin: 4.2 g/dL (ref 3.6–5.1)
Alkaline phosphatase (APISO): 103 U/L (ref 31–125)
Bilirubin, Direct: 0.1 mg/dL (ref 0.0–0.2)
Globulin: 3 g/dL (calc) (ref 1.9–3.7)
Indirect Bilirubin: 0.6 mg/dL (calc) (ref 0.2–1.2)
Total Bilirubin: 0.7 mg/dL (ref 0.2–1.2)
Total Protein: 7.2 g/dL (ref 6.1–8.1)

## 2022-10-30 ENCOUNTER — Encounter: Payer: Self-pay | Admitting: Family Medicine

## 2022-10-31 ENCOUNTER — Emergency Department (HOSPITAL_COMMUNITY): Payer: BC Managed Care – PPO

## 2022-10-31 ENCOUNTER — Encounter: Payer: Self-pay | Admitting: Physician Assistant

## 2022-10-31 ENCOUNTER — Ambulatory Visit (INDEPENDENT_AMBULATORY_CARE_PROVIDER_SITE_OTHER): Payer: BC Managed Care – PPO | Admitting: Physician Assistant

## 2022-10-31 ENCOUNTER — Ambulatory Visit (INDEPENDENT_AMBULATORY_CARE_PROVIDER_SITE_OTHER): Payer: BC Managed Care – PPO

## 2022-10-31 ENCOUNTER — Emergency Department (HOSPITAL_COMMUNITY)
Admission: EM | Admit: 2022-10-31 | Discharge: 2022-10-31 | Disposition: A | Discharge status: Home / Self Care | Payer: BC Managed Care – PPO | Attending: Emergency Medicine | Admitting: Emergency Medicine

## 2022-10-31 ENCOUNTER — Other Ambulatory Visit: Payer: Self-pay

## 2022-10-31 ENCOUNTER — Other Ambulatory Visit
Admission: RE | Admit: 2022-10-31 | Discharge: 2022-10-31 | Disposition: A | Discharge status: Home / Self Care | Payer: BC Managed Care – PPO | Source: Ambulatory Visit | Attending: Physician Assistant | Admitting: Physician Assistant

## 2022-10-31 ENCOUNTER — Telehealth: Payer: Self-pay | Admitting: Physician Assistant

## 2022-10-31 ENCOUNTER — Encounter (HOSPITAL_COMMUNITY): Payer: Self-pay | Admitting: *Deleted

## 2022-10-31 VITALS — BP 122/80 | HR 86 | Ht 64.0 in | Wt 272.0 lb

## 2022-10-31 DIAGNOSIS — R0609 Other forms of dyspnea: Secondary | ICD-10-CM | POA: Diagnosis not present

## 2022-10-31 DIAGNOSIS — R0602 Shortness of breath: Secondary | ICD-10-CM | POA: Diagnosis not present

## 2022-10-31 DIAGNOSIS — R002 Palpitations: Secondary | ICD-10-CM

## 2022-10-31 DIAGNOSIS — I493 Ventricular premature depolarization: Secondary | ICD-10-CM

## 2022-10-31 DIAGNOSIS — R600 Localized edema: Secondary | ICD-10-CM

## 2022-10-31 DIAGNOSIS — R0789 Other chest pain: Secondary | ICD-10-CM | POA: Insufficient documentation

## 2022-10-31 DIAGNOSIS — I491 Atrial premature depolarization: Secondary | ICD-10-CM | POA: Insufficient documentation

## 2022-10-31 LAB — CBC
HCT: 38.4 % (ref 36.0–46.0)
Hemoglobin: 12.5 g/dL (ref 12.0–15.0)
MCH: 27.1 pg (ref 26.0–34.0)
MCHC: 32.6 g/dL (ref 30.0–36.0)
MCV: 83.3 fL (ref 80.0–100.0)
Platelets: 261 10*3/uL (ref 150–400)
RBC: 4.61 MIL/uL (ref 3.87–5.11)
RDW: 13 % (ref 11.5–15.5)
WBC: 6.6 10*3/uL (ref 4.0–10.5)
nRBC: 0 % (ref 0.0–0.2)

## 2022-10-31 LAB — BASIC METABOLIC PANEL
Anion gap: 8 (ref 5–15)
BUN: 7 mg/dL (ref 6–20)
CO2: 24 mmol/L (ref 22–32)
Calcium: 8.8 mg/dL — ABNORMAL LOW (ref 8.9–10.3)
Chloride: 105 mmol/L (ref 98–111)
Creatinine, Ser: 0.6 mg/dL (ref 0.44–1.00)
GFR, Estimated: >60 mL/min (ref >60)
Glucose, Bld: 99 mg/dL (ref 70–99)
Potassium: 3.7 mmol/L (ref 3.5–5.1)
Sodium: 137 mmol/L (ref 135–145)

## 2022-10-31 LAB — MAGNESIUM: Magnesium: 2 mg/dL (ref 1.7–2.4)

## 2022-10-31 LAB — TSH: TSH: 1.514 u[IU]/mL (ref 0.350–4.500)

## 2022-10-31 LAB — D-DIMER, QUANTITATIVE: D-Dimer, Quant: 0.52 ug/mL-FEU — ABNORMAL HIGH (ref 0.00–0.50)

## 2022-10-31 MED ORDER — ENOXAPARIN SODIUM 120 MG/0.8ML IJ SOSY
120.0000 mg | PREFILLED_SYRINGE | Freq: Once | INTRAMUSCULAR | Status: AC
  Administered 2022-10-31: 120 mg via SUBCUTANEOUS
  Filled 2022-10-31: qty 0.8

## 2022-10-31 MED ORDER — IOHEXOL 350 MG/ML SOLN
75.0000 mL | Freq: Once | INTRAVENOUS | Status: AC | PRN
  Administered 2022-10-31: 75 mL via INTRAVENOUS

## 2022-10-31 NOTE — Progress Notes (Addendum)
Cardiology Office Note    Date:  10/31/2022   ID:  [name], DOB [birth year], MRN [id number]  PCP:  Alvira Monday, FNP  Cardiologist:  Jenkins Rouge, MD  Electrophysiologist:  None   Chief Complaint: palpitations  History of Present Illness:   Kristin [name] is a 42 y.o. female with history of GERD, anxiety, depression, migraines, palpitations with rare PACs/PVCs presents for evaluation of palpitations. She remotely saw Dr. Johnsie Cancel in the past with atypical chest pain felt related to anxiety and palpitations with monitor 2021 avg HR 77, <1% PACs/PVCs.  She comes in for evaluation of palpitations, also noting SOB and leg pain. She tested positive for Covid on 1/17 and was prescribed Paxlovid after an urgent care visit. She completed this on Wednesday of last week and initially felt better while on the course but after discontinuation of Paxlovid developed fatigue, worsening dyspnea on exertion, and uptick in palpitations about 8-10x a day described as skipping. HR fluctuates marginally during palpitations but no tachycardia noted during episodes. They are short lived, seconds to minutes. She also notes lower left aching that started about 3 days ago. She does note her DOE is slightly improved today still notices it is more noticeable than her prior baseline. She is nontoxic appearing. She has rare unchanged short shooting chest pains that have not increased in intensity.  Labwork independently reviewed: 10/2022 LFTs ok 07/2022 upreg neg, trop neg, d-dimer neg, CBC OK, CMET Ok, TSH wnl  Past History   Past Medical History:  Diagnosis Date   Allergy    Anxiety    Atypical squamous cell changes of undetermined significance (ASCUS) on cervical cytology with negative high risk human papilloma virus (HPV) test result 03/30/2020   6/21 repeat in 3 years ASCCP guidelines 5 year risk CIN 3+ 0.27%   Carpal tunnel syndrome of right wrist 10/22/2018   Contraceptive management 11/05/2013    Depression    Encounter for well woman exam with routine gynecological exam 07/27/2022   GERD (gastroesophageal reflux disease)    IUD (intrauterine device) in place 01/13/2016   Migraines    Missed periods 01/13/2016   Vaginal Pap smear, abnormal     Past Surgical History:  Procedure Laterality Date   COLONOSCOPY N/A 05/10/2019   Normal. next colonoscopy 05/2029   ESOPHAGOGASTRODUODENOSCOPY N/A 05/10/2019   Hiatal hernia   NO PAST SURGERIES      Current Medications: Current Meds  Medication Sig   EPINEPHrine 0.3 mg/0.3 mL IJ SOAJ injection See admin instructions.   levonorgestrel (MIRENA) 20 MCG/24HR IUD 1 each by Intrauterine route once.   pantoprazole (PROTONIX) 40 MG tablet TAKE 1 TABLET BY MOUTH TWICE DAILY BEFORE A MEAL.   Plecanatide (TRULANCE) 3 MG TABS Take 3 mg by mouth daily.   rizatriptan (MAXALT) 10 MG tablet TAKE 1 TABLET BY MOUTH ONCE AT ONSET OF SYMPTOMS, MAY REPEAT DOSE IN 2 HOURS IF NEEDED.      Allergies:   Amoxicillin, Nurtec [rimegepant sulfate], Penicillins, and Venlafaxine   Social History   Socioeconomic History   Marital status: Divorced    Spouse name: Not on file   Number of children: 3   Years of education: 14   Highest education level: Not on file  Occupational History   Occupation: program assistant    Comment: [employer]  Tobacco Use   Smoking status: Never   Smokeless tobacco: Never  Vaping Use   Vaping Use: Never used  Substance and Sexual Activity   Alcohol use:  Not Currently    Comment: occasional   Drug use: Never   Sexual activity: Yes    Birth control/protection: I.U.D.  Other Topics Concern   Not on file  Social History Narrative   Lives with parents   Looking for own place   Tries to walk daily   Social Determinants of Health   Financial Resource Strain: Medium Risk (07/29/2022)   Overall Financial Resource Strain (CARDIA)    Difficulty of Paying Living Expenses: Somewhat hard  Food Insecurity: No Food Insecurity  (07/29/2022)   Hunger Vital Sign    Worried About Running Out of Food in the Last Year: Never true    Ran Out of Food in the Last Year: Never true  Transportation Needs: No Transportation Needs (07/29/2022)   PRAPARE - Administrator, Civil Service (Medical): No    Lack of Transportation (Non-Medical): No  Physical Activity: Inactive (07/29/2022)   Exercise Vital Sign    Days of Exercise per Week: 0 days    Minutes of Exercise per Session: 10 min  Stress: Stress Concern Present (07/29/2022)   [employer] of Occupational Health - Occupational Stress Questionnaire    Feeling of Stress : Rather much  Social Connections: Moderately Isolated (07/29/2022)   Social Connection and Isolation Panel [NHANES]    Frequency of Communication with Friends and Family: Three times a week    Frequency of Social Gatherings with Friends and Family: More than three times a week    Attends Religious Services: More than 4 times per year    Active Member of [employer] or Organizations: No    Attends Engineer, structural: Never    Marital Status: Divorced     Family History:  The patient's family history includes Breast cancer in her mother; Cancer in her father and paternal grandmother; Colon cancer in her maternal aunt and maternal grandmother; Diabetes in her mother; Esophageal cancer (age of onset: 84) in her maternal uncle; Gastric cancer in her maternal uncle; Heart disease in her mother; Hyperlipidemia in her father and mother; Hypertension in her father, mother, and sister; Miscarriages / Stillbirths in her mother; Multiple myeloma in her paternal grandfather; Other in her brother and mother; Stomach cancer in her maternal uncle.  ROS:   Please see the history of present illness.  All other systems are reviewed and otherwise negative.    EKG(s)/Additional Testing   EKG:  EKG is ordered today, personally reviewed, demonstrating NSR 86bpm, no acute STT changes  CV Studies:  Cardiac studies reviewed are outlined and summarized above. Otherwise please see EMR for full report.  Recent Labs: 07/24/2022: BUN 6; Creatinine, Ser 0.63; Hemoglobin 12.9; Platelets 288; Potassium 4.0; Sodium 139 10/27/2022: ALT 10  Recent Lipid Panel    Component Value Date/Time   CHOL 158 06/23/2017 1049   TRIG 66 06/23/2017 1049   HDL 49 (L) 06/23/2017 1049   CHOLHDL 3.2 06/23/2017 1049   LDLCALC 94 06/23/2017 1049    PHYSICAL EXAM:    VS:  BP 122/80   Pulse 86   Ht 5\' 4"  (1.626 m)   Wt 272 lb (123.4 kg)   SpO2 99%   BMI 46.69 kg/m   BMI: Body mass index is 46.69 kg/m.  GEN: Well nourished, well developed female in no acute distress HEENT: normocephalic, atraumatic Neck: no JVD, carotid bruits, or masses Cardiac: RRR; no murmurs, rubs, or gallops, no edema  Respiratory:  clear to auscultation bilaterally, normal work of breathing GI: soft, nontender,  nondistended, + BS MS: no deformity or atrophy Skin: warm and dry, no rash Neuro:  Alert and Oriented x 3, Strength and sensation are intact, follows commands Psych: euthymic mood, full affect  Wt Readings from Last 3 Encounters:  10/31/22 272 lb (123.4 kg)  10/18/22 271 lb 3.2 oz (123 kg)  09/23/22 276 lb (125.2 kg)     ASSESSMENT & PLAN:   1. Palpitations, prior hx of PACs/PVCs - she has noticed an uptick post-Covid treatment. EKG shows NSR today. Per our discussion will arrange 7 day non live Zio for evaluation. Check labs/lytes and TSH today.  2. Dyspnea on exertion - she reports a chronic history of DOE attributed to weight but more noticeable since Covid diagnosis and completing Paxlovid. Lungs are clear and she is nontoxic appearing. Vitals are stable. She is also complaining of left leg aching that started about 3 days ago. It is prudent to obtain d-dimer. If this is abnormal, recommend proceed to ER for PE/DVT rule out. If negative, will plan to order 2D echocardiogram. Since dyspnea seems to have improved  today, hold off CXR, but can consider if SOB continues and CTA is not pursued - recommend early f/u with PCP post-Covid to review.  3. Atypical chest pain - no accelerating features recently. Await echocardiogram. Will consider cor CTA based on workup above for evaluation of DOE.    Disposition: F/u with me or APP in 6 weeks.   Medication Adjustments/Labs and Tests Ordered: Current medicines are reviewed at length with the patient today.  Concerns regarding medicines are outlined above. Medication changes, Labs and Tests ordered today are summarized above and listed in the Patient Instructions accessible in Encounters.    Signed, Charlie Pitter, PA-C  10/31/2022 3:36 PM    [facility] Location in [place]. [place], [place] [address] Ph: [phone]; Fax [phone]

## 2022-10-31 NOTE — ED Provider Notes (Signed)
[facility] Provider Note   CSN: [id number] Arrival date & time: 10/31/22  1715     History  Chief Complaint  Patient presents with   Shortness of Breath    Covid 1/17, elevated d-dimer today    Kristin [name] is a 42 y.o. female.   Shortness of Breath Patient presents with shortness of breath.  Sent from cardiology.  Had COVID infection around 12 days ago.  Had Paxlovid.  Started to feel worse after the Paxlovid.  Has some mild crampiness in the left leg.  Also more shortness of breath.  Seen by cardiology and had a positive D-dimer that was mildly elevated and sent in for further evaluation.  Patient is somewhat tearful on my evaluation.  States she is somewhat anxious about this.    Past Medical History:  Diagnosis Date   Allergy    Anxiety    Atypical squamous cell changes of undetermined significance (ASCUS) on cervical cytology with negative high risk human papilloma virus (HPV) test result 03/30/2020   6/21 repeat in 3 years ASCCP guidelines 5 year risk CIN 3+ 0.27%   Carpal tunnel syndrome of right wrist 10/22/2018   Contraceptive management 11/05/2013   Depression    Encounter for well woman exam with routine gynecological exam 07/27/2022   GERD (gastroesophageal reflux disease)    IUD (intrauterine device) in place 01/13/2016   Migraines    Missed periods 01/13/2016   Vaginal Pap smear, abnormal     Home Medications Prior to Admission medications   Medication Sig Start Date End Date Taking? Authorizing Provider  cetirizine HCl (ZYRTEC) 5 MG/5ML SOLN Take 2.5 mLs (2.5 mg total) by mouth daily. Patient not taking: Reported on 10/31/2022 10/09/22 11/08/22  Leath-Warren, Alda Lea, NP  EPINEPHrine 0.3 mg/0.3 mL IJ SOAJ injection See admin instructions. 08/24/20   [provider]  fluticasone (FLONASE) 50 MCG/ACT nasal spray Place 2 sprays into both nostrils daily. Patient not taking: Reported on 10/31/2022 10/09/22    Leath-Warren, Alda Lea, NP  levonorgestrel (MIRENA) 20 MCG/24HR IUD 1 each by Intrauterine route once.    [provider]  pantoprazole (PROTONIX) 40 MG tablet TAKE 1 TABLET BY MOUTH TWICE DAILY BEFORE A MEAL. 11/30/21   Annitta Needs, NP  Plecanatide (TRULANCE) 3 MG TABS Take 3 mg by mouth daily. 06/01/22   Rourk, Cristopher Estimable, MD  rizatriptan (MAXALT) 10 MG tablet TAKE 1 TABLET BY MOUTH ONCE AT ONSET OF SYMPTOMS, MAY REPEAT DOSE IN 2 HOURS IF NEEDED. 01/15/18   Raylene Everts, MD  topiramate (TOPAMAX) 25 MG tablet Take 1 pill in AM and 2 pills in PM for one week, then increase to 2 pills twice a day Patient not taking: Reported on 10/31/2022 09/23/22   Genia Harold, MD      Allergies    Amoxicillin, Nurtec [rimegepant sulfate], Penicillins, and Venlafaxine    Review of Systems   Review of Systems  Respiratory:  Positive for shortness of breath.     Physical Exam Updated Vital Signs BP 120/76 (BP Location: Left Arm)   Pulse 84   Temp 98.3 F (36.8 C) (Oral)   Resp 20   Ht 5\' 4"  (1.626 m)   Wt 123.4 kg   SpO2 100%   BMI 46.69 kg/m  Physical Exam Vitals and nursing note reviewed.  Cardiovascular:     Rate and Rhythm: Regular rhythm. Tachycardia present.  Pulmonary:     Breath sounds: No wheezing or  rhonchi.  Chest:     Chest wall: Tenderness present.  Musculoskeletal:     Comments: Mild tenderness to left calf without erythema.  Mild edema bilaterally.  Skin:    Capillary Refill: Capillary refill takes less than 2 seconds.  Neurological:     Mental Status: She is alert.     ED Results / Procedures / Treatments   Labs (all labs ordered are listed, but only abnormal results are displayed) Labs Reviewed - No data to display  EKG EKG Interpretation  Date/Time:  Monday October 31 2022 17:33:50 EST Ventricular Rate:  105 PR Interval:  152 QRS Duration: 84 QT Interval:  334 QTC Calculation: 441 R Axis:   79 Text Interpretation: Sinus tachycardia  Otherwise normal ECG When compared with ECG of 24-Jul-2022 11:28, No significant change was found Confirmed by Davonna Belling [phone]) on 10/31/2022 6:41:27 PM  Radiology CT Angio Chest PE W and/or Wo Contrast  Result Date: 10/31/2022 CLINICAL DATA:  SOB EXAM: CT ANGIOGRAPHY CHEST WITH CONTRAST TECHNIQUE: Multidetector CT imaging of the chest was performed using the standard protocol during bolus administration of intravenous contrast. Multiplanar CT image reconstructions and MIPs were obtained to evaluate the vascular anatomy. RADIATION DOSE REDUCTION: This exam was performed according to the departmental dose-optimization program which includes automated exposure control, adjustment of the mA and/or kV according to patient size and/or use of iterative reconstruction technique. CONTRAST:  75 mL OMNIPAQUE IOHEXOL 350 MG/ML SOLN COMPARISON:  None Available. FINDINGS: Cardiovascular: Satisfactory opacification of the pulmonary arteries to the segmental level. No evidence of pulmonary embolism. Normal heart size. No pericardial effusion. Mediastinum/Nodes: No enlarged mediastinal, hilar, or axillary lymph nodes. Thyroid gland, trachea, and esophagus demonstrate no significant findings. Lungs/Pleura: Lungs are clear. No pleural effusion or pneumothorax. Upper Abdomen: No acute abnormality. Musculoskeletal: No chest wall abnormality. No acute or significant osseous findings. Multilevel thoracic degenerative changes with disc space narrowing and marginal osteophytes. Review of the MIP images confirms the above findings. IMPRESSION: Unremarkable examination with no evidence of PE, aneurysm or dissection. Electronically Signed   By: Sammie Bench M.D.   On: 10/31/2022 18:15    Procedures Procedures    Medications Ordered in ED Medications  iohexol (OMNIPAQUE) 350 MG/ML injection 75 mL (75 mLs Intravenous Contrast Given 10/31/22 1800)  enoxaparin (LOVENOX) injection 120 mg (120 mg Subcutaneous Given 10/31/22  2024)    ED Course/ Medical Decision Making/ A&P                             Medical Decision Making Amount and/or Complexity of Data Reviewed Radiology: ordered.  Risk Prescription drug management.   Patient with shortness of breath and leg pain.  Recent COVID infection.  This puts her at higher risk for PE.  However did have positive D-dimer as an outpatient.  Will get CT angiography to evaluate the chest and a Doppler to evaluate leg.  Reviewed note from cardiology and blood work from cardiologist office.  CT angiography independently interpreted shows no PE.  However ultrasound is already left and cannot get Doppler today.  Will treat with Lovenox and have Doppler done for leg swelling tomorrow.  Appears stable for outpatient follow-up.        Final Clinical Impression(s) / ED Diagnoses Final diagnoses:  Shortness of breath  Leg edema, left    Rx / DC Orders ED Discharge Orders          Ordered  US Venous Img Lower Unilateral Left        10/31/22 Thayer [place], MD 10/31/22 2316

## 2022-10-31 NOTE — Patient Instructions (Signed)
Medication Instructions:  Your physician recommends that you continue on your current medications as directed. Please refer to the Current Medication list given to you today.  *If you need a refill on your cardiac medications before your next appointment, please call your pharmacy*   Lab Work: Your physician recommends that you return for lab work in: Today    If you have labs (blood work) drawn today and your tests are completely normal, you will receive your results only by: MyChart Message (if you have MyChart) OR A paper copy in the mail If you have any lab test that is abnormal or we need to change your treatment, we will call you to review the results.   Testing/Procedures: Your physician has requested that you have an echocardiogram. Echocardiography is a painless test that uses sound waves to create images of your heart. It provides your doctor with information about the size and shape of your heart and how well your heart's chambers and valves are working. This procedure takes approximately one hour. There are no restrictions for this procedure. Please do NOT wear cologne, perfume, aftershave, or lotions (deodorant is allowed). Please arrive 15 minutes prior to your appointment time.  ZIO XT- Long Term Monitor Instructions   Your physician has requested you wear your ZIO patch monitor_______days.   This is a single patch monitor.  [employer] supplies one patch monitor per enrollment.  Additional stickers are not available.   Please do not apply patch if you will be having a Nuclear Stress Test, Echocardiogram, Cardiac CT, MRI, or Chest Xray during the time frame you would be wearing the monitor. The patch cannot be worn during these tests.  You cannot remove and re-apply the ZIO XT patch monitor.   Your ZIO patch monitor will be sent USPS Priority mail from [facility] directly to your home address. The monitor may also be mailed to a PO BOX if home delivery is not  available.   It may take 3-5 days to receive your monitor after you have been enrolled.   Once you have received you monitor, please review enclosed instructions.  Your monitor has already been registered assigning a specific monitor serial # to you.   Applying the monitor   Shave hair from upper left chest.   Hold abrader disc by orange tab.  Rub abrader in 40 strokes over left upper chest as indicated in your monitor instructions.   Clean area with 4 enclosed alcohol pads .  Use all pads to assure are is cleaned thoroughly.  Let dry.   Apply patch as indicated in monitor instructions.  Patch will be place under collarbone on left side of chest with arrow pointing upward.   Rub patch adhesive wings for 2 minutes.Remove white label marked "1".  Remove white label marked "2".  Rub patch adhesive wings for 2 additional minutes.   While looking in a mirror, press and release button in center of patch.  A small green light will flash 3-4 times .  This will be your only indicator the monitor has been turned on.     Do not shower for the first 24 hours.  You may shower after the first 24 hours.   Press button if you feel a symptom. You will hear a small click.  Record Date, Time and Symptom in the Patient Log Book.   When you are ready to remove patch, follow instructions on last 2 pages of Patient Log Book.  Stick patch monitor onto  last page of Patient Log Book.   Place Patient Log Book in [place] box.  Use locking tab on box and tape box closed securely.  The [employer] has [employer] on it.  Please place in mailbox as soon as possible.  Your physician should have your test results approximately 7 days after the monitor has been mailed back to [facility].   Call [place] at [phone] if you have questions regarding your ZIO XT patch monitor.  Call them immediately if you see an orange light blinking on your monitor.   If your monitor falls off in less  than 4 days contact our Monitor department at [phone].  If your monitor becomes loose or falls off after 4 days call [employer] at [phone] for suggestions on securing your monitor.     Follow-Up: At [facility], you and your health needs are our priority.  As part of our continuing mission to provide you with exceptional heart care, we have created designated Provider Care Teams.  These Care Teams include your primary Cardiologist (physician) and Advanced Practice Providers (APPs -  Physician Assistants and Nurse Practitioners) who all work together to provide you with the care you need, when you need it.  We recommend signing up for the patient portal called "MyChart".  Sign up information is provided on this After Visit Summary.  MyChart is used to connect with patients for Virtual Visits (Telemedicine).  Patients are able to view lab/test results, encounter notes, upcoming appointments, etc.  Non-urgent messages can be sent to your provider as well.   To learn more about what you can do with MyChart, go to [email or web address].    Your next appointment:   6 week(s)  Provider:   You will see one of the following Advanced Practice Providers on your designated Care Team:   Melina Copa, PA-C   Ermalinda Barrios, PA-C    Other Instructions Thank you for choosing [facility]!

## 2022-10-31 NOTE — Telephone Encounter (Signed)
Appt schedule 01.30.2024

## 2022-10-31 NOTE — ED Triage Notes (Signed)
Pt sent here due to elevated d-dimer, pt with recent Covid 1/17 and started on 1/18.  Pt with SOB and fatigued after finishing Paxlovid.  Pt with left leg pain as well.

## 2022-10-31 NOTE — Telephone Encounter (Signed)
Pt notified that she should be seen in the ER. Pt voiced understanding.

## 2022-10-31 NOTE — Telephone Encounter (Signed)
Please let patient know that d-dimer is marginally elevated. It's in a range that I am hopeful her testing would be negative but unfortunately cannot be 100% ruled out. Recommend proceeding to ER for evaluation of possible DVT/PE. They may also need to do a CXR to exclude any post-Covid abnormalities. We'll keep the plan for echo like we discussed. I'll follow her chart for any additional recommendations. Recommend she tell them that she just recently had Covid and now has DOE/left leg pain. Thank you!

## 2022-10-31 NOTE — Discharge Instructions (Signed)
Follow-up tomorrow for the ultrasound.

## 2022-11-01 ENCOUNTER — Telehealth: Payer: Self-pay

## 2022-11-01 ENCOUNTER — Encounter: Payer: Self-pay | Admitting: Family Medicine

## 2022-11-01 ENCOUNTER — Ambulatory Visit (HOSPITAL_COMMUNITY)
Admission: RE | Admit: 2022-11-01 | Discharge: 2022-11-01 | Disposition: A | Discharge status: Home / Self Care | Payer: BC Managed Care – PPO | Source: Ambulatory Visit | Attending: Emergency Medicine | Admitting: Emergency Medicine

## 2022-11-01 ENCOUNTER — Telehealth: Payer: Self-pay | Admitting: Gastroenterology

## 2022-11-01 ENCOUNTER — Ambulatory Visit: Payer: BC Managed Care – PPO | Admitting: Family Medicine

## 2022-11-01 VITALS — BP 124/78 | HR 83 | Ht 64.0 in | Wt 267.1 lb

## 2022-11-01 DIAGNOSIS — Z09 Encounter for follow-up examination after completed treatment for conditions other than malignant neoplasm: Secondary | ICD-10-CM

## 2022-11-01 DIAGNOSIS — R600 Localized edema: Secondary | ICD-10-CM | POA: Insufficient documentation

## 2022-11-01 DIAGNOSIS — M79662 Pain in left lower leg: Secondary | ICD-10-CM | POA: Diagnosis not present

## 2022-11-01 DIAGNOSIS — F411 Generalized anxiety disorder: Secondary | ICD-10-CM

## 2022-11-01 DIAGNOSIS — Z79899 Other long term (current) drug therapy: Secondary | ICD-10-CM

## 2022-11-01 MED ORDER — RIZATRIPTAN BENZOATE 10 MG PO TABS: ORAL_TABLET | ORAL | 11 refills | Status: DC

## 2022-11-01 MED ORDER — POTASSIUM CHLORIDE CRYS ER 20 MEQ PO TBCR: 20.0000 meq | EXTENDED_RELEASE_TABLET | Freq: Every day | ORAL | 6 refills | Status: DC

## 2022-11-01 MED ORDER — BUSPIRONE HCL 5 MG PO TABS: 5.0000 mg | ORAL_TABLET | Freq: Two times a day (BID) | ORAL | 0 refills | Status: DC

## 2022-11-01 NOTE — Telephone Encounter (Signed)
Results discussed with patient.She asks for only 30 day supply Potassium 20 meq be sent to her pharmacy. She will repeat BMET on 11/16/21,pcp copied.

## 2022-11-01 NOTE — Telephone Encounter (Signed)
Patient already seen Juanda Crumble, [place] Direct Dial [phone]

## 2022-11-01 NOTE — Telephone Encounter (Signed)
-----  Message from Charlie Pitter, [place] sent at 11/01/2022 11:07 AM EST ----- Please let pt know aside from that d-dimer, her labs looked otherwise OK. I'm grateful her ER workup did not show any blood clots. Potassium slightly below goal for where we like it with history of skipped beats - prefer to keep it 4.0 or greater. Hers was 3.7. Recommend starting KCl 75meq daily with recheck BMET when she returns for her echocardiogram on 2/14. Calcium level 0.1 pt below normal but can just follow when she comes in for repeat labs.

## 2022-11-01 NOTE — Telephone Encounter (Signed)
Patient left a message but didn't say why she was calling on to say to return her call.  I did call back but left a message

## 2022-11-01 NOTE — Patient Instructions (Signed)
Follow-up with PCP Kristin [name] in 2 to 3 weeks to reevaluate generalized anxiety.  Call if you need to be seen sooner.  New is buspar 5 mg one twice daily, if you become extreme;ly anxious as though you are having a panic attack , take half of a buspar tablet at that time, not to exceed more than twice per week  I will add to the referral to ENT that you should be evaluated for hoarseness which has developed in the past 4 to 6 weeks per your report.  Thankful that lung testing is negative for any clot in the lung or in the leg.  I do believe that you will benefit significantly follow-up treatment of your mental health and encourage you to continue with therapy and keep all options open for medication management including treatment through psychiatry if needed.  Weight loss will happen, so continue the journey, good food choice and exercise  Thanks for choosing [facility], we consider it a privelige to serve you.

## 2022-11-03 ENCOUNTER — Ambulatory Visit (INDEPENDENT_AMBULATORY_CARE_PROVIDER_SITE_OTHER): Payer: BC Managed Care – PPO | Admitting: Licensed Clinical Social Worker

## 2022-11-03 DIAGNOSIS — F439 Reaction to severe stress, unspecified: Secondary | ICD-10-CM

## 2022-11-03 DIAGNOSIS — F32A Depression, unspecified: Secondary | ICD-10-CM | POA: Diagnosis not present

## 2022-11-03 DIAGNOSIS — F419 Anxiety disorder, unspecified: Secondary | ICD-10-CM

## 2022-11-03 NOTE — BH Specialist Note (Signed)
Integrated Behavioral Health via Telemedicine Visit  11/03/2022 Kristin [name] [id number]  Number of Integrated Behavioral Health Clinician visits: 2 Session Start time: 3:46pm  Session End time: 4:15pm Total time in minutes: 30 mins via mychart video   Referring Provider: Verne Grain NP  Patient/Family location: Work  [facility] Provider location: [place]  All persons participating in visit: Pt Kristin [name] and LCSW A Kashay Cavenaugh  Types of Service: Individual psychotherapy and Video visit  I connected with Kristin [name] and/or Kristin [name]'s n/a via  Telephone or Video Enabled Telemedicine Application  (Video is Caregility application) and verified that I am speaking with the correct person using two identifiers. Discussed confidentiality: Yes   I discussed the limitations of telemedicine and the availability of in person appointments.  Discussed there is a possibility of technology failure and discussed alternative modes of communication if that failure occurs.  I discussed that engaging in this telemedicine visit, they consent to the provision of behavioral healthcare and the services will be billed under their insurance.  Patient and/or legal guardian expressed understanding and consented to Telemedicine visit: Yes   Presenting Concerns: Patient and/or family reports the following symptoms/concerns: stress  Duration of problem: over one year ; Severity of problem: mild  Patient and/or Family's Strengths/Protective Factors: Concrete supports in place (healthy food, safe environments, etc.)  Goals Addressed: Patient will:  Reduce symptoms of: stress   Increase knowledge and/or ability of: coping skills and stress reduction   Demonstrate ability to: Increase healthy adjustment to current life circumstances  Progress towards Goals: Ongoing  Interventions: Interventions utilized:  Motivational Interviewing and Supportive Counseling Standardized Assessments completed: Not  Needed  Patient and/or Family Response: Kristin [name] responded well to mychart visit. Kristin [name] and LCSW A Linton Rump set goals for the next 14 days for self care, physical activity and reducing stress.   Assessment: Patient currently experiencing anxiety and stress.   Patient may benefit from integrated behavioral health.  Plan: Follow up with behavioral health clinician on : 11/17/2022 Behavioral recommendations: Take lunch breaks out of office such as walking around parking for physical activity, being intentional about stress reducing activity and communicating needs with family for added support Referral(s): [place] (In Clinic)  I discussed the assessment and treatment plan with the patient and/or parent/guardian. They were provided an opportunity to ask questions and all were answered. They agreed with the plan and demonstrated an understanding of the instructions.   They were advised to call back or seek an in-person evaluation if the symptoms worsen or if the condition fails to improve as anticipated.  Lynnea Ferrier, LCSW

## 2022-11-04 ENCOUNTER — Ambulatory Visit: Payer: BC Managed Care – PPO | Admitting: Cardiology

## 2022-11-04 ENCOUNTER — Encounter: Payer: Self-pay | Admitting: Family Medicine

## 2022-11-04 DIAGNOSIS — Z09 Encounter for follow-up examination after completed treatment for conditions other than malignant neoplasm: Secondary | ICD-10-CM | POA: Insufficient documentation

## 2022-11-04 DIAGNOSIS — F411 Generalized anxiety disorder: Secondary | ICD-10-CM | POA: Insufficient documentation

## 2022-11-04 NOTE — Progress Notes (Signed)
   Kristin [name]     MRN: [id number]      DOB: 1980/10/24   HPI Kristin [name] is here with c/o shortness of breath and ED follow up of same complaint She is being evaluated by Cardiology for palpitations and  was seen in the office there on 1/29, and sent from Cardiology to ED due to c/o shortness of breath. 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 She is currently on no medication for mental health, may be willing to start medication, and may be willing to be treated by Psychiatry She endorses anxiety, denies significant depression and is not suicidal or homicidal  ROS See HPI  Denies recent fever or chills. .   PE  BP 124/78   Pulse 83   Ht 5\' 4"  (1.626 m)   Wt 267 lb 1.9 oz (121.2 kg)   SpO2 97%   BMI 45.85 kg/m   Patient alert and oriented and in no cardiopulmonary distress.  HEENT: No facial asymmetry, EOMI,     Neck supple .  Chest: Clear to auscultation bilaterally.  CVS: S1, S2 no murmurs, no S3.Regular rate.  .   Ext: No edema  .  Psych: Good eye contact, . Memory intact  anxious not  depressed appearing.  CNS: CN 2-12 intact, power,  normal throughout.no focal deficits noted.   Assessment & Plan  GAD (generalized anxiety disorder) Continue therapy, start low dose buspar, close f/u with PCP, may consider Psych to treat condition  Morbid obesity ([place])  Patient re-educated about  the importance of commitment to a  minimum of 150 minutes of exercise per week as able.  The importance of healthy food choices with portion control discussed, as well as eating regularly and within a 12 hour window most days. The need to choose "clean , green" food 50 to 75% of the time is discussed, as well as to make water the primary drink and set a goal of 64 ounces water daily.       11/01/2022    11:00 AM 10/31/2022    5:30 PM 10/31/2022    3:09 PM  Weight /BMI  Weight 267 lb 1.9 oz 272 lb 272 lb  Height 5\' 4"  (1.626 m) 5\' 4"  (1.626 m) 5\' 4"  (1.626 m)  BMI 45.85 kg/m2 46.69 kg/m2 46.69 kg/m2      Encounter for examination following treatment at hospital Patient in for follow up of recent ED visit. Discharge summary, and laboratory and radiology data are reviewed, and any questions or concerns  are discussed. Specific issues requiring follow up are specifically addressed.

## 2022-11-04 NOTE — Assessment & Plan Note (Signed)
  Patient re-educated about  the importance of commitment to a  minimum of 150 minutes of exercise per week as able.  The importance of healthy food choices with portion control discussed, as well as eating regularly and within a 12 hour window most days. The need to choose "clean , green" food 50 to 75% of the time is discussed, as well as to make water the primary drink and set a goal of 64 ounces water daily.       11/01/2022   11:00 AM 10/31/2022    5:30 PM 10/31/2022    3:09 PM  Weight /BMI  Weight 267 lb 1.9 oz 272 lb 272 lb  Height 5\' 4"  (1.626 m) 5\' 4"  (1.626 m) 5\' 4"  (1.626 m)  BMI 45.85 kg/m2 46.69 kg/m2 46.69 kg/m2

## 2022-11-04 NOTE — Assessment & Plan Note (Signed)
Continue therapy, start low dose buspar, close f/u with PCP, may consider Psych to treat condition

## 2022-11-04 NOTE — Assessment & Plan Note (Signed)
Patient in for follow up of recent ED visit. Discharge summary, and laboratory and radiology data are reviewed, and any questions or concerns  are discussed. Specific issues requiring follow up are specifically addressed.  

## 2022-11-07 ENCOUNTER — Ambulatory Visit: Payer: BC Managed Care – PPO | Admitting: Gastroenterology

## 2022-11-08 DIAGNOSIS — R42 Dizziness and giddiness: Secondary | ICD-10-CM | POA: Diagnosis not present

## 2022-11-08 DIAGNOSIS — H6522 Chronic serous otitis media, left ear: Secondary | ICD-10-CM | POA: Diagnosis not present

## 2022-11-08 DIAGNOSIS — H6982 Other specified disorders of Eustachian tube, left ear: Secondary | ICD-10-CM | POA: Diagnosis not present

## 2022-11-08 DIAGNOSIS — J310 Chronic rhinitis: Secondary | ICD-10-CM | POA: Diagnosis not present

## 2022-11-10 ENCOUNTER — Telehealth: Payer: Self-pay

## 2022-11-10 DIAGNOSIS — R002 Palpitations: Secondary | ICD-10-CM | POA: Diagnosis not present

## 2022-11-10 MED ORDER — PROPRANOLOL HCL ER 60 MG PO CP24: 60.0000 mg | ORAL_CAPSULE | Freq: Every day | ORAL | 3 refills | Status: DC

## 2022-11-10 MED ORDER — PROPRANOLOL HCL 60 MG PO TABS: 60.0000 mg | ORAL_TABLET | Freq: Three times a day (TID) | ORAL | 1 refills | Status: DC

## 2022-11-10 NOTE — Telephone Encounter (Signed)
Monitor results discussed with patient.She would like to try propranolol 60 mg qd, e-scribed 90 day supply to [facility]. I will copy pcp with results.

## 2022-11-10 NOTE — Telephone Encounter (Signed)
-----   Message from Charlie Pitter, [place] sent at 11/10/2022  1:50 PM EST ----- Please let pt know monitor reassuring. No concerning life threatening rhythm problems. There was a low burden of skipped beats called PACs and PVCs, <1% of total beats. Some of these were associated with triggered events so likely what she has been feeling. However, other triggered events were not associated with any arrhythmia - just normal rhythm. If symptomatic/bothersome, can trial Propranolol LA 60mg  daily. (This particular beta blocker chosen because it also has anti-migraine properties and she has a known hx of this.) Await echo and f/u as planned. Remind to get BMET on 2/14 when she comes in for her echo. F/u 12/2022 as planned.

## 2022-11-11 NOTE — Telephone Encounter (Signed)
Saw two rx's sent through for propranolol - initially short acting that was cancelled then long acting which was the intended rx. I called pharmacy to verify they got the 31m long acting rx and know to disregard short acting rx - they acknowledged/confirmed this plan.

## 2022-11-16 ENCOUNTER — Telehealth: Payer: Self-pay

## 2022-11-16 ENCOUNTER — Other Ambulatory Visit (HOSPITAL_COMMUNITY)
Admission: RE | Admit: 2022-11-16 | Discharge: 2022-11-16 | Disposition: A | Discharge status: Home / Self Care | Payer: BC Managed Care – PPO | Source: Ambulatory Visit | Attending: Physician Assistant | Admitting: Physician Assistant

## 2022-11-16 ENCOUNTER — Ambulatory Visit (HOSPITAL_COMMUNITY)
Admission: RE | Admit: 2022-11-16 | Discharge: 2022-11-16 | Disposition: A | Discharge status: Home / Self Care | Payer: BC Managed Care – PPO | Source: Ambulatory Visit | Attending: Physician Assistant | Admitting: Physician Assistant

## 2022-11-16 ENCOUNTER — Ambulatory Visit (INDEPENDENT_AMBULATORY_CARE_PROVIDER_SITE_OTHER): Payer: BC Managed Care – PPO | Admitting: Licensed Clinical Social Worker

## 2022-11-16 DIAGNOSIS — F32A Depression, unspecified: Secondary | ICD-10-CM

## 2022-11-16 DIAGNOSIS — R002 Palpitations: Secondary | ICD-10-CM | POA: Diagnosis not present

## 2022-11-16 DIAGNOSIS — F419 Anxiety disorder, unspecified: Secondary | ICD-10-CM | POA: Diagnosis not present

## 2022-11-16 DIAGNOSIS — Z79899 Other long term (current) drug therapy: Secondary | ICD-10-CM | POA: Insufficient documentation

## 2022-11-16 LAB — BASIC METABOLIC PANEL
Anion gap: 9 (ref 5–15)
BUN: 8 mg/dL (ref 6–20)
CO2: 25 mmol/L (ref 22–32)
Calcium: 8.8 mg/dL — ABNORMAL LOW (ref 8.9–10.3)
Chloride: 103 mmol/L (ref 98–111)
Creatinine, Ser: 0.68 mg/dL (ref 0.44–1.00)
GFR, Estimated: >60 mL/min (ref >60)
Glucose, Bld: 96 mg/dL (ref 70–99)
Potassium: 3.9 mmol/L (ref 3.5–5.1)
Sodium: 137 mmol/L (ref 135–145)

## 2022-11-16 LAB — ECHOCARDIOGRAM COMPLETE
AR max vel: 1.53 cm2
AV Area VTI: 1.4 cm2
AV Area mean vel: 1.59 cm2
AV Mean grad: 6 mmHg
AV Peak grad: 10.6 mmHg
Ao pk vel: 1.63 m/s
Area-P 1/2: 3.17 cm2
S' Lateral: 3.4 cm

## 2022-11-16 MED ORDER — POTASSIUM CHLORIDE CRYS ER 20 MEQ PO TBCR: 40.0000 meq | EXTENDED_RELEASE_TABLET | Freq: Every day | ORAL | 3 refills | Status: DC

## 2022-11-16 NOTE — Telephone Encounter (Signed)
-----   Message from Kristin [name], [place] sent at 11/16/2022 10:54 AM EST ----- Please let pt know echo overall looks good. Mild thickening of left ventricle seen, can sometimes see in the setting of elevated body weight. Left and right heart pump function was normal. Trivial leaking of mitral valve - not worried about this. Overall reassuring result.

## 2022-11-16 NOTE — Telephone Encounter (Signed)
-----   Message from Charlie Pitter, PA-C sent at 11/16/2022 10:02 AM EST ----- Potassium improved but not quite at goal of 4.0 or greater but very close. Goal 4.0 given h/o symptomatic PVCs. Increase KCl from 92meq to 14meq daily with recheck BMET in 1 week.

## 2022-11-16 NOTE — Progress Notes (Signed)
*  PRELIMINARY RESULTS* Echocardiogram 2D Echocardiogram has been performed.  Kristin [name] 11/16/2022, 9:24 AM

## 2022-11-16 NOTE — Telephone Encounter (Signed)
Patient notified of results via MyChart. 

## 2022-11-16 NOTE — Telephone Encounter (Signed)
Patient notified and verbalized understanding. Patient had no questions or concerns at this time.  

## 2022-11-17 NOTE — BH Specialist Note (Signed)
Integrated Behavioral Health via Telemedicine Visit  11/17/2022 Taquasia Sawicki [id number]  Number of Integrated Behavioral Health Clinician visits: 3 Session Start time:  2:46pm Session End time: 3:11pm Total time in minutes: 25 mins via phone   Referring Provider: Verne Grain NP Patient/Family location: Home  [facility] Provider location: [place]  All persons participating in visit: Pt M Chaddock and LCSW A Ezra Denne  Types of Service: Individual psychotherapy and Video visit  I connected with [name] and/or Korey Passow's n/a via  Telephone or Video Enabled Telemedicine Application  (Video is Caregility application) and verified that I am speaking with the correct person using two identifiers. Discussed confidentiality: Yes   I discussed the limitations of telemedicine and the availability of in person appointments.  Discussed there is a possibility of technology failure and discussed alternative modes of communication if that failure occurs.  I discussed that engaging in this telemedicine visit, they consent to the provision of behavioral healthcare and the services will be billed under their insurance.  Patient and/or legal guardian expressed understanding and consented to Telemedicine visit: Yes   Presenting Concerns: Patient and/or family reports the following symptoms/concerns: stress and anxiety Duration of problem: approx 2 months ; Severity of problem: mild  Patient and/or Family's Strengths/Protective Factors: Concrete supports in place (healthy food, safe environments, etc.)  Goals Addressed: Patient will:  Reduce symptoms of: anxiety   Increase knowledge and/or ability of: coping skills   Demonstrate ability to: Increase healthy adjustment to current life circumstances  Progress towards Goals: Ongoing  Interventions: Interventions utilized:  Supportive Counseling Standardized Assessments completed: Not Needed  Patient and/or Family Response: Ms.  Placide reports improvement in mood. Ms. Orenstein reports an interest in looking custody arrangment and camp for children   Assessment: Patient currently experiencing anxiety and stres.   Patient may benefit from integrated bh .  Plan: Follow up with behavioral health clinician on : 3 weeks  Behavioral recommendations: Prioritize rest, self care and collaborate with resources  Referral(s): [place] (In Clinic)  I discussed the assessment and treatment plan with the patient and/or parent/guardian. They were provided an opportunity to ask questions and all were answered. They agreed with the plan and demonstrated an understanding of the instructions.   They were advised to call back or seek an in-person evaluation if the symptoms worsen or if the condition fails to improve as anticipated.  Lynnea Ferrier, LCSW

## 2022-11-18 NOTE — Telephone Encounter (Signed)
Please clarify what the question is - d-dimer was done 1/29 which was abnormal prompting CT angio and LE duplex in ED which were normal. Thanks.

## 2022-11-20 NOTE — Progress Notes (Unsigned)
GI Office Note    Referring Provider: Alvira Monday, [place] Primary Care Physician:  Alvira Monday, [place] Primary Gastroenterologist: Cristopher Estimable.Rourk, MD  Date:  11/21/2022  ID:  Vision Kittler, DOB 05-07-1981, MRN [id number]   Chief Complaint   Chief Complaint  Patient presents with   Follow-up    Still having pain on upper left side and pains in rectal area.    History of Present Illness  Kristin [name] is a 42 y.o. female with a history of constipation, anxiety, GERD, migraines*** presenting today for follow up.   Last office visit 05/31/22. Recently had ran out of trulance. Was doing well on trulance. Denied rectal bleeding. GERD fairly well controlled mostly on pantoprazole 40 mg BID. Denied dysphagia. Known hepatomegaly with normal LFTs and negative serologic workup for viral hepatitis and autoimmune. Ferritin normal. Advised may use pepcid as needed for reflux flares, continue PPI BID. Advised repeat HFP in 5 months, OV in 6 months. Resume trulance daily.   Appears difficulties with obtaining Trulance due to prior authorization required by insurance (January 2024).   Today: Constipation - Insurance wouldn't cover Linzess and then put on Trulance. Trulance is working good. Taking every other day due to fear of dehydration. If up and morning around will take effect her 45 min - 1 hour later.   GERD - Taking pantoprazole 40 mg twice daily and trying not to eat spicy foods and trying not to eat late. Breakthrough about once per week with nausea, feeling need to belch. Feels better after belching. Has not taken Pepcid. Uses chewable tums in place of pepcid.   Been sick since New years eve and had a viral infection and after Z-pack she got COVID and then took paxlovid and then had rebound COVID and still having right flank and left side abdominal pain with that. Always has pain on that left side. 4-5/10. Felt good on Paxlovid. After finishing paxlovid she saw cardiology and her  electrolytes were out of wack and ended up going to ED to check for PE or DVT due to elevated D-dimer.   Rectal pain - Possibly from hemorrhoids. Rectal fullness feeling. Comes and goes. No bleeding. Was worse during time without the Trulance. No itching. No otc medications for this.   Went to [employer] loss and insurance would not pay for [employer].   Current Outpatient Medications  Medication Sig Dispense Refill   levonorgestrel (MIRENA) 20 MCG/24HR IUD 1 each by Intrauterine route once.     pantoprazole (PROTONIX) 40 MG tablet TAKE 1 TABLET BY MOUTH TWICE DAILY BEFORE A MEAL. 60 tablet 5   Plecanatide (TRULANCE) 3 MG TABS Take 3 mg by mouth daily. 90 tablet 3   potassium chloride SA (KLOR-CON M20) 20 MEQ tablet Take 2 tablets (40 mEq total) by mouth daily. 180 tablet 3   rizatriptan (MAXALT) 10 MG tablet May repeat in 2 hours if needed 9 tablet 11   busPIRone (BUSPAR) 5 MG tablet Take 1 tablet (5 mg total) by mouth 2 (two) times daily. (Patient not taking: Reported on 11/21/2022) 60 tablet 0   cetirizine HCl (ZYRTEC) 5 MG/5ML SOLN Take 2.5 mLs (2.5 mg total) by mouth daily. (Patient not taking: Reported on 10/31/2022) 75 mL 0   EPINEPHrine 0.3 mg/0.3 mL IJ SOAJ injection See admin instructions. (Patient not taking: Reported on 11/01/2022)     fluticasone (FLONASE) 50 MCG/ACT nasal spray Place 2 sprays into both nostrils daily. (Patient not taking: Reported on 10/31/2022) 16 g 0  propranolol ER (INDERAL LA) 60 MG 24 hr capsule Take 1 capsule (60 mg total) by mouth daily. (Patient not taking: Reported on 11/21/2022) 90 capsule 3   topiramate (TOPAMAX) 25 MG tablet Take 1 pill in AM and 2 pills in PM for one week, then increase to 2 pills twice a day (Patient not taking: Reported on 11/01/2022) 120 tablet 6   No current facility-administered medications for this visit.    Past Medical History:  Diagnosis Date   Allergy    Anxiety    Atypical squamous cell changes of undetermined significance  (ASCUS) on cervical cytology with negative high risk human papilloma virus (HPV) test result 03/30/2020   6/21 repeat in 3 years ASCCP guidelines 5 year risk CIN 3+ 0.27%   Carpal tunnel syndrome of right wrist 10/22/2018   Contraceptive management 11/05/2013   Depression    Encounter for well woman exam with routine gynecological exam 07/27/2022   GERD (gastroesophageal reflux disease)    IUD (intrauterine device) in place 01/13/2016   Migraines    Missed periods 01/13/2016   Vaginal Pap smear, abnormal     Past Surgical History:  Procedure Laterality Date   COLONOSCOPY N/A 05/10/2019   Normal. next colonoscopy 05/2029   ESOPHAGOGASTRODUODENOSCOPY N/A 05/10/2019   Hiatal hernia   NO PAST SURGERIES      Family History  Problem Relation Age of Onset   Multiple myeloma Paternal Grandfather    Cancer Paternal Grandmother        leukemia   Colon cancer Maternal Grandmother    Hypertension Father    Hyperlipidemia Father    Cancer Father        prostate   Other Mother        enlarged heart   Diabetes Mother    Hypertension Mother    Hyperlipidemia Mother    Heart disease Mother        heart murmer   Miscarriages / Stillbirths Mother    Breast cancer Mother    Other Brother        enlarged heart; colloid cyst of the third ventricle( of the brain)   Hypertension Sister    Colon cancer Maternal Aunt    Gastric cancer Maternal Uncle    Esophageal cancer Maternal Uncle 71   Stomach cancer Maternal Uncle     Allergies as of 11/21/2022 - Review Complete 11/21/2022  Allergen Reaction Noted   Amoxicillin Swelling 08/04/2020   Nurtec [rimegepant sulfate]  10/27/2022   Penicillins Swelling 05/03/2020   Venlafaxine  08/24/2020    Social History   Socioeconomic History   Marital status: Divorced    Spouse name: Not on file   Number of children: 3   Years of education: 14   Highest education level: Not on file  Occupational History   Occupation: Administrator     Comment: [employer]  Tobacco Use   Smoking status: Never   Smokeless tobacco: Never  Vaping Use   Vaping Use: Never used  Substance and Sexual Activity   Alcohol use: Not Currently    Comment: occasional   Drug use: Never   Sexual activity: Yes    Birth control/protection: I.U.D.  Other Topics Concern   Not on file  Social History Narrative   Lives with parents   Looking for own place   Tries to walk daily   Social Determinants of Health   Financial Resource Strain: Medium Risk (07/29/2022)   Overall Financial Resource Strain (CARDIA)  Difficulty of Paying Living Expenses: Somewhat hard  Food Insecurity: No Food Insecurity (07/29/2022)   Hunger Vital Sign    Worried About Running Out of Food in the Last Year: Never true    Ran Out of Food in the Last Year: Never true  Transportation Needs: No Transportation Needs (07/29/2022)   PRAPARE - Hydrologist (Medical): No    Lack of Transportation (Non-Medical): No  Physical Activity: Inactive (07/29/2022)   Exercise Vital Sign    Days of Exercise per Week: 0 days    Minutes of Exercise per Session: 10 min  Stress: Stress Concern Present (07/29/2022)   [place]    Feeling of Stress : Rather much  Social Connections: Moderately Isolated (07/29/2022)   Social Connection and Isolation Panel [NHANES]    Frequency of Communication with Friends and Family: Three times a week    Frequency of Social Gatherings with Friends and Family: More than three times a week    Attends Religious Services: More than 4 times per year    Active Member of [employer] or Organizations: No    Attends Archivist Meetings: Never    Marital Status: Divorced     Review of Systems   Gen: Denies fever, chills, anorexia. Denies fatigue, weakness, weight loss.  CV: Denies chest pain, palpitations, syncope, peripheral edema, and claudication. Resp:  Denies dyspnea at rest, cough, wheezing, coughing up blood, and pleurisy. GI: See HPI Derm: Denies rash, itching, dry skin Psych: Denies depression, anxiety, memory loss, confusion. No homicidal or suicidal ideation.  Heme: Denies bruising, bleeding, and enlarged lymph nodes.   Physical Exam   BP 123/79 (BP Location: Right Arm, Patient Position: Sitting, Cuff Size: Large)   Pulse 88   Temp 97.7 F (36.5 C) (Temporal)   Ht 5' 4"$  (1.626 m)   Wt 272 lb 9.6 oz (123.7 kg)   LMP  (Approximate)   SpO2 100%   BMI 46.79 kg/m   General:   Alert and oriented. No distress noted. Pleasant and cooperative.  Head:  Normocephalic and atraumatic. Eyes:  Conjuctiva clear without scleral icterus. Mouth:  Oral mucosa pink and moist. Good dentition. No lesions. Lungs:  Clear to auscultation bilaterally. No wheezes, rales, or rhonchi. No distress.  Heart:  S1, S2 present without murmurs appreciated.  Abdomen:  +BS, soft, non-tender and non-distended. No rebound or guarding. No HSM or masses noted. Firmness/tightness to left rib cage musculature.  Rectal: deferred Msk:  Symmetrical without gross deformities. Normal posture. Extremities:  Without edema. Neurologic:  Alert and  oriented x4 Psych:  Alert and cooperative. Normal mood and affect.   Assessment  Kristin [name] is a 42 y.o. female with a history of  constipation, anxiety, GERD, migraines*** presenting today for follow up.   Hepatomegaly: Normal LFTs 10/27/22. Hepatomegaly noted on CT A/P in April 2022. Lfts have remained normal. A1c 4.7 in October 2023.   GERD:   Constipation, hemorrhoids, rectal fullness:   PLAN   *** HFP in 1 year Continue Trulance 3 mg once daily Continue pantoprazole 40 mg twice daily Pepcid as needed.  Anusol twice daily for 1 week then as needed.  GERD diet/lifestyle modifications Follow up in 3 months.     Venetia Night, MSN, FNP-BC, AGACNP-BC [facility]

## 2022-11-21 ENCOUNTER — Ambulatory Visit: Payer: BC Managed Care – PPO | Admitting: Gastroenterology

## 2022-11-21 ENCOUNTER — Encounter: Payer: Self-pay | Admitting: Gastroenterology

## 2022-11-21 VITALS — BP 123/79 | HR 88 | Temp 97.7°F | Ht 64.0 in | Wt 272.6 lb

## 2022-11-21 DIAGNOSIS — K649 Unspecified hemorrhoids: Secondary | ICD-10-CM

## 2022-11-21 DIAGNOSIS — K5900 Constipation, unspecified: Secondary | ICD-10-CM | POA: Diagnosis not present

## 2022-11-21 DIAGNOSIS — R160 Hepatomegaly, not elsewhere classified: Secondary | ICD-10-CM

## 2022-11-21 DIAGNOSIS — K219 Gastro-esophageal reflux disease without esophagitis: Secondary | ICD-10-CM

## 2022-11-21 DIAGNOSIS — R198 Other specified symptoms and signs involving the digestive system and abdomen: Secondary | ICD-10-CM

## 2022-11-21 DIAGNOSIS — R109 Unspecified abdominal pain: Secondary | ICD-10-CM

## 2022-11-21 MED ORDER — HYDROCORTISONE (PERIANAL) 2.5 % EX CREA: 1.0000 | TOPICAL_CREAM | Freq: Two times a day (BID) | CUTANEOUS | 1 refills | Status: DC

## 2022-11-21 MED ORDER — PANTOPRAZOLE SODIUM 40 MG PO TBEC: 40.0000 mg | DELAYED_RELEASE_TABLET | Freq: Two times a day (BID) | ORAL | 5 refills | Status: DC

## 2022-11-21 NOTE — Patient Instructions (Signed)
Continue Trulance 3 mg once daily Continue pantoprazole 40 mg twice daily Pepcid 20 mg once nightly as needed. Add this to your regimen if you have more frequent breakthrough in place of tums to have longer symptom relief.   Follow a GERD diet:  Avoid fried, fatty, greasy, spicy, citrus foods. Avoid caffeine and carbonated beverages. Avoid chocolate. Try eating 4-6 small meals a day rather than 3 large meals. Do not eat within 3 hours of laying down. Prop head of bed up on wood or bricks to create a 6 inch incline.  I have sen tin anusol for you to use twice daily for 7 days then as needed. If rectal fullness conitnues we can follow up for rectal exam for further evaluation if needed.   We will plan to follow up in 3 months or sooner if needed.   It was a pleasure to see you today. I want to create trusting relationships with patients. If you receive a survey regarding your visit,  I greatly appreciate you taking time to fill this out on paper or through your MyChart. I value your feedback.  Venetia Night, MSN, FNP-BC, AGACNP-BC [facility]

## 2022-11-23 ENCOUNTER — Encounter: Payer: Self-pay | Admitting: Orthopaedic Surgery

## 2022-11-23 ENCOUNTER — Ambulatory Visit (INDEPENDENT_AMBULATORY_CARE_PROVIDER_SITE_OTHER): Payer: BC Managed Care – PPO | Admitting: Orthopaedic Surgery

## 2022-11-23 ENCOUNTER — Ambulatory Visit (INDEPENDENT_AMBULATORY_CARE_PROVIDER_SITE_OTHER): Payer: BC Managed Care – PPO

## 2022-11-23 VITALS — BP 154/90 | HR 82 | Ht 64.0 in | Wt 270.0 lb

## 2022-11-23 DIAGNOSIS — M5442 Lumbago with sciatica, left side: Secondary | ICD-10-CM

## 2022-11-23 DIAGNOSIS — M25572 Pain in left ankle and joints of left foot: Secondary | ICD-10-CM

## 2022-11-23 DIAGNOSIS — G8929 Other chronic pain: Secondary | ICD-10-CM | POA: Diagnosis not present

## 2022-11-23 MED ORDER — PREDNISONE 5 MG (21) PO TBPK: ORAL_TABLET | ORAL | 0 refills | Status: DC

## 2022-11-23 NOTE — Patient Instructions (Signed)
 Medication Instructions:  YOU HAVE BEEN PRESCRIBED ORAL PREDNISONE  DAY 1: TAKE 6 TABLETS BY MOUTH, TAKE THEM ALL AT ONE TIME DAY 2: TAKE 5 TABLETS BY MOUTH, TAKE THEM ALL AT ONE TIME DAY 3: TAKE 4 TABLETS BY MOUTH, TAKE THEM ALL AT ONE TIME DAY 4: TAKE 3 TABLETS BY MOUTH, TAKE THEM ALL AT ONE TIME DAY 5: TAKE 2 TABLETS BY MOUTH, TAKE THEM ALL AT ONE TIME DAY 6: TAKE 1 TABLET BY MOUTH THIS MEDICATION CAN MAKE YOU HYPER, INCREASE YOUR THIRST AND HUNGER.   Testing/Procedures: NONE  Follow-Up: 1 WEEK  If you need a refill on your  medications prescribed by Dr. Luna Glasgow before your next appointment, please call your pharmacy.  DR.KEELING'S SCHEDULE IS AS FOLLOWS: TUESDAY: ALL DAY WEDNESDAY: MORNING ONLY THURSDAY: MORNING ONLY PLEASE CALL OR SEND A MESSAGE VIA MYCHART BY WEDNESDAY MORNING SO THAT I CAN SEND A REQUEST TO HIM. HE LEAVES THURSDAY BY 11:30AM. HE DOES NOT RETURN TO THE OFFICE UNTIL TUESDAY MORNINGS AND HE DOES NOT CHECK HIS WORK MESSAGES DURING HIS TIME AWAY FROM THE OFFICE. I'M  AND IF YOU NEED SOMETHING, SEND ME A MYCHART MESSAGE OR CALL. MYCHART IS THE FASTEST WAY TO GET IN CONTACT WITH ME.

## 2022-11-23 NOTE — Progress Notes (Signed)
My left leg goes numb and my ankle hurts on the left.  She has had left lower leg pain and numbness that is getting worse over the last several weeks.  She has been seen by primary care and had doppler ultrasound on 11-01-22 which was negative.  Her pain continues.  She cannot take any NSAIDs secondary to marked GI reflux.  She has no trauma, no redness.  She has pain at night as well.  Left ankle has slight lateral swelling but full ROM. Gait is good.  NV intact.  Lower back is not tender and she has near full ROM, normal muscle strength and tone, NV intact.  Knee is not swollen or tender with full ROM.  X-rays were done of the left ankle and lower back, reported separately.  Encounter Diagnoses  Name Primary?   Pain in left ankle and joints of left foot Yes   Chronic bilateral low back pain with left-sided sciatica    I will give prednisone dose pack.  Precautions discussed.  Return in one week.  Call if any problem.  Precautions discussed.  Electronically Signed Sanjuana Kava, MD 2/21/202411:52 AM

## 2022-11-30 ENCOUNTER — Encounter: Payer: Self-pay | Admitting: Orthopaedic Surgery

## 2022-11-30 ENCOUNTER — Ambulatory Visit: Payer: BC Managed Care – PPO | Admitting: Orthopaedic Surgery

## 2022-11-30 DIAGNOSIS — G8929 Other chronic pain: Secondary | ICD-10-CM

## 2022-11-30 DIAGNOSIS — M5442 Lumbago with sciatica, left side: Secondary | ICD-10-CM | POA: Diagnosis not present

## 2022-11-30 DIAGNOSIS — M25572 Pain in left ankle and joints of left foot: Secondary | ICD-10-CM | POA: Diagnosis not present

## 2022-11-30 NOTE — Progress Notes (Signed)
My ankle is much better.  She took the prednisone dose pack and her foot and ankle on the left is much improved.  She has less swelling and is walking better.  Her back is only slightly improved.  Left ankle has no swelling today, full ROM, NV intact, normal gait.  Spine/Pelvis examination:  Inspection:  Overall, sacoiliac joint benign and hips nontender; without crepitus or defects.   Thoracic spine inspection: Alignment normal without kyphosis present   Lumbar spine inspection:  Alignment  with normal lumbar lordosis, without scoliosis apparent.   Thoracic spine palpation:  without tenderness of spinal processes   Lumbar spine palpation: without tenderness of lumbar area; without tightness of lumbar muscles    Range of Motion:   Lumbar flexion, forward flexion is normal without pain or tenderness    Lumbar extension is full without pain or tenderness   Left lateral bend is normal without pain or tenderness   Right lateral bend is normal without pain or tenderness   Straight leg raising is normal  Strength & tone: normal   Stability overall normal stability  Encounter Diagnoses  Name Primary?   Pain in left ankle and joints of left foot Yes   Chronic bilateral low back pain with left-sided sciatica    Try Aspercreme, Biofreeze or Voltaren Gel.  Consider Tumeric.  Return in two weeks.  Call if any problem.  Precautions discussed.  Electronically Signed Sanjuana Kava, MD 2/28/20249:15 AM

## 2022-11-30 NOTE — Patient Instructions (Signed)
Aspercreme, Biofreeze, Blue Emu or Voltaren Gel over the counter 2-3 times daily. Rub into area well each use for best results.   You can also try Turmeric capsules/tablets, this can be found at most stores and pharmacies.

## 2022-12-01 ENCOUNTER — Encounter: Payer: Self-pay | Admitting: Radiology

## 2022-12-02 ENCOUNTER — Ambulatory Visit: Payer: BC Managed Care – PPO | Admitting: Family Medicine

## 2022-12-02 ENCOUNTER — Encounter: Payer: Self-pay | Admitting: Family Medicine

## 2022-12-02 VITALS — BP 133/82 | HR 88 | Ht 64.0 in | Wt 269.1 lb

## 2022-12-02 DIAGNOSIS — R7301 Impaired fasting glucose: Secondary | ICD-10-CM | POA: Diagnosis not present

## 2022-12-02 DIAGNOSIS — G43009 Migraine without aura, not intractable, without status migrainosus: Secondary | ICD-10-CM

## 2022-12-02 DIAGNOSIS — E038 Other specified hypothyroidism: Secondary | ICD-10-CM

## 2022-12-02 DIAGNOSIS — E7849 Other hyperlipidemia: Secondary | ICD-10-CM

## 2022-12-02 DIAGNOSIS — M62838 Other muscle spasm: Secondary | ICD-10-CM

## 2022-12-02 DIAGNOSIS — R5383 Other fatigue: Secondary | ICD-10-CM

## 2022-12-02 DIAGNOSIS — K219 Gastro-esophageal reflux disease without esophagitis: Secondary | ICD-10-CM | POA: Diagnosis not present

## 2022-12-02 DIAGNOSIS — E559 Vitamin D deficiency, unspecified: Secondary | ICD-10-CM

## 2022-12-02 MED ORDER — CYCLOBENZAPRINE HCL 5 MG PO TABS: 5.0000 mg | ORAL_TABLET | Freq: Three times a day (TID) | ORAL | 1 refills | Status: DC | PRN

## 2022-12-02 NOTE — Progress Notes (Unsigned)
Established Patient Office Visit  Subjective:  Patient ID: Kristin [name], female    DOB: 1981/05/03  Age: 42 y.o. MRN: [id number]  CC:  Chief Complaint  Patient presents with   Follow-up    3 month f/u tested positive for covid 10/21/22 has been low on energy ever since.     HPI Kristin [name] is a 42 y.o. female with past medical history of GERD, and Migraines presents for f/u of  chronic medical conditions. For the details of today's visit, please refer to the assessment and plan.     Past Medical History:  Diagnosis Date   Allergy    Anxiety    Atypical squamous cell changes of undetermined significance (ASCUS) on cervical cytology with negative high risk human papilloma virus (HPV) test result 03/30/2020   6/21 repeat in 3 years ASCCP guidelines 5 year risk CIN 3+ 0.27%   Carpal tunnel syndrome of right wrist 10/22/2018   Contraceptive management 11/05/2013   Depression    Encounter for well woman exam with routine gynecological exam 07/27/2022   GERD (gastroesophageal reflux disease)    IUD (intrauterine device) in place 01/13/2016   Migraines    Missed periods 01/13/2016   Vaginal Pap smear, abnormal     Past Surgical History:  Procedure Laterality Date   COLONOSCOPY N/A 05/10/2019   Normal. next colonoscopy 05/2029   ESOPHAGOGASTRODUODENOSCOPY N/A 05/10/2019   Hiatal hernia   NO PAST SURGERIES      Family History  Problem Relation Age of Onset   Multiple myeloma Paternal Grandfather    Cancer Paternal Grandmother        leukemia   Colon cancer Maternal Grandmother    Hypertension Father    Hyperlipidemia Father    Cancer Father        prostate   Other Mother        enlarged heart   Diabetes Mother    Hypertension Mother    Hyperlipidemia Mother    Heart disease Mother        heart murmer   Miscarriages / Stillbirths Mother    Breast cancer Mother    Other Brother        enlarged heart; colloid cyst of the third ventricle( of the brain)    Hypertension Sister    Colon cancer Maternal Aunt    Gastric cancer Maternal Uncle    Esophageal cancer Maternal Uncle 71   Stomach cancer Maternal Uncle     Social History   Socioeconomic History   Marital status: Divorced    Spouse name: Not on file   Number of children: 3   Years of education: 14   Highest education level: Not on file  Occupational History   Occupation: Administrator    Comment: [employer]  Tobacco Use   Smoking status: Never   Smokeless tobacco: Never  Vaping Use   Vaping Use: Never used  Substance and Sexual Activity   Alcohol use: Not Currently    Comment: occasional   Drug use: Never   Sexual activity: Yes    Birth control/protection: I.U.D.  Other Topics Concern   Not on file  Social History Narrative   Lives with parents   Looking for own place   Tries to walk daily   Social Determinants of Health   Financial Resource Strain: Medium Risk (07/29/2022)   Overall Financial Resource Strain (CARDIA)    Difficulty of Paying Living Expenses: Somewhat hard  Food Insecurity: No Food Insecurity (07/29/2022)  Hunger Vital Sign    Worried About Running Out of Food in the Last Year: Never true    Ran Out of Food in the Last Year: Never true  Transportation Needs: No Transportation Needs (07/29/2022)   PRAPARE - Hydrologist (Medical): No    Lack of Transportation (Non-Medical): No  Physical Activity: Inactive (07/29/2022)   Exercise Vital Sign    Days of Exercise per Week: 0 days    Minutes of Exercise per Session: 10 min  Stress: Stress Concern Present (07/29/2022)   [place]    Feeling of Stress : Rather much  Social Connections: Moderately Isolated (07/29/2022)   Social Connection and Isolation Panel [NHANES]    Frequency of Communication with Friends and Family: Three times a week    Frequency of Social Gatherings with Friends and Family:  More than three times a week    Attends Religious Services: More than 4 times per year    Active Member of [employer] or Organizations: No    Attends Archivist Meetings: Never    Marital Status: Divorced  Human resources officer Violence: Not At Risk (07/29/2022)   Humiliation, Afraid, Rape, and Kick questionnaire    Fear of Current or Ex-Partner: No    Emotionally Abused: No    Physically Abused: No    Sexually Abused: No    Outpatient Medications Prior to Visit  Medication Sig Dispense Refill   hydrocortisone (ANUSOL-HC) 2.5 % rectal cream Place 1 Application rectally 2 (two) times daily. 30 g 1   levonorgestrel (MIRENA) 20 MCG/24HR IUD 1 each by Intrauterine route once.     pantoprazole (PROTONIX) 40 MG tablet Take 1 tablet (40 mg total) by mouth 2 (two) times daily before a meal. 60 tablet 5   potassium chloride SA (KLOR-CON M20) 20 MEQ tablet Take 2 tablets (40 mEq total) by mouth daily. 180 tablet 3   propranolol ER (INDERAL LA) 60 MG 24 hr capsule Take 1 capsule (60 mg total) by mouth daily. 90 capsule 3   rizatriptan (MAXALT) 10 MG tablet May repeat in 2 hours if needed 9 tablet 11   topiramate (TOPAMAX) 25 MG tablet Take 1 pill in AM and 2 pills in PM for one week, then increase to 2 pills twice a day 120 tablet 6   Plecanatide (TRULANCE) 3 MG TABS Take 3 mg by mouth daily. (Patient not taking: Reported on 12/02/2022) 90 tablet 3   predniSONE (STERAPRED UNI-PAK 21 TAB) 5 MG (21) TBPK tablet Take 6 pills first day; 5 pills second day; 4 pills third day; 3 pills fourth day; 2 pills next day and 1 pill last day. 21 tablet 0   No facility-administered medications prior to visit.    Allergies  Allergen Reactions   Amoxicillin Swelling   Nurtec [Rimegepant Sulfate]     Made her sick with vomiting and it only eased my headache a little    Penicillins Swelling   Venlafaxine     Other reaction(s): heart palpitations    ROS Review of Systems  Constitutional:  Positive for fatigue.  Negative for chills and fever.  Eyes:  Negative for visual disturbance.  Respiratory:  Negative for chest tightness and shortness of breath.   Gastrointestinal:  Negative for diarrhea and vomiting.  Neurological:  Negative for dizziness and headaches.      Objective:    Physical Exam HENT:     Head: Normocephalic.  Mouth/Throat:     Mouth: Mucous membranes are moist.  Cardiovascular:     Rate and Rhythm: Normal rate.     Heart sounds: Normal heart sounds.  Pulmonary:     Effort: Pulmonary effort is normal.     Breath sounds: Normal breath sounds.  Neurological:     Mental Status: She is alert.     BP 133/82   Pulse 88   Ht '5\' 4"'$  (1.626 m)   Wt 269 lb 1.9 oz (122.1 kg)   SpO2 98%   BMI 46.19 kg/m  Wt Readings from Last 3 Encounters:  12/02/22 269 lb 1.9 oz (122.1 kg)  11/23/22 270 lb (122.5 kg)  11/21/22 272 lb 9.6 oz (123.7 kg)    Lab Results  Component Value Date   TSH 1.514 10/31/2022   Lab Results  Component Value Date   WBC 6.6 10/31/2022   HGB 12.5 10/31/2022   HCT 38.4 10/31/2022   MCV 83.3 10/31/2022   PLT 261 10/31/2022   Lab Results  Component Value Date   NA 137 11/16/2022   K 3.9 11/16/2022   CO2 25 11/16/2022   GLUCOSE 96 11/16/2022   BUN 8 11/16/2022   CREATININE 0.68 11/16/2022   BILITOT 0.7 10/27/2022   ALKPHOS 109 07/24/2022   AST 11 10/27/2022   ALT 10 10/27/2022   PROT 7.2 10/27/2022   ALBUMIN 3.7 07/24/2022   CALCIUM 8.8 (L) 11/16/2022   ANIONGAP 9 11/16/2022   Lab Results  Component Value Date   CHOL 158 06/23/2017   Lab Results  Component Value Date   HDL 49 (L) 06/23/2017   Lab Results  Component Value Date   LDLCALC 94 06/23/2017   Lab Results  Component Value Date   TRIG 66 06/23/2017   Lab Results  Component Value Date   CHOLHDL 3.2 06/23/2017   Lab Results  Component Value Date   HGBA1C 5.2 06/22/2021      Assessment & Plan:  Other fatigue Assessment & Plan: Reports having covid on 10/21/22  and has been feeling fatigue since No upper respiratory symptoms reported No night sweats or unintentional weight loss were reported Encouraged eating nutritious meals and increase her physical activity Will assess her vit D levels today Encouraged to take OTC Vit B12 1000 mcg daily      Gastroesophageal reflux disease without esophagitis Assessment & Plan: Stable on protonix 40 mg twice daily   Migraine without aura and without status migrainosus, not intractable Assessment & Plan: Stable on topamax and rizatriptans   IFG (impaired fasting glucose) -     Hemoglobin A1c  Vitamin D deficiency -     VITAMIN D 25 Hydroxy (Vit-D Deficiency, Fractures)  Other specified hypothyroidism  Other hyperlipidemia -     Lipid panel  Muscle spasm -     Cyclobenzaprine HCl; Take 1 tablet (5 mg total) by mouth 3 (three) times daily as needed for muscle spasms.  Dispense: 30 tablet; Refill: 1    Follow-up: Return in about 3 months (around 03/04/2023).   Alvira Monday, FNP

## 2022-12-02 NOTE — Patient Instructions (Addendum)
I appreciate the opportunity to provide care to you today!    Follow up:  3 months  Labs: please stop by the lab today to get your blood drawn ( TSH, Lipid profile, HgA1c, Vit D)  Fatigue We will assess your vitamin D levels today I recommend taking over-the-counter Vit B12 1077mg daily   Muscle tightness in the neck - Please take flexeril '5mg'$  3 times daily as needed for muscle spasms -I recommend taking Flexeril at bedtime due to the side effects of drowsiness    Please continue to a heart-healthy diet and increase your physical activities. Try to exercise for 386ms at least five times a week.      It was a pleasure to see you and I look forward to continuing to work together on your health and well-being. Please do not hesitate to call the office if you need care or have questions about your care.   Have a wonderful day and week. With Gratitude, GlAlvira MondaySN, FNP-BC

## 2022-12-03 DIAGNOSIS — R5383 Other fatigue: Secondary | ICD-10-CM | POA: Insufficient documentation

## 2022-12-03 NOTE — Assessment & Plan Note (Signed)
Stable on topamax and rizatriptans

## 2022-12-03 NOTE — Assessment & Plan Note (Signed)
Reports having covid on 10/21/22 and has been feeling fatigue since No upper respiratory symptoms reported No night sweats or unintentional weight loss were reported Encouraged eating nutritious meals and increase her physical activity Will assess her vit D levels today Encouraged to take OTC Vit B12 1000 mcg daily

## 2022-12-03 NOTE — Assessment & Plan Note (Signed)
Stable on protonix 40 mg twice daily

## 2022-12-05 DIAGNOSIS — E7849 Other hyperlipidemia: Secondary | ICD-10-CM | POA: Diagnosis not present

## 2022-12-05 DIAGNOSIS — E038 Other specified hypothyroidism: Secondary | ICD-10-CM | POA: Diagnosis not present

## 2022-12-05 DIAGNOSIS — R7301 Impaired fasting glucose: Secondary | ICD-10-CM | POA: Diagnosis not present

## 2022-12-05 DIAGNOSIS — E559 Vitamin D deficiency, unspecified: Secondary | ICD-10-CM | POA: Diagnosis not present

## 2022-12-06 ENCOUNTER — Telehealth (INDEPENDENT_AMBULATORY_CARE_PROVIDER_SITE_OTHER): Payer: BC Managed Care – PPO | Admitting: Family Medicine

## 2022-12-06 ENCOUNTER — Encounter: Payer: Self-pay | Admitting: Family Medicine

## 2022-12-06 DIAGNOSIS — U099 Post covid-19 condition, unspecified: Secondary | ICD-10-CM

## 2022-12-06 DIAGNOSIS — G9332 Myalgic encephalomyelitis/chronic fatigue syndrome: Secondary | ICD-10-CM

## 2022-12-06 LAB — LIPID PANEL
Chol/HDL Ratio: 3.5 ratio (ref 0.0–4.4)
Cholesterol, Total: 148 mg/dL (ref 100–199)
HDL: 42 mg/dL (ref >39)
LDL Chol Calc (NIH): 94 mg/dL (ref 0–99)
Triglycerides: 58 mg/dL (ref 0–149)
VLDL Cholesterol Cal: 12 mg/dL (ref 5–40)

## 2022-12-06 LAB — HEMOGLOBIN A1C
Est. average glucose Bld gHb Est-mCnc: 108 mg/dL
Hgb A1c MFr Bld: 5.4 % (ref 4.8–5.6)

## 2022-12-06 LAB — VITAMIN D 25 HYDROXY (VIT D DEFICIENCY, FRACTURES): Vit D, 25-Hydroxy: 36.6 ng/mL (ref 30.0–100.0)

## 2022-12-06 NOTE — Progress Notes (Signed)
Please advise the patient to schedule an office visit or televisit

## 2022-12-06 NOTE — Progress Notes (Unsigned)
   Virtual Visit via Video Note  I connected with [name] on 12/07/22 at  9:40 AM EST by a video enabled telemedicine application and verified that I am speaking with the correct person using two identifiers.  Patient Location: Home Provider Location: Office/Clinic  I discussed the limitations, risks, security, and privacy concerns of performing an evaluation and management service by video and the availability of in person appointments. I also discussed with the patient that there may be a patient responsible charge related to this service. The patient expressed understanding and agreed to proceed.  Subjective: PCP: Kristin Monday, FNP  Chief Complaint  Patient presents with   Fatigue    Pt reports extreme fatigue, gastro issues, feeling bloated after eating, been on potassium since 02/14 thinks it could be this.    HPI The patient complains of extreme daily fatigue and bloating after eating.  She reports that her symptoms of extreme fatigue started after completing Paxlovid for COVID. She denies upper respiratory symptoms and systematic symptoms.  ROS: Per HPI  Current Outpatient Medications:    cyclobenzaprine (FLEXERIL) 5 MG tablet, Take 1 tablet (5 mg total) by mouth 3 (three) times daily as needed for muscle spasms., Disp: 30 tablet, Rfl: 1   hydrocortisone (ANUSOL-HC) 2.5 % rectal cream, Place 1 Application rectally 2 (two) times daily., Disp: 30 g, Rfl: 1   levonorgestrel (MIRENA) 20 MCG/24HR IUD, 1 each by Intrauterine route once., Disp: , Rfl:    pantoprazole (PROTONIX) 40 MG tablet, Take 1 tablet (40 mg total) by mouth 2 (two) times daily before a meal., Disp: 60 tablet, Rfl: 5   Plecanatide (TRULANCE) 3 MG TABS, Take 3 mg by mouth daily., Disp: 90 tablet, Rfl: 3   potassium chloride SA (KLOR-CON M20) 20 MEQ tablet, Take 2 tablets (40 mEq total) by mouth daily., Disp: 180 tablet, Rfl: 3   propranolol ER (INDERAL LA) 60 MG 24 hr capsule, Take 1 capsule (60 mg total)  by mouth daily., Disp: 90 capsule, Rfl: 3   rizatriptan (MAXALT) 10 MG tablet, May repeat in 2 hours if needed, Disp: 9 tablet, Rfl: 11   topiramate (TOPAMAX) 25 MG tablet, Take 1 pill in AM and 2 pills in PM for one week, then increase to 2 pills twice a day, Disp: 120 tablet, Rfl: 6  Observations/Objective: There were no vitals filed for this visit. Physical Exam No labored breathing.  Speech is clear and coherent with logical content.  Patient is alert and oriented at baseline.   Assessment and Plan: COVID-19 long hauler manifesting chronic fatigue   Encouraged adequate rest, good sleep hygiene, and the four-P  approach  to energy conservation; planning, pacing, prioritizing, positioning Encouraged the patient to continue taking her potassium to prevent PVCs Encouraged use of Tums after eating to prevent bloating and to avoid gas producing foods Patient verbalized understanding   Follow Up Instructions: No follow-ups on file.   I discussed the assessment and treatment plan with the patient. The patient was provided an opportunity to ask questions, and all were answered. The patient agreed with the plan and demonstrated an understanding of the instructions.   The patient was advised to call back or seek an in-person evaluation if the symptoms worsen or if the condition fails to improve as anticipated.  The above assessment and management plan was discussed with the patient. The patient verbalized understanding of and has agreed to the management plan.   Kristin Monday, FNP

## 2022-12-06 NOTE — Progress Notes (Signed)
Kindly inform the patient that her labs are stable

## 2022-12-08 ENCOUNTER — Other Ambulatory Visit: Payer: Self-pay | Admitting: Gastroenterology

## 2022-12-08 MED ORDER — LUBIPROSTONE 8 MCG PO CAPS: 8.0000 ug | ORAL_CAPSULE | Freq: Two times a day (BID) | ORAL | 1 refills | Status: DC

## 2022-12-13 ENCOUNTER — Other Ambulatory Visit: Payer: Self-pay | Admitting: Adult Health

## 2022-12-13 ENCOUNTER — Other Ambulatory Visit: Payer: Self-pay | Admitting: Obstetrics & Gynecology

## 2022-12-14 ENCOUNTER — Ambulatory Visit: Payer: BC Managed Care – PPO | Admitting: Orthopaedic Surgery

## 2022-12-14 ENCOUNTER — Encounter: Payer: Self-pay | Admitting: Orthopaedic Surgery

## 2022-12-14 VITALS — BP 146/93 | HR 70 | Ht 64.0 in | Wt 265.6 lb

## 2022-12-14 DIAGNOSIS — M25572 Pain in left ankle and joints of left foot: Secondary | ICD-10-CM | POA: Diagnosis not present

## 2022-12-14 NOTE — Progress Notes (Signed)
I am doing OK  She has no pain in the left ankle or foot.  She is walking well.  Left ankle foot exam is normal.  Normal gait.  Encounter Diagnosis  Name Primary?   Pain in left ankle and joints of left foot Yes   I will see prn.  Call if any problem.  Precautions discussed.  Electronically Signed Sanjuana Kava, MD 3/13/20248:48 AM

## 2022-12-19 DIAGNOSIS — H9202 Otalgia, left ear: Secondary | ICD-10-CM | POA: Diagnosis not present

## 2022-12-21 ENCOUNTER — Ambulatory Visit: Payer: BC Managed Care – PPO | Attending: Medical | Admitting: Medical

## 2022-12-21 ENCOUNTER — Encounter: Payer: Self-pay | Admitting: Medical

## 2022-12-21 ENCOUNTER — Other Ambulatory Visit (HOSPITAL_COMMUNITY)
Admission: RE | Admit: 2022-12-21 | Discharge: 2022-12-21 | Disposition: A | Discharge status: Home / Self Care | Payer: BC Managed Care – PPO | Source: Ambulatory Visit | Attending: Medical | Admitting: Medical

## 2022-12-21 VITALS — BP 132/88 | HR 96 | Wt 272.8 lb

## 2022-12-21 DIAGNOSIS — R0609 Other forms of dyspnea: Secondary | ICD-10-CM | POA: Diagnosis not present

## 2022-12-21 DIAGNOSIS — R0789 Other chest pain: Secondary | ICD-10-CM | POA: Diagnosis not present

## 2022-12-21 DIAGNOSIS — R002 Palpitations: Secondary | ICD-10-CM | POA: Diagnosis not present

## 2022-12-21 LAB — BASIC METABOLIC PANEL
Anion gap: 5 (ref 5–15)
BUN: 9 mg/dL (ref 6–20)
CO2: 25 mmol/L (ref 22–32)
Calcium: 8.2 mg/dL — ABNORMAL LOW (ref 8.9–10.3)
Chloride: 104 mmol/L (ref 98–111)
Creatinine, Ser: 0.66 mg/dL (ref 0.44–1.00)
GFR, Estimated: >60 mL/min (ref >60)
Glucose, Bld: 97 mg/dL (ref 70–99)
Potassium: 3.7 mmol/L (ref 3.5–5.1)
Sodium: 134 mmol/L — ABNORMAL LOW (ref 135–145)

## 2022-12-21 NOTE — Patient Instructions (Signed)
Medication Instructions:  Your physician recommends that you continue on your current medications as directed. Please refer to the Current Medication list given to you today.   Labwork: BMET (TODAY @ [facility])  Testing/Procedures: none  Follow-Up:  Your physician recommends that you schedule a follow-up appointment in: 3 months  Any Other Special Instructions Will Be Listed Below (If Applicable).  If you need a refill on your cardiac medications before your next appointment, please call your pharmacy.

## 2022-12-21 NOTE — Progress Notes (Signed)
Cardiology Office Note:    Date:  12/21/2022   ID:  Kristin [name], DOB [birth year], MRN [id number]  PCP:  Alvira Monday, [facility] Cardiologist:  Jenkins Rouge, MD  [place] Electrophysiologist:  None   Referring MD: Alvira Monday, FNP   Chief Complaint: 2 month follow-up  History of Present Illness:    Kristin [name] is a 42 y.o. female with a hx of GERD, anxiety, depression, migraines, palpitations with rare PACs/PVCs who presents for 2 month follow-up.   She remotely saw Dr. Johnsie Cancel in the past with atypical chest pain felt related to anxiety and palpitations with monitor in 2021 avg HR 77, <1% PACs/PVCs.  She was seen 10/31/22 for palpitations, SOB and LLE. Palpitations increased after COVID treatment. Labs and heart monitor were ordered. Patient reported DOE and was mildly tachycardia. Ddimer was obtained which was mildly elevated and the patient was sent to the ER. In the ER work-up was negative for PE/DVT.  Today, the patient reports she is doing OK. She still feels tired with some DOE, minor palpitations and atypical chest pain. She had COVID in January 19th and suspects symptoms may be residual from this. She has minor shortness of breath. Patient reports occasional palpitations. Overall symptoms have improved. She has left sided chest pain, it can be at rest and worse when she moves. Pain is not worse with exertion. It is a sharp shooting pain. No lower leg edema, orthopnea or pnd. She has propranolol to take for palpitations, she hasn't needed it.   Past Medical History:  Diagnosis Date   Allergy    Anxiety    Atypical squamous cell changes of undetermined significance (ASCUS) on cervical cytology with negative high risk human papilloma virus (HPV) test result 03/30/2020   6/21 repeat in 3 years ASCCP guidelines 5 year risk CIN 3+ 0.27%   Carpal tunnel syndrome of right wrist 10/22/2018   Contraceptive management 11/05/2013   Depression    Encounter  for well woman exam with routine gynecological exam 07/27/2022   GERD (gastroesophageal reflux disease)    IUD (intrauterine device) in place 01/13/2016   Migraines    Missed periods 01/13/2016   Vaginal Pap smear, abnormal     Past Surgical History:  Procedure Laterality Date   COLONOSCOPY N/A 05/10/2019   Normal. next colonoscopy 05/2029   ESOPHAGOGASTRODUODENOSCOPY N/A 05/10/2019   Hiatal hernia   NO PAST SURGERIES      Current Medications: Current Meds  Medication Sig   cyclobenzaprine (FLEXERIL) 5 MG tablet Take 1 tablet (5 mg total) by mouth 3 (three) times daily as needed for muscle spasms.   doxycycline (VIBRA-TABS) 100 MG tablet TAKE 1 TABLET BY MOUTH TWICE DAILY FOR 10 DAYS.   hydrocortisone (ANUSOL-HC) 2.5 % rectal cream Place 1 Application rectally 2 (two) times daily.   levonorgestrel (MIRENA) 20 MCG/24HR IUD 1 each by Intrauterine route once.   lubiprostone (AMITIZA) 8 MCG capsule Take 1 capsule (8 mcg total) by mouth 2 (two) times daily with a meal.   pantoprazole (PROTONIX) 40 MG tablet Take 1 tablet (40 mg total) by mouth 2 (two) times daily before a meal.   potassium chloride SA (KLOR-CON M20) 20 MEQ tablet Take 2 tablets (40 mEq total) by mouth daily.   propranolol ER (INDERAL LA) 60 MG 24 hr capsule Take 1 capsule (60 mg total) by mouth daily.   rizatriptan (MAXALT) 10 MG tablet May repeat in 2 hours if needed   silver sulfADIAZINE (SILVADENE) 1 %  cream APPLY AS DIRECTED TO AFFECTED AREA 3-4 TIMES DAILY   topiramate (TOPAMAX) 25 MG tablet Take 1 pill in AM and 2 pills in PM for one week, then increase to 2 pills twice a day     Allergies:   Amoxicillin, Nurtec [rimegepant sulfate], Penicillins, and Venlafaxine   Social History   Socioeconomic History   Marital status: Divorced    Spouse name: Not on file   Number of children: 3   Years of education: 14   Highest education level: Not on file  Occupational History   Occupation: Administrator    Comment:  [employer]  Tobacco Use   Smoking status: Never    Passive exposure: Never   Smokeless tobacco: Never  Vaping Use   Vaping Use: Never used  Substance and Sexual Activity   Alcohol use: Not Currently    Comment: occasional   Drug use: Never   Sexual activity: Yes    Birth control/protection: I.U.D.  Other Topics Concern   Not on file  Social History Narrative   Lives with parents   Looking for own place   Tries to walk daily   Social Determinants of Health   Financial Resource Strain: Medium Risk (07/29/2022)   Overall Financial Resource Strain (CARDIA)    Difficulty of Paying Living Expenses: Somewhat hard  Food Insecurity: No Food Insecurity (07/29/2022)   Hunger Vital Sign    Worried About Running Out of Food in the Last Year: Never true    Ran Out of Food in the Last Year: Never true  Transportation Needs: No Transportation Needs (07/29/2022)   PRAPARE - Hydrologist (Medical): No    Lack of Transportation (Non-Medical): No  Physical Activity: Inactive (07/29/2022)   Exercise Vital Sign    Days of Exercise per Week: 0 days    Minutes of Exercise per Session: 10 min  Stress: Stress Concern Present (07/29/2022)   [place]    Feeling of Stress : Rather much  Social Connections: Moderately Isolated (07/29/2022)   Social Connection and Isolation Panel [NHANES]    Frequency of Communication with Friends and Family: Three times a week    Frequency of Social Gatherings with Friends and Family: More than three times a week    Attends Religious Services: More than 4 times per year    Active Member of [employer] or Organizations: No    Attends Music therapist: Never    Marital Status: Divorced     Family History: The patient's family history includes Breast cancer in her mother; Cancer in her father and paternal grandmother; Colon cancer in her maternal aunt and  maternal grandmother; Diabetes in her mother; Esophageal cancer (age of onset: 60) in her maternal uncle; Gastric cancer in her maternal uncle; Heart disease in her mother; Hyperlipidemia in her father and mother; Hypertension in her father, mother, and sister; Miscarriages / Stillbirths in her mother; Multiple myeloma in her paternal grandfather; Other in her brother and mother; Stomach cancer in her maternal uncle.  ROS:   Please see the history of present illness.     All other systems reviewed and are negative.  EKGs/Labs/Other Studies Reviewed:    The following studies were reviewed today:  Echo 11/2022  1. Left ventricular ejection fraction, by estimation, is 55 to 60%. The  left ventricle has normal function. The left ventricle has no regional  wall motion abnormalities. There is mild  concentric left ventricular  hypertrophy. Left ventricular diastolic  parameters are indeterminate.   2. Right ventricular systolic function is normal. The right ventricular  size is normal. There is normal pulmonary artery systolic pressure. The  estimated right ventricular systolic pressure is 91.9 mmHg.   3. The mitral valve is grossly normal. Trivial mitral valve  regurgitation.   4. The aortic valve is tricuspid. Aortic valve regurgitation is not  visualized. No aortic stenosis is present. Aortic valve mean gradient  measures 6.0 mmHg.   5. The inferior vena cava is normal in size with greater than 50%  respiratory variability, suggesting right atrial pressure of 3 mmHg.   Comparison(s): No prior Echocardiogram.   Heart monitor 11/2022 Patch Wear Time:  6 days and 14 hours (2024-01-29T16:35:46-0500 to 2024-02-05T07:11:24-0500)   Patient had a min HR of 49 bpm, max HR of 154 bpm, and avg HR of 74 bpm. Predominant underlying rhythm was Sinus Rhythm. Isolated SVEs were rare (<1.0%), SVE Couplets were rare (<1.0%), and SVE Triplets were rare (<1.0%). Isolated VEs were rare (<1.0%),  and no VE  Couplets or VE Triplets were present. Ventricular Bigeminy was present.    Jenkins Rouge MD [facility]    EKG:  EKG is not ordered today.    Recent Labs: 10/27/2022: ALT 10 10/31/2022: Hemoglobin 12.5; Magnesium 2.0; Platelets 261; TSH 1.514 11/16/2022: BUN 8; Creatinine, Ser 0.68; Potassium 3.9; Sodium 137  Recent Lipid Panel    Component Value Date/Time   CHOL 148 12/05/2022 0826   TRIG 58 12/05/2022 0826   HDL 42 12/05/2022 0826   CHOLHDL 3.5 12/05/2022 0826   CHOLHDL 3.2 06/23/2017 1049   LDLCALC 94 12/05/2022 0826   LDLCALC 94 06/23/2017 1049    Physical Exam:    VS:  BP 132/88 (BP Location: Right Arm, Patient Position: Sitting, Cuff Size: Large)   Pulse 96   Wt 272 lb 12.8 oz (123.7 kg)   LMP  (Approximate)   SpO2 97%   BMI 46.83 kg/m     Wt Readings from Last 3 Encounters:  12/21/22 272 lb 12.8 oz (123.7 kg)  12/14/22 265 lb 9.6 oz (120.5 kg)  12/02/22 269 lb 1.9 oz (122.1 kg)     GEN:  Well nourished, well developed in no acute distress HEENT: Normal NECK: No JVD; No carotid bruits LYMPHATICS: No lymphadenopathy CARDIAC: RRR, no murmurs, rubs, gallops RESPIRATORY:  Clear to auscultation without rales, wheezing or rhonchi  ABDOMEN: Soft, non-tender, non-distended MUSCULOSKELETAL:  No edema; No deformity  SKIN: Warm and dry NEUROLOGIC:  Alert and oriented x 3 PSYCHIATRIC:  Normal affect   ASSESSMENT:    1. Palpitations   2. DOE (dyspnea on exertion)   3. Atypical chest pain    PLAN:    In order of problems listed above:  Palpitations Patient reports palpitations, DOE and atypical chest pain since COVID in January 2024, which all may be residual from this. Heart monitor showed NSR with rare PACs and PVCs. Patient was given propranolol to take as needed, but has not taken it. She reports symptoms are slowly improving. Patient does not feel symptoms are not bad enough to take a daily medication. Patient was placed on potassium for K>4 (3.7). I will re-check a  BMET today.   DOE Patient reports improving shortness of breath. Echo showed normal LVEF with normal diastolic parameters. She is euvolemic on exam. No further work-up at this time.   Atypical chest pain Patient reports persistent atypical chest pain. We discussed noninvasive  testing, but decided to watch and wait at this time. We will reassess symptoms at follow-up.   Disposition: Follow up in 3 month(s) with MD/APP    Signed, Anberlyn Feimster Ninfa Meeker, PA-C  12/21/2022 2:53 PM    [facility]

## 2022-12-22 ENCOUNTER — Telehealth: Payer: Self-pay | Admitting: Cardiovascular Disease

## 2022-12-22 ENCOUNTER — Encounter: Payer: Self-pay | Admitting: Family Medicine

## 2022-12-22 ENCOUNTER — Encounter: Payer: Self-pay | Admitting: Cardiovascular Disease

## 2022-12-22 NOTE — Telephone Encounter (Signed)
Cadence Ninfa Meeker, PA-C 12/21/2022  3:31 PM EDT     Labs show K 3.7, which is normal. OK to stop potassium. I would recommend eating more potassium In diet.    Patient informed and verbalized understanding of plan.

## 2022-12-22 NOTE — Addendum Note (Signed)
Addended by: Merlene Laughter on: 12/22/2022 03:22 PM   Modules accepted: Orders

## 2022-12-22 NOTE — Telephone Encounter (Signed)
Patient calling in bout her labs results. Please advise  

## 2022-12-27 ENCOUNTER — Telehealth: Payer: Self-pay | Admitting: Family Medicine

## 2022-12-27 ENCOUNTER — Other Ambulatory Visit: Payer: Self-pay | Admitting: Family Medicine

## 2022-12-27 NOTE — Telephone Encounter (Signed)
Pt called in for lab results  °

## 2022-12-27 NOTE — Telephone Encounter (Signed)
I spoke with the patient. 

## 2022-12-27 NOTE — Telephone Encounter (Signed)
Pt states heartcare ordered labs to check for potassium levels, however calcium seemed to be elevated in that lab as well pt is concerned she called heartcare to try to address this but she was told she needs to follow up with PCP on this. Sent a Mychart message last week about this, she would like a return call about this. Please advice?

## 2023-01-03 ENCOUNTER — Other Ambulatory Visit (INDEPENDENT_AMBULATORY_CARE_PROVIDER_SITE_OTHER): Payer: BC Managed Care – PPO

## 2023-01-03 ENCOUNTER — Encounter: Payer: Self-pay | Admitting: Orthopaedic Surgery

## 2023-01-03 ENCOUNTER — Ambulatory Visit: Payer: BC Managed Care – PPO | Admitting: Orthopaedic Surgery

## 2023-01-03 VITALS — BP 122/80 | HR 78 | Ht 64.0 in | Wt 265.0 lb

## 2023-01-03 DIAGNOSIS — M542 Cervicalgia: Secondary | ICD-10-CM

## 2023-01-03 MED ORDER — DIAZEPAM 5 MG PO TABS: ORAL_TABLET | ORAL | 0 refills | Status: DC

## 2023-01-03 MED ORDER — PREDNISONE 5 MG (21) PO TBPK: ORAL_TABLET | ORAL | 0 refills | Status: DC

## 2023-01-03 NOTE — Progress Notes (Signed)
My neck hurts.  She has developed pain of the neck on the left side and left upper trapezius.  She has no trauma.  She uses a computer all day at work and uses a mouse.  She has had this for three weeks and it is not better.  She cannot take NSAIDs. She has used heat, ice, Tylenol with only slight relief. She has used Vicks rub.  She has continued pain but localized and no paresthesias to the hand.  Left upper neck is tender and very much so over the left upper trapezius with tightness but no spasm.  NV intact. ROM of neck full but tender more to the right.  Grips normal.  Encounter Diagnosis  Name Primary?   Neck pain Yes   X-rays were done, reported separately.  She has loss of cervical lordosis.  I will begin prednisone dose pack, valium.  Precautions given.  Return in one week.  Take picture of her at her desk working and evaluate posture, position of arms, etc.  Call if any problem.  Precautions discussed.  Electronically Signed Sanjuana Kava, MD 4/2/20249:09 AM

## 2023-01-10 ENCOUNTER — Ambulatory Visit (INDEPENDENT_AMBULATORY_CARE_PROVIDER_SITE_OTHER): Payer: BC Managed Care – PPO | Admitting: Orthopaedic Surgery

## 2023-01-10 ENCOUNTER — Encounter: Payer: Self-pay | Admitting: Orthopaedic Surgery

## 2023-01-10 VITALS — BP 128/70 | HR 92 | Ht 64.0 in | Wt 260.0 lb

## 2023-01-10 DIAGNOSIS — M542 Cervicalgia: Secondary | ICD-10-CM

## 2023-01-10 DIAGNOSIS — M62838 Other muscle spasm: Secondary | ICD-10-CM

## 2023-01-10 DIAGNOSIS — M546 Pain in thoracic spine: Secondary | ICD-10-CM

## 2023-01-10 MED ORDER — CYCLOBENZAPRINE HCL 5 MG PO TABS: 5.0000 mg | ORAL_TABLET | Freq: Three times a day (TID) | ORAL | 1 refills | Status: DC | PRN

## 2023-01-10 NOTE — Patient Instructions (Addendum)
Physical therapy has been ordered for you at [facility] They should call you to schedule, [phone]  [phone].is the phone number to call, if you want to call to schedule.

## 2023-01-10 NOTE — Progress Notes (Signed)
My neck is better but my upper back is not.  She could not tolerate the diazepam.  She did take the prednisone and it helped her neck very much.  She has pain in the upper trapezius area.  She has no new trauma, no paresthesias.  Neck has full ROM today.  She has tenderness of the left upper trapezius and the upper left back.  She has no spasm.  ROM of shoulder on left is full. NV intact.  Encounter Diagnoses  Name Primary?   Neck pain Yes   Pain in thoracic spine    I will begin PT.  Stop the diazepam.  I will begin Flexeril.  Return in two weeks.  Call if any problem.  Precautions discussed.  Electronically Signed Darreld Mclean, MD 4/9/20248:53 AM

## 2023-01-11 ENCOUNTER — Encounter: Payer: Self-pay | Admitting: Family Medicine

## 2023-01-12 ENCOUNTER — Encounter: Payer: Self-pay | Admitting: Family Medicine

## 2023-01-12 ENCOUNTER — Ambulatory Visit: Payer: BC Managed Care – PPO | Admitting: Family Medicine

## 2023-01-12 ENCOUNTER — Other Ambulatory Visit (HOSPITAL_COMMUNITY): Payer: Self-pay | Admitting: Family Medicine

## 2023-01-12 VITALS — BP 124/83 | HR 92 | Ht 64.0 in | Wt 266.1 lb

## 2023-01-12 DIAGNOSIS — M542 Cervicalgia: Secondary | ICD-10-CM | POA: Diagnosis not present

## 2023-01-12 DIAGNOSIS — N6324 Unspecified lump in the left breast, lower inner quadrant: Secondary | ICD-10-CM

## 2023-01-12 DIAGNOSIS — N6320 Unspecified lump in the left breast, unspecified quadrant: Secondary | ICD-10-CM

## 2023-01-12 DIAGNOSIS — Z1231 Encounter for screening mammogram for malignant neoplasm of breast: Secondary | ICD-10-CM | POA: Diagnosis not present

## 2023-01-12 NOTE — Assessment & Plan Note (Signed)
Complains of pain in the neck and back She has completed prescribed steroid therapy She is following up with orthopedics, and a referral was placed to physical therapy Encouraged the patient to follow physical therapy, take a prescribed muscle relaxer, and follow-up with Ortho care as scheduled

## 2023-01-12 NOTE — Patient Instructions (Addendum)
I appreciate the opportunity to provide care to you today!    Follow up:  03/10/2023  Schedule Mammogram and Diagnostic Mammogram   Please continue to a heart-healthy diet and increase your physical activities. Try to exercise for at least five days a week.      It was a pleasure to see you and I look forward to continuing to work together on your health and well-being. Please do not hesitate to call the office if you need care or have questions about your care.   Have a wonderful day and week. With Gratitude, Gilmore Laroche MSN, FNP-BC

## 2023-01-12 NOTE — Assessment & Plan Note (Signed)
Maternal family history of breast cancer She reports palpating a breast lump on her left breast with tenderness at the affected site Breast lump was palpated at the 7:00 region of the left breast We will get a diagnostic ultrasound Last mammogram was 08/22/2022, with no malignancy found Will order screening mammogram

## 2023-01-12 NOTE — Progress Notes (Signed)
Established Patient Office Visit  Subjective:  Patient ID: Kristin [name], female    DOB: 01/09/1981  Age: 42 y.o. MRN: [id number]  CC:  Chief Complaint  Patient presents with   Breast Problem    Pt reports breast concerns. Has noticed a lump under left breast started a couple of weeks ago (12/22/22).    Back Pain    Pt reports neck and back pain pulling on left side. Also having jittery feeling    HPI Kristin [name] is a 42 y.o. female who presents with complaints of a breast lump on her left breast that started a couple weeks ago.  She reports breast pain at the site of the lump. For the details of today's visit, please refer to the assessment and plan.     Past Medical History:  Diagnosis Date   Allergy    Anxiety    Atypical squamous cell changes of undetermined significance (ASCUS) on cervical cytology with negative high risk human papilloma virus (HPV) test result 03/30/2020   6/21 repeat in 3 years ASCCP guidelines 5 year risk CIN 3+ 0.27%   Carpal tunnel syndrome of right wrist 10/22/2018   Contraceptive management 11/05/2013   Depression    Encounter for well woman exam with routine gynecological exam 07/27/2022   GERD (gastroesophageal reflux disease)    IUD (intrauterine device) in place 01/13/2016   Migraines    Missed periods 01/13/2016   Vaginal Pap smear, abnormal     Past Surgical History:  Procedure Laterality Date   COLONOSCOPY N/A 05/10/2019   Normal. next colonoscopy 05/2029   ESOPHAGOGASTRODUODENOSCOPY N/A 05/10/2019   Hiatal hernia   NO PAST SURGERIES      Family History  Problem Relation Age of Onset   Multiple myeloma Paternal Grandfather    Cancer Paternal Grandmother        leukemia   Colon cancer Maternal Grandmother    Hypertension Father    Hyperlipidemia Father    Cancer Father        prostate   Other Mother        enlarged heart   Diabetes Mother    Hypertension Mother    Hyperlipidemia Mother    Heart disease Mother         heart murmer   Miscarriages / Stillbirths Mother    Breast cancer Mother    Other Brother        enlarged heart; colloid cyst of the third ventricle( of the brain)   Hypertension Sister    Colon cancer Maternal Aunt    Gastric cancer Maternal Uncle    Esophageal cancer Maternal Uncle 75   Stomach cancer Maternal Uncle     Social History   Socioeconomic History   Marital status: Divorced    Spouse name: Not on file   Number of children: 3   Years of education: 14   Highest education level: Not on file  Occupational History   Occupation: Public house manager    Comment: [employer]  Tobacco Use   Smoking status: Never    Passive exposure: Never   Smokeless tobacco: Never  Vaping Use   Vaping Use: Never used  Substance and Sexual Activity   Alcohol use: Not Currently    Comment: occasional   Drug use: Never   Sexual activity: Yes    Birth control/protection: I.U.D.  Other Topics Concern   Not on file  Social History Narrative   Lives with parents   Looking for own place  Tries to walk daily   Social Determinants of Health   Financial Resource Strain: Medium Risk (07/29/2022)   Overall Financial Resource Strain (CARDIA)    Difficulty of Paying Living Expenses: Somewhat hard  Food Insecurity: No Food Insecurity (07/29/2022)   Hunger Vital Sign    Worried About Running Out of Food in the Last Year: Never true    Ran Out of Food in the Last Year: Never true  Transportation Needs: No Transportation Needs (07/29/2022)   PRAPARE - Administrator, Civil Service (Medical): No    Lack of Transportation (Non-Medical): No  Physical Activity: Inactive (07/29/2022)   Exercise Vital Sign    Days of Exercise per Week: 0 days    Minutes of Exercise per Session: 10 min  Stress: Stress Concern Present (07/29/2022)   [employer] of Occupational Health - Occupational Stress Questionnaire    Feeling of Stress : Rather much  Social Connections: Moderately Isolated  (07/29/2022)   Social Connection and Isolation Panel [NHANES]    Frequency of Communication with Friends and Family: Three times a week    Frequency of Social Gatherings with Friends and Family: More than three times a week    Attends Religious Services: More than 4 times per year    Active Member of [employer] or Organizations: No    Attends Banker Meetings: Never    Marital Status: Divorced  Catering manager Violence: Not At Risk (07/29/2022)   Humiliation, Afraid, Rape, and Kick questionnaire    Fear of Current or Ex-Partner: No    Emotionally Abused: No    Physically Abused: No    Sexually Abused: No    Outpatient Medications Prior to Visit  Medication Sig Dispense Refill   cyclobenzaprine (FLEXERIL) 5 MG tablet Take 1 tablet (5 mg total) by mouth 3 (three) times daily as needed for muscle spasms. 30 tablet 1   hydrocortisone (ANUSOL-HC) 2.5 % rectal cream Place 1 Application rectally 2 (two) times daily. 30 g 1   levonorgestrel (MIRENA) 20 MCG/24HR IUD 1 each by Intrauterine route once.     lubiprostone (AMITIZA) 8 MCG capsule Take 1 capsule (8 mcg total) by mouth 2 (two) times daily with a meal. 60 capsule 1   pantoprazole (PROTONIX) 40 MG tablet Take 1 tablet (40 mg total) by mouth 2 (two) times daily before a meal. 60 tablet 5   predniSONE (STERAPRED UNI-PAK 21 TAB) 5 MG (21) TBPK tablet Take 6 pills first day; 5 pills second day; 4 pills third day; 3 pills fourth day; 2 pills next day and 1 pill last day. 21 tablet 0   propranolol ER (INDERAL LA) 60 MG 24 hr capsule Take 1 capsule (60 mg total) by mouth daily. 90 capsule 3   rizatriptan (MAXALT) 10 MG tablet May repeat in 2 hours if needed 9 tablet 11   silver sulfADIAZINE (SILVADENE) 1 % cream APPLY AS DIRECTED TO AFFECTED AREA 3-4 TIMES DAILY 50 g 11   topiramate (TOPAMAX) 25 MG tablet Take 1 pill in AM and 2 pills in PM for one week, then increase to 2 pills twice a day 120 tablet 6   No facility-administered  medications prior to visit.    Allergies  Allergen Reactions   Amoxicillin Swelling   Nurtec [Rimegepant Sulfate]     Made her sick with vomiting and it only eased my headache a little    Penicillins Swelling   Venlafaxine     Other reaction(s): heart palpitations  ROS Review of Systems  Constitutional:  Negative for chills and fever.  Eyes:  Negative for visual disturbance.  Respiratory:  Negative for chest tightness and shortness of breath.   Neurological:  Negative for dizziness and headaches.      Objective:    Physical Exam HENT:     Head: Normocephalic.     Mouth/Throat:     Mouth: Mucous membranes are moist.  Cardiovascular:     Rate and Rhythm: Normal rate.     Heart sounds: Normal heart sounds.  Pulmonary:     Effort: Pulmonary effort is normal.     Breath sounds: Normal breath sounds.  Chest:  Breasts:    Left: Mass and tenderness present. No swelling, bleeding, inverted nipple, nipple discharge or skin change.  Neurological:     Mental Status: She is alert.     BP 124/83   Pulse 92   Ht 5\' 4"  (1.626 m)   Wt 266 lb 1.9 oz (120.7 kg)   SpO2 93%   BMI 45.68 kg/m  Wt Readings from Last 3 Encounters:  01/12/23 266 lb 1.9 oz (120.7 kg)  01/10/23 260 lb (117.9 kg)  01/03/23 265 lb (120.2 kg)    Lab Results  Component Value Date   TSH 1.514 10/31/2022   Lab Results  Component Value Date   WBC 6.6 10/31/2022   HGB 12.5 10/31/2022   HCT 38.4 10/31/2022   MCV 83.3 10/31/2022   PLT 261 10/31/2022   Lab Results  Component Value Date   NA 134 (L) 12/21/2022   K 3.7 12/21/2022   CO2 25 12/21/2022   GLUCOSE 97 12/21/2022   BUN 9 12/21/2022   CREATININE 0.66 12/21/2022   BILITOT 0.7 10/27/2022   ALKPHOS 109 07/24/2022   AST 11 10/27/2022   ALT 10 10/27/2022   PROT 7.2 10/27/2022   ALBUMIN 3.7 07/24/2022   CALCIUM 8.2 (L) 12/21/2022   ANIONGAP 5 12/21/2022   Lab Results  Component Value Date   CHOL 148 12/05/2022   Lab Results   Component Value Date   HDL 42 12/05/2022   Lab Results  Component Value Date   LDLCALC 94 12/05/2022   Lab Results  Component Value Date   TRIG 58 12/05/2022   Lab Results  Component Value Date   CHOLHDL 3.5 12/05/2022   Lab Results  Component Value Date   HGBA1C 5.4 12/05/2022      Assessment & Plan:  Breast lump on left side at 7 o'clock position Assessment & Plan: Maternal family history of breast cancer She reports palpating a breast lump on her left breast with tenderness at the affected site Breast lump was palpated at the 7:00 region of the left breast We will get a diagnostic ultrasound Last mammogram was 08/22/2022, with no malignancy found Will order screening mammogram  Orders: -     MM 3D DIAGNOSTIC MAMMOGRAM UNILATERAL LEFT BREAST  Breast cancer screening by mammogram -     3D Screening Mammogram, Left and Right  Neck pain Assessment & Plan: Complains of pain in the neck and back She has completed prescribed steroid therapy She is following up with orthopedics, and a referral was placed to physical therapy Encouraged the patient to follow physical therapy, take a prescribed muscle relaxer, and follow-up with Ortho care as scheduled      Follow-up: Return in about 8 weeks (around 03/10/2023).   Gilmore LarocheGloria  Reizy Dunlow, FNP

## 2023-01-16 ENCOUNTER — Encounter: Payer: Self-pay | Admitting: Family Medicine

## 2023-01-18 ENCOUNTER — Ambulatory Visit: Payer: BC Managed Care – PPO | Admitting: Gastroenterology

## 2023-01-18 NOTE — Progress Notes (Deleted)
GI Office Note    Referring Provider: Gilmore Laroche, FNP Primary Care Physician:  Gilmore Laroche, FNP Primary Gastroenterologist: Gerrit Friends.Rourk, MD  Date:  01/18/2023  ID:  Kristin [name], DOB 1981-08-01, MRN [id number]   Chief Complaint   No chief complaint on file.  History of Present Illness  Kristin [name] is a 42 y.o. female with a history of *** presenting today with complaint of   EGD August 2020: -***  Colonoscopy August 2020: -***  Last office visit 11/21/2022. ***   In the interim of her last office visit I sent in Amitiza to help with her constipation and bloating.  Also advised probiotic to help with bloating and Gas-X as needed as well.  Advised turmeric could be contributing to her nausea.   Today:  GERD, nausea -   Constipation, bloating -   Current Outpatient Medications  Medication Sig Dispense Refill   cyclobenzaprine (FLEXERIL) 5 MG tablet Take 1 tablet (5 mg total) by mouth 3 (three) times daily as needed for muscle spasms. 30 tablet 1   hydrocortisone (ANUSOL-HC) 2.5 % rectal cream Place 1 Application rectally 2 (two) times daily. 30 g 1   levonorgestrel (MIRENA) 20 MCG/24HR IUD 1 each by Intrauterine route once.     lubiprostone (AMITIZA) 8 MCG capsule Take 1 capsule (8 mcg total) by mouth 2 (two) times daily with a meal. 60 capsule 1   pantoprazole (PROTONIX) 40 MG tablet Take 1 tablet (40 mg total) by mouth 2 (two) times daily before a meal. 60 tablet 5   predniSONE (STERAPRED UNI-PAK 21 TAB) 5 MG (21) TBPK tablet Take 6 pills first day; 5 pills second day; 4 pills third day; 3 pills fourth day; 2 pills next day and 1 pill last day. 21 tablet 0   propranolol ER (INDERAL LA) 60 MG 24 hr capsule Take 1 capsule (60 mg total) by mouth daily. 90 capsule 3   rizatriptan (MAXALT) 10 MG tablet May repeat in 2 hours if needed 9 tablet 11   silver sulfADIAZINE (SILVADENE) 1 % cream APPLY AS DIRECTED TO AFFECTED AREA 3-4 TIMES DAILY 50 g 11    topiramate (TOPAMAX) 25 MG tablet Take 1 pill in AM and 2 pills in PM for one week, then increase to 2 pills twice a day 120 tablet 6   No current facility-administered medications for this visit.    Past Medical History:  Diagnosis Date   Allergy    Anxiety    Atypical squamous cell changes of undetermined significance (ASCUS) on cervical cytology with negative high risk human papilloma virus (HPV) test result 03/30/2020   6/21 repeat in 3 years ASCCP guidelines 5 year risk CIN 3+ 0.27%   Carpal tunnel syndrome of right wrist 10/22/2018   Contraceptive management 11/05/2013   Depression    Encounter for well woman exam with routine gynecological exam 07/27/2022   GERD (gastroesophageal reflux disease)    IUD (intrauterine device) in place 01/13/2016   Migraines    Missed periods 01/13/2016   Vaginal Pap smear, abnormal     Past Surgical History:  Procedure Laterality Date   COLONOSCOPY N/A 05/10/2019   Normal. next colonoscopy 05/2029   ESOPHAGOGASTRODUODENOSCOPY N/A 05/10/2019   Hiatal hernia   NO PAST SURGERIES      Family History  Problem Relation Age of Onset   Multiple myeloma Paternal Grandfather    Cancer Paternal Grandmother        leukemia   Colon cancer Maternal Grandmother  Hypertension Father    Hyperlipidemia Father    Cancer Father        prostate   Other Mother        enlarged heart   Diabetes Mother    Hypertension Mother    Hyperlipidemia Mother    Heart disease Mother        heart murmer   Miscarriages / Stillbirths Mother    Breast cancer Mother    Other Brother        enlarged heart; colloid cyst of the third ventricle( of the brain)   Hypertension Sister    Colon cancer Maternal Aunt    Gastric cancer Maternal Uncle    Esophageal cancer Maternal Uncle 50   Stomach cancer Maternal Uncle     Allergies as of 01/19/2023 - Review Complete 01/12/2023  Allergen Reaction Noted   Amoxicillin Swelling 08/04/2020   Nurtec [rimegepant sulfate]   10/27/2022   Penicillins Swelling 05/03/2020   Venlafaxine  08/24/2020    Social History   Socioeconomic History   Marital status: Divorced    Spouse name: Not on file   Number of children: 3   Years of education: 14   Highest education level: Not on file  Occupational History   Occupation: Public house manager    Comment: [employer]  Tobacco Use   Smoking status: Never    Passive exposure: Never   Smokeless tobacco: Never  Vaping Use   Vaping Use: Never used  Substance and Sexual Activity   Alcohol use: Not Currently    Comment: occasional   Drug use: Never   Sexual activity: Yes    Birth control/protection: I.U.D.  Other Topics Concern   Not on file  Social History Narrative   Lives with parents   Looking for own place   Tries to walk daily   Social Determinants of Health   Financial Resource Strain: Medium Risk (07/29/2022)   Overall Financial Resource Strain (CARDIA)    Difficulty of Paying Living Expenses: Somewhat hard  Food Insecurity: No Food Insecurity (07/29/2022)   Hunger Vital Sign    Worried About Running Out of Food in the Last Year: Never true    Ran Out of Food in the Last Year: Never true  Transportation Needs: No Transportation Needs (07/29/2022)   PRAPARE - Administrator, Civil Service (Medical): No    Lack of Transportation (Non-Medical): No  Physical Activity: Inactive (07/29/2022)   Exercise Vital Sign    Days of Exercise per Week: 0 days    Minutes of Exercise per Session: 10 min  Stress: Stress Concern Present (07/29/2022)   [employer] of Occupational Health - Occupational Stress Questionnaire    Feeling of Stress : Rather much  Social Connections: Moderately Isolated (07/29/2022)   Social Connection and Isolation Panel [NHANES]    Frequency of Communication with Friends and Family: Three times a week    Frequency of Social Gatherings with Friends and Family: More than three times a week    Attends Religious  Services: More than 4 times per year    Active Member of [employer] or Organizations: No    Attends Banker Meetings: Never    Marital Status: Divorced     Review of Systems   Gen: Denies fever, chills, anorexia. Denies fatigue, weakness, weight loss.  CV: Denies chest pain, palpitations, syncope, peripheral edema, and claudication. Resp: Denies dyspnea at rest, cough, wheezing, coughing up blood, and pleurisy. GI: See HPI Derm: Denies rash,  itching, dry skin Psych: Denies depression, anxiety, memory loss, confusion. No homicidal or suicidal ideation.  Heme: Denies bruising, bleeding, and enlarged lymph nodes.   Physical Exam   There were no vitals taken for this visit.  General:   Alert and oriented. No distress noted. Pleasant and cooperative.  Head:  Normocephalic and atraumatic. Eyes:  Conjuctiva clear without scleral icterus. Mouth:  Oral mucosa pink and moist. Good dentition. No lesions. Lungs:  Clear to auscultation bilaterally. No wheezes, rales, or rhonchi. No distress.  Heart:  S1, S2 present without murmurs appreciated.  Abdomen:  +BS, soft, non-tender and non-distended. No rebound or guarding. No HSM or masses noted. Rectal: *** Msk:  Symmetrical without gross deformities. Normal posture. Extremities:  Without edema. Neurologic:  Alert and  oriented x4 Psych:  Alert and cooperative. Normal mood and affect.   Assessment  Kristin [name] is a 42 y.o. female with a history of *** presenting today with   GERD, nausea:  Constipation, bloating, hemorrhoids:  PLAN   ***     Brooke Bonito, MSN, FNP-BC, AGACNP-BC [facility]

## 2023-01-19 ENCOUNTER — Ambulatory Visit: Payer: BC Managed Care – PPO | Admitting: Gastroenterology

## 2023-01-23 ENCOUNTER — Telehealth: Payer: Self-pay | Admitting: Orthopaedic Surgery

## 2023-01-23 NOTE — Telephone Encounter (Signed)
Dr. Sanjuan Dame pt - pt lvm to change her appointment for tomorrow, her kids have dental appointments.  I got her reschedule.  She is stating that PT never called her so she called them and got an appointment for 02/08/23, but they are saying they have not gotten a referral from [place] yet.

## 2023-01-24 ENCOUNTER — Ambulatory Visit: Payer: BC Managed Care – PPO | Admitting: Orthopaedic Surgery

## 2023-01-24 NOTE — Telephone Encounter (Signed)
PT order faxed again to [facility]

## 2023-01-25 NOTE — Telephone Encounter (Signed)
Please encourage the patient to schedule an office visit

## 2023-01-26 ENCOUNTER — Ambulatory Visit: Payer: BC Managed Care – PPO | Admitting: Family Medicine

## 2023-01-26 NOTE — Telephone Encounter (Signed)
scheduled

## 2023-01-27 ENCOUNTER — Ambulatory Visit: Payer: BC Managed Care – PPO | Admitting: Family Medicine

## 2023-01-30 NOTE — Progress Notes (Signed)
GI Office Note    Referring Provider: Gilmore Laroche, FNP Primary Care Physician:  Gilmore Laroche, FNP Primary Gastroenterologist: Gerrit Friends.Rourk, MD  Date:  01/31/2023  ID:  Kristin [name], DOB 17-Feb-1981, MRN [id number]   Chief Complaint   Chief Complaint  Patient presents with   Abdominal Pain    Patient here today with Left sided abdominal pain that starts in the upper portion of her abdominal and radiates downward. Patient also says she has some rectal pressure at times. She is also is having issues with her reflux. She is using pantoprazole 40 mg bid and famotidine Qhs prn. She says she has also had some issues with light colored loose stools.    History of Present Illness  Nyha Pado is a 42 y.o. female with a history of constipation, anxiety, GERD, migraines  presenting today with ongoing stomach discomfort.  Last office visit 11/21/22. Unable to get Linzess covered. Trulance too effective - taking every other day. On PPI BID. Trying to limit spicy foods and eating late. Has breakthrough once weekly with nausea and belching. Not using pepcid. Using tums. Reportedly feeling terrible since having COVID and stopping paxlovid. Having rectal fullness that comes and goes, possible hemorrhoids. No brbpr or itching. Advised to continue trulance and PPI BID. Use pepcid as needed. Anusol BID for 1 week. Follow GERD diet.   12/07/22: Patient reported stomach issues, bloating, gas, nausea, decreased appetite.  Feel like fireworks going off in her stomach at times when she gets up in the morning.  Also very fatigued and no energy.  Left-sided pain that radiates into her back.  Also reported liquidy yellow stools.  Stopped using Trulance.  Sent in Amitiza for her to use for constipation.  BMP 12/21/22: 134, Calcium 8.8  Today:  Having some looser stools even without taking the Amitiza. This morning did have some harder stools. Has been trying to stay on her fiber and drink more water.  Not taking Amitiza. Taking trulance.  Not having crampy abdominal pain. Has pain in the LUQ what is described as pinching.   Has been feeling shaking and tired all the time. Almost feels like muscle weakness. Has been trying to push herself but feels terrible most days.   Sometimes feels as though reflux is controlled but at times feels like someone is squeezing her throat. Has been cutting back on spicy foods. Does not eat any fried foods. No eating late at night. No dysphagia. Little nausea, no vomiting.   Has been trying to exercise to help with gas movement. Avoids dairy products. Drinks almond milk.   Feels as though turmeric is helping some.   Current Outpatient Medications  Medication Sig Dispense Refill   famotidine (PEPCID) 20 MG tablet Take 20 mg by mouth at bedtime. prn     ibuprofen (ADVIL) 200 MG tablet Take 200 mg by mouth every 6 (six) hours as needed.     levonorgestrel (MIRENA) 20 MCG/24HR IUD 1 each by Intrauterine route once.     pantoprazole (PROTONIX) 40 MG tablet Take 1 tablet (40 mg total) by mouth 2 (two) times daily before a meal. 60 tablet 5   rizatriptan (MAXALT) 10 MG tablet May repeat in 2 hours if needed 9 tablet 11   Turmeric (QC TUMERIC COMPLEX PO) Take by mouth daily at 6 (six) AM.     topiramate (TOPAMAX) 25 MG tablet Take 1 pill in AM and 2 pills in PM for one week, then increase to 2 pills twice  a day 120 tablet 6   No current facility-administered medications for this visit.    Past Medical History:  Diagnosis Date   Allergy    Anxiety    Atypical squamous cell changes of undetermined significance (ASCUS) on cervical cytology with negative high risk human papilloma virus (HPV) test result 03/30/2020   6/21 repeat in 3 years ASCCP guidelines 5 year risk CIN 3+ 0.27%   Carpal tunnel syndrome of right wrist 10/22/2018   Contraceptive management 11/05/2013   Depression    Encounter for well woman exam with routine gynecological exam 07/27/2022   GERD  (gastroesophageal reflux disease)    IUD (intrauterine device) in place 01/13/2016   Migraines    Missed periods 01/13/2016   Vaginal Pap smear, abnormal     Past Surgical History:  Procedure Laterality Date   COLONOSCOPY N/A 05/10/2019   Normal. next colonoscopy 05/2029   ESOPHAGOGASTRODUODENOSCOPY N/A 05/10/2019   Hiatal hernia   NO PAST SURGERIES      Family History  Problem Relation Age of Onset   Multiple myeloma Paternal Grandfather    Cancer Paternal Grandmother        leukemia   Colon cancer Maternal Grandmother    Hypertension Father    Hyperlipidemia Father    Cancer Father        prostate   Other Mother        enlarged heart   Diabetes Mother    Hypertension Mother    Hyperlipidemia Mother    Heart disease Mother        heart murmer   Miscarriages / Stillbirths Mother    Breast cancer Mother    Other Brother        enlarged heart; colloid cyst of the third ventricle( of the brain)   Hypertension Sister    Colon cancer Maternal Aunt    Gastric cancer Maternal Uncle    Esophageal cancer Maternal Uncle 43   Stomach cancer Maternal Uncle     Allergies as of 01/31/2023 - Review Complete 01/31/2023  Allergen Reaction Noted   Amoxicillin Swelling 08/04/2020   Nurtec [rimegepant sulfate]  10/27/2022   Penicillins Swelling 05/03/2020   Venlafaxine  08/24/2020    Social History   Socioeconomic History   Marital status: Divorced    Spouse name: Not on file   Number of children: 3   Years of education: 14   Highest education level: Not on file  Occupational History   Occupation: Public house manager    Comment: [employer]  Tobacco Use   Smoking status: Never    Passive exposure: Never   Smokeless tobacco: Never  Vaping Use   Vaping Use: Never used  Substance and Sexual Activity   Alcohol use: Not Currently    Comment: occasional   Drug use: Never   Sexual activity: Yes    Birth control/protection: I.U.D.  Other Topics Concern   Not on file  Social  History Narrative   Lives with parents   Looking for own place   Tries to walk daily   Social Determinants of Health   Financial Resource Strain: Medium Risk (07/29/2022)   Overall Financial Resource Strain (CARDIA)    Difficulty of Paying Living Expenses: Somewhat hard  Food Insecurity: No Food Insecurity (07/29/2022)   Hunger Vital Sign    Worried About Running Out of Food in the Last Year: Never true    Ran Out of Food in the Last Year: Never true  Transportation Needs: No Transportation Needs (  07/29/2022)   PRAPARE - Administrator, Civil Service (Medical): No    Lack of Transportation (Non-Medical): No  Physical Activity: Inactive (07/29/2022)   Exercise Vital Sign    Days of Exercise per Week: 0 days    Minutes of Exercise per Session: 10 min  Stress: Stress Concern Present (07/29/2022)   [employer] of Occupational Health - Occupational Stress Questionnaire    Feeling of Stress : Rather much  Social Connections: Moderately Isolated (07/29/2022)   Social Connection and Isolation Panel [NHANES]    Frequency of Communication with Friends and Family: Three times a week    Frequency of Social Gatherings with Friends and Family: More than three times a week    Attends Religious Services: More than 4 times per year    Active Member of [employer] or Organizations: No    Attends Banker Meetings: Never    Marital Status: Divorced     Review of Systems   Gen: Denies fever, chills, anorexia. Denies fatigue, weakness, weight loss.  CV: Denies chest pain, palpitations, syncope, peripheral edema, and claudication. Resp: Denies dyspnea at rest, cough, wheezing, coughing up blood, and pleurisy. GI: See HPI Derm: Denies rash, itching, dry skin Psych: Denies depression, anxiety, memory loss, confusion. No homicidal or suicidal ideation.  Heme: Denies bruising, bleeding, and enlarged lymph nodes.   Physical Exam   BP 120/82 (BP Location: Left Arm, Patient  Position: Sitting, Cuff Size: Large)   Pulse 99   Temp 97.8 F (36.6 C) (Temporal)   Ht 5\' 4"  (1.626 m)   Wt 272 lb (123.4 kg)   BMI 46.69 kg/m   General:   Alert and oriented. No distress noted. Pleasant and cooperative.  Head:  Normocephalic and atraumatic. Eyes:  Conjuctiva clear without scleral icterus. Mouth:  Oral mucosa pink and moist. Good dentition. No lesions. Lungs:  Clear to auscultation bilaterally. No wheezes, rales, or rhonchi. No distress.  Heart:  S1, S2 present without murmurs appreciated.  Abdomen:  +BS, soft, non-distended. Ttp to LUQ. No rebound or guarding. No HSM or masses noted. Rectal: deferred Msk:  Symmetrical without gross deformities. Normal posture. Extremities:  Without edema. Neurologic:  Alert and  oriented x4 Psych:  Alert and cooperative. Normal mood and affect.   Assessment  Kristin [name] is a 42 y.o. female with a history of constipation, anxiety, GERD, migraines  presenting today with ongoing stomach discomfort.  GERD, LUQ pain, nausea: Avoids dairy products but continues to experience bloating. Exercise is not helping. GERD not well controlled. Having intermittent nausea without vomiting. Reports stools have been a yellow color in nature recently. Suspect uncontrolled acid as etiology of yellow stools. Will stop pantoprazole and trial Nexium 40 mg BID. Will assess for etiology of LUQ pain and nausea with CT scan. Also advised low FODMAP diet and will assess celiac panel. Advised to continue gas ex as needed. May consider EGD if CT negative and no improvement with change in PPI.   Fatigue, muscle weakness: Feeling worse after having COVID and being treated with Paxlovid. Has constant fatigue and weakness. Will check CBC, CMP, TSH, PTH and anemia panel.   Alternating constipation/diarrhea: Takes Trulance in the mornings, works within an hour or hour and a half. Has about 3 Bms and then has improvement with discomfort and rectal pressure.   PLAN    CBC, CMP, celiac, TSH, B12, folate, iron panel, PTH Low FODMAP diet, start elimination process.  CT A/P Continue gas ex as needed  Stop pantoprazole, start Nexium 40 mg twice daily Follow up in 6-8 weeks    Brooke Bonito, MSN, FNP-BC, AGACNP-BC [facility]

## 2023-01-31 ENCOUNTER — Ambulatory Visit (HOSPITAL_COMMUNITY)
Admission: RE | Admit: 2023-01-31 | Discharge: 2023-01-31 | Disposition: A | Discharge status: Home / Self Care | Payer: BC Managed Care – PPO | Source: Ambulatory Visit | Attending: Family Medicine | Admitting: Family Medicine

## 2023-01-31 ENCOUNTER — Encounter: Payer: Self-pay | Admitting: Gastroenterology

## 2023-01-31 ENCOUNTER — Telehealth (INDEPENDENT_AMBULATORY_CARE_PROVIDER_SITE_OTHER): Payer: Self-pay | Admitting: Gastroenterology

## 2023-01-31 ENCOUNTER — Ambulatory Visit (INDEPENDENT_AMBULATORY_CARE_PROVIDER_SITE_OTHER): Payer: BC Managed Care – PPO | Admitting: Gastroenterology

## 2023-01-31 VITALS — BP 120/82 | HR 99 | Temp 97.8°F | Ht 64.0 in | Wt 272.0 lb

## 2023-01-31 DIAGNOSIS — R5383 Other fatigue: Secondary | ICD-10-CM | POA: Diagnosis not present

## 2023-01-31 DIAGNOSIS — N6489 Other specified disorders of breast: Secondary | ICD-10-CM | POA: Diagnosis not present

## 2023-01-31 DIAGNOSIS — R140 Abdominal distension (gaseous): Secondary | ICD-10-CM

## 2023-01-31 DIAGNOSIS — N6324 Unspecified lump in the left breast, lower inner quadrant: Secondary | ICD-10-CM | POA: Insufficient documentation

## 2023-01-31 DIAGNOSIS — R92312 Mammographic fatty tissue density, left breast: Secondary | ICD-10-CM | POA: Diagnosis not present

## 2023-01-31 DIAGNOSIS — K5900 Constipation, unspecified: Secondary | ICD-10-CM | POA: Diagnosis not present

## 2023-01-31 DIAGNOSIS — R1012 Left upper quadrant pain: Secondary | ICD-10-CM

## 2023-01-31 DIAGNOSIS — M6281 Muscle weakness (generalized): Secondary | ICD-10-CM | POA: Diagnosis not present

## 2023-01-31 DIAGNOSIS — R110 Nausea: Secondary | ICD-10-CM

## 2023-01-31 DIAGNOSIS — N6320 Unspecified lump in the left breast, unspecified quadrant: Secondary | ICD-10-CM

## 2023-01-31 DIAGNOSIS — K219 Gastro-esophageal reflux disease without esophagitis: Secondary | ICD-10-CM

## 2023-01-31 LAB — CBC
Hemoglobin: 12.8 g/dL (ref 11.7–15.5)
MCH: 26.7 pg — ABNORMAL LOW (ref 27.0–33.0)
MCHC: 32.4 g/dL (ref 32.0–36.0)
Platelets: 283 10*3/uL (ref 140–400)
WBC: 5.2 10*3/uL (ref 3.8–10.8)

## 2023-01-31 MED ORDER — ESOMEPRAZOLE MAGNESIUM 40 MG PO CPDR: 40.0000 mg | DELAYED_RELEASE_CAPSULE | Freq: Two times a day (BID) | ORAL | 3 refills | Status: DC

## 2023-01-31 NOTE — Telephone Encounter (Signed)
CT Abd/Pelvis w/Contrast approved via [employer]  Order [id number] Approval Valid Through 01/31/23-03/01/23   Pt scheduled for 02/04/23 at 1:00pm. Pt will need to arrive at [facility] at 10:45 am. Will begin drinking contrast at 11:00pm for a 1:00pm scan.   [facility] [address] [place] [address] [phone]

## 2023-01-31 NOTE — Telephone Encounter (Signed)
Pt left voicemail returning call Attempted to reach pt but had to leave voicemail. Left detailed message and also sent my chart message

## 2023-01-31 NOTE — Telephone Encounter (Signed)
Left message to return call 

## 2023-01-31 NOTE — Patient Instructions (Signed)
We will schedule you for a CT scan of your abdomen and pelvis to further evaluate your abdominal pain.  Please have labs completed at [facility].  Once received results we will be in touch with any further recommendations.  Follow a low FODMAP diet.  Work toward eliminating a few foods at a time and then reintroduce to see if you have improvement or worsening of symptoms.  Stop pantoprazole.  Start Nexium 40 mg twice daily, 30 minutes prior to breakfast and dinner. Continue to avoid trigger foods  Continue Gas-X and famotidine as needed.  If you need to take Gas-X scheduled once or twice daily that is fine to help with gassiness/bloating.  I will have you follow-up in 6 as 8 weeks, sooner if needed.  Please not hesitate to reach out via MyChart if you have any questions or concerns.  It was a pleasure to see you today. I want to create trusting relationships with patients. If you receive a survey regarding your visit,  I greatly appreciate you taking time to fill this out on paper or through your MyChart. I value your feedback.  Brooke Bonito, MSN, FNP-BC, AGACNP-BC [facility]

## 2023-02-01 LAB — COMPREHENSIVE METABOLIC PANEL
AG Ratio: 1.5 (calc) (ref 1.0–2.5)
ALT: 13 U/L (ref 6–29)
AST: 13 U/L (ref 10–30)
Albumin: 4 g/dL (ref 3.6–5.1)
Alkaline phosphatase (APISO): 107 U/L (ref 31–125)
BUN: 8 mg/dL (ref 7–25)
CO2: 28 mmol/L (ref 20–32)
Calcium: 9 mg/dL (ref 8.6–10.2)
Chloride: 103 mmol/L (ref 98–110)
Creat: 0.64 mg/dL (ref 0.50–0.99)
Globulin: 2.6 g/dL (calc) (ref 1.9–3.7)
Glucose, Bld: 92 mg/dL (ref 65–139)
Potassium: 4.1 mmol/L (ref 3.5–5.3)
Sodium: 138 mmol/L (ref 135–146)
Total Bilirubin: 0.7 mg/dL (ref 0.2–1.2)
Total Protein: 6.6 g/dL (ref 6.1–8.1)

## 2023-02-01 LAB — IRON,TIBC AND FERRITIN PANEL
%SAT: 33 % (calc) (ref 16–45)
Ferritin: 47 ng/mL (ref 16–232)
Iron: 99 ug/dL (ref 40–190)
TIBC: 298 mcg/dL (calc) (ref 250–450)

## 2023-02-01 LAB — CELIAC DISEASE PANEL
(tTG) Ab, IgA: <1 U/mL
(tTG) Ab, IgG: <1 U/mL
Gliadin IgA: 26.6 U/mL — ABNORMAL HIGH
Gliadin IgG: <1 U/mL
Immunoglobulin A: 261 mg/dL (ref 47–310)

## 2023-02-01 LAB — B12 AND FOLATE PANEL
Folate: 11.4 ng/mL
Vitamin B-12: 259 pg/mL (ref 200–1100)

## 2023-02-01 LAB — TSH+FREE T4: TSH W/REFLEX TO FT4: 2.47 mIU/L

## 2023-02-01 LAB — PTH, INTACT AND CALCIUM
Calcium: 9 mg/dL (ref 8.6–10.2)
PTH: 44 pg/mL (ref 16–77)

## 2023-02-01 LAB — CBC
HCT: 39.5 % (ref 35.0–45.0)
MCV: 82.5 fL (ref 80.0–100.0)
MPV: 10.9 fL (ref 7.5–12.5)
RBC: 4.79 10*6/uL (ref 3.80–5.10)
RDW: 12.3 % (ref 11.0–15.0)

## 2023-02-04 ENCOUNTER — Ambulatory Visit (HOSPITAL_BASED_OUTPATIENT_CLINIC_OR_DEPARTMENT_OTHER)
Admission: RE | Admit: 2023-02-04 | Discharge: 2023-02-04 | Disposition: A | Discharge status: Home / Self Care | Payer: BC Managed Care – PPO | Source: Ambulatory Visit | Attending: Gastroenterology | Admitting: Gastroenterology

## 2023-02-04 DIAGNOSIS — R109 Unspecified abdominal pain: Secondary | ICD-10-CM | POA: Diagnosis not present

## 2023-02-04 DIAGNOSIS — R1012 Left upper quadrant pain: Secondary | ICD-10-CM | POA: Diagnosis not present

## 2023-02-04 DIAGNOSIS — N200 Calculus of kidney: Secondary | ICD-10-CM | POA: Diagnosis not present

## 2023-02-04 MED ORDER — IOHEXOL 300 MG/ML  SOLN
100.0000 mL | Freq: Once | INTRAMUSCULAR | Status: AC | PRN
  Administered 2023-02-04: 100 mL via INTRAVENOUS

## 2023-02-15 ENCOUNTER — Ambulatory Visit: Payer: BC Managed Care – PPO | Admitting: Orthopaedic Surgery

## 2023-02-15 ENCOUNTER — Ambulatory Visit: Payer: BC Managed Care – PPO | Admitting: Family Medicine

## 2023-03-08 ENCOUNTER — Ambulatory Visit: Payer: BC Managed Care – PPO | Admitting: Family Medicine

## 2023-03-10 ENCOUNTER — Ambulatory Visit: Payer: BC Managed Care – PPO | Admitting: Family Medicine

## 2023-03-28 ENCOUNTER — Ambulatory Visit: Payer: BC Managed Care – PPO | Admitting: Gastroenterology

## 2023-03-31 ENCOUNTER — Ambulatory Visit: Payer: BC Managed Care – PPO | Admitting: Student

## 2023-04-05 ENCOUNTER — Ambulatory Visit: Payer: BC Managed Care – PPO | Admitting: Orthopaedic Surgery

## 2023-04-10 NOTE — Progress Notes (Unsigned)
GI Office Note    Referring Provider: Gilmore Laroche, FNP Primary Care Physician:  Gilmore Laroche, FNP Primary Gastroenterologist: Gerrit Friends.Rourk, MD  Date:  04/11/2023  ID:  Kristin [name], DOB [birth year], MRN [id number]   Chief Complaint   Chief Complaint  Patient presents with   Follow-up    Follow up. No problems    History of Present Illness  Kristin [name] is a 42 y.o. female with a history of constipation, anxiety, GERD, migraines presenting today for follow up.   EGD August 2020: - Normal esophagus. - Hiatal hernia. - The examination was otherwise normal. - Normal duodenal bulb and second portion of the duodenum.  Colonoscopy August 2020: - The entire examined colon is normal. - The distal rectum and anal verge are normal on retroflexion view. - No specimens collected. Entire colon seen well. Of note, patient's bowel symptoms have improved on daily Benefiber  Office visit 11/21/22. Unable to get Linzess covered. Trulance too effective - taking every other day. On PPI BID. Trying to limit spicy foods and eating late. Has breakthrough once weekly with nausea and belching. Not using pepcid. Using tums. Reportedly feeling terrible since having COVID and stopping paxlovid. Having rectal fullness that comes and goes, possible hemorrhoids. No brbpr or itching. Advised to continue trulance and PPI BID. Use pepcid as needed. Anusol BID for 1 week. Follow GERD diet.    On 12/07/22: Patient reported stomach issues, bloating, gas, nausea, decreased appetite.  Feel like fireworks going off in her stomach at times when she gets up in the morning.  Also very fatigued and no energy.  Left-sided pain that radiates into her back.  Also reported liquidy yellow stools.  Stopped using Trulance.  Sent in Amitiza for her to use for constipation.  Last office visit 01/31/2023.  Had looser stools and without Amitiza was also having some intermittent harder stools.  Had been trying to stay on  fiber and drinking more water.  Went back to taking Trulance.  Not having any cramping abdominal pain but did have some pain in the left upper quadrant described as pinching.  Also feeling shaky and tired all the time with some muscle weakness but overall feeling terrible.  Reflux controlled for the most part but at times feel like someone squeezing her throat.  I cut back on spicy foods and not eating any fried foods.  Denied any dysphagia.  Some nausea but no vomiting.  Has been trying to exercise to help with gas movement and is avoiding dairy products is much as able but also taking turmeric which she thought was helping her symptoms some. Check CBC, celiac labs, TSH, B12, folate, iron panel, PTH. CT ordered. Low  FODMAP diet recommended. Gas ex as needed. Start nexium in place of pantoprazole.  Labs 01/31/23: Celiac panel with positive Gliadin IgA, normal IgA, negative Ttg IgA. Thyroid normal, parathyroid normal. Anemia panel, B12, folate normal. Advised to follow gluten free diet to see if symptoms improve.   CT A/P 02/04/23: -No acute findings -IUD in adequate position.   Today:  GERD - has improved some as well. Stopped turmeric. Still taking it twice daily.   Still not completely gluten free. Has been trying as many gluten free options as possible. When she does not eat much gluten she does feel better and can tell a difference when she does not adhere. Was having headaches. Not waking up jittery anymore or feeling terrible. Not as nauseas anymore and not as overly fatigued anymore  and no more dizzy spells.   Constipation - Has had less diarrhea and not really struggling with constipation but doing well on her Trulance.      Current Outpatient Medications  Medication Sig Dispense Refill   famotidine (PEPCID) 20 MG tablet Take 20 mg by mouth at bedtime. prn     ibuprofen (ADVIL) 200 MG tablet Take 200 mg by mouth every 6 (six) hours as needed.     levonorgestrel (MIRENA) 20 MCG/24HR IUD 1  each by Intrauterine route once.     Plecanatide (TRULANCE) 3 MG TABS Take by mouth.     rizatriptan (MAXALT) 10 MG tablet May repeat in 2 hours if needed 9 tablet 11   esomeprazole (NEXIUM) 40 MG capsule Take 1 capsule (40 mg total) by mouth 2 (two) times daily before a meal. (Patient not taking: Reported on 04/11/2023) 60 capsule 3   topiramate (TOPAMAX) 25 MG tablet Take 1 pill in AM and 2 pills in PM for one week, then increase to 2 pills twice a day (Patient not taking: Reported on 04/11/2023) 120 tablet 6   Turmeric (QC TUMERIC COMPLEX PO) Take by mouth daily at 6 (six) AM. (Patient not taking: Reported on 04/11/2023)     No current facility-administered medications for this visit.    Past Medical History:  Diagnosis Date   Allergy    Anxiety    Atypical squamous cell changes of undetermined significance (ASCUS) on cervical cytology with negative high risk human papilloma virus (HPV) test result 03/30/2020   6/21 repeat in 3 years ASCCP guidelines 5 year risk CIN 3+ 0.27%   Carpal tunnel syndrome of right wrist 10/22/2018   Contraceptive management 11/05/2013   Depression    Encounter for well woman exam with routine gynecological exam 07/27/2022   GERD (gastroesophageal reflux disease)    IUD (intrauterine device) in place 01/13/2016   Migraines    Missed periods 01/13/2016   Vaginal Pap smear, abnormal     Past Surgical History:  Procedure Laterality Date   COLONOSCOPY N/A 05/10/2019   Normal. next colonoscopy 05/2029   ESOPHAGOGASTRODUODENOSCOPY N/A 05/10/2019   Hiatal hernia   NO PAST SURGERIES      Family History  Problem Relation Age of Onset   Multiple myeloma Paternal Grandfather    Cancer Paternal Grandmother        leukemia   Colon cancer Maternal Grandmother    Hypertension Father    Hyperlipidemia Father    Cancer Father        prostate   Other Mother        enlarged heart   Diabetes Mother    Hypertension Mother    Hyperlipidemia Mother    Heart disease  Mother        heart murmer   Miscarriages / Stillbirths Mother    Breast cancer Mother    Other Brother        enlarged heart; colloid cyst of the third ventricle( of the brain)   Hypertension Sister    Colon cancer Maternal Aunt    Gastric cancer Maternal Uncle    Esophageal cancer Maternal Uncle 78   Stomach cancer Maternal Uncle     Allergies as of 04/11/2023 - Review Complete 04/11/2023  Allergen Reaction Noted   Amoxicillin Swelling 08/04/2020   Nurtec [rimegepant sulfate]  10/27/2022   Penicillins Swelling 05/03/2020   Venlafaxine  08/24/2020    Social History   Socioeconomic History   Marital status: Divorced    Spouse  name: Not on file   Number of children: 3   Years of education: 14   Highest education level: Not on file  Occupational History   Occupation: program assistant    Comment: [employer]  Tobacco Use   Smoking status: Never    Passive exposure: Never   Smokeless tobacco: Never  Vaping Use   Vaping Use: Never used  Substance and Sexual Activity   Alcohol use: Not Currently    Comment: occasional   Drug use: Never   Sexual activity: Yes    Birth control/protection: I.U.D.  Other Topics Concern   Not on file  Social History Narrative   Lives with parents   Looking for own place   Tries to walk daily   Social Determinants of Health   Financial Resource Strain: Medium Risk (07/29/2022)   Overall Financial Resource Strain (CARDIA)    Difficulty of Paying Living Expenses: Somewhat hard  Food Insecurity: No Food Insecurity (07/29/2022)   Hunger Vital Sign    Worried About Running Out of Food in the Last Year: Never true    Ran Out of Food in the Last Year: Never true  Transportation Needs: No Transportation Needs (07/29/2022)   PRAPARE - Administrator, Civil Service (Medical): No    Lack of Transportation (Non-Medical): No  Physical Activity: Inactive (07/29/2022)   Exercise Vital Sign    Days of Exercise per Week: 0 days     Minutes of Exercise per Session: 10 min  Stress: Stress Concern Present (07/29/2022)   [employer] of Occupational Health - Occupational Stress Questionnaire    Feeling of Stress : Rather much  Social Connections: Moderately Isolated (07/29/2022)   Social Connection and Isolation Panel [NHANES]    Frequency of Communication with Friends and Family: Three times a week    Frequency of Social Gatherings with Friends and Family: More than three times a week    Attends Religious Services: More than 4 times per year    Active Member of [employer] or Organizations: No    Attends Banker Meetings: Never    Marital Status: Divorced   Review of Systems   Gen: Denies fever, chills, anorexia. Denies fatigue, weakness, weight loss.  CV: Denies chest pain, palpitations, syncope, peripheral edema, and claudication. Resp: Denies dyspnea at rest, cough, wheezing, coughing up blood, and pleurisy. GI: See HPI Derm: Denies rash, itching, dry skin Psych: Denies depression, anxiety, memory loss, confusion. No homicidal or suicidal ideation.  Heme: Denies bruising, bleeding, and enlarged lymph nodes.  Physical Exam   BP 116/77 (BP Location: Left Arm, Patient Position: Sitting, Cuff Size: Large)   Pulse 71   Temp 97.7 F (36.5 C) (Temporal)   Ht 5\' 4"  (1.626 m)   Wt 271 lb 12.8 oz (123.3 kg)   SpO2 96%   BMI 46.65 kg/m   General:   Alert and oriented. No distress noted. Pleasant and cooperative.  Head:  Normocephalic and atraumatic. Eyes:  Conjuctiva clear without scleral icterus. Mouth:  Oral mucosa pink and moist. Good dentition. No lesions. Lungs:  Clear to auscultation bilaterally. No wheezes, rales, or rhonchi. No distress.  Heart:  S1, S2 present without murmurs appreciated.  Abdomen:  +BS, soft, non-tender and non-distended. No rebound or guarding. No HSM or masses noted. Rectal: deferred Msk:  Symmetrical without gross deformities. Normal posture. Extremities:  Without  edema. Neurologic:  Alert and  oriented x4 Psych:  Alert and cooperative. Normal mood and affect.  Assessment  Mable Oros is a 42 y.o. female with a history of constipation, anxiety, GERD, migraines presenting today for follow up.   GERD, nausea, bloating, gluten sensitivity: Maintained on Nexium twice daily.  Has had some improvement of GERD symptoms since following gluten-free diet.  Still has an occasional nausea but improved as well.  Celiac panel negative call with positive gliadin IgA normal TTG IgA and no evidence of anemia.  CT scan also without any acute findings.  Has been working on following a gluten-free diet although has not 100% consistent with avoidance of gluten but she has had a significantly improved quality of life since. Ideally biopsy confirmation should be confirmed but given she is having good control of symptoms while still working on diet we an defer from now and consider repeating if symptoms worsen.   Constipation,hemorrhoids: Doing well on Trulance daily.  Has had some occasional diarrhea but this is usually in the setting of dietary indiscretion. Will continue with current regimen.   PLAN   Continue gluten free diet Trulance 3 mg daily. Refilled today.  Continue Nexium 40 mg twice daily, plan to wean in the future to once daily if able. Follow up 6 months.     Kristin Bonito, MSN, FNP-BC, AGACNP-BC [facility]

## 2023-04-11 ENCOUNTER — Encounter: Payer: Self-pay | Admitting: Gastroenterology

## 2023-04-11 ENCOUNTER — Ambulatory Visit (INDEPENDENT_AMBULATORY_CARE_PROVIDER_SITE_OTHER): Payer: BC Managed Care – PPO | Admitting: Gastroenterology

## 2023-04-11 VITALS — BP 116/77 | HR 71 | Temp 97.7°F | Ht 64.0 in | Wt 271.8 lb

## 2023-04-11 DIAGNOSIS — R140 Abdominal distension (gaseous): Secondary | ICD-10-CM

## 2023-04-11 DIAGNOSIS — K5900 Constipation, unspecified: Secondary | ICD-10-CM

## 2023-04-11 DIAGNOSIS — K219 Gastro-esophageal reflux disease without esophagitis: Secondary | ICD-10-CM | POA: Diagnosis not present

## 2023-04-11 DIAGNOSIS — R110 Nausea: Secondary | ICD-10-CM

## 2023-04-11 DIAGNOSIS — K9041 Non-celiac gluten sensitivity: Secondary | ICD-10-CM

## 2023-04-11 MED ORDER — TRULANCE 3 MG PO TABS: 3.0000 mg | ORAL_TABLET | Freq: Every day | ORAL | 5 refills | Status: DC

## 2023-04-11 NOTE — Patient Instructions (Addendum)
Continue your best to work toward a gluten-free diet.  Very pleased to see you are feeling better!  Continue with Trulance 3 mg once daily  Continue with Nexium 40 mg twice daily.  When you get close to running out of her current supply if you want to try reducing to once daily to see if you have worsening symptoms then I would recommend that and if you have return of symptoms within a couple weeks then you can resume twice daily dosing.  We will plan to follow-up in 6 months, sooner if needed.  It was a pleasure to see you today. I want to create trusting relationships with patients. If you receive a survey regarding your visit,  I greatly appreciate you taking time to fill this out on paper or through your MyChart. I value your feedback.  Brooke Bonito, MSN, FNP-BC, AGACNP-BC [facility]

## 2023-04-24 NOTE — Progress Notes (Deleted)
  Cardiology Office Note:  .   Date:  04/24/2023  ID:  Kristin [name], DOB 04/07/1981, MRN [id number] PCP: Gilmore Laroche, FNP  [facility] Providers Cardiologist:  Charlton Haws, MD { Click to update primary MD,subspecialty MD or APP then REFRESH:1}   History of Present Illness: Kristin [name]   Kristin [name] is a 42 y.o. female ***  ROS: ***  Studies Reviewed: Kristin [name]         Prior CV Studies: {Select studies to display:26339}  Echo 11/2022  1. Left ventricular ejection fraction, by estimation, is 55 to 60%. The  left ventricle has normal function. The left ventricle has no regional  wall motion abnormalities. There is mild concentric left ventricular  hypertrophy. Left ventricular diastolic  parameters are indeterminate.   2. Right ventricular systolic function is normal. The right ventricular  size is normal. There is normal pulmonary artery systolic pressure. The  estimated right ventricular systolic pressure is 24.0 mmHg.   3. The mitral valve is grossly normal. Trivial mitral valve  regurgitation.   4. The aortic valve is tricuspid. Aortic valve regurgitation is not  visualized. No aortic stenosis is present. Aortic valve mean gradient  measures 6.0 mmHg.   5. The inferior vena cava is normal in size with greater than 50%  respiratory variability, suggesting right atrial pressure of 3 mmHg.   Comparison(s): No prior Echocardiogram.    Heart monitor 11/2022 Patch Wear Time:  6 days and 14 hours (2024-01-29T16:35:46-0500 to 2024-02-05T07:11:24-0500)   Patient had a min HR of 49 bpm, max HR of 154 bpm, and avg HR of 74 bpm. Predominant underlying rhythm was Sinus Rhythm. Isolated SVEs were rare (<1.0%), SVE Couplets were rare (<1.0%), and SVE Triplets were rare (<1.0%). Isolated VEs were rare (<1.0%),  and no VE Couplets or VE Triplets were present. Ventricular Bigeminy was present.    Charlton Haws MD [facility]    Risk Assessment/Calculations:   {Does this patient have ATRIAL  FIBRILLATION?:[phone]} No BP recorded.  {Refresh Note OR Click here to enter BP  :1}***       Physical Exam:   VS:  There were no vitals taken for this visit.   Wt Readings from Last 3 Encounters:  04/11/23 271 lb 12.8 oz (123.3 kg)  01/31/23 272 lb (123.4 kg)  01/12/23 266 lb 1.9 oz (120.7 kg)    GEN: Well nourished, well developed in no acute distress NECK: No JVD; No carotid bruits CARDIAC: ***RRR, no murmurs, rubs, gallops RESPIRATORY:  Clear to auscultation without rales, wheezing or rhonchi  ABDOMEN: Soft, non-tender, non-distended EXTREMITIES:  No edema; No deformity   ASSESSMENT AND PLAN: .   Palpitations Patient reports palpitations, DOE and atypical chest pain since COVID in January 2024, which all may be residual from this. Heart monitor showed NSR with rare PACs and PVCs. Patient was given propranolol to take as needed, but has not taken it.    DOE Patient reports improving shortness of breath. Echo showed normal LVEF with normal diastolic parameters.   Atypical chest pain Patient reports persistent atypical chest pain. We discussed noninvasive testing, but decided to watch and wait at this time. We will reassess symptoms at follow-up.      {Are you ordering a CV Procedure (e.g. stress test, cath, DCCV, TEE, etc)?   Press F2        :[id number]}  Dispo: ***  Signed, Jacolyn Reedy, PA-C

## 2023-05-01 ENCOUNTER — Encounter: Payer: Self-pay | Admitting: Family Medicine

## 2023-05-01 ENCOUNTER — Ambulatory Visit: Payer: BC Managed Care – PPO | Admitting: Family Medicine

## 2023-05-01 VITALS — BP 129/75 | HR 63 | Ht 64.0 in | Wt 270.1 lb

## 2023-05-01 DIAGNOSIS — M62838 Other muscle spasm: Secondary | ICD-10-CM | POA: Diagnosis not present

## 2023-05-01 MED ORDER — CYCLOBENZAPRINE HCL 5 MG PO TABS
5.0000 mg | ORAL_TABLET | Freq: Every day | ORAL | 1 refills | Status: DC
Start: 2023-05-01 — End: 2023-08-03

## 2023-05-01 NOTE — Progress Notes (Unsigned)
   CC:  headaches  Follow-up Visit  Last visit: 09/23/22  Brief HPI: 42 year old female with a history of GERD who follows in clinic for migraines.  At her last visit, Topamax was increased to 50 mg BID. She was given Nurtec samples for rescue. Referral to neck PT was sent.  Interval History: Headaches*** Topamax*** Nurtec made her nauseated. PT***   Headache days per month: *** Migraine days per month*** Headache free days per month: ***  Current Headache Regimen: Preventative: *** Abortive: ***   Prior Therapies                                  Rescue: Flexeril 10 mg QHS Maxalt 10 mg PRN Imitrex Nurtec - nausea  Prevention: Topamax 25 mg BID Elavil 10 mg QHS Cymbalta 30 mg daily Zoloft 50 gm daily - tremor  Physical Exam:   Vital Signs: There were no vitals taken for this visit. GENERAL:  well appearing, in no acute distress, alert  SKIN:  Color, texture, turgor normal. No rashes or lesions HEAD:  Normocephalic/atraumatic. RESP: normal respiratory effort MSK:  No gross joint deformities.   NEUROLOGICAL: Mental Status: Alert, oriented to person, place and time, Follows commands, and Speech fluent and appropriate. Cranial Nerves: PERRL, face symmetric, no dysarthria, hearing grossly intact Motor: moves all extremities equally Gait: normal-based.  IMPRESSION: ***  PLAN: ***   Follow-up: ***  I spent a total of *** minutes on the date of the service. Headache education was done. Discussed lifestyle modification including increased oral hydration, decreased caffeine, exercise and stress management. Discussed treatment options including preventive and acute medications, natural supplements, and infusion therapy. Discussed medication overuse headache and to limit use of acute treatments to no more than 2 days/week or 10 days/month. Discussed medication side effects, adverse reactions and drug interactions. Written educational materials and patient  instructions outlining all of the above were given.  Ocie Doyne, MD

## 2023-05-01 NOTE — Patient Instructions (Addendum)
I appreciate the opportunity to provide care to you today!    Follow up:  3 months  -flexeril can cause drowsiness, recommend taking at bedtime -maintain good neck posture while sitting -heat/cold therapy Apply heat to the affected area such as a moist heat pack or a heating pad. Place a towel between your skin and the heat source. Leave the heat on for 20-30 minutes. Remove the heat if your skin turns bright red. This is especially important if you are unable to feel pain, heat, or cold. You may have a greater risk of getting burned. Apply  ice on the painful area. To do this: If you have a removable splint, remove it as told by your health care provider. Put ice in a plastic bag. Place a towel between your skin and the bag or between your splint and the bag. Leave the ice on for 20 minutes, 2-3 times a day.      Attached with your AVS, you will find valuable resources for self-education. I highly recommend dedicating some time to thoroughly examine them.   Please continue to a heart-healthy diet and increase your physical activities. Try to exercise for at least five days a week.    It was a pleasure to see you and I look forward to continuing to work together on your health and well-being. Please do not hesitate to call the office if you need care or have questions about your care.  In case of emergency, please visit the Emergency Department for urgent care, or contact our clinic at [phone] to schedule an appointment. We're here to help you!   Have a wonderful day and week. With Gratitude, Gilmore Laroche MSN, FNP-BC

## 2023-05-01 NOTE — Progress Notes (Signed)
Established Patient Office Visit  Subjective:  Patient ID: Kristin [name], female    DOB: 1980-11-30  Age: 42 y.o. MRN: [id number]  CC:  Chief Complaint  Patient presents with   Mass    Pt reports swollen lymph node on left side of her neck.     HPI Kristin [name] is a 42 y.o. female  presents with c/o swollen lymph nodes of the left side of her neck. C/o tightness in her neck. No fever,chills, night sweats and fatigue.  For the details of today's visit, please refer to the assessment and plan.     Past Medical History:  Diagnosis Date   Allergy    Anxiety    Atypical squamous cell changes of undetermined significance (ASCUS) on cervical cytology with negative high risk human papilloma virus (HPV) test result 03/30/2020   6/21 repeat in 3 years ASCCP guidelines 5 year risk CIN 3+ 0.27%   Carpal tunnel syndrome of right wrist 10/22/2018   Contraceptive management 11/05/2013   Depression    Encounter for well woman exam with routine gynecological exam 07/27/2022   GERD (gastroesophageal reflux disease)    IUD (intrauterine device) in place 01/13/2016   Migraines    Missed periods 01/13/2016   Vaginal Pap smear, abnormal     Past Surgical History:  Procedure Laterality Date   COLONOSCOPY N/A 05/10/2019   Normal. next colonoscopy 05/2029   ESOPHAGOGASTRODUODENOSCOPY N/A 05/10/2019   Hiatal hernia   NO PAST SURGERIES      Family History  Problem Relation Age of Onset   Multiple myeloma Paternal Grandfather    Cancer Paternal Grandmother        leukemia   Colon cancer Maternal Grandmother    Hypertension Father    Hyperlipidemia Father    Cancer Father        prostate   Other Mother        enlarged heart   Diabetes Mother    Hypertension Mother    Hyperlipidemia Mother    Heart disease Mother        heart murmer   Miscarriages / Stillbirths Mother    Breast cancer Mother    Other Brother        enlarged heart; colloid cyst of the third ventricle( of the  brain)   Hypertension Sister    Colon cancer Maternal Aunt    Gastric cancer Maternal Uncle    Esophageal cancer Maternal Uncle 3   Stomach cancer Maternal Uncle     Social History   Socioeconomic History   Marital status: Divorced    Spouse name: Not on file   Number of children: 3   Years of education: 14   Highest education level: Not on file  Occupational History   Occupation: Public house manager    Comment: [employer]  Tobacco Use   Smoking status: Never    Passive exposure: Never   Smokeless tobacco: Never  Vaping Use   Vaping status: Never Used  Substance and Sexual Activity   Alcohol use: Not Currently    Comment: occasional   Drug use: Never   Sexual activity: Yes    Birth control/protection: I.U.D.  Other Topics Concern   Not on file  Social History Narrative   Lives with parents   Looking for own place   Tries to walk daily   Social Determinants of Health   Financial Resource Strain: Medium Risk (07/29/2022)   Overall Financial Resource Strain (CARDIA)    Difficulty of Paying  Living Expenses: Somewhat hard  Food Insecurity: No Food Insecurity (07/29/2022)   Hunger Vital Sign    Worried About Running Out of Food in the Last Year: Never true    Ran Out of Food in the Last Year: Never true  Transportation Needs: No Transportation Needs (07/29/2022)   PRAPARE - Administrator, Civil Service (Medical): No    Lack of Transportation (Non-Medical): No  Physical Activity: Inactive (07/29/2022)   Exercise Vital Sign    Days of Exercise per Week: 0 days    Minutes of Exercise per Session: 10 min  Stress: Stress Concern Present (07/29/2022)   [employer] of Occupational Health - Occupational Stress Questionnaire    Feeling of Stress : Rather much  Social Connections: Moderately Isolated (07/29/2022)   Social Connection and Isolation Panel [NHANES]    Frequency of Communication with Friends and Family: Three times a week    Frequency of  Social Gatherings with Friends and Family: More than three times a week    Attends Religious Services: More than 4 times per year    Active Member of [employer] or Organizations: No    Attends Banker Meetings: Never    Marital Status: Divorced  Catering manager Violence: Not At Risk (07/29/2022)   Humiliation, Afraid, Rape, and Kick questionnaire    Fear of Current or Ex-Partner: No    Emotionally Abused: No    Physically Abused: No    Sexually Abused: No    Outpatient Medications Prior to Visit  Medication Sig Dispense Refill   esomeprazole (NEXIUM) 40 MG capsule Take 1 capsule (40 mg total) by mouth 2 (two) times daily before a meal. 60 capsule 3   famotidine (PEPCID) 20 MG tablet Take 20 mg by mouth at bedtime. prn     ibuprofen (ADVIL) 200 MG tablet Take 200 mg by mouth every 6 (six) hours as needed.     levonorgestrel (MIRENA) 20 MCG/24HR IUD 1 each by Intrauterine route once.     Plecanatide (TRULANCE) 3 MG TABS Take 1 tablet (3 mg total) by mouth daily. 30 tablet 5   rizatriptan (MAXALT) 10 MG tablet May repeat in 2 hours if needed 9 tablet 11   topiramate (TOPAMAX) 25 MG tablet Take 1 pill in AM and 2 pills in PM for one week, then increase to 2 pills twice a day 120 tablet 6   Turmeric (QC TUMERIC COMPLEX PO) Take by mouth daily at 6 (six) AM.     No facility-administered medications prior to visit.    Allergies  Allergen Reactions   Amoxicillin Swelling   Nurtec [Rimegepant Sulfate]     Made her sick with vomiting and it only eased my headache a little    Penicillins Swelling   Venlafaxine     Other reaction(s): heart palpitations    ROS Review of Systems  Constitutional:  Negative for chills and fever.  Eyes:  Negative for visual disturbance.  Respiratory:  Negative for chest tightness and shortness of breath.   Neurological:  Negative for dizziness and headaches.      Objective:    Physical Exam HENT:     Head: Normocephalic.     Mouth/Throat:      Mouth: Mucous membranes are moist.  Neck:     Thyroid: No thyroid mass.  Cardiovascular:     Rate and Rhythm: Normal rate.     Heart sounds: Normal heart sounds.  Pulmonary:     Effort: Pulmonary effort is  normal.     Breath sounds: Normal breath sounds.  Lymphadenopathy:     Cervical:     Right cervical: No superficial, deep or posterior cervical adenopathy.    Left cervical: No superficial, deep or posterior cervical adenopathy.  Neurological:     Mental Status: She is alert.     BP 129/75   Pulse 63   Ht 5\' 4"  (1.626 m)   Wt 270 lb 1.3 oz (122.5 kg)   SpO2 91%   BMI 46.36 kg/m  Wt Readings from Last 3 Encounters:  05/03/23 270 lb 3.2 oz (122.6 kg)  05/01/23 270 lb 1.3 oz (122.5 kg)  04/11/23 271 lb 12.8 oz (123.3 kg)    Lab Results  Component Value Date   TSH 1.514 10/31/2022   Lab Results  Component Value Date   WBC 5.2 01/31/2023   HGB 12.8 01/31/2023   HCT 39.5 01/31/2023   MCV 82.5 01/31/2023   PLT 283 01/31/2023   Lab Results  Component Value Date   NA 138 01/31/2023   K 4.1 01/31/2023   CO2 28 01/31/2023   GLUCOSE 92 01/31/2023   BUN 8 01/31/2023   CREATININE 0.64 01/31/2023   BILITOT 0.7 01/31/2023   ALKPHOS 109 07/24/2022   AST 13 01/31/2023   ALT 13 01/31/2023   PROT 6.6 01/31/2023   ALBUMIN 3.7 07/24/2022   CALCIUM 9.0 01/31/2023   CALCIUM 9.0 01/31/2023   ANIONGAP 5 12/21/2022   Lab Results  Component Value Date   CHOL 148 12/05/2022   Lab Results  Component Value Date   HDL 42 12/05/2022   Lab Results  Component Value Date   LDLCALC 94 12/05/2022   Lab Results  Component Value Date   TRIG 58 12/05/2022   Lab Results  Component Value Date   CHOLHDL 3.5 12/05/2022   Lab Results  Component Value Date   HGBA1C 5.4 12/05/2022      Assessment & Plan:  Muscle spasm Assessment & Plan: No tender lymph node palpated Tightness with palpation of the cervical spine Will treat today with flexeril; encouraged to take at  bedtime Encouraged supportive care with good posture, application of heat/cold therapy to the affected site and cervical spine exercises   Orders: -     Cyclobenzaprine HCl; Take 1 tablet (5 mg total) by mouth at bedtime.  Dispense: 30 tablet; Refill: 1   Note: This chart has been completed using Engineer, civil (consulting) software, and while attempts have been made to ensure accuracy, certain words and phrases may not be transcribed as intended.   Follow-up: Return in about 3 months (around 08/01/2023).   Gilmore Laroche, FNP

## 2023-05-03 ENCOUNTER — Ambulatory Visit: Payer: BC Managed Care – PPO | Admitting: Psychiatry

## 2023-05-03 ENCOUNTER — Encounter: Payer: Self-pay | Admitting: Psychiatry

## 2023-05-03 VITALS — BP 120/73 | HR 65 | Ht 64.0 in | Wt 270.2 lb

## 2023-05-03 DIAGNOSIS — G43119 Migraine with aura, intractable, without status migrainosus: Secondary | ICD-10-CM | POA: Diagnosis not present

## 2023-05-05 DIAGNOSIS — M62838 Other muscle spasm: Secondary | ICD-10-CM | POA: Insufficient documentation

## 2023-05-05 NOTE — Assessment & Plan Note (Signed)
No tender lymph node palpated Tightness with palpation of the cervical spine Will treat today with flexeril; encouraged to take at bedtime Encouraged supportive care with good posture, application of heat/cold therapy to the affected site and cervical spine exercises

## 2023-05-08 ENCOUNTER — Ambulatory Visit: Payer: BC Managed Care – PPO | Admitting: Physician Assistant

## 2023-06-02 ENCOUNTER — Encounter: Payer: Self-pay | Admitting: Family Medicine

## 2023-06-02 ENCOUNTER — Telehealth (INDEPENDENT_AMBULATORY_CARE_PROVIDER_SITE_OTHER): Payer: BC Managed Care – PPO | Admitting: Family Medicine

## 2023-06-02 VITALS — Ht 64.0 in | Wt 270.0 lb

## 2023-06-02 DIAGNOSIS — J0111 Acute recurrent frontal sinusitis: Secondary | ICD-10-CM | POA: Diagnosis not present

## 2023-06-02 DIAGNOSIS — J0110 Acute frontal sinusitis, unspecified: Secondary | ICD-10-CM | POA: Insufficient documentation

## 2023-06-02 MED ORDER — PREDNISONE 20 MG PO TABS
20.0000 mg | ORAL_TABLET | Freq: Two times a day (BID) | ORAL | 0 refills | Status: AC
Start: 2023-06-02 — End: 2023-06-07

## 2023-06-02 MED ORDER — AZITHROMYCIN 250 MG PO TABS
ORAL_TABLET | ORAL | 0 refills | Status: DC
Start: 2023-06-02 — End: 2023-06-19

## 2023-06-02 MED ORDER — AZELASTINE-FLUTICASONE 137-50 MCG/ACT NA SUSP: 1.0000 | Freq: Two times a day (BID) | NASAL | 1 refills | Status: DC

## 2023-06-02 NOTE — Assessment & Plan Note (Signed)
Azelastine-Fluticasone 137-50 mcg nasal spray Prednisone 20 twice a day x 5 days, azithromycin 250 mg x 5 days. Advise symptomatic treatment, rest, increase oral fluid intake. Take OTC tylenol for fever or pain. Follow-up for worsening or persistent symptoms. Patient verbalizes understanding regarding plan of care and all questions answered

## 2023-06-02 NOTE — Progress Notes (Signed)
Virtual Visit via Video Note  I connected with Kristin [name] on 06/02/23 at 10:00 AM EDT by a video enabled telemedicine application and verified that I am speaking with the correct person using two identifiers.  Patient Location: Home Provider Location: Office/Clinic  I discussed the limitations, risks, security, and privacy concerns of performing an evaluation and management service by video and the availability of in person appointments. I also discussed with the patient that there may be a patient responsible charge related to this service. The patient expressed understanding and agreed to proceed.  Subjective: PCP: Gilmore Laroche, FNP  Chief Complaint  Patient presents with   Sinus Problem    sinus infection.  I have had a headache off and on since Monday with jaws and ear aching with a runny nose at times.    Kristin [name] 42 year old female, present via telehealth. Patient describes symptoms of headache and malaise,headaches, jaw ache, left ear pain frontal sinus pressure, facial pain, runny nose   Symptoms began 4 days ago and are unchanged since that time. Patient denies cough, dyspnea, chest pain, or nausea and vomiting. Treatment thus far includes OTC analgesics/antipyretics: somewhat effective       ROS: Per HPI  Current Outpatient Medications:    Azelastine-Fluticasone 137-50 MCG/ACT SUSP, Place 1 spray into the nose every 12 (twelve) hours., Disp: 23 g, Rfl: 1   azithromycin (ZITHROMAX) 250 MG tablet, Take 2 tablets on day 1, then 1 tablet daily on days 2 through 5, Disp: 6 tablet, Rfl: 0   famotidine (PEPCID) 20 MG tablet, Take 20 mg by mouth at bedtime. prn, Disp: , Rfl:    Plecanatide (TRULANCE) 3 MG TABS, Take 1 tablet (3 mg total) by mouth daily., Disp: 30 tablet, Rfl: 5   predniSONE (DELTASONE) 20 MG tablet, Take 1 tablet (20 mg total) by mouth 2 (two) times daily with a meal for 5 days., Disp: 10 tablet, Rfl: 0   rizatriptan (MAXALT) 10 MG tablet, May  repeat in 2 hours if needed, Disp: 9 tablet, Rfl: 11   topiramate (TOPAMAX) 25 MG tablet, Take 1 pill in AM and 2 pills in PM for one week, then increase to 2 pills twice a day, Disp: 120 tablet, Rfl: 6   Turmeric (QC TUMERIC COMPLEX PO), Take by mouth daily at 6 (six) AM., Disp: , Rfl:    cyclobenzaprine (FLEXERIL) 5 MG tablet, Take 1 tablet (5 mg total) by mouth at bedtime. (Patient not taking: Reported on 06/02/2023), Disp: 30 tablet, Rfl: 1   esomeprazole (NEXIUM) 40 MG capsule, Take 1 capsule (40 mg total) by mouth 2 (two) times daily before a meal. (Patient not taking: Reported on 06/02/2023), Disp: 60 capsule, Rfl: 3   ibuprofen (ADVIL) 200 MG tablet, Take 200 mg by mouth every 6 (six) hours as needed. (Patient not taking: Reported on 06/02/2023), Disp: , Rfl:    levonorgestrel (MIRENA) 20 MCG/24HR IUD, 1 each by Intrauterine route once. (Patient not taking: Reported on 06/02/2023), Disp: , Rfl:   Observations/Objective: Today's Vitals   06/02/23 0958  Weight: 270 lb (122.5 kg)  Height: 5\' 4"  (1.626 m)  PainSc: 3   PainLoc: Head   Physical Exam Patient is alert and no acute distress noted.   Assessment and Plan: Acute recurrent frontal sinusitis Assessment & Plan: Azelastine-Fluticasone 137-50 mcg nasal spray Prednisone 20 twice a day x 5 days, azithromycin 250 mg x 5 days. Advise symptomatic treatment, rest, increase oral fluid intake. Take OTC tylenol for  fever or pain. Follow-up for worsening or persistent symptoms. Patient verbalizes understanding regarding plan of care and all questions answered   Orders: -     predniSONE; Take 1 tablet (20 mg total) by mouth 2 (two) times daily with a meal for 5 days.  Dispense: 10 tablet; Refill: 0 -     Azelastine-Fluticasone; Place 1 spray into the nose every 12 (twelve) hours.  Dispense: 23 g; Refill: 1 -     Azithromycin; Take 2 tablets on day 1, then 1 tablet daily on days 2 through 5  Dispense: 6 tablet; Refill: 0    Follow Up  Instructions: No follow-ups on file.   I discussed the assessment and treatment plan with the patient. The patient was provided an opportunity to ask questions, and all were answered. The patient agreed with the plan and demonstrated an understanding of the instructions.   The patient was advised to call back or seek an in-person evaluation if the symptoms worsen or if the condition fails to improve as anticipated.  The above assessment and management plan was discussed with the patient. The patient verbalized understanding of and has agreed to the management plan.   Cruzita Lederer Newman Nip, FNP

## 2023-06-09 ENCOUNTER — Ambulatory Visit: Payer: BC Managed Care – PPO | Admitting: Physician Assistant

## 2023-06-13 ENCOUNTER — Telehealth: Payer: Self-pay | Admitting: *Deleted

## 2023-06-13 NOTE — Telephone Encounter (Signed)
Received approval for Trulance 3 mg. Sent copy to scan center.

## 2023-06-16 NOTE — Progress Notes (Unsigned)
Cardiology Office Note    Date:  06/19/2023  ID:  Kristin [name], DOB [birth year], MRN [id number] PCP:  Gilmore Laroche, FNP  Cardiologist:  Charlton Haws, MD  Electrophysiologist:  None   Chief Complaint: f/u palpitations, chest discomfort  History of Present Illness: Kristin [name]    Kristin [name] is a 42 y.o. female with visit-pertinent history of  GERD, anxiety, depression, migraines, palpitations with rare PACs/PVCs presents for evaluation of palpitations. She remotely saw Dr. Eden Emms in the past with atypical chest pain felt related to anxiety and palpitations with monitor 2021 avg HR 77, <1% PACs/PVCs. I met her in 10/2022 when she was presenting with palpitations, SOB and left leg pain that had begun after diagnosis of Covid. D-dimer was mildly abnormal. LE venous duplex was negative. CTA was negative for PE. Repeat monitor showed range 49-154bpm, average HR 74bpm, rare PACs/PVCs <1% burden with occasional ventricular bigeminy. I offered trial of proranolol given concomitant h/o migraines. 2D echo showed EF 55-60%, trivial MR. She was seen in follow-up 12/2022 by Cadence Furth PA-C. She had opted not to take propranolol. The patient also reported atypical chest pain. Per notes, she elected to defer testing at that time. She returns for follow-up overall stable. She is definitely feeling better than what she had in the beginning of the year, but developed another URI recently (virtual visit 8/30) and required a round of abx and prednisone. She finished up the steroid last week. Similar to the last time around, she developed a vague sense of chest pressure/fullness with this. It does not feel exactly like congestion though she wonders if there is some relationship to nasal drainage. It is not specifically painful. She also notes this makes her feel more winded when it happens. She denies any anginal-sounding chest pain with exertion. The palpitations continue to occur intermittently and she is interested in  pursuing trial of medication for them. She describes them as a fluttering associated with a sensation that she is traveling over a hill. No syncope, edema, dizziness. Her mother had issues with hyperthyroid and almost developed heart failure with this.   Labwork independently reviewed: 01/2023 TSH wnl, K 4.1, Cr 0.64, LFTs ok, Hgb 12.8, plt 283 12/2022 LDL 94, trig 58 followed by PCP, A1c 5.4 10/2022 d-dimer 0.52, TSH, CBC OK, MG 2.0  ROS: .    Please see the history of present illness.  All other systems are reviewed and otherwise negative.  Studies Reviewed: Kristin [name]    EKG:  EKG is ordered today, personally reviewed, demonstrating NSR with sinus arrhythmia 72bpm, no acute STT changes  CV Studies: Cardiac studies reviewed are outlined and summarized above. Otherwise please see EMR for full report.   Current Reported Medications:.    Current Meds  Medication Sig   Azelastine-Fluticasone 137-50 MCG/ACT SUSP Place 1 spray into the nose every 12 (twelve) hours.   cyclobenzaprine (FLEXERIL) 5 MG tablet Take 1 tablet (5 mg total) by mouth at bedtime.   esomeprazole (NEXIUM) 40 MG capsule Take 1 capsule (40 mg total) by mouth 2 (two) times daily before a meal.   famotidine (PEPCID) 20 MG tablet Take 20 mg by mouth at bedtime. prn   ibuprofen (ADVIL) 200 MG tablet Take 200 mg by mouth every 6 (six) hours as needed.   levonorgestrel (MIRENA) 20 MCG/24HR IUD 1 each by Intrauterine route once.   Plecanatide (TRULANCE) 3 MG TABS Take 1 tablet (3 mg total) by mouth daily.   rizatriptan (MAXALT) 10 MG tablet May repeat  in 2 hours if needed   topiramate (TOPAMAX) 25 MG tablet Take 1 pill in AM and 2 pills in PM for one week, then increase to 2 pills twice a day   Turmeric (QC TUMERIC COMPLEX PO) Take by mouth daily at 6 (six) AM.    Physical Exam:    VS:  BP 132/80 (BP Location: Right Arm, Patient Position: Sitting, Cuff Size: Large)   Pulse 78   Ht 5\' 4"  (1.626 m)   Wt 275 lb 12.8 oz (125.1 kg)    SpO2 98%   BMI 47.34 kg/m    Wt Readings from Last 3 Encounters:  06/19/23 275 lb 12.8 oz (125.1 kg)  06/02/23 270 lb (122.5 kg)  05/03/23 270 lb 3.2 oz (122.6 kg)    GEN: Well nourished, well developed in no acute distress NECK: No JVD; No carotid bruits CARDIAC: RRR, no murmurs, rubs, gallops RESPIRATORY:  Clear to auscultation without rales, wheezing or rhonchi  ABDOMEN: Soft, non-tender, non-distended EXTREMITIES:  No edema; No acute deformity   Asessement and Plan:.    1. Palpitations with rare PACs/PVCs and ventricular bigeminy - per shared decision making she would like to trial a short acting PRN version of medication to take if palpitations flare up. Will rx propranolol 20mg  BID PRN. If she requires daily therapy in the future, would consolidate to propranolol LA 60mg . This medication was chosen given concomitant hx of migraine headaches.  2. Atypical chest pain, DOE - symptoms sound related to URI but have waxed and waned over the last few months. No high risk red flag symptoms. May be component of deconditioning as well. EKG reassuring. Echo earlier this year reassuring. She would like to pursue further eval. We will set up for ETT next week, giving her a little bit more time to recover from this recent URI. Lungs are clear and she is well appearing today. We will also update labs to ensure nothing else out of the ordinary going on (BMET, CBC, TSH, BNP).  Informed Consent   Shared Decision Making/Informed Consent The risks [chest pain, shortness of breath, cardiac arrhythmias, dizziness, blood pressure fluctuations, myocardial infarction, stroke/transient ischemic attack, and life-threatening complications (estimated to be 1 in 10,000)], benefits (risk stratification, diagnosing coronary artery disease, treatment guidance) and alternatives of an exercise tolerance test were discussed in detail with Ms. Woolever and she agrees to proceed.    3. Borderline high blood pressure - SBP  slightly above goal today, usually normal. She did finish up a round of prednisone recently, unclear if this was contributing. Her BP is usually normal. We discussed monitoring via home cuff. Can also assess BP response to exercise.    Disposition: F/u in 1 year with Dr. Eden Emms or me, sooner if testing abnormal or palpitations require further med titration.  Signed, Laurann Montana, PA-C

## 2023-06-19 ENCOUNTER — Ambulatory Visit: Payer: BC Managed Care – PPO | Admitting: Physician Assistant

## 2023-06-19 ENCOUNTER — Encounter: Payer: Self-pay | Admitting: Physician Assistant

## 2023-06-19 ENCOUNTER — Ambulatory Visit: Payer: BC Managed Care – PPO | Attending: Physician Assistant | Admitting: Physician Assistant

## 2023-06-19 VITALS — BP 132/80 | HR 78 | Ht 64.0 in | Wt 275.8 lb

## 2023-06-19 DIAGNOSIS — I491 Atrial premature depolarization: Secondary | ICD-10-CM | POA: Diagnosis not present

## 2023-06-19 DIAGNOSIS — R002 Palpitations: Secondary | ICD-10-CM

## 2023-06-19 DIAGNOSIS — I493 Ventricular premature depolarization: Secondary | ICD-10-CM

## 2023-06-19 DIAGNOSIS — R0789 Other chest pain: Secondary | ICD-10-CM

## 2023-06-19 DIAGNOSIS — R0609 Other forms of dyspnea: Secondary | ICD-10-CM

## 2023-06-19 DIAGNOSIS — R030 Elevated blood-pressure reading, without diagnosis of hypertension: Secondary | ICD-10-CM

## 2023-06-19 NOTE — Patient Instructions (Signed)
Medication Instructions:  Your physician recommends that you continue on your current medications as directed. Please refer to the Current Medication list given to you today.  *If you need a refill on your cardiac medications before your next appointment, please call your pharmacy*   Lab Work: BNP CBC TSH  If you have labs (blood work) drawn today and your tests are completely normal, you will receive your results only by: MyChart Message (if you have MyChart) OR A paper copy in the mail If you have any lab test that is abnormal or we need to change your treatment, we will call you to review the results.   Testing/Procedures: Your physician has requested that you have an exercise tolerance test. For further information please visit [email or web address] Please also follow instruction sheet, as given.    Follow-Up: At [facility], you and your health needs are our priority.  As part of our continuing mission to provide you with exceptional heart care, we have created designated Provider Care Teams.  These Care Teams include your primary Cardiologist (physician) and Advanced Practice Providers (APPs -  Physician Assistants and Nurse Practitioners) who all work together to provide you with the care you need, when you need it.  We recommend signing up for the patient portal called "MyChart".  Sign up information is provided on this After Visit Summary.  MyChart is used to connect with patients for Virtual Visits (Telemedicine).  Patients are able to view lab/test results, encounter notes, upcoming appointments, etc.  Non-urgent messages can be sent to your provider as well.   To learn more about what you can do with MyChart, go to [email or web address].    Your next appointment:   1 year(s)  Provider:   You may see Charlton Haws, MD or one of the following Advanced Practice Providers on your designated Care Team:   Ronie Spies, [place]    Other Instructions

## 2023-06-21 ENCOUNTER — Other Ambulatory Visit (HOSPITAL_COMMUNITY)
Admission: RE | Admit: 2023-06-21 | Discharge: 2023-06-21 | Disposition: A | Discharge status: Home / Self Care | Payer: BC Managed Care – PPO | Source: Ambulatory Visit | Attending: Physician Assistant | Admitting: Physician Assistant

## 2023-06-21 DIAGNOSIS — R0789 Other chest pain: Secondary | ICD-10-CM | POA: Insufficient documentation

## 2023-06-21 DIAGNOSIS — I493 Ventricular premature depolarization: Secondary | ICD-10-CM | POA: Insufficient documentation

## 2023-06-21 DIAGNOSIS — R030 Elevated blood-pressure reading, without diagnosis of hypertension: Secondary | ICD-10-CM | POA: Diagnosis not present

## 2023-06-21 DIAGNOSIS — R002 Palpitations: Secondary | ICD-10-CM | POA: Insufficient documentation

## 2023-06-21 DIAGNOSIS — I491 Atrial premature depolarization: Secondary | ICD-10-CM | POA: Diagnosis not present

## 2023-06-21 DIAGNOSIS — R0609 Other forms of dyspnea: Secondary | ICD-10-CM | POA: Insufficient documentation

## 2023-06-21 LAB — CBC
HCT: 39.4 % (ref 36.0–46.0)
Hemoglobin: 12.5 g/dL (ref 12.0–15.0)
MCH: 27.4 pg (ref 26.0–34.0)
MCHC: 31.7 g/dL (ref 30.0–36.0)
MCV: 86.2 fL (ref 80.0–100.0)
Platelets: 259 10*3/uL (ref 150–400)
RBC: 4.57 MIL/uL (ref 3.87–5.11)
RDW: 13.2 % (ref 11.5–15.5)
WBC: 5.9 10*3/uL (ref 4.0–10.5)
nRBC: 0 % (ref 0.0–0.2)

## 2023-06-21 LAB — BRAIN NATRIURETIC PEPTIDE: B Natriuretic Peptide: 22 pg/mL (ref 0.0–100.0)

## 2023-06-21 LAB — TSH: TSH: 3.143 u[IU]/mL (ref 0.350–4.500)

## 2023-06-27 ENCOUNTER — Ambulatory Visit (HOSPITAL_COMMUNITY): Payer: BC Managed Care – PPO

## 2023-07-03 ENCOUNTER — Ambulatory Visit (HOSPITAL_COMMUNITY)
Admission: RE | Admit: 2023-07-03 | Discharge: 2023-07-03 | Disposition: A | Discharge status: Home / Self Care | Payer: BC Managed Care – PPO | Source: Ambulatory Visit | Attending: Family Medicine | Admitting: Family Medicine

## 2023-07-03 DIAGNOSIS — R0789 Other chest pain: Secondary | ICD-10-CM | POA: Diagnosis not present

## 2023-07-03 LAB — EXERCISE TOLERANCE TEST
Angina Index: 0
Duke Treadmill Score: 6
Estimated workload: 7
Exercise duration (min): 6 min
Exercise duration (sec): 0 s
MPHR: 176 {beats}/min
Peak HR: 157 {beats}/min
Percent HR: 89 %
RPE: 13
Rest HR: 76 {beats}/min
ST Depression (mm): 0 mm

## 2023-07-26 ENCOUNTER — Other Ambulatory Visit: Payer: Self-pay | Admitting: Gastroenterology

## 2023-07-26 DIAGNOSIS — R5383 Other fatigue: Secondary | ICD-10-CM

## 2023-07-26 DIAGNOSIS — K9041 Non-celiac gluten sensitivity: Secondary | ICD-10-CM

## 2023-07-27 ENCOUNTER — Other Ambulatory Visit: Payer: Self-pay | Admitting: Gastroenterology

## 2023-07-27 DIAGNOSIS — K9041 Non-celiac gluten sensitivity: Secondary | ICD-10-CM

## 2023-07-27 DIAGNOSIS — R5383 Other fatigue: Secondary | ICD-10-CM

## 2023-07-28 DIAGNOSIS — R5383 Other fatigue: Secondary | ICD-10-CM | POA: Diagnosis not present

## 2023-07-28 DIAGNOSIS — K9041 Non-celiac gluten sensitivity: Secondary | ICD-10-CM | POA: Diagnosis not present

## 2023-07-28 DIAGNOSIS — E538 Deficiency of other specified B group vitamins: Secondary | ICD-10-CM | POA: Diagnosis not present

## 2023-07-29 LAB — CBC
HCT: 39.9 % (ref 35.0–45.0)
Hemoglobin: 12.7 g/dL (ref 11.7–15.5)
MCH: 27.4 pg (ref 27.0–33.0)
MCHC: 31.8 g/dL — ABNORMAL LOW (ref 32.0–36.0)
MCV: 86 fL (ref 80.0–100.0)
MPV: 11.1 fL (ref 7.5–12.5)
Platelets: 283 10*3/uL (ref 140–400)
RBC: 4.64 10*6/uL (ref 3.80–5.10)
RDW: 11.5 % (ref 11.0–15.0)
WBC: 5.9 10*3/uL (ref 3.8–10.8)

## 2023-07-29 LAB — IRON,TIBC AND FERRITIN PANEL
%SAT: 26 % (ref 16–45)
Ferritin: 57 ng/mL (ref 16–232)
Iron: 81 ug/dL (ref 40–190)
TIBC: 311 ug/dL (ref 250–450)

## 2023-07-29 LAB — BASIC METABOLIC PANEL WITH GFR
BUN: 9 mg/dL (ref 7–25)
CO2: 30 mmol/L (ref 20–32)
Calcium: 9 mg/dL (ref 8.6–10.2)
Chloride: 104 mmol/L (ref 98–110)
Creat: 0.64 mg/dL (ref 0.50–0.99)
Glucose, Bld: 82 mg/dL (ref 65–99)
Potassium: 4.5 mmol/L (ref 3.5–5.3)
Sodium: 140 mmol/L (ref 135–146)
eGFR: 112 mL/min/{1.73_m2} (ref >=60)

## 2023-07-29 LAB — VITAMIN B12: Vitamin B-12: 264 pg/mL (ref 200–1100)

## 2023-07-29 LAB — VITAMIN D 25 HYDROXY (VIT D DEFICIENCY, FRACTURES): Vit D, 25-Hydroxy: 39 ng/mL (ref 30–100)

## 2023-08-03 ENCOUNTER — Ambulatory Visit: Payer: BC Managed Care – PPO | Admitting: Adult Health

## 2023-08-03 ENCOUNTER — Encounter: Payer: Self-pay | Admitting: Adult Health

## 2023-08-03 VITALS — BP 104/72 | HR 82 | Ht 64.0 in | Wt 275.0 lb

## 2023-08-03 DIAGNOSIS — Z01419 Encounter for gynecological examination (general) (routine) without abnormal findings: Secondary | ICD-10-CM

## 2023-08-03 DIAGNOSIS — T8332XA Displacement of intrauterine contraceptive device, initial encounter: Secondary | ICD-10-CM | POA: Insufficient documentation

## 2023-08-03 DIAGNOSIS — T8332XD Displacement of intrauterine contraceptive device, subsequent encounter: Secondary | ICD-10-CM

## 2023-08-03 DIAGNOSIS — Z1331 Encounter for screening for depression: Secondary | ICD-10-CM

## 2023-08-03 DIAGNOSIS — M62838 Other muscle spasm: Secondary | ICD-10-CM

## 2023-08-03 DIAGNOSIS — Z975 Presence of (intrauterine) contraceptive device: Secondary | ICD-10-CM

## 2023-08-03 DIAGNOSIS — Z1211 Encounter for screening for malignant neoplasm of colon: Secondary | ICD-10-CM | POA: Diagnosis not present

## 2023-08-03 LAB — HEMOCCULT GUIAC POC 1CARD (OFFICE): Fecal Occult Blood, POC: NEGATIVE

## 2023-08-03 NOTE — Progress Notes (Addendum)
Patient ID: Kristin [name], female   DOB: 1981-07-27, 42 y.o.   MRN: [id number] History of Present Illness: Kristin [name] is a 42 year old black female, divorced, G3P3003, in for a well woman gyn exam. She has been having fatigue, feeling drained and shaky and has had labs with GI twice now. This seems to have started after having COVID in January.did talk about long haul COVID. And has pain on and off left shoulder, had PT after seeing Dr Hilda Lias, without relief. She has some spotting with IUD, and it is time for removal and reinsertion.  She has noted blurred vision and has eye exam appt 08/16/23.      Component Value Date/Time   DIAGPAP  03/25/2021 0957    - Negative for intraepithelial lesion or malignancy (NILM)   DIAGPAP (A) 03/25/2020 1118    - Atypical squamous cells of undetermined significance (ASC-US)   DIAGPAP  10/22/2018 0000    NEGATIVE FOR INTRAEPITHELIAL LESIONS OR MALIGNANCY.   HPVHIGH Negative 03/25/2021 0957   HPVHIGH Negative 03/25/2020 1118   ADEQPAP  03/25/2021 0957    Satisfactory for evaluation; transformation zone component PRESENT.   ADEQPAP  03/25/2020 1118    Satisfactory for evaluation; transformation zone component PRESENT.   ADEQPAP  10/22/2018 0000    Satisfactory for evaluation  endocervical/transformation zone component PRESENT.   PCP is Candie Chroman NP   Current Medications, Allergies, Past Medical History, Past Surgical History, Family History and Social History were reviewed in [employer] record.     Review of Systems: Patient denies any headaches, hearing loss,  blurred vision, shortness of breath, chest pain, abdominal pain, problems with bowel movements, urination, or intercourse(not active). No joint pain or mood swings.  See HPI for positives.    Physical Exam:BP 104/72 (BP Location: Left Arm, Patient Position: Sitting, Cuff Size: Large)   Pulse 82   Ht 5\' 4"  (1.626 m)   Wt 275 lb (124.7 kg)   BMI 47.20 kg/m    General:  Well developed, well nourished, no acute distress Skin:  Warm and dry Neck:  Midline trachea, normal thyroid, good ROM, no lymphadenopathy Lungs; Clear to auscultation bilaterally Breast:  No dominant palpable mass, retraction, or nipple discharge Cardiovascular: Regular rate and rhythm, has point tenderness left scapula  Abdomen:  Soft, non tender, no hepatosplenomegaly Pelvic:  External genitalia is normal in appearance, no lesions.  The vagina is normal in appearance. Urethra has no lesions or masses. The cervix is smooth and bulbous, no IUD strings seen (IUD in place on CT, 02/04/23). Uterus is felt to be normal size, shape, and contour.  No adnexal masses or tenderness noted.Bladder is non tender, no masses felt. Rectal: Good sphincter tone, no polyps, or hemorrhoids felt.  Hemoccult negative. Extremities/musculoskeletal:  No swelling or varicosities noted, no clubbing or cyanosis Psych:  No mood changes, alert and cooperative,seems happy AA is 1 Fall risk is moderate    08/03/2023    8:47 AM 06/02/2023   10:01 AM 05/01/2023    3:38 PM  Depression screen PHQ 2/9  Decreased Interest 0 2 1  Down, Depressed, Hopeless 1 0 0  PHQ - 2 Score 1 2 1   Altered sleeping 1 3 1   Tired, decreased energy 2 2 1   Change in appetite 0 0 0  Feeling bad or failure about yourself  0 0 0  Trouble concentrating 0 0 0  Moving slowly or fidgety/restless 0 0 0  Suicidal thoughts 0 0 0  PHQ-9  Score 4 7 3   Difficult doing work/chores   Not difficult at all       08/03/2023    8:47 AM 06/02/2023   10:02 AM 05/01/2023    3:33 PM 01/12/2023    9:57 AM  GAD 7 : Generalized Anxiety Score  Nervous, Anxious, on Edge 1 0 0 1  Control/stop worrying 0 0 0 1  Worry too much - different things 0 0 0 1  Trouble relaxing 1 2 0 0  Restless 0 0 0 0  Easily annoyed or irritable 1 2 0 1  Afraid - awful might happen 0 0 0 1  Total GAD 7 Score 3 4 0 5  Anxiety Difficulty  Somewhat difficult Not difficult at  all Somewhat difficult      Upstream - 08/03/23 2130       Pregnancy Intention Screening   Does the patient want to become pregnant in the next year? No    Does the patient's partner want to become pregnant in the next year? No    Would the patient like to discuss contraceptive options today? No      Contraception Wrap Up   Current Method Abstinence;IUD or IUS    End Method Abstinence;IUD or IUS    Contraception Counseling Provided Yes             Examination chaperoned by Malachy Mood LPN  Impression and plan: 1. Encounter for well woman exam with routine gynecological exam Pap and physical in 1 year Labs with PCP and GI Mammogram was negative 08/04/22, gt one next month  2. Encounter for screening fecal occult blood testing Hemoccult was negative   3. Muscle spasm Has point tenderness left scapula Alternat heat and ice and has flexeril at home  4. IUD (intrauterine device) in place Mirena placed 07/30/15 Return 08/14/23 for IUD removal and reinsertion,   5. Intrauterine contraceptive device threads lost, subsequent encounter No strings seen will return 08/14/23 for IUD removal and reinsertion, when Dr Despina Hidden is here, just in case can't find strings

## 2023-08-07 ENCOUNTER — Ambulatory Visit: Payer: BC Managed Care – PPO | Admitting: Family Medicine

## 2023-08-14 ENCOUNTER — Encounter: Payer: Self-pay | Admitting: Family Medicine

## 2023-08-14 ENCOUNTER — Other Ambulatory Visit: Payer: Self-pay | Admitting: Adult Health

## 2023-08-14 ENCOUNTER — Ambulatory Visit (INDEPENDENT_AMBULATORY_CARE_PROVIDER_SITE_OTHER): Payer: BC Managed Care – PPO | Admitting: Family Medicine

## 2023-08-14 ENCOUNTER — Ambulatory Visit: Payer: BC Managed Care – PPO | Admitting: Radiology

## 2023-08-14 ENCOUNTER — Ambulatory Visit: Payer: BC Managed Care – PPO | Admitting: Adult Health

## 2023-08-14 ENCOUNTER — Encounter: Payer: Self-pay | Admitting: Adult Health

## 2023-08-14 VITALS — BP 127/75 | HR 75 | Ht 64.0 in | Wt 275.1 lb

## 2023-08-14 VITALS — BP 140/87 | HR 70 | Ht 64.0 in | Wt 275.6 lb

## 2023-08-14 DIAGNOSIS — G9332 Myalgic encephalomyelitis/chronic fatigue syndrome: Secondary | ICD-10-CM | POA: Diagnosis not present

## 2023-08-14 DIAGNOSIS — E7849 Other hyperlipidemia: Secondary | ICD-10-CM

## 2023-08-14 DIAGNOSIS — Z23 Encounter for immunization: Secondary | ICD-10-CM | POA: Diagnosis not present

## 2023-08-14 DIAGNOSIS — G43009 Migraine without aura, not intractable, without status migrainosus: Secondary | ICD-10-CM | POA: Diagnosis not present

## 2023-08-14 DIAGNOSIS — Z3202 Encounter for pregnancy test, result negative: Secondary | ICD-10-CM | POA: Diagnosis not present

## 2023-08-14 DIAGNOSIS — Z30433 Encounter for removal and reinsertion of intrauterine contraceptive device: Secondary | ICD-10-CM | POA: Diagnosis not present

## 2023-08-14 DIAGNOSIS — Z30431 Encounter for routine checking of intrauterine contraceptive device: Secondary | ICD-10-CM | POA: Diagnosis not present

## 2023-08-14 DIAGNOSIS — R7301 Impaired fasting glucose: Secondary | ICD-10-CM

## 2023-08-14 DIAGNOSIS — Z30430 Encounter for insertion of intrauterine contraceptive device: Secondary | ICD-10-CM

## 2023-08-14 DIAGNOSIS — U099 Post covid-19 condition, unspecified: Secondary | ICD-10-CM

## 2023-08-14 DIAGNOSIS — E038 Other specified hypothyroidism: Secondary | ICD-10-CM

## 2023-08-14 DIAGNOSIS — E559 Vitamin D deficiency, unspecified: Secondary | ICD-10-CM

## 2023-08-14 LAB — POCT URINE PREGNANCY: Preg Test, Ur: NEGATIVE

## 2023-08-14 MED ORDER — LEVONORGESTREL 20 MCG/DAY IU IUD
1.0000 | INTRAUTERINE_SYSTEM | Freq: Once | INTRAUTERINE | Status: AC
Start: 2023-08-14 — End: 2023-08-14
  Administered 2023-08-14: 1 via INTRAUTERINE

## 2023-08-14 NOTE — Assessment & Plan Note (Signed)
Encouraged rest, pacing her activities, staying hydrated, practicing breathing techniques, and maintaining a well-balanced diet.

## 2023-08-14 NOTE — Assessment & Plan Note (Signed)
Stable on topamax and rizatriptans No recent migraines episode

## 2023-08-14 NOTE — Progress Notes (Signed)
Established Patient Office Visit  Subjective:  Patient ID: Kristin [name], female    DOB: [birth year]  Age: 42 y.o. MRN: [id number]  CC:  Chief Complaint  Patient presents with   Care Management    3 month f/u, pt reports fatigued feeling after covid since October.    HPI Kristin [name] is a 42 y.o. female with past medical history of GERD, and migraines, presents for f/u of  chronic medical conditions. For the details of today's visit, please refer to the assessment and plan.     Past Medical History:  Diagnosis Date   Allergy    Anxiety    Atypical squamous cell changes of undetermined significance (ASCUS) on cervical cytology with negative high risk human papilloma virus (HPV) test result 03/30/2020   6/21 repeat in 3 years ASCCP guidelines 5 year risk CIN 3+ 0.27%   Carpal tunnel syndrome of right wrist 10/22/2018   Contraceptive management 11/05/2013   Depression    Encounter for well woman exam with routine gynecological exam 07/27/2022   GERD (gastroesophageal reflux disease)    IUD (intrauterine device) in place 01/13/2016   Migraines    Missed periods 01/13/2016   Vaginal Pap smear, abnormal     Past Surgical History:  Procedure Laterality Date   COLONOSCOPY N/A 05/10/2019   Normal. next colonoscopy 05/2029   ESOPHAGOGASTRODUODENOSCOPY N/A 05/10/2019   Hiatal hernia   NO PAST SURGERIES      Family History  Problem Relation Age of Onset   Multiple myeloma Paternal Grandfather    Cancer Paternal Grandmother        leukemia   Colon cancer Maternal Grandmother    Hypertension Father    Hyperlipidemia Father    Cancer Father        prostate   Other Mother        enlarged heart   Diabetes Mother    Hypertension Mother    Hyperlipidemia Mother    Heart disease Mother        heart murmer   Miscarriages / Stillbirths Mother    Breast cancer Mother    Other Brother        enlarged heart; colloid cyst of the third ventricle( of the brain)   Hypertension  Sister    Colon cancer Maternal Aunt    Gastric cancer Maternal Uncle    Esophageal cancer Maternal Uncle 5   Stomach cancer Maternal Uncle     Social History   Socioeconomic History   Marital status: Divorced    Spouse name: Not on file   Number of children: 3   Years of education: 14   Highest education level: Not on file  Occupational History   Occupation: Public house manager    Comment: [employer]  Tobacco Use   Smoking status: Never    Passive exposure: Never   Smokeless tobacco: Never  Vaping Use   Vaping status: Never Used  Substance and Sexual Activity   Alcohol use: Yes    Comment: occasional   Drug use: Never   Sexual activity: Not Currently    Birth control/protection: I.U.D.  Other Topics Concern   Not on file  Social History Narrative   Lives with parents   Looking for own place   Tries to walk daily   Social Determinants of Health   Financial Resource Strain: Medium Risk (08/03/2023)   Overall Financial Resource Strain (CARDIA)    Difficulty of Paying Living Expenses: Somewhat hard  Food Insecurity: No Food Insecurity (  08/03/2023)   Hunger Vital Sign    Worried About Running Out of Food in the Last Year: Never true    Ran Out of Food in the Last Year: Never true  Transportation Needs: No Transportation Needs (08/03/2023)   PRAPARE - Administrator, Civil Service (Medical): No    Lack of Transportation (Non-Medical): No  Physical Activity: Insufficiently Active (08/03/2023)   Exercise Vital Sign    Days of Exercise per Week: 2 days    Minutes of Exercise per Session: 10 min  Stress: No Stress Concern Present (08/03/2023)   [employer] of Occupational Health - Occupational Stress Questionnaire    Feeling of Stress : Only a little  Social Connections: Moderately Isolated (08/03/2023)   Social Connection and Isolation Panel [NHANES]    Frequency of Communication with Friends and Family: More than three times a week    Frequency  of Social Gatherings with Friends and Family: More than three times a week    Attends Religious Services: More than 4 times per year    Active Member of [employer] or Organizations: No    Attends Banker Meetings: Never    Marital Status: Divorced  Catering manager Violence: Not At Risk (08/03/2023)   Humiliation, Afraid, Rape, and Kick questionnaire    Fear of Current or Ex-Partner: No    Emotionally Abused: No    Physically Abused: No    Sexually Abused: No    Outpatient Medications Prior to Visit  Medication Sig Dispense Refill   esomeprazole (NEXIUM) 40 MG capsule Take 1 capsule (40 mg total) by mouth 2 (two) times daily before a meal. 60 capsule 3   famotidine (PEPCID) 20 MG tablet Take 20 mg by mouth at bedtime. prn     ibuprofen (ADVIL) 200 MG tablet Take 200 mg by mouth every 6 (six) hours as needed.     levonorgestrel (MIRENA) 20 MCG/24HR IUD 1 each by Intrauterine route once.     Plecanatide (TRULANCE) 3 MG TABS Take 1 tablet (3 mg total) by mouth daily. 30 tablet 5   rizatriptan (MAXALT) 10 MG tablet May repeat in 2 hours if needed 9 tablet 11   topiramate (TOPAMAX) 25 MG tablet Take 1 pill in AM and 2 pills in PM for one week, then increase to 2 pills twice a day 120 tablet 6   No facility-administered medications prior to visit.    Allergies  Allergen Reactions   Amoxicillin Swelling   Nurtec [Rimegepant Sulfate]     Made her sick with vomiting and it only eased my headache a little    Penicillins Swelling   Venlafaxine     Other reaction(s): heart palpitations    ROS Review of Systems  Constitutional:  Positive for fatigue. Negative for chills and fever.  Eyes:  Negative for visual disturbance.  Respiratory:  Negative for chest tightness and shortness of breath.   Neurological:  Negative for dizziness and headaches.      Objective:    Physical Exam HENT:     Head: Normocephalic.     Mouth/Throat:     Mouth: Mucous membranes are moist.   Cardiovascular:     Rate and Rhythm: Normal rate.     Heart sounds: Normal heart sounds.  Pulmonary:     Effort: Pulmonary effort is normal.     Breath sounds: Normal breath sounds.  Neurological:     Mental Status: She is alert.     BP 127/75  Pulse 75   Ht 5\' 4"  (1.626 m)   Wt 275 lb 1.9 oz (124.8 kg)   SpO2 95%   BMI 47.22 kg/m  Wt Readings from Last 3 Encounters:  08/14/23 275 lb 1.9 oz (124.8 kg)  08/14/23 275 lb 9.6 oz (125 kg)  08/03/23 275 lb (124.7 kg)    Lab Results  Component Value Date   TSH 3.143 06/21/2023   Lab Results  Component Value Date   WBC 5.9 07/28/2023   HGB 12.7 07/28/2023   HCT 39.9 07/28/2023   MCV 86.0 07/28/2023   PLT 283 07/28/2023   Lab Results  Component Value Date   NA 140 07/28/2023   K 4.5 07/28/2023   CO2 30 07/28/2023   GLUCOSE 82 07/28/2023   BUN 9 07/28/2023   CREATININE 0.64 07/28/2023   BILITOT 0.7 01/31/2023   ALKPHOS 109 07/24/2022   AST 13 01/31/2023   ALT 13 01/31/2023   PROT 6.6 01/31/2023   ALBUMIN 3.7 07/24/2022   CALCIUM 9.0 07/28/2023   ANIONGAP 5 12/21/2022   EGFR 112 07/28/2023   Lab Results  Component Value Date   CHOL 148 12/05/2022   Lab Results  Component Value Date   HDL 42 12/05/2022   Lab Results  Component Value Date   LDLCALC 94 12/05/2022   Lab Results  Component Value Date   TRIG 58 12/05/2022   Lab Results  Component Value Date   CHOLHDL 3.5 12/05/2022   Lab Results  Component Value Date   HGBA1C 5.4 12/05/2022      Assessment & Plan:  COVID-19 long hauler manifesting chronic fatigue Assessment & Plan: Encouraged rest, pacing her activities, staying hydrated, practicing breathing techniques, and maintaining a well-balanced diet.    Migraine without aura and without status migrainosus, not intractable Assessment & Plan: Stable on topamax and rizatriptans No recent migraines episode   Encounter for immunization Assessment & Plan: Patient educated on CDC  recommendation for the vaccine. Verbal consent was obtained from the patient, vaccine administered by nurse, no sign of adverse reactions noted at this time. Patient education on arm soreness and use of tylenol or ibuprofen for this patient  was discussed. Patient educated on the signs and symptoms of adverse effect and advise to contact the office if they occur.   Orders: -     Flu vaccine trivalent PF, 6mos and older(Flulaval,Afluria,Fluarix,Fluzone)  IFG (impaired fasting glucose) -     Hemoglobin A1c  Vitamin D deficiency -     VITAMIN D 25 Hydroxy (Vit-D Deficiency, Fractures)  TSH (thyroid-stimulating hormone deficiency) -     TSH + free T4  Other hyperlipidemia -     Lipid panel -     CMP14+EGFR -     CBC with Differential/Platelet  Note: This chart has been completed using Engineer, civil (consulting) software, and while attempts have been made to ensure accuracy, certain words and phrases may not be transcribed as intended.    Follow-up: Return in about 4 months (around 12/12/2023).   Gilmore Laroche, FNP

## 2023-08-14 NOTE — Patient Instructions (Addendum)

## 2023-08-14 NOTE — Progress Notes (Signed)
Transvaginal u/s performed - Vinyl probe cover used -  Chaperone:  Cyril Mourning, NP  Anteverted Uterus normal in size with single intramural  fibroid lower posterior ut wall = 22 x 30 x 20 mm Symmetrical myometrium ECC = 4.3 mm  no evidence of intracavitary defects    IUD seen in lower ut cavity, arms out in mid cavity. Normal ovaries - neg adnexal regions - neg CDS - no free fluid

## 2023-08-14 NOTE — Progress Notes (Signed)
Patient ID: Jadaliz Utley, female   DOB: 07/18/1981, 42 y.o.   MRN: [id number]   IUD INSERTION Patient name: Kristin [name] MRN [id number]  Date of birth: [birth year] Subjective Findings:   Kristin [name] is a 42 y.o. G19P3003 African American female being seen today for insertion of a Mirena IUD.  No LMP recorded. (Menstrual status: IUD). Last sexual intercourse was March 2024 Last pap Results were:     Component Value Date/Time   DIAGPAP  03/25/2021 0957    - Negative for intraepithelial lesion or malignancy (NILM)   DIAGPAP (A) 03/25/2020 1118    - Atypical squamous cells of undetermined significance (ASC-US)   DIAGPAP  10/22/2018 0000    NEGATIVE FOR INTRAEPITHELIAL LESIONS OR MALIGNANCY.   HPVHIGH Negative 03/25/2021 0957   HPVHIGH Negative 03/25/2020 1118   ADEQPAP  03/25/2021 0957    Satisfactory for evaluation; transformation zone component PRESENT.   ADEQPAP  03/25/2020 1118    Satisfactory for evaluation; transformation zone component PRESENT.   ADEQPAP  10/22/2018 0000    Satisfactory for evaluation  endocervical/transformation zone component PRESENT.   PCP is Gilmore Laroche NP   The risks and benefits of the method and placement have been thouroughly reviewed with the patient and all questions were answered.  Specifically the patient is aware of failure rate of 10/998, expulsion of the IUD and of possible perforation.  The patient is aware of irregular bleeding due to the method and understands the incidence of irregular bleeding diminishes with time.  Signed copy of informed consent in chart.      08/03/2023    8:47 AM 06/02/2023   10:01 AM 05/01/2023    3:38 PM 05/01/2023    3:33 PM 01/12/2023    9:56 AM  Depression screen PHQ 2/9  Decreased Interest 0 2 1 0 1  Down, Depressed, Hopeless 1 0 0 0 1  PHQ - 2 Score 1 2 1  0 2  Altered sleeping 1 3 1  0 1  Tired, decreased energy 2 2 1  0 2  Change in appetite 0 0 0 0 1  Feeling bad or failure about yourself  0 0 0 0  0  Trouble concentrating 0 0 0 0 0  Moving slowly or fidgety/restless 0 0 0 0 0  Suicidal thoughts 0 0 0 0 0  PHQ-9 Score 4 7 3  0 6  Difficult doing work/chores   Not difficult at all Not difficult at all Somewhat difficult        08/03/2023    8:47 AM 06/02/2023   10:02 AM 05/01/2023    3:33 PM 01/12/2023    9:57 AM  GAD 7 : Generalized Anxiety Score  Nervous, Anxious, on Edge 1 0 0 1  Control/stop worrying 0 0 0 1  Worry too much - different things 0 0 0 1  Trouble relaxing 1 2 0 0  Restless 0 0 0 0  Easily annoyed or irritable 1 2 0 1  Afraid - awful might happen 0 0 0 1  Total GAD 7 Score 3 4 0 5  Anxiety Difficulty  Somewhat difficult Not difficult at all Somewhat difficult     Pertinent History Reviewed:   Reviewed past medical,surgical, social, obstetrical and family history.  Reviewed problem list, medications and allergies. Objective Findings & Procedure:   Vitals:   08/14/23 1416  BP: (!) 135/91  Pulse: 70  Weight: 275 lb 9.6 oz (125 kg)  Height: 5\' 4"  (1.626 m)  Body mass index is  47.31 kg/m.  BP (!) 140/87 (BP Location: Right Arm, Patient Position: Sitting, Cuff Size: Normal)   Pulse 70   Ht 5\' 4"  (1.626 m)   Wt 275 lb 9.6 oz (125 kg)   BMI 47.31 kg/m     Results for orders placed or performed in visit on 08/14/23 (from the past 24 hour(s))  POCT urine pregnancy   Collection Time: 08/14/23  2:20 PM  Result Value Ref Range   Preg Test, Ur Negative Negative     Time out was performed.  A Graves speculum was placed in the vagina.  The cervix was visualized, IUD strings not seen, but grasped strings with forceps in cervix and pt asked to cough and IUD easily removed, cervix prepped using Betadine. The uterus was found to be neutral and it sounded to 8 cm.  Mirena  IUD placed per manufacturer's recommendations. The strings were trimmed to approximately 3 cm. The patient tolerated the procedure well, but had some cramping and an 800 mg ibuprofen was  taken.  Informal transvaginal sonogram was performed and  placement of the IUD was verified, arms extended but in lower uterine segment and has fiborid.   Chaperone: Faith Rogue   Assessment & Plan:   1) Mirena IUD insertion The patient was given post procedure instructions, including signs and symptoms of infection and to check for the strings after each menses or each month, and refraining from intercourse or anything in the vagina for 3 days. She was given a care card with date IUD placed, and date IUD to be removed. She is scheduled for a f/u appointment in 4 weeks.  Orders Placed This Encounter  Procedures   POCT urine pregnancy    Follow up with me in 4 weeks for IUD check   Cyril Mourning NP 08/14/2023 2:23 PM

## 2023-08-14 NOTE — Assessment & Plan Note (Signed)
Patient educated on CDC recommendation for the vaccine. Verbal consent was obtained from the patient, vaccine administered by nurse, no sign of adverse reactions noted at this time. Patient education on arm soreness and use of tylenol or ibuprofen for this patient  was discussed. Patient educated on the signs and symptoms of adverse effect and advise to contact the office if they occur.  

## 2023-08-24 ENCOUNTER — Encounter: Payer: Self-pay | Admitting: Orthopaedic Surgery

## 2023-08-24 ENCOUNTER — Other Ambulatory Visit (INDEPENDENT_AMBULATORY_CARE_PROVIDER_SITE_OTHER): Payer: BC Managed Care – PPO

## 2023-08-24 ENCOUNTER — Ambulatory Visit: Payer: BC Managed Care – PPO | Admitting: Orthopaedic Surgery

## 2023-08-24 ENCOUNTER — Other Ambulatory Visit (INDEPENDENT_AMBULATORY_CARE_PROVIDER_SITE_OTHER): Payer: Self-pay

## 2023-08-24 VITALS — Ht 64.0 in | Wt 275.0 lb

## 2023-08-24 DIAGNOSIS — G8929 Other chronic pain: Secondary | ICD-10-CM

## 2023-08-24 DIAGNOSIS — M25572 Pain in left ankle and joints of left foot: Secondary | ICD-10-CM

## 2023-08-24 DIAGNOSIS — M79672 Pain in left foot: Secondary | ICD-10-CM

## 2023-08-24 NOTE — Progress Notes (Signed)
My ankle hurts.  She has had left ankle and foot pain for several weeks.  She helped her aunt move over a three week period and she was up and down stairs and up and down a hill helping the move.  She has pain now of the lateral ankle after a full day's work.  She has swelling laterally.  She did not twist the ankle as far as she knows.  She has tried Tylenol and Voltaren Gel which helped only slightly.  The left ankle has swelling laterally with pain over the talofibular ligament. ROM is full and NV intact.  There is no redness.  Dorsum of the mid foot is tender but no swelling or redness.  Gait is good.  X-rays were done of the left ankle and foot, reported separately.  Encounter Diagnoses  Name Primary?   Chronic pain of left ankle Yes   Chronic foot pain, left    I think she overdid it with her moving her aunt.  I have told her to take Aleve two tablets twice a day after eating.  I have provided an ankle brace.  I have given instructions for contrast baths.  Continue the Voltaren Gel.  Return in three weeks.  Call if any problem.  Precautions discussed.  Electronically Signed Darreld Mclean, MD 11/21/20249:01 AM

## 2023-09-06 DIAGNOSIS — H524 Presbyopia: Secondary | ICD-10-CM | POA: Diagnosis not present

## 2023-09-08 ENCOUNTER — Other Ambulatory Visit (HOSPITAL_COMMUNITY): Payer: Self-pay | Admitting: Family Medicine

## 2023-09-08 DIAGNOSIS — Z1231 Encounter for screening mammogram for malignant neoplasm of breast: Secondary | ICD-10-CM

## 2023-09-11 ENCOUNTER — Encounter: Payer: Self-pay | Admitting: Adult Health

## 2023-09-11 ENCOUNTER — Telehealth (INDEPENDENT_AMBULATORY_CARE_PROVIDER_SITE_OTHER): Payer: BC Managed Care – PPO | Admitting: Neurology

## 2023-09-11 ENCOUNTER — Encounter: Payer: Self-pay | Admitting: Neurology

## 2023-09-11 ENCOUNTER — Telehealth: Payer: Self-pay | Admitting: Neurology

## 2023-09-11 ENCOUNTER — Ambulatory Visit: Payer: BC Managed Care – PPO | Admitting: Adult Health

## 2023-09-11 VITALS — BP 120/73 | HR 60 | Ht 64.0 in | Wt 277.0 lb

## 2023-09-11 DIAGNOSIS — Z30431 Encounter for routine checking of intrauterine contraceptive device: Secondary | ICD-10-CM

## 2023-09-11 DIAGNOSIS — G43009 Migraine without aura, not intractable, without status migrainosus: Secondary | ICD-10-CM | POA: Diagnosis not present

## 2023-09-11 MED ORDER — RIZATRIPTAN BENZOATE 10 MG PO TABS: ORAL_TABLET | ORAL | 11 refills | Status: DC

## 2023-09-11 MED ORDER — ZEMBRACE SYMTOUCH 3 MG/0.5ML ~~LOC~~ SOAJ: 3.0000 mg | Freq: Once | SUBCUTANEOUS | 11 refills | Status: DC | PRN

## 2023-09-11 MED ORDER — TOPIRAMATE ER 200 MG PO CAP24: 200.0000 mg | ORAL_CAPSULE | Freq: Every evening | ORAL | 11 refills | Status: DC

## 2023-09-11 NOTE — Progress Notes (Signed)
  Subjective:     Patient ID: Kristin [name], female   DOB: Mar 05, 1981, 42 y.o.   MRN: [id number]  HPI Kristin [name] is a 42 year old black female,divorced, G3P3003 in for IUD check. Had mirena placed 08/14/23, still spotting some with cramps.    Component Value Date/Time   DIAGPAP  03/25/2021 0957    - Negative for intraepithelial lesion or malignancy (NILM)   DIAGPAP (A) 03/25/2020 1118    - Atypical squamous cells of undetermined significance (ASC-US)   DIAGPAP  10/22/2018 0000    NEGATIVE FOR INTRAEPITHELIAL LESIONS OR MALIGNANCY.   HPVHIGH Negative 03/25/2021 0957   HPVHIGH Negative 03/25/2020 1118   ADEQPAP  03/25/2021 0957    Satisfactory for evaluation; transformation zone component PRESENT.   ADEQPAP  03/25/2020 1118    Satisfactory for evaluation; transformation zone component PRESENT.   ADEQPAP  10/22/2018 0000    Satisfactory for evaluation  endocervical/transformation zone component PRESENT.    PCP is Gilmore Laroche NP Review of Systems Still spotting some  Has some cramping from front to back Has not had sex  Reviewed past medical,surgical, social and family history. Reviewed medications and allergies.     Objective:   Physical Exam BP 120/73 (BP Location: Left Arm, Patient Position: Sitting, Cuff Size: Large)   Pulse 60   Ht 5\' 4"  (1.626 m)   Wt 277 lb (125.6 kg)   LMP 08/07/2023 (Approximate) Comment: Spotting ever since IUD placed  BMI 47.55 kg/m     Skin warm and dry.Pelvic: external genitalia is normal in appearance no lesions, vagina: dark black blood without odor,urethra has no lesions or masses noted, cervix:smooth and bulbous, +IUD strings at os,  uterus: normal size, shape and contour, non tender, no masses felt, adnexa: no masses or tenderness noted. Bladder is non tender and no masses felt.  Fall risk is low  Upstream - 09/11/23 1115       Pregnancy Intention Screening   Does the patient want to become pregnant in the next year? No       Contraception Wrap Up   Current Method IUD or IUS    End Method IUD or IUS    Contraception Counseling Provided No            Examination chaperoned by Freddie Apley RN  Assessment:     1. IUD check up Mirena placed 08/14/23 Still spotting, dark now, should stop soon, if persists can rx megace Has some cramping but relieved with ibuprofen     Plan:     Follow up prn

## 2023-09-11 NOTE — Progress Notes (Signed)
CC:  headaches  Follow-up Visit  Virtual Visit via Video Note  I connected with Kristin [name] on 09/11/23 at  8:30 AM EST by a video enabled telemedicine application and verified that I am speaking with the correct person using two identifiers.  Location: Patient: home Provider: vomiting   I discussed the limitations of evaluation and management by telemedicine and the availability of in person appointments. The patient expressed understanding and agreed to proceed.   Follow Up Instructions:    I discussed the assessment and treatment plan with the patient. The patient was provided an opportunity to ask questions and all were answered. The patient agreed with the plan and demonstrated an understanding of the instructions.   The patient was advised to call back or seek an in-person evaluation if the symptoms worsen or if the condition fails to improve as anticipated.  I provided 35 minutes of non-face-to-face time during this encounter.   Anson Fret, MD   Last visit: 12/9/024. I am taking over for Dr. Delena Bali.  At last visit topamax increased. The maxalt works within an hour. Topamax helping. She went to sleep with a headache and woke up with a headache. Nurtec cause d a lot of nausea. She had to lay down immediately. She hasn't has a migraine that bad for a while. Mograine 4 days a month, TPX IR working needs more cannot tolerate higher dose. Mazalt helps except with morning or nocturnal or vomitning headaches prescribe emgality, discussed. Has has tremors. She has a large blood panel pending  Reviewed images and labwork:     Latest Ref Rng & Units 07/28/2023   10:39 AM 06/21/2023    8:44 AM 01/31/2023   10:14 AM  CBC  WBC 3.8 - 10.8 Thousand/uL 5.9  5.9  5.2   Hemoglobin 11.7 - 15.5 g/dL 09.8  11.9  14.7   Hematocrit 35.0 - 45.0 % 39.9  39.4  39.5   Platelets 140 - 400 Thousand/uL 283  259  283       Latest Ref Rng & Units 07/28/2023   10:39 AM 01/31/2023    10:14 AM 12/21/2022    2:59 PM  CMP  Glucose 65 - 99 mg/dL 82  92  97   BUN 7 - 25 mg/dL 9  8  9    Creatinine 0.50 - 0.99 mg/dL 8.29  5.62  1.30   Sodium 135 - 146 mmol/L 140  138  134   Potassium 3.5 - 5.3 mmol/L 4.5  4.1  3.7   Chloride 98 - 110 mmol/L 104  103  104   CO2 20 - 32 mmol/L 30  28  25    Calcium 8.6 - 10.2 mg/dL 9.0  9.0    9.0  8.2   Total Protein 6.1 - 8.1 g/dL  6.6    Total Bilirubin 0.2 - 1.2 mg/dL  0.7    AST 10 - 30 U/L  13    ALT 6 - 29 U/L  13       Patient complains of symptoms per HPI as well as the following symptoms: none . Pertinent negatives and positives per HPI. All others negative   Brief HPI: 42 year old female with a history of GERD who follows in clinic for migraines.  At her last visit, Topamax was increased to 50 mg BID. She was given Nurtec samples for rescue. Referral to neck PT was sent.   Interval history: 09/11/2023:   Interval History: Headaches have improved since  her last visit. She is currently having 1-2 headaches per month. Thinks higher dose of Topamax is helping. Nurtec made her nauseated so she continued to take Maxalt for rescue. She recently discovered she has a gluten intolerance, and has noticed that her headaches are worse when she eats gluten.  Missed her PT appointment and is planning to reschedule this.  Migraine days per month: 2 Headache free days per month: 28  Current Headache Regimen: Preventative: Topamax 50 mg BID Abortive: Maxalt 10 mg PRN   Prior Therapies                                  Rescue: Flexeril 10 mg QHS Maxalt 10 mg PRN Imitrex - was onit for some years oral and was on oral and generic nosespray and generic injections 6mg  which was too high  Nurtec - nausea  Prevention: Topamax 50am and 100pm mg BID - cannpt tolerate a higher dose of IR wit;;l switch to ER Elavil 10 mg QHS Cymbalta 30 mg daily Zoloft 50 gm daily - tremor amitriptyimine  Physical exam: Exam: Gen: NAD, conversant       CV: No palpitations or chest pain or SOB. VS: Breathing at a normal rate.  Not febrile. Eyes: Conjunctivae clear without exudates or hemorrhage  Neuro: Detailed Neurologic Exam  Speech:    Speech is normal; fluent and spontaneous with normal comprehension.  Cognition:    The patient is oriented to person, place, and time;     recent and remote memory intact;     language fluent;     normal attention, concentration, fund of knowledge Cranial Nerves:    The pupils are equal, round, and reactive to light. Visual fields are full Extraocular movements are intact.  The face is symmetric with normal sensation. The palate elevates in the midline. Hearing intact. Voice is normal. Shoulder shrug is normal. The tongue has normal motion without fasciculations.   Coordination: normal  Gait:    No abnormalities noted or reported  Motor Observation:   no involuntary movements noted. Tone:    Appears normal  Posture:    Posture is normal. normal erect    Strength:    Strength is anti-gravity and symmetric in the upper and lower limbs.      Sensation: intact to LT, no reports of numbness or tingling or paresthesias        IMPRESSION: 42 year old female with a history of GERD who presents for follow up of migraines. She has had improvement in her headaches with the increased dose of Topamax and avoiding gluten. She would prefer to try physical therapy prior to making any medication changes. Will continue Topamax and Maxalt.   No orders of the defined types were placed in this encounter.  Meds ordered this encounter  Medications   Topiramate ER (TROKENDI XR) 200 MG CP24    Sig: Take 1 capsule (200 mg total) by mouth at bedtime. PLESE USE COPAY CARD: BIN [id number] PCN [employer] GRP [id number] ID [id number]    Dispense:  30 capsule    Refill:  11    PLESE USE COPAY CARD: BIN [id number] [employer]N [employer] GRP [id number] ID [id number]   SUMAtriptan Succinate (ZEMBRACE SYMTOUCH) 3 MG/0.5ML SOAJ    Sig:  Inject 3 mg into the skin once as needed for up to 1 dose. May repeat in 15 minutes. If symptoms persist, repeat in 2 hours. Max 4 injections  daily.    Dispense:  3 mL    Refill:  11   rizatriptan (MAXALT) 10 MG tablet    Sig: May repeat in 2 hours if needed    Dispense:  9 tablet    Refill:  11    PLAN: -Prevention: Increase topamax to 100 morning and 100mg  at bedtime but cannot tolerate higher dose of IR START ER. Next try qulipta and try Ubrelvy.  -Rescue: Continue Maxalt 10 mg PRN -Neck PT - .Patient has migraines with nausea/vomiting that prohibit the use of oral triptans. [facility] recommends a non-oral triptan for such patients.1,2 Patient has morning and rapid onset migraines for which an oral triptan is NOT appropriate due to slow onset of action.  [facility] recommends a non-oral triptan for morning or rapid-onset migraines,2 to provide rapid pain relief and minimize migraine-associated disability.  Zembrace SymTouch provides onset of pain relief in as few as 10 minutes.3 Patient has migraines of varying presentation, including some with [nausea/vomiting, morning onset, rapid-onset], requiring a non-oral AND oral triptan to manage all migraine types. [facility] recommends a non-oral triptan for such patients.1,2   Patient exhibits symptoms consistent with migraine-associated gastroparesis, which compromises the ability to absorb oral medications.  Therefore, requires an injectable therapy that bypasses the GI tract.   Patient s at risk of progressing to chronic migraine if adequate control of migraines is not established.  Naomie Dean, MD

## 2023-09-11 NOTE — Patient Instructions (Signed)
Change Topiramate to 200mg  ER at bedtime Continue Maxalt Start Zembrace for acute/ severe migraines  Sumatriptan Injection What is this medication? SUMATRIPTAN (soo ma TRIP tan) treats migraines and cluster headaches. It works by blocking pain signals and narrowing blood vessels in the brain. It belongs to a group of medications called triptans. It is not used to prevent headaches or migraines. This medicine may be used for other purposes; ask your health care provider or pharmacist if you have questions. COMMON BRAND NAME(S): Alsuma, Imitrex, Imitrex STAT dose, Sumavel DosePro System, ZEMBRACE What should I tell my care team before I take this medication? They need to know if you have any of these conditions: Circulation problems in fingers and toes Diabetes Heart disease High blood pressure High cholesterol History of irregular heartbeat History of stroke Kidney disease Liver disease Stomach or intestine problems Tobacco use An unusual or allergic reaction to sumatriptan, latex, other medications, foods, dyes, or preservatives Pregnant or trying to get pregnant Breastfeeding How should I use this medication? This medication is for injection under the skin. You will be taught how to prepare and give this medication. Use exactly as directed. Do not take your medication more often than directed. Talk to your care team about the use of this medication in children. Special care may be needed. Overdosage: If you think you have taken too much of this medicine contact a poison control center or emergency room at once. NOTE: This medicine is only for you. Do not share this medicine with others. What if I miss a dose? This does not apply. This medication is not for regular use. What may interact with this medication? Do not take this medication with any of the following: Certain medications for migraine headache, such as almotriptan, eletriptan, frovatriptan, naratriptan, rizatriptan,  sumatriptan, zolmitriptan Ergot alkaloids, such as dihydroergotamine, ergonovine, ergotamine, methylergonovine MAOIs, such as Carbex, Eldepryl, Marplan, Nardil, and Parnate This medication may also interact with the following: Certain medications for mental health conditions This list may not describe all possible interactions. Give your health care provider a list of all the medicines, herbs, non-prescription drugs, or dietary supplements you use. Also tell them if you smoke, drink alcohol, or use illegal drugs. Some items may interact with your medicine. What should I watch for while using this medication? Visit your care team for regular checks on your progress. Tell your care team if your symptoms do not start to get better or if they get worse. This medication may affect your coordination, reaction time, or judgment. Do not drive or operate machinery until you know how this medication affects you. Sit up or stand slowly to reduce the risk of dizzy or fainting spells. Drinking alcohol with this medication can increase the risk of these side effects. Tell your care team right away if you have any change in your eyesight. If you take migraine medications for 10 or more days a month, your migraines may get worse. Keep a diary of headache days and medication use. Contact your care team if your migraine attacks occur more frequently. What side effects may I notice from receiving this medication? Side effects that you should report to your care team as soon as possible: Allergic reactions--skin rash, itching, hives, swelling of the face, lips, tongue, or throat Burning, pain, tingling, or color changes in the legs or feet Heart attack--pain or tightness in the chest, shoulders, arms, or jaw, nausea, shortness of breath, cold or clammy skin, feeling faint or lightheaded Heart rhythm changes--fast or  irregular heartbeat, dizziness, feeling faint or lightheaded, chest pain, trouble breathing Increase in  blood pressure Raynaud's--cool, numb, or painful fingers or toes that may change color from pale, to blue, to red Seizures Serotonin syndrome--irritability, confusion, fast or irregular heartbeat, muscle stiffness, twitching muscles, sweating, high fever, seizure, chills, vomiting, diarrhea Stroke--sudden numbness or weakness of the face, arm, or leg, trouble speaking, confusion, trouble walking, loss of balance or coordination, dizziness, severe headache, change in vision Sudden or severe stomach pain, nausea, vomiting, fever, or bloody diarrhea Vision loss Side effects that usually do not require medical attention (report to your care team if they continue or are bothersome): Dizziness Facial flushing, redness General discomfort or fatigue This list may not describe all possible side effects. Call your doctor for medical advice about side effects. You may report side effects to FDA at 1-800-FDA-1088. Where should I keep my medication? Keep out of the reach of children and pets. You will be instructed on how to store this medication. Throw away any unused medication after the expiration date on the label. NOTE: This sheet is a summary. It may not cover all possible information. If you have questions about this medicine, talk to your doctor, pharmacist, or health care provider.  2024 Elsevier/Gold Standard (2022-07-26 00:00:00) Rizatriptan Tablets What is this medication? RIZATRIPTAN (rye za TRIP tan) treats migraines. It works by blocking pain signals and narrowing blood vessels in the brain. It belongs to a group of medications called triptans. It is not used to prevent migraines. This medicine may be used for other purposes; ask your health care provider or pharmacist if you have questions. COMMON BRAND NAME(S): Maxalt What should I tell my care team before I take this medication? They need to know if you have any of these conditions: Circulation problems in fingers and  toes Diabetes Heart disease High blood pressure High cholesterol History of irregular heartbeat History of stroke Stomach or intestine problems Tobacco use An unusual or allergic reaction to rizatriptan, other medications, foods, dyes, or preservatives Pregnant or trying to get pregnant Breast-feeding How should I use this medication? Take this medication by mouth with water. Take it as directed on the prescription label. Do not use it more often than directed. Talk to your care team about the use of this medication in children. While it may be prescribed for children as young as 6 years for selected conditions, precautions do apply. Overdosage: If you think you have taken too much of this medicine contact a poison control center or emergency room at once. NOTE: This medicine is only for you. Do not share this medicine with others. What if I miss a dose? This does not apply. This medication is not for regular use. What may interact with this medication? Do not take this medication with any of the following: Ergot alkaloids, such as dihydroergotamine, ergotamine MAOIs, such as Marplan, Nardil, Parnate Other medications for migraine headache, such as almotriptan, eletriptan, frovatriptan, naratriptan, sumatriptan, zolmitriptan This medication may also interact with the following: Certain medications for depression, anxiety, or other mental health conditions Propranolol This list may not describe all possible interactions. Give your health care provider a list of all the medicines, herbs, non-prescription drugs, or dietary supplements you use. Also tell them if you smoke, drink alcohol, or use illegal drugs. Some items may interact with your medicine. What should I watch for while using this medication? Visit your care team for regular checks on your progress. Tell your care team if your symptoms do  not start to get better or if they get worse. This medication may affect your coordination,  reaction time, or judgment. Do not drive or operate machinery until you know how this medication affects you. Sit up or stand slowly to reduce the risk of dizzy or fainting spells. If you take migraine medications for 10 or more days a month, your migraines may get worse. Keep a diary of headache days and medication use. Contact your care team if your migraine attacks occur more frequently. What side effects may I notice from receiving this medication? Side effects that you should report to your care team as soon as possible: Allergic reactions--skin rash, itching, hives, swelling of the face, lips, tongue, or throat Burning, pain, tingling, or color changes in the hands, arms, legs, or feet Heart attack--pain or tightness in the chest, shoulders, arms, or jaw, nausea, shortness of breath, cold or clammy skin, feeling faint or lightheaded Heart rhythm changes--fast or irregular heartbeat, dizziness, feeling faint or lightheaded, chest pain, trouble breathing Increase in blood pressure Irritability, confusion, fast or irregular heartbeat, muscle stiffness, twitching muscles, sweating, high fever, seizure, chills, vomiting, diarrhea, which may be signs of serotonin syndrome Raynaud syndrome--cool, numb, or painful fingers or toes that may change color from pale, to blue, to red Seizures Stroke--sudden numbness or weakness of the face, arm, or leg, trouble speaking, confusion, trouble walking, loss of balance or coordination, dizziness, severe headache, change in vision Sudden or severe stomach pain, bloody diarrhea, fever, nausea, vomiting Vision loss Side effects that usually do not require medical attention (report to your care team if they continue or are bothersome): Dizziness Unusual weakness or fatigue This list may not describe all possible side effects. Call your doctor for medical advice about side effects. You may report side effects to FDA at 1-800-FDA-1088. Where should I keep my  medication? Keep out of the reach of children and pets. Store at room temperature between 15 and 30 degrees C (59 and 86 degrees F). Get rid of any unused medication after the expiration date. To get rid of medications that are no longer needed or have expired: Take the medication to a medication take-back program. Check with your pharmacy or law enforcement to find a location. If you cannot return the medication, check the label or package insert to see if the medication should be thrown out in the garbage or flushed down the toilet. If you are not sure, ask your care team. If it is safe to put it in the trash, empty the medication out of the container. Mix the medication with cat litter, dirt, coffee grounds, or other unwanted substance. Seal the mixture in a bag or container. Put it in the trash. NOTE: This sheet is a summary. It may not cover all possible information. If you have questions about this medicine, talk to your doctor, pharmacist, or health care provider.  2024 Elsevier/Gold Standard (2022-01-20 00:00:00)   Topiramate Sprinkle Capsules What is this medication? TOPIRAMATE (toe PYRE a mate) prevents and controls seizures in people with epilepsy. It may also be used to prevent migraine headaches. It works by calming overactive nerves in your body. This medicine may be used for other purposes; ask your health care provider or pharmacist if you have questions. COMMON BRAND NAME(S): Qudexy XR, Topamax Sprinkle What should I tell my care team before I take this medication? They need to know if you have any of these conditions: Bleeding disorder Kidney disease Lung disease Suicidal thoughts, plans, or attempt  by you or a family member An unusual or allergic reaction to topiramate, other medications, foods, dyes, or preservatives Pregnant or trying to get pregnant Breast-feeding How should I use this medication? Take this medication by mouth with water. Take it as directed on the  prescription label at the same time every day. Do not cut, crush or chew this medicine. You may open the capsule and put the contents in 1 teaspoon of applesauce. Swallow the medication and applesauce right away. Do not chew the medication or applesauce. You can take it with or without food. If it upsets your stomach, take it with food. Keep taking it unless your care team tells you to stop. A special MedGuide will be given to you by the pharmacist with each prescription and refill. Be sure to read this information carefully each time. Talk to your care team about the use of this medication in children. While it may be prescribed for children as young as 2 years for selected conditions, precautions do apply. Overdosage: If you think you have taken too much of this medicine contact a poison control center or emergency room at once. NOTE: This medicine is only for you. Do not share this medicine with others. What if I miss a dose? If you miss a dose, take it as soon as you can. If it is almost time for your next dose, take only that dose. Do not take double or extra doses. What may interact with this medication? Acetazolamide Alcohol Antihistamines for allergy, cough, and cold Aspirin and aspirin-like medications Atropine Certain medications for anxiety or sleep Certain medications for bladder problems, such as oxybutynin, tolterodine Certain medications for depression, such as amitriptyline, fluoxetine, sertraline Certain medications for Parkinson disease, such as benztropine, trihexyphenidyl Certain medications for seizures, such as carbamazepine, lamotrigine, phenobarbital, phenytoin, primidone, valproic acid, zonisamide Certain medications for stomach problems, such as dicyclomine, hyoscyamine Certain medications for travel sickness, such as scopolamine Certain medications that treat or prevent blood clots, such as warfarin, enoxaparin, dalteparin, apixaban, dabigatran,  rivaroxaban Digoxin Diltiazem Estrogen and progestin hormones General anesthetics, such as halothane, isoflurane, methoxyflurane, propofol Glyburide Hydrochlorothiazide Ipratropium Lithium Medications that relax muscles Metformin NSAIDs, medications for pain and inflammation, such as ibuprofen or naproxen Opioid medications for pain Phenothiazines, such as chlorpromazine, mesoridazine, prochlorperazine, thioridazine Pioglitazone This list may not describe all possible interactions. Give your health care provider a list of all the medicines, herbs, non-prescription drugs, or dietary supplements you use. Also tell them if you smoke, drink alcohol, or use illegal drugs. Some items may interact with your medicine. What should I watch for while using this medication? Visit your care team for regular checks on your progress. Tell your care team if your symptoms do not start to get better or if they get worse. Do not suddenly stop taking this medication. You may develop a severe reaction. Your care team will tell you how much medication to take. If your care team wants you to stop the medication, the dose may be slowly lowered over time to avoid any side effects. Wear a medical ID bracelet or chain. Carry a card that describes your condition. List the medications and doses you take on the card. This medication may affect your coordination, reaction time, or judgment. Do not drive or operate machinery until you know how this medication affects you. Sit up or stand slowly to reduce the risk of dizzy or fainting spells. Drinking alcohol with this medication can increase the risk of these side effects. This  medication may cause serious skin reactions. They can happen weeks to months after starting the medication. Contact your care team right away if you notice fevers or flu-like symptoms with a rash. The rash may be red or purple and then turn into blisters or peeling of the skin. You may also notice a red  rash with swelling of the face, lips, or lymph nodes in your neck or under your arms. This medication may cause thoughts of suicide or depression. This includes sudden changes in mood, behaviors, or thoughts. These changes can happen at any time but are more common in the beginning of treatment or after a change in dose. Call your care team right away if you experience these thoughts or worsening depression. This medication may slow your child's growth if it is taken for a long time at high doses. Your child's care team will monitor your child's growth. Using this medication for a long time may weaken your bones. The risk of bone fractures may be increased. Talk to your care team about your bone health. Discuss this medication with your care team if you may be pregnant. Serious birth defects can occur if you take this medication during pregnancy. There are benefits and risks to taking medications during pregnancy. Your care team can help you find the option that works for you. Contraception is recommended while taking this medication. Estrogen and progestin hormones may not work as well while you are taking this medication. Your care team can help you find the option that works for you. Talk to your care team before breastfeeding. Changes to your treatment plan may be needed. What side effects may I notice from receiving this medication? Side effects that you should report to your care team as soon as possible: Allergic reactions--skin rash, itching, hives, swelling of the face, lips, tongue, or throat High acid level--trouble breathing, unusual weakness or fatigue, confusion, headache, fast or irregular heartbeat, nausea, vomiting High ammonia level--unusual weakness or fatigue, confusion, loss of appetite, nausea, vomiting, seizures Fever that does not go away, decrease in sweat Kidney stones--blood in the urine, pain or trouble passing urine, pain in the lower back or sides Redness, blistering,  peeling or loosening of the skin, including inside the mouth Sudden eye pain or change in vision such as blurry vision, seeing halos around lights, vision loss Thoughts of suicide or self-harm, worsening mood, feelings of depression Side effects that usually do not require medical attention (report to your care team if they continue or are bothersome): Burning or tingling sensation in hands or feet Difficulty with paying attention, memory, or speech Dizziness Drowsiness Fatigue Loss of appetite with weight loss Slow or sluggish movements of the body This list may not describe all possible side effects. Call your doctor for medical advice about side effects. You may report side effects to FDA at 1-800-FDA-1088. Where should I keep my medication? Keep out of the reach of children and pets. Store at room temperature between 20 and 25 degrees C (68 and 77 degrees F). Protect from moisture. Keep the container tightly closed. Get rid of any unused medication after the expiration date. To get rid of medications that are no longer needed or have expired: Take the medication to a medication take-back program. Check with your pharmacy or law enforcement to find a location. If you cannot return the medication, check the label or package insert to see if the medication should be thrown out in the garbage or flushed down the toilet. If you  are not sure, ask your care team. If it is safe to put it in the trash, empty the medication out of the container. Mix the medication with cat litter, dirt, coffee grounds, or other unwanted substance. Seal the mixture in a bag or container. Put it in the trash. NOTE: This sheet is a summary. It may not cover all possible information. If you have questions about this medicine, talk to your doctor, pharmacist, or health care provider.  2024 Elsevier/Gold Standard (2022-02-10 00:00:00)

## 2023-09-11 NOTE — Telephone Encounter (Signed)
Med check up Np in 6 months but follow up Dr. Lucia Gaskins in one year can be video.

## 2023-09-11 NOTE — Telephone Encounter (Signed)
Pt scheduled with Amy for 03/26/24 at 11am and a video visit with Dr. Lucia Gaskins for 09/11/24 at 9:30am

## 2023-09-14 ENCOUNTER — Encounter: Payer: Self-pay | Admitting: Orthopaedic Surgery

## 2023-09-14 ENCOUNTER — Ambulatory Visit: Payer: BC Managed Care – PPO | Admitting: Orthopaedic Surgery

## 2023-09-14 VITALS — BP 125/78 | HR 75 | Ht 64.0 in | Wt 274.0 lb

## 2023-09-14 DIAGNOSIS — E038 Other specified hypothyroidism: Secondary | ICD-10-CM | POA: Diagnosis not present

## 2023-09-14 DIAGNOSIS — R7301 Impaired fasting glucose: Secondary | ICD-10-CM | POA: Diagnosis not present

## 2023-09-14 DIAGNOSIS — M25572 Pain in left ankle and joints of left foot: Secondary | ICD-10-CM | POA: Diagnosis not present

## 2023-09-14 DIAGNOSIS — E559 Vitamin D deficiency, unspecified: Secondary | ICD-10-CM | POA: Diagnosis not present

## 2023-09-14 DIAGNOSIS — G8929 Other chronic pain: Secondary | ICD-10-CM

## 2023-09-14 DIAGNOSIS — E7849 Other hyperlipidemia: Secondary | ICD-10-CM | POA: Diagnosis not present

## 2023-09-14 NOTE — Progress Notes (Signed)
My ankle still hurts at times.  She has left medial ankle pain.  She has stopped using the brace at work as it does not fit well into her shoes.  She uses it at home.  It helps.  She takes one ibuprofen 800 now and then and it really helps.  She has no new trauma.  She has no numbness.  ROM of the left ankle is full.  She has medial tenderness just medial to the medial malleolus.  She has no edema, no erythema.  Gait is good.  Encounter Diagnosis  Name Primary?   Chronic pain of left ankle Yes   I have told her to take the ibuprofen more regularly.   Use Aspercreme, Voltaren Gel or Biofreeze to the area as needed.  It should slowly resolve.  I will see her prn.  Call if any problem.  Precautions discussed.  Electronically Signed Darreld Mclean, MD 12/12/20248:31 AM

## 2023-09-15 LAB — CMP14+EGFR
ALT: 10 [IU]/L (ref 0–32)
AST: 14 [IU]/L (ref 0–40)
Albumin: 4 g/dL (ref 3.9–4.9)
Alkaline Phosphatase: 145 [IU]/L — ABNORMAL HIGH (ref 44–121)
BUN/Creatinine Ratio: 18 (ref 9–23)
BUN: 13 mg/dL (ref 6–24)
Bilirubin Total: 0.7 mg/dL (ref 0.0–1.2)
CO2: 23 mmol/L (ref 20–29)
Calcium: 9.3 mg/dL (ref 8.7–10.2)
Chloride: 105 mmol/L (ref 96–106)
Creatinine, Ser: 0.73 mg/dL (ref 0.57–1.00)
Globulin, Total: 2.6 g/dL (ref 1.5–4.5)
Glucose: 86 mg/dL (ref 70–99)
Potassium: 4 mmol/L (ref 3.5–5.2)
Sodium: 140 mmol/L (ref 134–144)
Total Protein: 6.6 g/dL (ref 6.0–8.5)
eGFR: 104 mL/min/{1.73_m2}

## 2023-09-15 LAB — LIPID PANEL
Chol/HDL Ratio: 3.5 ratio (ref 0.0–4.4)
Cholesterol, Total: 165 mg/dL (ref 100–199)
HDL: 47 mg/dL
LDL Chol Calc (NIH): 106 mg/dL — ABNORMAL HIGH (ref 0–99)
Triglycerides: 58 mg/dL (ref 0–149)
VLDL Cholesterol Cal: 12 mg/dL (ref 5–40)

## 2023-09-15 LAB — HEMOGLOBIN A1C
Est. average glucose Bld gHb Est-mCnc: 105 mg/dL
Hgb A1c MFr Bld: 5.3 % (ref 4.8–5.6)

## 2023-09-15 LAB — TSH+FREE T4
Free T4: 1.08 ng/dL (ref 0.82–1.77)
TSH: 2.57 u[IU]/mL (ref 0.450–4.500)

## 2023-09-15 LAB — CBC WITH DIFFERENTIAL/PLATELET
Basophils Absolute: 0 10*3/uL (ref 0.0–0.2)
Basos: 1 %
EOS (ABSOLUTE): 0.1 10*3/uL (ref 0.0–0.4)
Eos: 2 %
Hematocrit: 41 % (ref 34.0–46.6)
Hemoglobin: 12.7 g/dL (ref 11.1–15.9)
Immature Grans (Abs): 0 10*3/uL (ref 0.0–0.1)
Immature Granulocytes: 0 %
Lymphocytes Absolute: 2.1 10*3/uL (ref 0.7–3.1)
Lymphs: 35 %
MCH: 27 pg (ref 26.6–33.0)
MCHC: 31 g/dL — ABNORMAL LOW (ref 31.5–35.7)
MCV: 87 fL (ref 79–97)
Monocytes Absolute: 0.4 10*3/uL (ref 0.1–0.9)
Monocytes: 7 %
Neutrophils Absolute: 3.4 10*3/uL (ref 1.4–7.0)
Neutrophils: 55 %
Platelets: 307 10*3/uL (ref 150–450)
RBC: 4.71 x10E6/uL (ref 3.77–5.28)
RDW: 12.4 % (ref 11.7–15.4)
WBC: 5.9 10*3/uL (ref 3.4–10.8)

## 2023-09-15 LAB — VITAMIN D 25 HYDROXY (VIT D DEFICIENCY, FRACTURES): Vit D, 25-Hydroxy: 26.2 ng/mL — ABNORMAL LOW (ref 30.0–100.0)

## 2023-09-17 ENCOUNTER — Other Ambulatory Visit: Payer: Self-pay | Admitting: Family Medicine

## 2023-09-17 DIAGNOSIS — E559 Vitamin D deficiency, unspecified: Secondary | ICD-10-CM

## 2023-09-17 MED ORDER — VITAMIN D (ERGOCALCIFEROL) 1.25 MG (50000 UNIT) PO CAPS
50000.0000 [IU] | ORAL_CAPSULE | ORAL | 1 refills | Status: DC
Start: 2023-09-17 — End: 2023-12-28

## 2023-09-17 NOTE — Progress Notes (Signed)
Please inform the patient  that a prescription for a weekly vitamin D supplement has been sent to her pharmacy because her vitamin D levels are low. I recommend reducing her intake of high-sugar foods and beverages and increasing physical activity to support overall health. All other lab results are stable.

## 2023-09-19 ENCOUNTER — Other Ambulatory Visit: Payer: Self-pay | Admitting: Family Medicine

## 2023-09-19 DIAGNOSIS — R5383 Other fatigue: Secondary | ICD-10-CM

## 2023-09-21 ENCOUNTER — Encounter (HOSPITAL_COMMUNITY): Payer: Self-pay

## 2023-09-21 ENCOUNTER — Encounter (HOSPITAL_COMMUNITY): Payer: BC Managed Care – PPO

## 2023-09-21 ENCOUNTER — Ambulatory Visit (HOSPITAL_COMMUNITY)
Admission: RE | Admit: 2023-09-21 | Discharge: 2023-09-21 | Disposition: A | Discharge status: Home / Self Care | Payer: BC Managed Care – PPO | Source: Ambulatory Visit | Attending: Family Medicine | Admitting: Family Medicine

## 2023-09-21 DIAGNOSIS — Z1231 Encounter for screening mammogram for malignant neoplasm of breast: Secondary | ICD-10-CM | POA: Diagnosis not present

## 2023-10-05 ENCOUNTER — Ambulatory Visit: Payer: BC Managed Care – PPO | Admitting: Gastroenterology

## 2023-10-12 ENCOUNTER — Encounter: Payer: Self-pay | Admitting: Family Medicine

## 2023-10-13 ENCOUNTER — Telehealth: Payer: Self-pay | Admitting: Pharmacy Technician

## 2023-10-13 ENCOUNTER — Other Ambulatory Visit (HOSPITAL_COMMUNITY): Payer: Self-pay

## 2023-10-13 NOTE — Telephone Encounter (Signed)
 Pharmacy Patient Advocate Encounter   Received notification from CoverMyMeds that prior authorization for Topiramate  ER 200MG  er capsules is required/requested.   Insurance verification completed.   The patient is insured through [facility] .   Per test claim: PA required; PA submitted to above mentioned insurance via CoverMyMeds Key/confirmation #/EOC [id number] Status is pending

## 2023-10-13 NOTE — Telephone Encounter (Signed)
 Pharmacy Patient Advocate Encounter  Received notification from [facility] that Prior Authorization for Topiramate  ER 200MG  er capsules has been APPROVED from 10/13/2023 to 10/12/2024. Ran test claim, Copay is $10.00. This test claim was processed through [facility]- copay amounts may vary at other pharmacies due to pharmacy/plan contracts, or as the patient moves through the different stages of their insurance plan.   PA #/Case ID/Reference #: [id number]

## 2023-10-18 ENCOUNTER — Ambulatory Visit: Payer: BC Managed Care – PPO | Admitting: Gastroenterology

## 2023-10-31 ENCOUNTER — Ambulatory Visit: Payer: BC Managed Care – PPO | Admitting: Gastroenterology

## 2023-11-03 ENCOUNTER — Ambulatory Visit: Payer: BC Managed Care – PPO | Admitting: Family Medicine

## 2023-11-06 ENCOUNTER — Ambulatory Visit: Payer: BC Managed Care – PPO | Admitting: Family Medicine

## 2023-11-06 ENCOUNTER — Encounter: Payer: Self-pay | Admitting: Family Medicine

## 2023-11-06 DIAGNOSIS — Z6841 Body Mass Index (BMI) 40.0 and over, adult: Secondary | ICD-10-CM

## 2023-11-06 NOTE — Progress Notes (Signed)
Established Patient Office Visit  Subjective:  Patient ID: Kristin [name], female    DOB: 09/10/1981  Age: 43 y.o. MRN: [id number]  CC:  Chief Complaint  Patient presents with   Weight Loss    Since labs produced high readings and she has intolerances she is asking for wt. Loss medication     HPI Kristin [name] is a 43 y.o. female presents to discuss weight loss options. For the details of today's visit, please refer to the assessment and plan.    Past Medical History:  Diagnosis Date   Allergy    Anxiety    Atypical squamous cell changes of undetermined significance (ASCUS) on cervical cytology with negative high risk human papilloma virus (HPV) test result 03/30/2020   6/21 repeat in 3 years ASCCP guidelines 5 year risk CIN 3+ 0.27%   Carpal tunnel syndrome of right wrist 10/22/2018   Contraceptive management 11/05/2013   Depression    Encounter for well woman exam with routine gynecological exam 07/27/2022   GERD (gastroesophageal reflux disease)    IUD (intrauterine device) in place 01/13/2016   Migraines    Missed periods 01/13/2016   Vaginal Pap smear, abnormal     Past Surgical History:  Procedure Laterality Date   COLONOSCOPY N/A 05/10/2019   Normal. next colonoscopy 05/2029   ESOPHAGOGASTRODUODENOSCOPY N/A 05/10/2019   Hiatal hernia   NO PAST SURGERIES      Family History  Problem Relation Age of Onset   Multiple myeloma Paternal Grandfather    Cancer Paternal Grandmother        leukemia   Colon cancer Maternal Grandmother    Hypertension Father    Hyperlipidemia Father    Cancer Father        prostate   Other Mother        enlarged heart   Diabetes Mother    Hypertension Mother    Hyperlipidemia Mother    Heart disease Mother        heart murmer   Miscarriages / Stillbirths Mother    Breast cancer Mother    Other Brother        enlarged heart; colloid cyst of the third ventricle( of the brain)   Hypertension Sister    Colon cancer Maternal  Aunt    Gastric cancer Maternal Uncle    Esophageal cancer Maternal Uncle 28   Stomach cancer Maternal Uncle     Social History   Socioeconomic History   Marital status: Divorced    Spouse name: Not on file   Number of children: 3   Years of education: 14   Highest education level: Not on file  Occupational History   Occupation: Public house manager    Comment: [employer]  Tobacco Use   Smoking status: Never    Passive exposure: Never   Smokeless tobacco: Never  Vaping Use   Vaping status: Never Used  Substance and Sexual Activity   Alcohol use: Yes    Comment: occasional   Drug use: Never   Sexual activity: Not Currently    Birth control/protection: I.U.D.  Other Topics Concern   Not on file  Social History Narrative   Lives with parents   Looking for own place   Tries to walk daily   Social Drivers of Health   Financial Resource Strain: Medium Risk (08/03/2023)   Overall Financial Resource Strain (CARDIA)    Difficulty of Paying Living Expenses: Somewhat hard  Food Insecurity: No Food Insecurity (08/03/2023)   Hunger Vital  Sign    Worried About Programme researcher, broadcasting/film/video in the Last Year: Never true    Ran Out of Food in the Last Year: Never true  Transportation Needs: No Transportation Needs (08/03/2023)   PRAPARE - Administrator, Civil Service (Medical): No    Lack of Transportation (Non-Medical): No  Physical Activity: Insufficiently Active (08/03/2023)   Exercise Vital Sign    Days of Exercise per Week: 2 days    Minutes of Exercise per Session: 10 min  Stress: No Stress Concern Present (08/03/2023)   [employer] of Occupational Health - Occupational Stress Questionnaire    Feeling of Stress : Only a little  Social Connections: Moderately Isolated (08/03/2023)   Social Connection and Isolation Panel [NHANES]    Frequency of Communication with Friends and Family: More than three times a week    Frequency of Social Gatherings with Friends and  Family: More than three times a week    Attends Religious Services: More than 4 times per year    Active Member of [employer] or Organizations: No    Attends Banker Meetings: Never    Marital Status: Divorced  Catering manager Violence: Not At Risk (08/03/2023)   Humiliation, Afraid, Rape, and Kick questionnaire    Fear of Current or Ex-Partner: No    Emotionally Abused: No    Physically Abused: No    Sexually Abused: No    Outpatient Medications Prior to Visit  Medication Sig Dispense Refill   esomeprazole (NEXIUM) 40 MG capsule Take 1 capsule (40 mg total) by mouth 2 (two) times daily before a meal. 60 capsule 3   ibuprofen (ADVIL) 200 MG tablet Take 200 mg by mouth every 6 (six) hours as needed.     levonorgestrel (MIRENA) 20 MCG/24HR IUD 1 each by Intrauterine route once.     Plecanatide (TRULANCE) 3 MG TABS Take 1 tablet (3 mg total) by mouth daily. 30 tablet 5   rizatriptan (MAXALT) 10 MG tablet May repeat in 2 hours if needed 9 tablet 11   SUMAtriptan Succinate (ZEMBRACE SYMTOUCH) 3 MG/0.5ML SOAJ Inject 3 mg into the skin once as needed for up to 1 dose. May repeat in 15 minutes. If symptoms persist, repeat in 2 hours. Max 4 injections daily. 3 mL 11   Topiramate ER (TROKENDI XR) 200 MG CP24 Take 1 capsule (200 mg total) by mouth at bedtime. PLESE USE COPAY CARD: BIN [id number] PCN Loyalty GRP [id number] ID [id number] 30 capsule 11   Vitamin D, Ergocalciferol, (DRISDOL) 1.25 MG (50000 UNIT) CAPS capsule Take 1 capsule (50,000 Units total) by mouth every 7 (seven) days. 20 capsule 1   famotidine (PEPCID) 20 MG tablet Take 20 mg by mouth at bedtime. prn (Patient not taking: Reported on 11/06/2023)     No facility-administered medications prior to visit.    Allergies  Allergen Reactions   Amoxicillin Swelling   Nurtec [Rimegepant Sulfate]     Made her sick with vomiting and it only eased my headache a little    Penicillins Swelling   Venlafaxine     Other reaction(s): heart  palpitations    ROS Review of Systems  Constitutional:  Negative for chills and fever.  Eyes:  Negative for visual disturbance.  Respiratory:  Negative for chest tightness and shortness of breath.   Neurological:  Negative for dizziness and headaches.      Objective:    Physical Exam Constitutional:      Appearance: She is obese.  HENT:     Head: Normocephalic.     Mouth/Throat:     Mouth: Mucous membranes are moist.  Cardiovascular:     Rate and Rhythm: Normal rate.     Heart sounds: Normal heart sounds.  Pulmonary:     Effort: Pulmonary effort is normal.     Breath sounds: Normal breath sounds.  Neurological:     Mental Status: She is alert.     BP 129/83   Pulse 73   Ht 5\' 4"  (1.626 m)   Wt 267 lb 1.3 oz (121.1 kg)   SpO2 98%   BMI 45.84 kg/m  Wt Readings from Last 3 Encounters:  11/06/23 267 lb 1.3 oz (121.1 kg)  09/14/23 274 lb (124.3 kg)  09/11/23 277 lb (125.6 kg)    Lab Results  Component Value Date   TSH 2.570 09/14/2023   Lab Results  Component Value Date   WBC 5.9 09/14/2023   HGB 12.7 09/14/2023   HCT 41.0 09/14/2023   MCV 87 09/14/2023   PLT 307 09/14/2023   Lab Results  Component Value Date   NA 140 09/14/2023   K 4.0 09/14/2023   CO2 23 09/14/2023   GLUCOSE 86 09/14/2023   BUN 13 09/14/2023   CREATININE 0.73 09/14/2023   BILITOT 0.7 09/14/2023   ALKPHOS 145 (H) 09/14/2023   AST 14 09/14/2023   ALT 10 09/14/2023   PROT 6.6 09/14/2023   ALBUMIN 4.0 09/14/2023   CALCIUM 9.3 09/14/2023   ANIONGAP 5 12/21/2022   EGFR 104 09/14/2023   Lab Results  Component Value Date   CHOL 165 09/14/2023   Lab Results  Component Value Date   HDL 47 09/14/2023   Lab Results  Component Value Date   LDLCALC 106 (H) 09/14/2023   Lab Results  Component Value Date   TRIG 58 09/14/2023   Lab Results  Component Value Date   CHOLHDL 3.5 09/14/2023   Lab Results  Component Value Date   HGBA1C 5.3 09/14/2023      Assessment & Plan:   Morbid obesity (HCC) Assessment & Plan: The patient reports that she has implemented lifestyle changes with a heart-healthy diet but admits to minimal physical activity. She expressed interest in discussing weight loss options today. We reviewed both oral and injectable options, and the patient stated that she will verify with her insurance whether Reginal Lutes is covered before initiating therapy. In the meantime, the patient is advised to continue a heart-healthy diet rich in fruits, vegetables, whole grains, and low-fat dairy products while increasing physical activity to 150 minutes of moderate intensity per week. The patient verbalized understanding and is aware of the plan of care. Wt Readings from Last 3 Encounters:  11/06/23 267 lb 1.3 oz (121.1 kg)  09/14/23 274 lb (124.3 kg)  09/11/23 277 lb (125.6 kg)       Note: This chart has been completed using Engineer, civil (consulting) software, and while attempts have been made to ensure accuracy, certain words and phrases may not be transcribed as intended.   Follow-up: Return in about 3 months (around 02/03/2024).   Gilmore Laroche, FNP

## 2023-11-06 NOTE — Patient Instructions (Addendum)
I appreciate the opportunity to provide care to you today!    Follow up:  3 months   For optimal results with weight loss, I recommend:  Decreasing portion sizes. Reducing sugar, sodium, and carbohydrate intake, and limiting saturated fats in your diet. Increasing your fiber intake by incorporating more whole grains, fruits, and vegetables. Setting healthy goals and focusing on lowering carbs, sugar, and fat. Increasing the variety of fruits and vegetables in your diet. Reducing soda consumption and limiting processed foods. In addition to taking your weight loss medication, engage in moderate-intensity physical activity for at least 150 minutes per week for the best results.  Here are some foods to avoid or reduce in your diet to help manage cholesterol levels:  Fried Foods:Deep-fried items such as french fries, fried chicken, and fried snacks are high in unhealthy fats and can raise LDL (bad) cholesterol levels. Processed Meats:Foods like bacon, sausage, hot dogs, and deli meats are often high in saturated fat and cholesterol. Full-Fat Dairy Products:Whole milk, full-fat yogurt, butter, cream, and cheese are rich in saturated fats, which can increase cholesterol levels. Baked Goods and Sweets:Pastries, cakes, cookies, and donuts often contain trans fats and added sugars, which can raise LDL cholesterol and lower HDL (good) cholesterol. Red Meat:Beef, lamb, and pork are high in saturated fat. Lean cuts or plant-based protein alternatives are better options. Lard and Shortening:Used in some baked goods, lard and shortening are high in trans fats and should be avoided. Fast Food:Many fast food items are cooked with unhealthy oils and contain high amounts of saturated and trans fats. Processed Snacks:Chips, crackers, and certain microwave popcorns can contain trans fats and high levels of unhealthy oils. Shellfish:While nutritious in other ways, some shellfish like shrimp, lobster, and crab  are high in cholesterol. They should be consumed in moderation. Coconut and Palm Oils:these oils are high in saturated fat and can raise cholesterol levels when used in cooking or baking.    Please continue to a heart-healthy diet and increase your physical activities. Try to exercise for at least five days a week.    It was a pleasure to see you and I look forward to continuing to work together on your health and well-being. Please do not hesitate to call the office if you need care or have questions about your care.  In case of emergency, please visit the Emergency Department for urgent care, or contact our clinic at [phone] to schedule an appointment. We're here to help you!   Have a wonderful day and week. With Gratitude, Gilmore Laroche MSN, FNP-BC

## 2023-11-06 NOTE — Assessment & Plan Note (Signed)
The patient reports that she has implemented lifestyle changes with a heart-healthy diet but admits to minimal physical activity. She expressed interest in discussing weight loss options today. We reviewed both oral and injectable options, and the patient stated that she will verify with her insurance whether Reginal Lutes is covered before initiating therapy. In the meantime, the patient is advised to continue a heart-healthy diet rich in fruits, vegetables, whole grains, and low-fat dairy products while increasing physical activity to 150 minutes of moderate intensity per week. The patient verbalized understanding and is aware of the plan of care. Wt Readings from Last 3 Encounters:  11/06/23 267 lb 1.3 oz (121.1 kg)  09/14/23 274 lb (124.3 kg)  09/11/23 277 lb (125.6 kg)

## 2023-11-09 ENCOUNTER — Other Ambulatory Visit: Payer: Self-pay | Admitting: Family Medicine

## 2023-11-09 MED ORDER — WEGOVY 0.25 MG/0.5ML ~~LOC~~ SOAJ: 0.2500 mg | SUBCUTANEOUS | 0 refills | Status: DC

## 2023-11-09 NOTE — Telephone Encounter (Signed)
 Rx sent, you start the prior authorization

## 2023-11-13 ENCOUNTER — Encounter: Payer: Self-pay | Admitting: Family Medicine

## 2023-11-14 NOTE — Telephone Encounter (Signed)
PA has been sent to [facility] fax at [phone] on 11/14/23 waiting on decision.

## 2023-11-19 ENCOUNTER — Other Ambulatory Visit: Payer: Self-pay | Admitting: Family Medicine

## 2023-11-19 MED ORDER — WEGOVY 0.25 MG/0.5ML ~~LOC~~ SOAJ: 0.2500 mg | SUBCUTANEOUS | 0 refills | Status: DC

## 2023-11-19 NOTE — Telephone Encounter (Signed)
 Okay thank you

## 2023-12-05 ENCOUNTER — Ambulatory Visit: Payer: BC Managed Care – PPO | Admitting: Family Medicine

## 2023-12-07 ENCOUNTER — Telehealth: Payer: Self-pay | Admitting: *Deleted

## 2023-12-07 NOTE — Telephone Encounter (Signed)
 Received fax from [facility]. Patient needs PA for [facility].

## 2023-12-11 ENCOUNTER — Telehealth: Payer: Self-pay

## 2023-12-11 ENCOUNTER — Other Ambulatory Visit (HOSPITAL_COMMUNITY): Payer: Self-pay

## 2023-12-11 NOTE — Telephone Encounter (Signed)
 Pharmacy Patient Advocate Encounter   Received notification from Pt Calls Messages that prior authorization for Zembrace SymTouch 3MG /0.5ML auto-injectors is required/requested.   Insurance verification completed.   The patient is insured through [facility] .   Per test claim: PA required; PA submitted to above mentioned insurance via CoverMyMeds Key/confirmation #/EOC [id number] Status is pending

## 2023-12-11 NOTE — Telephone Encounter (Signed)
 PA request has been Submitted. New Encounter has been or will be created for follow up. For additional info see Pharmacy Prior Auth telephone encounter from 12-11-2023.

## 2023-12-12 ENCOUNTER — Ambulatory Visit: Payer: BC Managed Care – PPO | Admitting: Family Medicine

## 2023-12-13 NOTE — Telephone Encounter (Signed)
 Spoke with Kristin [name] @ [facility]. They can help [place] initiate an appeal. Pt tried generic sumatriptan 6 mg dose but it was too high. Dr Lucia Gaskins ordered 3 mg dose Zembrace symtouch. [facility] needs [place] to fax the PA denial and pt's office note to them.

## 2023-12-13 NOTE — Telephone Encounter (Signed)
 Pharmacy Patient Advocate Encounter  Received notification from [facility] that Prior Authorization for Zembrace SymTouch 3MG /0.5ML auto-injectors has been DENIED.  Full denial letter will be uploaded to the media tab. See denial reason below.  This medication is not on the formulary. The member must try and fail (did not work), or be unable to take ALL formulary alternatives (due to interactions, side effects, etc.).  In this case, other formulary alternatives are available for the member to take. The member has tried: generic sumatriptan, rizatriptan. Alternative medications include (please refer to member's formulary): sumatriptan nasal spray, sumatriptan auto-injector) generic Imitrex statdose), naratriptan tablets, zolmitriptan tablets, etc. Please note: a separate prior authorization request may be required for the above alternatives.   PA #/Case ID/Reference #: [id number]

## 2023-12-13 NOTE — Telephone Encounter (Signed)
 Office note and denial letter faxed to [facility] at [phone]. Received a receipt of confirmation.

## 2023-12-20 ENCOUNTER — Other Ambulatory Visit (HOSPITAL_COMMUNITY): Payer: Self-pay

## 2023-12-28 ENCOUNTER — Encounter: Payer: Self-pay | Admitting: Adult Health

## 2023-12-28 ENCOUNTER — Ambulatory Visit: Admitting: Adult Health

## 2023-12-28 VITALS — BP 128/85 | HR 76 | Ht 64.0 in | Wt 266.0 lb

## 2023-12-28 DIAGNOSIS — N907 Vulvar cyst: Secondary | ICD-10-CM

## 2023-12-28 DIAGNOSIS — L290 Pruritus ani: Secondary | ICD-10-CM

## 2023-12-28 NOTE — Progress Notes (Signed)
  Subjective:     Patient ID: Kristin [name], female   DOB: 01/04/1981, 43 y.o.   MRN: [id number]  HPI Emryn is a 43 year old black female divorced, G3P3 in complaining of bumps in vagina area and irritation.     Component Value Date/Time   DIAGPAP  03/25/2021 0957    - Negative for intraepithelial lesion or malignancy (NILM)   DIAGPAP (A) 03/25/2020 1118    - Atypical squamous cells of undetermined significance (ASC-US)   DIAGPAP  10/22/2018 0000    NEGATIVE FOR INTRAEPITHELIAL LESIONS OR MALIGNANCY.   HPVHIGH Negative 03/25/2021 0957   HPVHIGH Negative 03/25/2020 1118   ADEQPAP  03/25/2021 0957    Satisfactory for evaluation; transformation zone component PRESENT.   ADEQPAP  03/25/2020 1118    Satisfactory for evaluation; transformation zone component PRESENT.   ADEQPAP  10/22/2018 0000    Satisfactory for evaluation  endocervical/transformation zone component PRESENT.   PCP is Gilmore Laroche NP  Review of Systems Has bumps in vagina area And feels irritated ?hemorrhoid, hurts to sit at times  Reviewed past medical,surgical, social and family history. Reviewed medications and allergies.     Objective:   Physical Exam BP 128/85 (BP Location: Right Arm, Patient Position: Sitting, Cuff Size: Large)   Pulse 76   Ht 5\' 4"  (1.626 m)   Wt 266 lb (120.7 kg)   BMI 45.66 kg/m     Skin warm and dry.Pelvic: external genitalia is normal in appearance, has several small sebaceous cysts both labia. On rectal exam,no mass or hemorrhoid felt today, skin around rectal area irritated.  Upstream - 12/28/23 1130       Pregnancy Intention Screening   Does the patient want to become pregnant in the next year? No    Does the patient's partner want to become pregnant in the next year? No    Would the patient like to discuss contraceptive options today? No      Contraception Wrap Up   Current Method IUD or IUS    End Method IUD or IUS    Contraception Counseling Provided Yes             Examination chaperoned by Malachy Mood LPN  Assessment:     1. Sebaceous cyst of labia (Primary) Has several sebaceous cyst on labia, leave alone If start to get bigger can I&D   2. Perianal irritation When takes trulance has lots of bowel movements and gets irritated Try aquaphor 3N1 cream     Plan:     Return in November for pap and physical

## 2024-01-08 ENCOUNTER — Other Ambulatory Visit: Payer: Self-pay | Admitting: Family Medicine

## 2024-01-11 ENCOUNTER — Other Ambulatory Visit: Payer: Self-pay | Admitting: Family Medicine

## 2024-01-11 NOTE — Telephone Encounter (Signed)
 Copied from CRM [id number]. Topic: Clinical - Medication Refill >> Jan 11, 2024 12:45 PM Priscille Loveless wrote: Most Recent Primary Care Visit:  Provider: Gilmore Laroche  Department: [facility]  Visit Type: OFFICE VISIT  Date: 11/06/2023  Medication: Semaglutide-Weight Management (WEGOVY) 0.25 MG/0.5ML SOAJ   Has the patient contacted their pharmacy? Yes  [facility] - [place], [place] - [address] [place] [address] Phone: [phone] Fax: [phone]  Is this the correct pharmacy for this prescription? Yes If no, delete pharmacy and type the correct one.  This is the patient's preferred pharmacy:     Has the prescription been filled recently? Yes  Is the patient out of the medication? Yes  Has the patient been seen for an appointment in the last year OR does the patient have an upcoming appointment? Yes  Can we respond through MyChart? Yes  Agent: Please be advised that Rx refills may take up to 3 business days. We ask that you follow-up with your pharmacy.

## 2024-01-15 ENCOUNTER — Telehealth: Payer: Self-pay

## 2024-01-15 NOTE — Telephone Encounter (Signed)
 Copied from CRM [id number]. Topic: Clinical - Medication Question >> Jan 15, 2024  1:08 PM Kristin [name] wrote: Patient is upset that she hasn't gotten a response about her- Medication: Semaglutide-Weight Management (WEGOVY) 0.25 MG/0.5ML SOAJ  she wants to know if she needs a appointment sooner to get this filled. Please give her a call back today

## 2024-01-15 NOTE — Telephone Encounter (Signed)
 Patient called in regard to request below.   Patient is upset that she has not heard anything back in regard to refill on medication.  Patient wants a call back in regard

## 2024-01-17 NOTE — Telephone Encounter (Signed)
 Contacted pt. To inform her that the provider is only in office three days per week. And that we will have her review the refill request tomorrow. P.t states she understands

## 2024-01-17 NOTE — Telephone Encounter (Signed)
 Pt states she and her Pharmacy sent a refill request last Monday, she called in to f/u last Thursday and it hadn't been approved. She states she spoke with someone this Monday and still nothing. She is calling in today again requesting to speak to someone in office

## 2024-01-17 NOTE — Telephone Encounter (Signed)
 Patient is calling back very frustrated she is not getting no call back from provider or nurse, pharmacy has contacted out office has not heard back. Can someone please return her call at [phone]. Has not heard anything about her medication. What does patient need to do she will be 2 weeks without her medicine. Per patient if no response today from provider she is asking can she switch providers.

## 2024-01-18 ENCOUNTER — Other Ambulatory Visit: Payer: Self-pay | Admitting: Family Medicine

## 2024-01-18 ENCOUNTER — Telehealth: Payer: Self-pay

## 2024-01-18 MED ORDER — WEGOVY 0.5 MG/0.5ML ~~LOC~~ SOAJ
0.5000 mg | SUBCUTANEOUS | 0 refills | Status: DC
Start: 2024-01-18 — End: 2024-02-05

## 2024-01-18 NOTE — Telephone Encounter (Signed)
 Copied from CRM [id number]. Topic: Clinical - Prescription Issue >> Jan 18, 2024  3:40 PM Carlatta H wrote: Reason for CRM: Patient called to get status of Wegovy refill that was requested on 4/10//Please call patient to advised once refill has be sent to pharmacy//

## 2024-01-18 NOTE — Telephone Encounter (Signed)
 See other note

## 2024-02-05 ENCOUNTER — Encounter: Payer: Self-pay | Admitting: Family Medicine

## 2024-02-05 ENCOUNTER — Ambulatory Visit (INDEPENDENT_AMBULATORY_CARE_PROVIDER_SITE_OTHER): Payer: BC Managed Care – PPO | Admitting: Family Medicine

## 2024-02-05 VITALS — BP 125/88 | HR 76 | Ht 64.0 in | Wt 263.1 lb

## 2024-02-05 DIAGNOSIS — H6062 Unspecified chronic otitis externa, left ear: Secondary | ICD-10-CM | POA: Diagnosis not present

## 2024-02-05 DIAGNOSIS — H6060 Unspecified chronic otitis externa, unspecified ear: Secondary | ICD-10-CM | POA: Insufficient documentation

## 2024-02-05 DIAGNOSIS — E559 Vitamin D deficiency, unspecified: Secondary | ICD-10-CM | POA: Diagnosis not present

## 2024-02-05 DIAGNOSIS — E038 Other specified hypothyroidism: Secondary | ICD-10-CM

## 2024-02-05 DIAGNOSIS — G43109 Migraine with aura, not intractable, without status migrainosus: Secondary | ICD-10-CM

## 2024-02-05 DIAGNOSIS — E7849 Other hyperlipidemia: Secondary | ICD-10-CM

## 2024-02-05 DIAGNOSIS — R7301 Impaired fasting glucose: Secondary | ICD-10-CM | POA: Diagnosis not present

## 2024-02-05 MED ORDER — WEGOVY 1 MG/0.5ML ~~LOC~~ SOAJ: 1.0000 mg | SUBCUTANEOUS | 0 refills | Status: DC

## 2024-02-05 MED ORDER — OFLOXACIN 0.3 % OT SOLN: OTIC | 0 refills | Status: DC

## 2024-02-05 NOTE — Progress Notes (Signed)
 Established Patient Office Visit  Subjective:  Patient ID: Kristin [name], female    DOB: [birth year]  Age: 43 y.o. MRN: [id number]  CC:  Chief Complaint  Patient presents with   Medical Management of Chronic Issues    3 month f/u  Lft ear pain x 1 month  Pt. States that since starting wegovy  she has been really tired and nauseated .    HPI Kristin [name] is a 43 y.o. female with past medical history of migraines presents for f/u of  chronic medical conditions.  For the details of today's visit, please refer to the assessment and plan.   Past Medical History:  Diagnosis Date   Allergy    Anxiety    Atypical squamous cell changes of undetermined significance (ASCUS) on cervical cytology with negative high risk human papilloma virus (HPV) test result 03/30/2020   6/21 repeat in 3 years ASCCP guidelines 5 year risk CIN 3+ 0.27%   Carpal tunnel syndrome of right wrist 10/22/2018   Contraceptive management 11/05/2013   Depression    Encounter for well woman exam with routine gynecological exam 07/27/2022   GERD (gastroesophageal reflux disease)    IUD (intrauterine device) in place 01/13/2016   Migraines    Missed periods 01/13/2016   Vaginal Pap smear, abnormal     Past Surgical History:  Procedure Laterality Date   COLONOSCOPY N/A 05/10/2019   Normal. next colonoscopy 05/2029   ESOPHAGOGASTRODUODENOSCOPY N/A 05/10/2019   Hiatal hernia   NO PAST SURGERIES      Family History  Problem Relation Age of Onset   Multiple myeloma Paternal Grandfather    Cancer Paternal Grandmother        leukemia   Colon cancer Maternal Grandmother    Hypertension Father    Hyperlipidemia Father    Cancer Father        prostate   Other Mother        enlarged heart   Diabetes Mother    Hypertension Mother    Hyperlipidemia Mother    Heart disease Mother        heart murmer   Miscarriages / Stillbirths Mother    Breast cancer Mother    Other Brother        enlarged heart; colloid  cyst of the third ventricle( of the brain)   Hypertension Sister    Colon cancer Maternal Aunt    Gastric cancer Maternal Uncle    Esophageal cancer Maternal Uncle 39   Stomach cancer Maternal Uncle     Social History   Socioeconomic History   Marital status: Divorced    Spouse name: Not on file   Number of children: 3   Years of education: 14   Highest education level: Not on file  Occupational History   Occupation: Public house manager    Comment: [employer]  Tobacco Use   Smoking status: Never    Passive exposure: Never   Smokeless tobacco: Never  Vaping Use   Vaping status: Never Used  Substance and Sexual Activity   Alcohol use: Yes    Comment: occasional   Drug use: Never   Sexual activity: Not Currently    Birth control/protection: I.U.D.  Other Topics Concern   Not on file  Social History Narrative   Lives with parents   Looking for own place   Tries to walk daily   Social Drivers of Health   Financial Resource Strain: Medium Risk (08/03/2023)   Overall Financial Resource Strain (CARDIA)  Difficulty of Paying Living Expenses: Somewhat hard  Food Insecurity: No Food Insecurity (08/03/2023)   Hunger Vital Sign    Worried About Running Out of Food in the Last Year: Never true    Ran Out of Food in the Last Year: Never true  Transportation Needs: No Transportation Needs (08/03/2023)   PRAPARE - Administrator, Civil Service (Medical): No    Lack of Transportation (Non-Medical): No  Physical Activity: Insufficiently Active (08/03/2023)   Exercise Vital Sign    Days of Exercise per Week: 2 days    Minutes of Exercise per Session: 10 min  Stress: No Stress Concern Present (08/03/2023)   [employer] of Occupational Health - Occupational Stress Questionnaire    Feeling of Stress : Only a little  Social Connections: Moderately Isolated (08/03/2023)   Social Connection and Isolation Panel [NHANES]    Frequency of Communication with Friends  and Family: More than three times a week    Frequency of Social Gatherings with Friends and Family: More than three times a week    Attends Religious Services: More than 4 times per year    Active Member of [employer] or Organizations: No    Attends Banker Meetings: Never    Marital Status: Divorced  Catering manager Violence: Not At Risk (08/03/2023)   Humiliation, Afraid, Rape, and Kick questionnaire    Fear of Current or Ex-Partner: No    Emotionally Abused: No    Physically Abused: No    Sexually Abused: No    Outpatient Medications Prior to Visit  Medication Sig Dispense Refill   esomeprazole  (NEXIUM ) 40 MG capsule Take 1 capsule (40 mg total) by mouth 2 (two) times daily before a meal. 60 capsule 3   ibuprofen  (ADVIL ) 200 MG tablet Take 200 mg by mouth every 6 (six) hours as needed.     levonorgestrel  (MIRENA ) 20 MCG/24HR IUD 1 each by Intrauterine route once.     Plecanatide  (TRULANCE ) 3 MG TABS Take 1 tablet (3 mg total) by mouth daily. 30 tablet 5   rizatriptan  (MAXALT ) 10 MG tablet May repeat in 2 hours if needed 9 tablet 11   Topiramate  ER (TROKENDI  XR) 200 MG CP24 Take 1 capsule (200 mg total) by mouth at bedtime. PLESE USE COPAY CARD: BIN [id number] PCN [employer] GRP [id number] ID [id number] 30 capsule 11   Semaglutide -Weight Management (WEGOVY ) 0.5 MG/0.5ML SOAJ Inject 0.5 mg into the skin once a week. 2 mL 0   famotidine  (PEPCID ) 20 MG tablet Take 20 mg by mouth at bedtime. prn (Patient not taking: Reported on 02/05/2024)     SUMAtriptan  Succinate (ZEMBRACE SYMTOUCH ) 3 MG/0.5ML SOAJ Inject 3 mg into the skin once as needed for up to 1 dose. May repeat in 15 minutes. If symptoms persist, repeat in 2 hours. Max 4 injections daily. (Patient not taking: Reported on 02/05/2024) 3 mL 11   No facility-administered medications prior to visit.    Allergies  Allergen Reactions   Amoxicillin  Swelling   Nurtec [Rimegepant Sulfate]     Made her sick with vomiting and it only eased my  headache a little    Penicillins Swelling   Venlafaxine     Other reaction(s): heart palpitations    ROS Review of Systems  Constitutional:  Negative for chills and fever.  HENT:  Positive for ear pain (left).   Eyes:  Negative for visual disturbance.  Respiratory:  Negative for chest tightness and shortness of breath.   Neurological:  Negative for dizziness and headaches.      Objective:    Physical Exam HENT:     Head: Normocephalic.     Right Ear: No decreased hearing noted. No middle ear effusion. Tympanic membrane is not perforated, erythematous, retracted or bulging.     Left Ear: Hearing and tympanic membrane normal. No decreased hearing noted.  No middle ear effusion. Tympanic membrane is not perforated, erythematous, retracted or bulging.     Mouth/Throat:     Mouth: Mucous membranes are moist.  Cardiovascular:     Rate and Rhythm: Normal rate.     Heart sounds: Normal heart sounds.  Pulmonary:     Effort: Pulmonary effort is normal.     Breath sounds: Normal breath sounds.  Neurological:     Mental Status: She is alert.     BP 125/88   Pulse 76   Ht 5\' 4"  (1.626 m)   Wt 263 lb 1.3 oz (119.3 kg)   SpO2 95%   BMI 45.16 kg/m  Wt Readings from Last 3 Encounters:  02/05/24 263 lb 1.3 oz (119.3 kg)  12/28/23 266 lb (120.7 kg)  11/06/23 267 lb 1.3 oz (121.1 kg)    Lab Results  Component Value Date   TSH 2.570 09/14/2023   Lab Results  Component Value Date   WBC 5.9 09/14/2023   HGB 12.7 09/14/2023   HCT 41.0 09/14/2023   MCV 87 09/14/2023   PLT 307 09/14/2023   Lab Results  Component Value Date   NA 140 09/14/2023   K 4.0 09/14/2023   CO2 23 09/14/2023   GLUCOSE 86 09/14/2023   BUN 13 09/14/2023   CREATININE 0.73 09/14/2023   BILITOT 0.7 09/14/2023   ALKPHOS 145 (H) 09/14/2023   AST 14 09/14/2023   ALT 10 09/14/2023   PROT 6.6 09/14/2023   ALBUMIN 4.0 09/14/2023   CALCIUM 9.3 09/14/2023   ANIONGAP 5 12/21/2022   EGFR 104 09/14/2023    Lab Results  Component Value Date   CHOL 165 09/14/2023   Lab Results  Component Value Date   HDL 47 09/14/2023   Lab Results  Component Value Date   LDLCALC 106 (H) 09/14/2023   Lab Results  Component Value Date   TRIG 58 09/14/2023   Lab Results  Component Value Date   CHOLHDL 3.5 09/14/2023   Lab Results  Component Value Date   HGBA1C 5.3 09/14/2023      Assessment & Plan:  Chronic otitis externa of left ear, unspecified type Assessment & Plan: The patient complains of intermittent sharp pain in her left ear for approximately one month. She reports a history of allergies but denies symptoms of nasal congestion, runny nose, headache, or fever. She also denies ear drainage or decreased hearing. The patient will be treated today with topical ofloxacin 0.3% otic solution, to be applied to the left ear twice daily for seven days. She was encouraged to follow up if symptoms worsen or fail to improve and to continue taking her over-the-counter allergy medication as directed. The patient verbalized understanding of the treatment plan.   Orders: -     Ofloxacin; Place 5 drops into the left ear twice daily for 7 days  Dispense: 5 mL; Refill: 0  Morbid obesity (HCC) Assessment & Plan: Please inform the patient that fatigue and nausea are common side effects of Wegovy , especially during dose escalation. She is encouraged to stay well hydrated and eat well-balanced meals. She was advised to report any symptoms  of abdominal pain, vomiting, or signs of dehydration. At this time, her nausea is tolerable; however, Zofran  may be considered if symptoms worsen.   Orders: -     Wegovy ; Inject 1 mg into the skin once a week.  Dispense: 2 mL; Refill: 0  Migraine with aura and without status migrainosus, not intractable Assessment & Plan: Stable on topamax  and rizatriptans No recent migraines episodes, concerns or complaints Encouraged to continue treatment regimen as is   IFG  (impaired fasting glucose) -     Hemoglobin A1c  Vitamin D  deficiency -     VITAMIN D  25 Hydroxy (Vit-D Deficiency, Fractures)  TSH (thyroid -stimulating hormone deficiency) -     TSH + free T4  Other hyperlipidemia -     Lipid panel -     CMP14+EGFR -     CBC with Differential/Platelet  Note: This chart has been completed using Engineer, civil (consulting) software, and while attempts have been made to ensure accuracy, certain words and phrases may not be transcribed as intended.    Follow-up: Return in about 5 months (around 07/07/2024).   Jahzion Brogden, FNP

## 2024-02-05 NOTE — Patient Instructions (Addendum)
 I appreciate the opportunity to provide care to you today!    Follow up:  5 months  Labs: please stop by the lab today to get your blood drawn (CBC, CMP, TSH, Lipid profile, HgA1c, Vit D)  Left Ear Pain: A prescription for ofloxacin 0.3% otic solution has been sent to your pharmacy. Please continue taking your allergy medication as prescribed. Recommend applying warm compresses to the affected ear and using over-the-counter analgesics (e.g., acetaminophen  or ibuprofen ) for pain relief. Advise follow-up if pain worsens, hearing changes occur, or symptoms persist beyond one week.  Fatigue and Nausea (related to Wegovy ): Fatigue and nausea are known side effects of Wegovy , particularly early in treatment or with dose escalation. Recommend taking Wegovy  with food and staying well hydrated. Monitor symptoms and assess if they improve with time or dose adjustment. Please report any additional symptoms such as abdominal pain, vomiting, or signs of dehydration   Please continue to a heart-healthy diet and increase your physical activities. Try to exercise for at least five days a week.    It was a pleasure to see you and I look forward to continuing to work together on your health and well-being. Please do not hesitate to call the office if you need care or have questions about your care.  In case of emergency, please visit the Emergency Department for urgent care, or contact our clinic at [phone] to schedule an appointment. We're here to help you!   Have a wonderful day and week. With Gratitude, Dasiah Hooley MSN, FNP-BC

## 2024-02-05 NOTE — Assessment & Plan Note (Signed)
 Please inform the patient that fatigue and nausea are common side effects of Wegovy , especially during dose escalation. She is encouraged to stay well hydrated and eat well-balanced meals. She was advised to report any symptoms of abdominal pain, vomiting, or signs of dehydration. At this time, her nausea is tolerable; however, Zofran  may be considered if symptoms worsen.

## 2024-02-05 NOTE — Assessment & Plan Note (Signed)
 The patient complains of intermittent sharp pain in her left ear for approximately one month. She reports a history of allergies but denies symptoms of nasal congestion, runny nose, headache, or fever. She also denies ear drainage or decreased hearing. The patient will be treated today with topical ofloxacin 0.3% otic solution, to be applied to the left ear twice daily for seven days. She was encouraged to follow up if symptoms worsen or fail to improve and to continue taking her over-the-counter allergy medication as directed. The patient verbalized understanding of the treatment plan.

## 2024-02-05 NOTE — Assessment & Plan Note (Signed)
 Stable on topamax  and rizatriptans No recent migraines episodes, concerns or complaints Encouraged to continue treatment regimen as is

## 2024-02-06 LAB — TSH+FREE T4
Free T4: 1 ng/dL (ref 0.82–1.77)
TSH: 1.95 u[IU]/mL (ref 0.450–4.500)

## 2024-02-06 LAB — CBC WITH DIFFERENTIAL/PLATELET
Basophils Absolute: 0 10*3/uL (ref 0.0–0.2)
Basos: 1 %
EOS (ABSOLUTE): 0.1 10*3/uL (ref 0.0–0.4)
Eos: 2 %
Hematocrit: 42.7 % (ref 34.0–46.6)
Hemoglobin: 13.6 g/dL (ref 11.1–15.9)
Immature Grans (Abs): 0 10*3/uL (ref 0.0–0.1)
Immature Granulocytes: 0 %
Lymphocytes Absolute: 2.4 10*3/uL (ref 0.7–3.1)
Lymphs: 38 %
MCH: 27.4 pg (ref 26.6–33.0)
MCHC: 31.9 g/dL (ref 31.5–35.7)
MCV: 86 fL (ref 79–97)
Monocytes Absolute: 0.5 10*3/uL (ref 0.1–0.9)
Monocytes: 8 %
Neutrophils Absolute: 3.2 10*3/uL (ref 1.4–7.0)
Neutrophils: 51 %
Platelets: 324 10*3/uL (ref 150–450)
RBC: 4.96 x10E6/uL (ref 3.77–5.28)
RDW: 13 % (ref 11.7–15.4)
WBC: 6.3 10*3/uL (ref 3.4–10.8)

## 2024-02-06 LAB — CMP14+EGFR
ALT: 11 IU/L (ref 0–32)
AST: 13 IU/L (ref 0–40)
Albumin: 4.2 g/dL (ref 3.9–4.9)
Alkaline Phosphatase: 151 IU/L — ABNORMAL HIGH (ref 44–121)
BUN/Creatinine Ratio: 10 (ref 9–23)
BUN: 7 mg/dL (ref 6–24)
Bilirubin Total: 1.1 mg/dL (ref 0.0–1.2)
CO2: 19 mmol/L — ABNORMAL LOW (ref 20–29)
Calcium: 9.2 mg/dL (ref 8.7–10.2)
Chloride: 106 mmol/L (ref 96–106)
Creatinine, Ser: 0.68 mg/dL (ref 0.57–1.00)
Globulin, Total: 2.9 g/dL (ref 1.5–4.5)
Glucose: 85 mg/dL (ref 70–99)
Potassium: 4.6 mmol/L (ref 3.5–5.2)
Sodium: 141 mmol/L (ref 134–144)
Total Protein: 7.1 g/dL (ref 6.0–8.5)
eGFR: 109 mL/min/{1.73_m2} (ref >59)

## 2024-02-06 LAB — LIPID PANEL
Chol/HDL Ratio: 3.5 ratio (ref 0.0–4.4)
Cholesterol, Total: 150 mg/dL (ref 100–199)
HDL: 43 mg/dL (ref >39)
LDL Chol Calc (NIH): 95 mg/dL (ref 0–99)
Triglycerides: 59 mg/dL (ref 0–149)
VLDL Cholesterol Cal: 12 mg/dL (ref 5–40)

## 2024-02-06 LAB — VITAMIN D 25 HYDROXY (VIT D DEFICIENCY, FRACTURES): Vit D, 25-Hydroxy: 36.6 ng/mL (ref 30.0–100.0)

## 2024-02-06 LAB — HEMOGLOBIN A1C
Est. average glucose Bld gHb Est-mCnc: 100 mg/dL
Hgb A1c MFr Bld: 5.1 % (ref 4.8–5.6)

## 2024-02-24 ENCOUNTER — Ambulatory Visit: Payer: Self-pay | Admitting: Family Medicine

## 2024-02-26 ENCOUNTER — Ambulatory Visit
Admission: RE | Admit: 2024-02-26 | Discharge: 2024-02-26 | Disposition: A | Discharge status: Home / Self Care | Source: Ambulatory Visit | Attending: Family Medicine | Admitting: Family Medicine

## 2024-02-26 VITALS — BP 135/87 | HR 73 | Temp 98.4°F | Resp 20

## 2024-02-26 DIAGNOSIS — G43809 Other migraine, not intractable, without status migrainosus: Secondary | ICD-10-CM

## 2024-02-26 DIAGNOSIS — J309 Allergic rhinitis, unspecified: Secondary | ICD-10-CM

## 2024-02-26 MED ORDER — AZELASTINE HCL 0.1 % NA SOLN: 1.0000 | Freq: Two times a day (BID) | NASAL | 0 refills | Status: DC

## 2024-02-26 MED ORDER — DEXAMETHASONE SODIUM PHOSPHATE 10 MG/ML IJ SOLN
10.0000 mg | Freq: Once | INTRAMUSCULAR | Status: AC
  Administered 2024-02-26: 10 mg via INTRAMUSCULAR

## 2024-02-26 NOTE — ED Triage Notes (Signed)
 Pt reports headache, pressure in the face, eyes and ears x 1 week

## 2024-02-26 NOTE — Discharge Instructions (Signed)
 We have given you a steroid shot today for pain and inflammation.  Continue your allergy regimen and start the nasal spray that I have prescribed twice daily.  You may also do decongestants as needed, sinus rinses, humidifiers and your migraine medications as needed

## 2024-02-26 NOTE — ED Provider Notes (Signed)
 [facility]    CSN: [id number] Arrival date & time: 02/26/24  4098      History   Chief Complaint Chief Complaint  Patient presents with   Headache    Constant headaches daily for the past week. Feels like pressure in my head and behind my eyeballs. My ear and face have been aching. - Entered by patient    HPI Kristin [name] is a 43 y.o. female.   Patient presenting today with 1 week history of headache, sinus pressure, ear pressure and popping.  Denies visual change, mental status change, numbness, tingling, weakness of extremities.  She feels that her allergy symptoms are flaring up her chronic migraines.  Has been trying her Maxalt  and taking Topamax  which she states does help temporarily but headache returns as soon as it wears off.  Also takes antihistamine daily for seasonal allergies.    Past Medical History:  Diagnosis Date   Allergy    Anxiety    Atypical squamous cell changes of undetermined significance (ASCUS) on cervical cytology with negative high risk human papilloma virus (HPV) test result 03/30/2020   6/21 repeat in 3 years ASCCP guidelines 5 year risk CIN 3+ 0.27%   Carpal tunnel syndrome of right wrist 10/22/2018   Contraceptive management 11/05/2013   Depression    Encounter for well woman exam with routine gynecological exam 07/27/2022   GERD (gastroesophageal reflux disease)    IUD (intrauterine device) in place 01/13/2016   Migraines    Missed periods 01/13/2016   Vaginal Pap smear, abnormal     Patient Active Problem List   Diagnosis Date Noted   Chronic otitis externa 02/05/2024   Perianal irritation 12/28/2023   Sebaceous cyst of labia 12/28/2023   Encounter for IUD removal and reinsertion 08/14/2023   Encounter for immunization 08/14/2023   COVID-19 long hauler manifesting chronic fatigue 08/14/2023   IUD threads lost 08/03/2023   Acute frontal sinusitis 06/02/2023   Muscle spasm 05/05/2023   Breast lump on left side at 7  o'clock position 01/12/2023   Fatigue 12/03/2022   GAD (generalized anxiety disorder) 11/04/2022   Encounter for examination following treatment at hospital 11/04/2022   Left acute otitis media 07/29/2022   Vertigo 07/29/2022   Nausea 07/29/2022   Anxiety and depression 07/29/2022   Encounter for well woman exam with routine gynecological exam 07/27/2022   Upper back pain on left side 07/27/2022   Morbid obesity (HCC) 07/27/2022   Shortness of breath with exposure to severe acute respiratory syndrome coronavirus 2 (SARS-CoV-2) 09/07/2021   Elevated C-reactive protein (CRP) 04/19/2021   Hepatomegaly 04/19/2021   Hemorrhoids 03/25/2021   Encounter for screening fecal occult blood testing 03/25/2021   Infrapatellar bursitis of left knee 05/14/2020   Atypical squamous cell changes of undetermined significance (ASCUS) on cervical cytology with negative high risk human papilloma virus (HPV) test result 03/30/2020   Neck pain 10/10/2019   Lower abdominal pain 04/19/2019   GERD (gastroesophageal reflux disease) 03/18/2019   Change in stool habits 03/18/2019   Abnormal CT of the abdomen 03/18/2019   Family planning 10/22/2018   Plantar fasciitis, right 10/22/2018   Carpal tunnel syndrome of right wrist 10/22/2018   IUD (intrauterine device) in place 01/13/2016   Migraines 03/07/2013    Past Surgical History:  Procedure Laterality Date   COLONOSCOPY N/A 05/10/2019   Normal. next colonoscopy 05/2029   ESOPHAGOGASTRODUODENOSCOPY N/A 05/10/2019   Hiatal hernia   NO PAST SURGERIES      OB History  Gravida  3   Para  3   Term  3   Preterm      AB      Living  3      SAB      IAB      Ectopic      Multiple  0   Live Births  3            Home Medications    Prior to Admission medications   Medication Sig Start Date End Date Taking? Authorizing Provider  azelastine  (ASTELIN ) 0.1 % nasal spray Place 1 spray into both nostrils 2 (two) times daily. Use in each  nostril as directed 02/26/24  Yes Corbin Dess, PA-C  esomeprazole  (NEXIUM ) 40 MG capsule Take 1 capsule (40 mg total) by mouth 2 (two) times daily before a meal. 01/31/23   Mahon, Martine Sleek, NP  famotidine  (PEPCID ) 20 MG tablet Take 20 mg by mouth at bedtime. prn Patient not taking: Reported on 02/05/2024    [provider]  ibuprofen  (ADVIL ) 200 MG tablet Take 200 mg by mouth every 6 (six) hours as needed.    [provider]  levonorgestrel  (MIRENA ) 20 MCG/24HR IUD 1 each by Intrauterine route once.    [provider]  ofloxacin  (FLOXIN ) 0.3 % OTIC solution Place 5 drops into the left ear twice daily for 7 days 02/05/24   Zarwolo, Gloria, FNP  Plecanatide  (TRULANCE ) 3 MG TABS Take 1 tablet (3 mg total) by mouth daily. 04/11/23   Eustacio Highman, NP  rizatriptan  (MAXALT ) 10 MG tablet May repeat in 2 hours if needed 09/11/23   Glory Larsen, MD  Semaglutide -Weight Management (WEGOVY ) 1 MG/0.5ML SOAJ Inject 1 mg into the skin once a week. 02/05/24   Zarwolo, Gloria, FNP  SUMAtriptan  Succinate (ZEMBRACE SYMTOUCH ) 3 MG/0.5ML SOAJ Inject 3 mg into the skin once as needed for up to 1 dose. May repeat in 15 minutes. If symptoms persist, repeat in 2 hours. Max 4 injections daily. Patient not taking: Reported on 02/05/2024 09/11/23   Glory Larsen, MD  Topiramate  ER (TROKENDI  XR) 200 MG CP24 Take 1 capsule (200 mg total) by mouth at bedtime. PLESE USE COPAY CARD: BIN [id number] PCN [employer] GRP [id number] ID [id number] 09/11/23   Glory Larsen, MD    Family History Family History  Problem Relation Age of Onset   Multiple myeloma Paternal Grandfather    Cancer Paternal Grandmother        leukemia   Colon cancer Maternal Grandmother    Hypertension Father    Hyperlipidemia Father    Cancer Father        prostate   Other Mother        enlarged heart   Diabetes Mother    Hypertension Mother    Hyperlipidemia Mother    Heart disease Mother        heart murmer    Miscarriages / Stillbirths Mother    Breast cancer Mother    Other Brother        enlarged heart; colloid cyst of the third ventricle( of the brain)   Hypertension Sister    Colon cancer Maternal Aunt    Gastric cancer Maternal Uncle    Esophageal cancer Maternal Uncle 24   Stomach cancer Maternal Uncle     Social History Social History   Tobacco Use   Smoking status: Never    Passive exposure: Never   Smokeless tobacco: Never  Vaping Use  Vaping status: Never Used  Substance Use Topics   Alcohol use: Yes    Comment: occasional   Drug use: Never     Allergies   Amoxicillin , Nurtec [rimegepant sulfate], Penicillins, and Venlafaxine   Review of Systems Review of Systems HPI  Physical Exam Triage Vital Signs ED Triage Vitals  Encounter Vitals Group     BP 02/26/24 0941 135/87     Systolic BP Percentile --      Diastolic BP Percentile --      Pulse Rate 02/26/24 0941 73     Resp 02/26/24 0941 20     Temp 02/26/24 0941 98.4 F (36.9 C)     Temp Source 02/26/24 0941 Oral     SpO2 02/26/24 0941 96 %     Weight --      Height --      Head Circumference --      Peak Flow --      Pain Score 02/26/24 0944 3     Pain Loc --      Pain Education --      Exclude from Growth Chart --    No data found.  Updated Vital Signs BP 135/87 (BP Location: Right Arm)   Pulse 73   Temp 98.4 F (36.9 C) (Oral)   Resp 20   SpO2 96%   Visual Acuity Right Eye Distance:   Left Eye Distance:   Bilateral Distance:    Right Eye Near:   Left Eye Near:    Bilateral Near:     Physical Exam Vitals and nursing note reviewed.  Constitutional:      Appearance: Normal appearance. She is not ill-appearing.  HENT:     Head: Atraumatic.     Ears:     Comments: Bilateral middle ear effusions    Nose: Rhinorrhea present.     Mouth/Throat:     Mouth: Mucous membranes are moist.     Pharynx: Oropharynx is clear.  Eyes:     Extraocular Movements: Extraocular movements intact.      Conjunctiva/sclera: Conjunctivae normal.  Cardiovascular:     Rate and Rhythm: Normal rate and regular rhythm.     Heart sounds: Normal heart sounds.  Pulmonary:     Effort: Pulmonary effort is normal.     Breath sounds: Normal breath sounds.  Musculoskeletal:        General: Normal range of motion.     Cervical back: Normal range of motion and neck supple.  Skin:    General: Skin is warm and dry.  Neurological:     Mental Status: She is alert and oriented to person, place, and time.     Cranial Nerves: No cranial nerve deficit.     Motor: No weakness.     Gait: Gait normal.  Psychiatric:        Mood and Affect: Mood normal.        Thought Content: Thought content normal.        Judgment: Judgment normal.      UC Treatments / Results  Labs (all labs ordered are listed, but only abnormal results are displayed) Labs Reviewed - No data to display  EKG   Radiology No results found.  Procedures Procedures (including critical care time)  Medications Ordered in UC Medications  dexamethasone (DECADRON) injection 10 mg (10 mg Intramuscular Given 02/26/24 1032)    Initial Impression / Assessment and Plan / UC Course  I have reviewed the triage vital signs and the  nursing notes.  Pertinent labs & imaging results that were available during my care of the patient were reviewed by me and considered in my medical decision making (see chart for details).     Suspect allergic sinusitis causing migraine exacerbation.  Treat with IM Decadron, antihistamines, Astelin  nasal spray, supportive home care.  Continue migraine regimen.  Return for worsening symptoms.  Final Clinical Impressions(s) / UC Diagnoses   Final diagnoses:  Allergic sinusitis  Other migraine without status migrainosus, not intractable     Discharge Instructions      We have given you a steroid shot today for pain and inflammation.  Continue your allergy regimen and start the nasal spray that I have  prescribed twice daily.  You may also do decongestants as needed, sinus rinses, humidifiers and your migraine medications as needed  ED Prescriptions     Medication Sig Dispense Auth. Provider   azelastine  (ASTELIN ) 0.1 % nasal spray Place 1 spray into both nostrils 2 (two) times daily. Use in each nostril as directed 30 mL Corbin Dess, PA-C      PDMP not reviewed this encounter.   Corbin Dess, [place] 02/26/24 [phone]

## 2024-03-03 ENCOUNTER — Ambulatory Visit
Admission: EM | Admit: 2024-03-03 | Discharge: 2024-03-03 | Disposition: A | Discharge status: Home / Self Care | Attending: Physician Assistant | Admitting: Physician Assistant

## 2024-03-03 ENCOUNTER — Encounter: Payer: Self-pay | Admitting: Emergency Medicine

## 2024-03-03 ENCOUNTER — Other Ambulatory Visit: Payer: Self-pay

## 2024-03-03 DIAGNOSIS — J0140 Acute pansinusitis, unspecified: Secondary | ICD-10-CM

## 2024-03-03 LAB — POCT RAPID STREP A (OFFICE): Rapid Strep A Screen: NEGATIVE

## 2024-03-03 MED ORDER — DOXYCYCLINE HYCLATE 100 MG PO CAPS: 100.0000 mg | ORAL_CAPSULE | Freq: Two times a day (BID) | ORAL | 0 refills | Status: DC

## 2024-03-03 NOTE — ED Provider Notes (Signed)
 [facility]    CSN: [id number] Arrival date & time: 03/03/24  1204      History   Chief Complaint Chief Complaint  Patient presents with   Sore Throat    HPI Kristin [name] is a 43 y.o. female.   Patient presents today with a several week history of URI symptoms.  She was seen by our clinic on 02/26/2024 at which point symptoms were attributed to allergies she was given Decadron  injection.  She did have some improvement of symptoms but they never resolved.  She continues to have congestion, sore throat, cough.  Denies any chest pain, shortness of breath, fever, nausea, vomiting.  She does have a history of seasonal allergies and has been using over-the-counter antihistamine as well as prescribed Astelin  nasal spray without improvement of symptoms.  She denies any recent antibiotics or steroids.  Does report her daughter was sick with similar symptoms.  She reports that the sore throat has gotten much worse in the past several days and she is concerned that she might of been exposed to strep.  She is confident that she is not pregnant as she has an IUD.  Denies any history of diabetes, asthma, COPD, smoking.    Past Medical History:  Diagnosis Date   Allergy    Anxiety    Atypical squamous cell changes of undetermined significance (ASCUS) on cervical cytology with negative high risk human papilloma virus (HPV) test result 03/30/2020   6/21 repeat in 3 years ASCCP guidelines 5 year risk CIN 3+ 0.27%   Carpal tunnel syndrome of right wrist 10/22/2018   Contraceptive management 11/05/2013   Depression    Encounter for well woman exam with routine gynecological exam 07/27/2022   GERD (gastroesophageal reflux disease)    IUD (intrauterine device) in place 01/13/2016   Migraines    Missed periods 01/13/2016   Vaginal Pap smear, abnormal     Patient Active Problem List   Diagnosis Date Noted   Chronic otitis externa 02/05/2024   Perianal irritation 12/28/2023    Sebaceous cyst of labia 12/28/2023   Encounter for IUD removal and reinsertion 08/14/2023   Encounter for immunization 08/14/2023   COVID-19 long hauler manifesting chronic fatigue 08/14/2023   IUD threads lost 08/03/2023   Acute frontal sinusitis 06/02/2023   Muscle spasm 05/05/2023   Breast lump on left side at 7 o'clock position 01/12/2023   Fatigue 12/03/2022   GAD (generalized anxiety disorder) 11/04/2022   Encounter for examination following treatment at hospital 11/04/2022   Left acute otitis media 07/29/2022   Vertigo 07/29/2022   Nausea 07/29/2022   Anxiety and depression 07/29/2022   Encounter for well woman exam with routine gynecological exam 07/27/2022   Upper back pain on left side 07/27/2022   Morbid obesity (HCC) 07/27/2022   Shortness of breath with exposure to severe acute respiratory syndrome coronavirus 2 (SARS-CoV-2) 09/07/2021   Elevated C-reactive protein (CRP) 04/19/2021   Hepatomegaly 04/19/2021   Hemorrhoids 03/25/2021   Encounter for screening fecal occult blood testing 03/25/2021   Infrapatellar bursitis of left knee 05/14/2020   Atypical squamous cell changes of undetermined significance (ASCUS) on cervical cytology with negative high risk human papilloma virus (HPV) test result 03/30/2020   Neck pain 10/10/2019   Lower abdominal pain 04/19/2019   GERD (gastroesophageal reflux disease) 03/18/2019   Change in stool habits 03/18/2019   Abnormal CT of the abdomen 03/18/2019   Family planning 10/22/2018   Plantar fasciitis, right 10/22/2018   Carpal tunnel syndrome  of right wrist 10/22/2018   IUD (intrauterine device) in place 01/13/2016   Migraines 03/07/2013    Past Surgical History:  Procedure Laterality Date   COLONOSCOPY N/A 05/10/2019   Normal. next colonoscopy 05/2029   ESOPHAGOGASTRODUODENOSCOPY N/A 05/10/2019   Hiatal hernia   NO PAST SURGERIES      OB History     Gravida  3   Para  3   Term  3   Preterm      AB      Living  3       SAB      IAB      Ectopic      Multiple  0   Live Births  3            Home Medications    Prior to Admission medications   Medication Sig Start Date End Date Taking? Authorizing Provider  doxycycline  (VIBRAMYCIN ) 100 MG capsule Take 1 capsule (100 mg total) by mouth 2 (two) times daily. 03/03/24  Yes Jahlani Lorentz K, PA-C  azelastine  (ASTELIN ) 0.1 % nasal spray Place 1 spray into both nostrils 2 (two) times daily. Use in each nostril as directed 02/26/24   Corbin Dess, PA-C  esomeprazole  (NEXIUM ) 40 MG capsule Take 1 capsule (40 mg total) by mouth 2 (two) times daily before a meal. 01/31/23   Eustacio Highman, NP  famotidine  (PEPCID ) 20 MG tablet Take 20 mg by mouth at bedtime. prn Patient not taking: Reported on 02/05/2024    [provider]  ibuprofen  (ADVIL ) 200 MG tablet Take 200 mg by mouth every 6 (six) hours as needed.    [provider]  levonorgestrel  (MIRENA ) 20 MCG/24HR IUD 1 each by Intrauterine route once.    [provider]  ofloxacin  (FLOXIN ) 0.3 % OTIC solution Place 5 drops into the left ear twice daily for 7 days 02/05/24   Zarwolo, Gloria, FNP  Plecanatide  (TRULANCE ) 3 MG TABS Take 1 tablet (3 mg total) by mouth daily. 04/11/23   Eustacio Highman, NP  rizatriptan  (MAXALT ) 10 MG tablet May repeat in 2 hours if needed 09/11/23   Glory Larsen, MD  Semaglutide -Weight Management (WEGOVY ) 1 MG/0.5ML SOAJ Inject 1 mg into the skin once a week. 02/05/24   Zarwolo, Gloria, FNP  SUMAtriptan  Succinate (ZEMBRACE SYMTOUCH ) 3 MG/0.5ML SOAJ Inject 3 mg into the skin once as needed for up to 1 dose. May repeat in 15 minutes. If symptoms persist, repeat in 2 hours. Max 4 injections daily. Patient not taking: Reported on 02/05/2024 09/11/23   Glory Larsen, MD  Topiramate  ER (TROKENDI  XR) 200 MG CP24 Take 1 capsule (200 mg total) by mouth at bedtime. PLESE USE COPAY CARD: BIN [id number] PCN [employer] [id number] ID [id number] 09/11/23   Glory Larsen,  MD    Family History Family History  Problem Relation Age of Onset   Multiple myeloma Paternal Grandfather    Cancer Paternal Grandmother        leukemia   Colon cancer Maternal Grandmother    Hypertension Father    Hyperlipidemia Father    Cancer Father        prostate   Other Mother        enlarged heart   Diabetes Mother    Hypertension Mother    Hyperlipidemia Mother    Heart disease Mother        heart murmer   Miscarriages / Stillbirths Mother    Breast cancer Mother  Other Brother        enlarged heart; colloid cyst of the third ventricle( of the brain)   Hypertension Sister    Colon cancer Maternal Aunt    Gastric cancer Maternal Uncle    Esophageal cancer Maternal Uncle 66   Stomach cancer Maternal Uncle     Social History Social History   Tobacco Use   Smoking status: Never    Passive exposure: Never   Smokeless tobacco: Never  Vaping Use   Vaping status: Never Used  Substance Use Topics   Alcohol use: Yes    Comment: occasional   Drug use: Never     Allergies   Amoxicillin , Nurtec [rimegepant sulfate], Penicillins, and Venlafaxine   Review of Systems Review of Systems  Constitutional:  Positive for activity change. Negative for appetite change, fatigue and fever.  HENT:  Positive for congestion, postnasal drip and sore throat. Negative for sinus pressure and sneezing.   Respiratory:  Positive for cough. Negative for shortness of breath.   Cardiovascular:  Negative for chest pain.  Gastrointestinal:  Negative for abdominal pain, diarrhea, nausea and vomiting.  Neurological:  Positive for headaches. Negative for dizziness and light-headedness.     Physical Exam Triage Vital Signs ED Triage Vitals  Encounter Vitals Group     BP 03/03/24 1228 125/80     Systolic BP Percentile --      Diastolic BP Percentile --      Pulse Rate 03/03/24 1228 78     Resp 03/03/24 1228 20     Temp 03/03/24 1228 99 F (37.2 C)     Temp Source 03/03/24 1228  Oral     SpO2 03/03/24 1228 98 %     Weight --      Height --      Head Circumference --      Peak Flow --      Pain Score 03/03/24 1230 4     Pain Loc --      Pain Education --      Exclude from Growth Chart --    No data found.  Updated Vital Signs BP 125/80 (BP Location: Right Arm)   Pulse 78   Temp 99 F (37.2 C) (Oral)   Resp 20   SpO2 98%   Visual Acuity Right Eye Distance:   Left Eye Distance:   Bilateral Distance:    Right Eye Near:   Left Eye Near:    Bilateral Near:     Physical Exam Vitals reviewed.  Constitutional:      General: She is awake. She is not in acute distress.    Appearance: Normal appearance. She is well-developed. She is not ill-appearing.     Comments: Very pleasant female appears stated age, in no acute distress, sitting comfortably in exam room  HENT:     Head: Normocephalic and atraumatic.     Right Ear: Tympanic membrane, ear canal and external ear normal. Tympanic membrane is not erythematous or bulging.     Left Ear: Tympanic membrane, ear canal and external ear normal. Tympanic membrane is not erythematous or bulging.     Nose:     Right Sinus: Maxillary sinus tenderness present. No frontal sinus tenderness.     Left Sinus: Maxillary sinus tenderness present. No frontal sinus tenderness.     Mouth/Throat:     Pharynx: Uvula midline. Posterior oropharyngeal erythema and postnasal drip present. No oropharyngeal exudate.  Cardiovascular:     Rate and Rhythm: Normal rate  and regular rhythm.     Heart sounds: Normal heart sounds, S1 normal and S2 normal. No murmur heard. Pulmonary:     Effort: Pulmonary effort is normal.     Breath sounds: Normal breath sounds. No wheezing, rhonchi or rales.     Comments: Clear to auscultation bilaterally Psychiatric:        Behavior: Behavior is cooperative.      UC Treatments / Results  Labs (all labs ordered are listed, but only abnormal results are displayed) Labs Reviewed  POCT RAPID  STREP A (OFFICE)    EKG   Radiology No results found.  Procedures Procedures (including critical care time)  Medications Ordered in UC Medications - No data to display  Initial Impression / Assessment and Plan / UC Course  I have reviewed the triage vital signs and the nursing notes.  Pertinent labs & imaging results that were available during my care of the patient were reviewed by me and considered in my medical decision making (see chart for details).     Patient is well-appearing, afebrile, nontoxic, nontachycardic.  No indication for viral testing as she has been symptomatic for several weeks and this would not change her management.  She was concerned for strep and so strep testing was obtained and was negative.  Chest x-ray was deferred as she had no adventitious lung sounds on exam and her oxygen saturation was 98%.  Concern for sinus infection with postnasal drainage causing ongoing sore throat symptoms.  She was encouraged to continue over-the-counter medications including Mucinex, Flonase , Tylenol  as well as gargle with warm salt water .  She can use nasal saline sinus rinses for additional symptom relief.  Given her prolonged and worsening symptoms will cover with doxycycline  twice daily for 10 days.  We discussed that she should avoid prolonged sun exposure while on this medication due to associated photosensitivity.  We discussed that if she has any worsening or changing symptoms or if she is not feeling better within a few days she should return for reevaluation.  Strict return precautions given.  Excuse note provided.  Final Clinical Impressions(s) / UC Diagnoses   Final diagnoses:  Acute non-recurrent pansinusitis     Discharge Instructions      You are negative for strep.  We are going to treat for a sinus infection.  Start doxycycline  100 mg twice daily for 10 days.  Stay out of the sun while on this medication as it can cause you to have a sunburn.  Continue your  allergy medication as well as over-the-counter medication such as Mucinex, Flonase , nasal saline/sinus rinses.  I also recommend you gargle with warm salt water .  If you are not feeling significantly better within 3 to 5 days please return for reevaluation.  If anything worsens and you have high fever, worsening cough, shortness of breath you need to be seen immediately.   ED Prescriptions     Medication Sig Dispense Auth. Provider   doxycycline  (VIBRAMYCIN ) 100 MG capsule Take 1 capsule (100 mg total) by mouth 2 (two) times daily. 20 capsule Lexy Meininger K, PA-C      PDMP not reviewed this encounter.   Budd Cargo, PA-C 03/03/24 1336

## 2024-03-03 NOTE — Discharge Instructions (Signed)
 You are negative for strep.  We are going to treat for a sinus infection.  Start doxycycline  100 mg twice daily for 10 days.  Stay out of the sun while on this medication as it can cause you to have a sunburn.  Continue your allergy medication as well as over-the-counter medication such as Mucinex, Flonase , nasal saline/sinus rinses.  I also recommend you gargle with warm salt water .  If you are not feeling significantly better within 3 to 5 days please return for reevaluation.  If anything worsens and you have high fever, worsening cough, shortness of breath you need to be seen immediately.

## 2024-03-03 NOTE — ED Triage Notes (Addendum)
 Pt reports "itchy" sore throat, nausea, cough with "white phlegm, and reports "not it feels likes its in my chest.  Was seen on Monday and dx with sinus infection and received steroid injection.reports daughter has something similar but tested negative for covid/flu when she was seen.

## 2024-03-07 NOTE — Telephone Encounter (Signed)
 [employer] generated an appeal letter for us . Dr Tresia Fruit has signed letter and appeal has been faxed to [facility] appeals dept. Received a receipt of confirmation.

## 2024-03-08 ENCOUNTER — Other Ambulatory Visit: Payer: Self-pay | Admitting: Family Medicine

## 2024-03-11 ENCOUNTER — Ambulatory Visit: Payer: Self-pay | Admitting: Family Medicine

## 2024-03-11 ENCOUNTER — Ambulatory Visit: Payer: Self-pay | Admitting: *Deleted

## 2024-03-11 ENCOUNTER — Other Ambulatory Visit: Payer: Self-pay | Admitting: Family Medicine

## 2024-03-11 ENCOUNTER — Ambulatory Visit: Payer: Self-pay

## 2024-03-11 ENCOUNTER — Other Ambulatory Visit: Payer: Self-pay | Admitting: Gastroenterology

## 2024-03-11 DIAGNOSIS — K219 Gastro-esophageal reflux disease without esophagitis: Secondary | ICD-10-CM

## 2024-03-11 MED ORDER — WEGOVY 1.7 MG/0.75ML ~~LOC~~ SOAJ: 1.7000 mg | SUBCUTANEOUS | 0 refills | Status: DC

## 2024-03-11 MED ORDER — SUCRALFATE 1 G PO TABS: 1.0000 g | ORAL_TABLET | Freq: Three times a day (TID) | ORAL | 1 refills | Status: DC

## 2024-03-11 MED ORDER — LIDOCAINE VISCOUS HCL 2 % MT SOLN: 15.0000 mL | Freq: Two times a day (BID) | OROMUCOSAL | 0 refills | Status: DC | PRN

## 2024-03-11 NOTE — Telephone Encounter (Signed)
 FYI Only or Action Required?: Action required by provider  Patient was last seen in primary care on 02/05/2024 by Zarwolo, Gloria, FNP. Called Nurse Triage reporting Oral Swelling. Symptoms began several days ago. Interventions attempted: Rest, hydration, or home remedies. Symptoms are: unchanged.  Triage Disposition: See Physician Within 24 Hours  Patient/caregiver understands and will follow disposition?: YesCopied from CRM [id number]. Topic: Clinical - Red Word Triage >> Mar 11, 2024  8:32 AM Kristin [name] wrote: Red Word that prompted transfer to Nurse Triage: Sharp pain when swallowing, trouble swallowing. Extreme gastro issues - unable to get appt - taking acid reflux medication. Reason for Disposition  [1] Symptoms of pill stuck in throat or esophagus (e.g., pain in throat or chest, FB sensation) AND [2] no relief after using Care Advice  Answer Assessment - Initial Assessment Questions 1. DESCRIPTION: "Tell me more about this problem." "Are you  having trouble swallowing liquids, solids, or both?" "Any trouble with swallowing saliva (spit)?"     Sharp pain- feels like food is stuck 2. SEVERITY: "How bad is the swallowing difficulty?"  (e.g., Scale 1-10; or mild, moderate, severe)   - MILD (0-3): Occasional swallowing difficulty; has trouble swallowing certain types of foods or liquids.   - MODERATE (4-7): Frequent swallowing difficulty; only able to swallow small amounts of foods and fluids.   - SEVERE (8-10): Unable to swallow any foods, fluids, or saliva; sensation of "lump in throat" or "something stuck in throat", and frequent drooling or spitting may be present.     moderate 3. ONSET: "When did the swallowing problems begin?"      3 days ago  4. CAUSE: "What do you think is causing the problem?"  (e.g., dry mouth, food or pill stuck in throat, mouth pain, sore throat, progression of disease process such as dementia or Parkinson's disease).      Pt took abx and feels pill is stuck  5.  CHRONIC or RECURRENT: "Is this a new problem for you?"  If No, ask: "How long have you had this problem?" (e.g., days, weeks, months)      Na  6. OTHER SYMPTOMS: "Do you have any other symptoms?" (e.g., chest pain, difficulty breathing, mouth sores, sore throat, swollen tongue, chest pain)     Denies        Pt took abx Friday night and feels she didn't drink enough water  and laid down. Pt feels like esophagus will  spasm. Pt is sipping cold drinks. Hot drinks irritates it. Appetite is down due to pain. Pt denies any SOB. Pt is urinatig without issues.  Protocols used: Swallowing Difficulty-A-AH

## 2024-03-11 NOTE — Telephone Encounter (Signed)
 Noted, appointment canceled.

## 2024-03-11 NOTE — Telephone Encounter (Signed)
 FYI Only or Action Required?: FYI only for provider  Patient was last seen in primary care on 02/05/2024 by Zarwolo, Gloria, FNP. Called Nurse Triage reporting Advice Only (Cancel appt for today at 10:40 with Dr. Zarwolo.   GI provider responded so appt not needed.). Symptoms began today. Interventions attempted: Nothing. Symptoms are: stable.  Triage Disposition: No disposition on file.  Patient/caregiver understands and will follow disposition?:  Pt called in to cancel appt with Dr. Zarwolo for today at 10:40.   I cancelled the appt.   Her GI provider was able to resolve her issue.

## 2024-03-18 ENCOUNTER — Ambulatory Visit: Admitting: Gastroenterology

## 2024-03-20 NOTE — Progress Notes (Deleted)
 No chief complaint on file.   HISTORY OF PRESENT ILLNESS:  03/20/24 ALL:  Kristin [name] is a 43 y.o. female here today for follow up for migraines. She was last seen via Mychart by Dr Tresia Fruit 09/2023 and switched to topiramate  ER 200mg  daily. Zembrace trial for abortive therapy and referral to PT for neck pain placed. Since,    HISTORY (copied from Dr Harding Li previous note)  At last visit topamax  increased. The maxalt  works within an hour. Topamax  helping. She went to sleep with a headache and woke up with a headache. Nurtec cause d a lot of nausea. She had to lay down immediately. She hasn't has a migraine that bad for a while. Mograine 4 days a month, TPX IR working needs more cannot tolerate higher dose. Mazalt helps except with morning or nocturnal or vomitning headaches prescribe emgality, discussed. Has has tremors. She has a large blood panel pending  Patient complains of symptoms per HPI as well as the following symptoms: none . Pertinent negatives and positives per HPI. All others negative     Brief HPI: 42 year old female with a history of GERD who follows in clinic for migraines.   At her last visit, Topamax  was increased to 50 mg BID. She was given Nurtec samples for rescue. Referral to neck PT was sent.     Interval history: 09/11/2023:    Interval History: Headaches have improved since her last visit. She is currently having 1-2 headaches per month. Thinks higher dose of Topamax  is helping. Nurtec made her nauseated so she continued to take Maxalt  for rescue. She recently discovered she has a gluten intolerance, and has noticed that her headaches are worse when she eats gluten.   Missed her PT appointment and is planning to reschedule this.   Migraine days per month: 2 Headache free days per month: 28   Current Headache Regimen: Preventative: Topamax  50 mg BID Abortive: Maxalt  10 mg PRN     Prior Therapies                                  Rescue: Flexeril   10 mg QHS Maxalt  10 mg PRN Imitrex - was onit for some years oral and was on oral and generic nosespray and generic injections 6mg  which was too high  Nurtec - nausea   Prevention: Topamax  50am and 100pm mg BID - cannpt tolerate a higher dose of IR wit;;l switch to ER Elavil 10 mg QHS Cymbalta 30 mg daily Zoloft  50 gm daily - tremor amitriptyimine   REVIEW OF SYSTEMS: Out of a complete 14 system review of symptoms, the patient complains only of the following symptoms, and all other reviewed systems are negative.   ALLERGIES: Allergies  Allergen Reactions   Amoxicillin  Swelling   Nurtec [Rimegepant Sulfate]     Made her sick with vomiting and it only eased my headache a little    Penicillins Swelling   Venlafaxine     Other reaction(s): heart palpitations     HOME MEDICATIONS: Outpatient Medications Prior to Visit  Medication Sig Dispense Refill   azelastine  (ASTELIN ) 0.1 % nasal spray Place 1 spray into both nostrils 2 (two) times daily. Use in each nostril as directed 30 mL 0   doxycycline  (VIBRAMYCIN ) 100 MG capsule Take 1 capsule (100 mg total) by mouth 2 (two) times daily. 20 capsule 0   esomeprazole  (NEXIUM ) 40 MG capsule Take 1  capsule (40 mg total) by mouth 2 (two) times daily before a meal. 60 capsule 3   famotidine  (PEPCID ) 20 MG tablet Take 20 mg by mouth at bedtime. prn (Patient not taking: Reported on 02/05/2024)     ibuprofen  (ADVIL ) 200 MG tablet Take 200 mg by mouth every 6 (six) hours as needed.     levonorgestrel  (MIRENA ) 20 MCG/24HR IUD 1 each by Intrauterine route once.     lidocaine  (XYLOCAINE ) 2 % solution Use as directed 15 mLs in the mouth or throat 2 (two) times daily as needed (throat pain). May take up to twice daily with carafate . 100 mL 0   ofloxacin  (FLOXIN ) 0.3 % OTIC solution Place 5 drops into the left ear twice daily for 7 days 5 mL 0   Plecanatide  (TRULANCE ) 3 MG TABS Take 1 tablet (3 mg total) by mouth daily. 30 tablet 5   rizatriptan  (MAXALT )  10 MG tablet May repeat in 2 hours if needed 9 tablet 11   Semaglutide -Weight Management (WEGOVY ) 1.7 MG/0.75ML SOAJ Inject 1.7 mg into the skin once a week. 3 mL 0   sucralfate  (CARAFATE ) 1 g tablet Take 1 tablet (1 g total) by mouth 4 (four) times daily -  with meals and at bedtime. May dissolve tablet in 1-2 oz and drink as slurry to help coat GI tract. 120 tablet 1   SUMAtriptan  Succinate (ZEMBRACE SYMTOUCH ) 3 MG/0.5ML SOAJ Inject 3 mg into the skin once as needed for up to 1 dose. May repeat in 15 minutes. If symptoms persist, repeat in 2 hours. Max 4 injections daily. (Patient not taking: Reported on 02/05/2024) 3 mL 11   Topiramate  ER (TROKENDI  XR) 200 MG CP24 Take 1 capsule (200 mg total) by mouth at bedtime. PLESE USE COPAY CARD: BIN [id number] PCN [employer] GRP [id number] ID [id number] 30 capsule 11   No facility-administered medications prior to visit.     PAST MEDICAL HISTORY: Past Medical History:  Diagnosis Date   Allergy    Anxiety    Atypical squamous cell changes of undetermined significance (ASCUS) on cervical cytology with negative high risk human papilloma virus (HPV) test result 03/30/2020   6/21 repeat in 3 years ASCCP guidelines 5 year risk CIN 3+ 0.27%   Carpal tunnel syndrome of right wrist 10/22/2018   Contraceptive management 11/05/2013   Depression    Encounter for well woman exam with routine gynecological exam 07/27/2022   GERD (gastroesophageal reflux disease)    IUD (intrauterine device) in place 01/13/2016   Migraines    Missed periods 01/13/2016   Vaginal Pap smear, abnormal      PAST SURGICAL HISTORY: Past Surgical History:  Procedure Laterality Date   COLONOSCOPY N/A 05/10/2019   Normal. next colonoscopy 05/2029   ESOPHAGOGASTRODUODENOSCOPY N/A 05/10/2019   Hiatal hernia   NO PAST SURGERIES       FAMILY HISTORY: Family History  Problem Relation Age of Onset   Multiple myeloma Paternal Grandfather    Cancer Paternal Grandmother        leukemia   Colon  cancer Maternal Grandmother    Hypertension Father    Hyperlipidemia Father    Cancer Father        prostate   Other Mother        enlarged heart   Diabetes Mother    Hypertension Mother    Hyperlipidemia Mother    Heart disease Mother        heart murmer   Miscarriages / [place] Mother  Breast cancer Mother    Other Brother        enlarged heart; colloid cyst of the third ventricle( of the brain)   Hypertension Sister    Colon cancer Maternal Aunt    Gastric cancer Maternal Uncle    Esophageal cancer Maternal Uncle 83   Stomach cancer Maternal Uncle      SOCIAL HISTORY: Social History   Socioeconomic History   Marital status: Divorced    Spouse name: Not on file   Number of children: 3   Years of education: 14   Highest education level: Not on file  Occupational History   Occupation: Public house manager    Comment: [employer]  Tobacco Use   Smoking status: Never    Passive exposure: Never   Smokeless tobacco: Never  Vaping Use   Vaping status: Never Used  Substance and Sexual Activity   Alcohol use: Yes    Comment: occasional   Drug use: Never   Sexual activity: Not Currently    Birth control/protection: I.U.D.  Other Topics Concern   Not on file  Social History Narrative   Lives with parents   Looking for own place   Tries to walk daily   Social Drivers of Health   Financial Resource Strain: Medium Risk (08/03/2023)   Overall Financial Resource Strain (CARDIA)    Difficulty of Paying Living Expenses: Somewhat hard  Food Insecurity: No Food Insecurity (08/03/2023)   Hunger Vital Sign    Worried About Running Out of Food in the Last Year: Never true    Ran Out of Food in the Last Year: Never true  Transportation Needs: No Transportation Needs (08/03/2023)   PRAPARE - Administrator, Civil Service (Medical): No    Lack of Transportation (Non-Medical): No  Physical Activity: Insufficiently Active (08/03/2023)   Exercise Vital Sign     Days of Exercise per Week: 2 days    Minutes of Exercise per Session: 10 min  Stress: No Stress Concern Present (08/03/2023)   [employer] of Occupational Health - Occupational Stress Questionnaire    Feeling of Stress : Only a little  Social Connections: Moderately Isolated (08/03/2023)   Social Connection and Isolation Panel    Frequency of Communication with Friends and Family: More than three times a week    Frequency of Social Gatherings with Friends and Family: More than three times a week    Attends Religious Services: More than 4 times per year    Active Member of [employer] or Organizations: No    Attends Banker Meetings: Never    Marital Status: Divorced  Catering manager Violence: Not At Risk (08/03/2023)   Humiliation, Afraid, Rape, and Kick questionnaire    Fear of Current or Ex-Partner: No    Emotionally Abused: No    Physically Abused: No    Sexually Abused: No     PHYSICAL EXAM  There were no vitals filed for this visit. There is no height or weight on file to calculate BMI.  Generalized: Well developed, in no acute distress  Cardiology: normal rate and rhythm, no murmur auscultated  Respiratory: clear to auscultation bilaterally    Neurological examination  Mentation: Alert oriented to time, place, history taking. Follows all commands speech and language fluent Cranial nerve II-XII: Pupils were equal round reactive to light. Extraocular movements were full, visual field were full on confrontational test. Facial sensation and strength were normal. Uvula tongue midline. Head turning and shoulder shrug  were  normal and symmetric. Motor: The motor testing reveals 5 over 5 strength of all 4 extremities. Good symmetric motor tone is noted throughout.  Sensory: Sensory testing is intact to soft touch on all 4 extremities. No evidence of extinction is noted.  Coordination: Cerebellar testing reveals good finger-nose-finger and heel-to-shin bilaterally.   Gait and station: Gait is normal. Tandem gait is normal. Romberg is negative. No drift is seen.  Reflexes: Deep tendon reflexes are symmetric and normal bilaterally.    DIAGNOSTIC DATA (LABS, IMAGING, TESTING) - I reviewed patient records, labs, notes, testing and imaging myself where available.  Lab Results  Component Value Date   WBC 6.3 02/05/2024   HGB 13.6 02/05/2024   HCT 42.7 02/05/2024   MCV 86 02/05/2024   PLT 324 02/05/2024      Component Value Date/Time   NA 141 02/05/2024 1019   K 4.6 02/05/2024 1019   CL 106 02/05/2024 1019   CO2 19 (L) 02/05/2024 1019   GLUCOSE 85 02/05/2024 1019   GLUCOSE 82 07/28/2023 1039   BUN 7 02/05/2024 1019   CREATININE 0.68 02/05/2024 1019   CREATININE 0.64 07/28/2023 1039   CALCIUM 9.2 02/05/2024 1019   PROT 7.1 02/05/2024 1019   ALBUMIN 4.2 02/05/2024 1019   AST 13 02/05/2024 1019   ALT 11 02/05/2024 1019   ALKPHOS 151 (H) 02/05/2024 1019   BILITOT 1.1 02/05/2024 1019   GFRNONAA >60 12/21/2022 1459   GFRNONAA 108 06/23/2017 1049   GFRAA >60 04/30/2020 1137   GFRAA 125 06/23/2017 1049   Lab Results  Component Value Date   CHOL 150 02/05/2024   HDL 43 02/05/2024   LDLCALC 95 02/05/2024   TRIG 59 02/05/2024   CHOLHDL 3.5 02/05/2024   Lab Results  Component Value Date   HGBA1C 5.1 02/05/2024   Lab Results  Component Value Date   VITAMINB12 264 07/28/2023   Lab Results  Component Value Date   TSH 1.950 02/05/2024        No data to display               No data to display           ASSESSMENT AND PLAN  43 y.o. year old female  has a past medical history of Allergy, Anxiety, Atypical squamous cell changes of undetermined significance (ASCUS) on cervical cytology with negative high risk human papilloma virus (HPV) test result (03/30/2020), Carpal tunnel syndrome of right wrist (10/22/2018), Contraceptive management (11/05/2013), Depression, Encounter for well woman exam with routine gynecological exam  (07/27/2022), GERD (gastroesophageal reflux disease), IUD (intrauterine device) in place (01/13/2016), Migraines, Missed periods (01/13/2016), and Vaginal Pap smear, abnormal. here with    No diagnosis found.  Marga Share ***.  Healthy lifestyle habits encouraged. *** will follow up with PCP as directed. *** will return to see me in ***, sooner if needed. *** verbalizes understanding and agreement with this plan.   No orders of the defined types were placed in this encounter.    No orders of the defined types were placed in this encounter.    Terrilyn Fick, MSN, FNP-C 03/20/2024, 7:56 AM  [facility] [address], [address] [place], [place] [address] [phone]

## 2024-03-20 NOTE — Patient Instructions (Incomplete)

## 2024-03-26 ENCOUNTER — Ambulatory Visit: Payer: BC Managed Care – PPO | Admitting: Family Medicine

## 2024-03-26 DIAGNOSIS — G43009 Migraine without aura, not intractable, without status migrainosus: Secondary | ICD-10-CM

## 2024-04-03 ENCOUNTER — Other Ambulatory Visit: Payer: Self-pay | Admitting: Family Medicine

## 2024-04-04 ENCOUNTER — Other Ambulatory Visit: Payer: Self-pay | Admitting: Family Medicine

## 2024-04-04 ENCOUNTER — Telehealth: Payer: Self-pay

## 2024-04-04 MED ORDER — WEGOVY 2.4 MG/0.75ML ~~LOC~~ SOAJ: 2.4000 mg | SUBCUTANEOUS | 0 refills | Status: DC

## 2024-04-04 NOTE — Telephone Encounter (Signed)
 Copied from CRM [id number]. Topic: General - Other >> Apr 04, 2024  2:18 PM Sophia H wrote: Reason for CRM: Wanting to speak with pcp's medical assistant about her wegovy . Patient has some concerns with updated prescriptions being sent to the pharmacy when she calls in due to pcp not being in office every day, does not want the process to become lengthy or an issue when it comes to filling medication. Please advise # [phone]

## 2024-04-08 ENCOUNTER — Telehealth: Payer: Self-pay | Admitting: Pharmacy Technician

## 2024-04-08 ENCOUNTER — Other Ambulatory Visit (HOSPITAL_COMMUNITY): Payer: Self-pay

## 2024-04-08 ENCOUNTER — Ambulatory Visit: Admitting: Gastroenterology

## 2024-04-08 NOTE — Telephone Encounter (Signed)
 Spoke with patient. Appt scheduled

## 2024-04-08 NOTE — Telephone Encounter (Signed)
 Pharmacy Patient Advocate Encounter   Received notification from CoverMyMeds that prior authorization for Wegovy  2.4MG /0.75ML auto-injectors is required/requested.   Insurance verification completed.   The patient is insured through [facility] .   Per test claim: PA required; PA started via CoverMyMeds. KEY [id number] . Please see clinical question(s) below that I am not finding the answer to in their chart and advise.    Patient's last office visit was on 02/05/2024 and she had only lost 4 pounds. Her insurance is likely to deny reauthorization. Can she be scheduled for an office visit for a weight check?

## 2024-04-09 ENCOUNTER — Ambulatory Visit

## 2024-04-10 ENCOUNTER — Other Ambulatory Visit (HOSPITAL_COMMUNITY): Payer: Self-pay

## 2024-04-11 NOTE — Telephone Encounter (Signed)
 Spoke to patient

## 2024-04-12 ENCOUNTER — Ambulatory Visit (INDEPENDENT_AMBULATORY_CARE_PROVIDER_SITE_OTHER)

## 2024-04-12 NOTE — Progress Notes (Signed)
 Patient is in office today for a nurse visit for  Weight Check .

## 2024-04-15 ENCOUNTER — Other Ambulatory Visit (HOSPITAL_COMMUNITY): Payer: Self-pay

## 2024-04-15 ENCOUNTER — Telehealth: Payer: Self-pay | Admitting: Family Medicine

## 2024-04-15 NOTE — Telephone Encounter (Signed)
 PA request has been Started. New Encounter has been or will be created for follow up. For additional info see Pharmacy Prior Auth telephone encounter from 04/08/2024.  It was sent to her insurance company this morning.

## 2024-04-15 NOTE — Telephone Encounter (Signed)
 Copied from CRM [id number]. Topic: Clinical - Medication Question >> Apr 15, 2024  8:57 AM Jasmin G wrote: Reason for CRM: Prior weight authorization for WEGOVY  was done on Friday, patient stated that her insurance hasn't received anything, would like the MA or nurse to get in contact with her. Please calll back ASAP.

## 2024-04-15 NOTE — Telephone Encounter (Signed)
 Pharmacy Patient Advocate Encounter   Received notification from CoverMyMeds that prior authorization for Wegovy  2.4MG /0.75ML auto-injectors  is required/requested.   Insurance verification completed.   The patient is insured through [facility] .   Per test claim: PA required; PA submitted to above mentioned insurance via LATENT Key/confirmation #/EOC [id number] Status is pending

## 2024-04-17 ENCOUNTER — Telehealth: Payer: Self-pay

## 2024-04-17 ENCOUNTER — Other Ambulatory Visit (HOSPITAL_COMMUNITY): Payer: Self-pay

## 2024-04-17 NOTE — Telephone Encounter (Signed)
 Copied from CRM [id number]. Topic: Clinical - Medication Question >> Apr 15, 2024  8:57 AM Jasmin G wrote: Reason for CRM: Prior weight authorization for WEGOVY  was done on Friday, patient stated that her insurance hasn't received anything, would like the MA or nurse to get in contact with her. Please calll back ASAP. >> Apr 17, 2024 11:34 AM Emylou G wrote: Patient called.. wants to review denial - wants to do appeal?  Pls call her

## 2024-04-17 NOTE — Telephone Encounter (Signed)
 PA request has been Denied. New Encounter has been or will be created for follow up. For additional info see Pharmacy Prior Auth telephone encounter from 04/17/2024.  She did not meet the 5% weight loss requirement set by the plan to be approved for continuation of therapy.

## 2024-04-17 NOTE — Telephone Encounter (Signed)
 Pharmacy Patient Advocate Encounter  Received notification from [facility] that Prior Authorization for Wegovy  2.4mg /0.16ml auto-injectors has been DENIED.  Full denial letter will be uploaded to the media tab. See denial reason below.   PA #/Case ID/Reference #: [id number]

## 2024-04-18 ENCOUNTER — Encounter: Payer: Self-pay | Admitting: Family Medicine

## 2024-04-21 NOTE — Telephone Encounter (Signed)
 Schedule an appt.

## 2024-04-23 NOTE — Telephone Encounter (Signed)
 Scheduled for 08/15.

## 2024-05-06 ENCOUNTER — Ambulatory Visit: Admitting: Gastroenterology

## 2024-05-17 ENCOUNTER — Encounter: Payer: Self-pay | Admitting: Family Medicine

## 2024-05-17 ENCOUNTER — Ambulatory Visit (INDEPENDENT_AMBULATORY_CARE_PROVIDER_SITE_OTHER): Admitting: Family Medicine

## 2024-05-17 DIAGNOSIS — Z6841 Body Mass Index (BMI) 40.0 and over, adult: Secondary | ICD-10-CM | POA: Diagnosis not present

## 2024-05-17 NOTE — Patient Instructions (Addendum)
 I appreciate the opportunity to provide care to you today!    For optimal results with weight loss, I recommend:  Decreasing portion sizes. Reducing sugar, sodium, and carbohydrate intake, and limiting saturated fats in your diet. Increasing your fiber intake by incorporating more whole grains, fruits, and vegetables. Setting healthy goals and focusing on lowering carbs, sugar, and fat. Increasing the variety of fruits and vegetables in your diet. Reducing soda consumption and limiting processed foods. In addition to taking your weight loss medication, engage in moderate-intensity physical activity for at least 150 minutes per week for the best results.    Please follow up if your symptoms worsen or fail to improve.  .   Please continue to a heart-healthy diet and increase your physical activities. Try to exercise for at least five days a week.    It was a pleasure to see you and I look forward to continuing to work together on your health and well-being. Please do not hesitate to call the office if you need care or have questions about your care.  In case of emergency, please visit the Emergency Department for urgent care, or contact our clinic at [phone] to schedule an appointment. We're here to help you!   Have a wonderful day and week. With Gratitude, Lamae Fosco MSN, FNP-BC

## 2024-05-17 NOTE — Progress Notes (Signed)
 Established Patient Office Visit  Subjective:  Patient ID: Kristin [name], female    DOB: July 30, 1981  Age: 43 y.o. MRN: [id number]  CC:  Chief Complaint  Patient presents with   Weight Check    HPI Kristin [name] is a 43 y.o. female with past medical history of obesity presents for f/u.  The patient reports that she has not been on Wegovy  for the past month due to insurance denial, which was based on not achieving the target percentage of weight loss while on therapy. She expresses interest in starting Zepbound  as an alternative medication.   Past Medical History:  Diagnosis Date   Allergy    Anxiety    Atypical squamous cell changes of undetermined significance (ASCUS) on cervical cytology with negative high risk human papilloma virus (HPV) test result 03/30/2020   6/21 repeat in 3 years ASCCP guidelines 5 year risk CIN 3+ 0.27%   Carpal tunnel syndrome of right wrist 10/22/2018   Contraceptive management 11/05/2013   Depression    Encounter for well woman exam with routine gynecological exam 07/27/2022   GERD (gastroesophageal reflux disease)    IUD (intrauterine device) in place 01/13/2016   Migraines    Missed periods 01/13/2016   Vaginal Pap smear, abnormal     Past Surgical History:  Procedure Laterality Date   COLONOSCOPY N/A 05/10/2019   Normal. next colonoscopy 05/2029   ESOPHAGOGASTRODUODENOSCOPY N/A 05/10/2019   Hiatal hernia   NO PAST SURGERIES      Family History  Problem Relation Age of Onset   Multiple myeloma Paternal Grandfather    Cancer Paternal Grandmother        leukemia   Colon cancer Maternal Grandmother    Hypertension Father    Hyperlipidemia Father    Cancer Father        prostate   Other Mother        enlarged heart   Diabetes Mother    Hypertension Mother    Hyperlipidemia Mother    Heart disease Mother        heart murmer   Miscarriages / Stillbirths Mother    Breast cancer Mother    Other Brother        enlarged heart;  colloid cyst of the third ventricle( of the brain)   Hypertension Sister    Colon cancer Maternal Aunt    Gastric cancer Maternal Uncle    Esophageal cancer Maternal Uncle 36   Stomach cancer Maternal Uncle     Social History   Socioeconomic History   Marital status: Divorced    Spouse name: Not on file   Number of children: 3   Years of education: 14   Highest education level: Not on file  Occupational History   Occupation: Public house manager    Comment: [employer]  Tobacco Use   Smoking status: Never    Passive exposure: Never   Smokeless tobacco: Never  Vaping Use   Vaping status: Never Used  Substance and Sexual Activity   Alcohol use: Yes    Comment: occasional   Drug use: Never   Sexual activity: Not Currently    Birth control/protection: I.U.D.  Other Topics Concern   Not on file  Social History Narrative   Lives with parents   Looking for own place   Tries to walk daily   Social Drivers of Health   Financial Resource Strain: Medium Risk (08/03/2023)   Overall Financial Resource Strain (CARDIA)    Difficulty of Paying Living Expenses:  Somewhat hard  Food Insecurity: No Food Insecurity (08/03/2023)   Hunger Vital Sign    Worried About Running Out of Food in the Last Year: Never true    Ran Out of Food in the Last Year: Never true  Transportation Needs: No Transportation Needs (08/03/2023)   PRAPARE - Administrator, Civil Service (Medical): No    Lack of Transportation (Non-Medical): No  Physical Activity: Insufficiently Active (08/03/2023)   Exercise Vital Sign    Days of Exercise per Week: 2 days    Minutes of Exercise per Session: 10 min  Stress: No Stress Concern Present (08/03/2023)   [employer] of Occupational Health - Occupational Stress Questionnaire    Feeling of Stress : Only a little  Social Connections: Moderately Isolated (08/03/2023)   Social Connection and Isolation Panel    Frequency of Communication with Friends and  Family: More than three times a week    Frequency of Social Gatherings with Friends and Family: More than three times a week    Attends Religious Services: More than 4 times per year    Active Member of [employer] or Organizations: No    Attends Banker Meetings: Never    Marital Status: Divorced  Catering manager Violence: Not At Risk (08/03/2023)   Humiliation, Afraid, Rape, and Kick questionnaire    Fear of Current or Ex-Partner: No    Emotionally Abused: No    Physically Abused: No    Sexually Abused: No    Outpatient Medications Prior to Visit  Medication Sig Dispense Refill   esomeprazole  (NEXIUM ) 40 MG capsule Take 1 capsule (40 mg total) by mouth 2 (two) times daily before a meal. 60 capsule 3   levonorgestrel  (MIRENA ) 20 MCG/24HR IUD 1 each by Intrauterine route once.     lidocaine  (XYLOCAINE ) 2 % solution Use as directed 15 mLs in the mouth or throat 2 (two) times daily as needed (throat pain). May take up to twice daily with carafate . 100 mL 0   Plecanatide  (TRULANCE ) 3 MG TABS Take 1 tablet (3 mg total) by mouth daily. 30 tablet 5   rizatriptan  (MAXALT ) 10 MG tablet May repeat in 2 hours if needed 9 tablet 11   SUMAtriptan  Succinate (ZEMBRACE SYMTOUCH ) 3 MG/0.5ML SOAJ Inject 3 mg into the skin once as needed for up to 1 dose. May repeat in 15 minutes. If symptoms persist, repeat in 2 hours. Max 4 injections daily. 3 mL 11   Topiramate  ER (TROKENDI  XR) 200 MG CP24 Take 1 capsule (200 mg total) by mouth at bedtime. PLESE USE COPAY CARD: BIN [id number] PCN Loyalty GRP [id number] ID [id number] 30 capsule 11   ibuprofen  (ADVIL ) 200 MG tablet Take 200 mg by mouth every 6 (six) hours as needed.     azelastine  (ASTELIN ) 0.1 % nasal spray Place 1 spray into both nostrils 2 (two) times daily. Use in each nostril as directed (Patient not taking: Reported on 05/17/2024) 30 mL 0   famotidine  (PEPCID ) 20 MG tablet Take 20 mg by mouth at bedtime. prn (Patient not taking: Reported on 05/17/2024)      Semaglutide -Weight Management (WEGOVY ) 1.7 MG/0.75ML SOAJ Inject 1.7 mg into the skin once a week. (Patient not taking: Reported on 05/17/2024) 3 mL 0   Semaglutide -Weight Management (WEGOVY ) 2.4 MG/0.75ML SOAJ Inject 2.4 mg into the skin once a week. (Patient not taking: Reported on 05/17/2024) 3 mL 0   sucralfate  (CARAFATE ) 1 g tablet Take 1 tablet (1 g total) by  mouth 4 (four) times daily -  with meals and at bedtime. May dissolve tablet in 1-2 oz and drink as slurry to help coat GI tract. (Patient not taking: Reported on 05/17/2024) 120 tablet 1   doxycycline  (VIBRAMYCIN ) 100 MG capsule Take 1 capsule (100 mg total) by mouth 2 (two) times daily. 20 capsule 0   ofloxacin  (FLOXIN ) 0.3 % OTIC solution Place 5 drops into the left ear twice daily for 7 days 5 mL 0   No facility-administered medications prior to visit.    Allergies  Allergen Reactions   Amoxicillin  Swelling   Nurtec [Rimegepant Sulfate]     Made her sick with vomiting and it only eased my headache a little    Penicillins Swelling   Venlafaxine     Other reaction(s): heart palpitations    ROS Review of Systems  Constitutional:  Negative for chills and fever.  Eyes:  Negative for visual disturbance.  Respiratory:  Negative for chest tightness and shortness of breath.   Neurological:  Negative for dizziness and headaches.      Objective:    Physical Exam Constitutional:      Appearance: She is obese.  HENT:     Head: Normocephalic.     Mouth/Throat:     Mouth: Mucous membranes are moist.  Cardiovascular:     Rate and Rhythm: Normal rate.     Heart sounds: Normal heart sounds.  Pulmonary:     Effort: Pulmonary effort is normal.     Breath sounds: Normal breath sounds.  Neurological:     Mental Status: She is alert.     BP 119/73   Pulse 72   Resp 16   Ht 5' 4 (1.626 m)   Wt 258 lb 12.8 oz (117.4 kg)   SpO2 98%   BMI 44.42 kg/m  Wt Readings from Last 3 Encounters:  05/17/24 258 lb 12.8 oz (117.4  kg)  04/12/24 255 lb (115.7 kg)  02/05/24 263 lb 1.3 oz (119.3 kg)    Lab Results  Component Value Date   TSH 1.950 02/05/2024   Lab Results  Component Value Date   WBC 6.3 02/05/2024   HGB 13.6 02/05/2024   HCT 42.7 02/05/2024   MCV 86 02/05/2024   PLT 324 02/05/2024   Lab Results  Component Value Date   NA 141 02/05/2024   K 4.6 02/05/2024   CO2 19 (L) 02/05/2024   GLUCOSE 85 02/05/2024   BUN 7 02/05/2024   CREATININE 0.68 02/05/2024   BILITOT 1.1 02/05/2024   ALKPHOS 151 (H) 02/05/2024   AST 13 02/05/2024   ALT 11 02/05/2024   PROT 7.1 02/05/2024   ALBUMIN 4.2 02/05/2024   CALCIUM 9.2 02/05/2024   ANIONGAP 5 12/21/2022   EGFR 109 02/05/2024   Lab Results  Component Value Date   CHOL 150 02/05/2024   Lab Results  Component Value Date   HDL 43 02/05/2024   Lab Results  Component Value Date   LDLCALC 95 02/05/2024   Lab Results  Component Value Date   TRIG 59 02/05/2024   Lab Results  Component Value Date   CHOLHDL 3.5 02/05/2024   Lab Results  Component Value Date   HGBA1C 5.1 02/05/2024      Assessment & Plan:  Morbid obesity (HCC) Assessment & Plan:  informed the patient that it is less likely her insurance will cover Zepbound  under similar criteria, but she may contact her insurance provider directly to verify coverage prior to initiating treatment.  In the interim, I recommended continued lifestyle modifications, including adherence to a heart-healthy diet and increased physical activity. Wt Readings from Last 3 Encounters:  05/17/24 258 lb 12.8 oz (117.4 kg)  04/12/24 255 lb (115.7 kg)  02/05/24 263 lb 1.3 oz (119.3 kg)      Note: This chart has been completed using Engineer, civil (consulting) software, and while attempts have been made to ensure accuracy, certain words and phrases may not be transcribed as intended.    Follow-up: No follow-ups on file.   Promyse Ardito, FNP

## 2024-05-17 NOTE — Assessment & Plan Note (Signed)
 informed the patient that it is less likely her insurance will cover Zepbound  under similar criteria, but she may contact her insurance provider directly to verify coverage prior to initiating treatment. In the interim, I recommended continued lifestyle modifications, including adherence to a heart-healthy diet and increased physical activity. Wt Readings from Last 3 Encounters:  05/17/24 258 lb 12.8 oz (117.4 kg)  04/12/24 255 lb (115.7 kg)  02/05/24 263 lb 1.3 oz (119.3 kg)

## 2024-05-21 ENCOUNTER — Other Ambulatory Visit: Payer: Self-pay | Admitting: Family Medicine

## 2024-05-21 MED ORDER — TIRZEPATIDE-WEIGHT MANAGEMENT 2.5 MG/0.5ML ~~LOC~~ SOLN: 2.5000 mg | SUBCUTANEOUS | 0 refills | Status: DC

## 2024-05-22 ENCOUNTER — Other Ambulatory Visit (HOSPITAL_COMMUNITY): Payer: Self-pay

## 2024-05-22 ENCOUNTER — Telehealth: Payer: Self-pay | Admitting: Pharmacy Technician

## 2024-05-22 NOTE — Telephone Encounter (Signed)
 Pharmacy Patient Advocate Encounter   Received notification from CoverMyMeds that prior authorization for Zepbound  2.5mg /0.50ml auto-injectors is required/requested.   Insurance verification completed.   The patient is insured through [facility] .   Per test claim: PA required; PA submitted to above mentioned insurance via Latent Key/confirmation #/EOC [id number] Status is pending

## 2024-05-22 NOTE — Telephone Encounter (Signed)
 PA request has been Started. New Encounter has been or will be created for follow up. For additional info see Pharmacy Prior Auth telephone encounter from 05/22/2024.

## 2024-05-23 ENCOUNTER — Other Ambulatory Visit (HOSPITAL_COMMUNITY): Payer: Self-pay

## 2024-05-23 NOTE — Telephone Encounter (Signed)
 Pharmacy Patient Advocate Encounter  Received notification from [facility] that Prior Authorization for Zepbound  2.5MG /0.5ML pen-injectors has been APPROVED from 05/23/2024 to 11/18/2024. Ran test claim, Copay is $25.00. This test claim was processed through [facility]- copay amounts may vary at other pharmacies due to pharmacy/plan contracts, or as the patient moves through the different stages of their insurance plan.   PA #/Case ID/Reference #: [id number]

## 2024-05-24 ENCOUNTER — Encounter: Payer: Self-pay | Admitting: Radiology

## 2024-06-10 ENCOUNTER — Other Ambulatory Visit: Payer: Self-pay | Admitting: Family Medicine

## 2024-06-10 MED ORDER — TIRZEPATIDE-WEIGHT MANAGEMENT 5 MG/0.5ML ~~LOC~~ SOLN: 5.0000 mg | SUBCUTANEOUS | 0 refills | Status: DC

## 2024-06-16 NOTE — Progress Notes (Signed)
 GI Office Note    Referring Provider: Zarwolo, Gloria, FNP Primary Care Physician:  Zarwolo, Gloria, FNP Primary Gastroenterologist: Lamar HERO.Rourk, MD   Date:  06/17/2024  ID:  Kristin [name], DOB 1980/12/16, MRN [id number]   Chief Complaint   Chief Complaint  Patient presents with   Follow-up    Follow up. Having some pains on left lower side    History of Present Illness  Kristin [name] is a 43 y.o. female with a history of anxiety, constipation, celiac, GERD, and migraines presenting today with complaint of LLQ pain.   EGD August 2020: - Normal esophagus. - Hiatal hernia. - The examination was otherwise normal. - Normal duodenal bulb and second portion of the duodenum.   Colonoscopy August 2020: - The entire examined colon is normal. - The distal rectum and anal verge are normal on retroflexion view. - No specimens collected. Entire colon seen well. Of note, patient's bowel symptoms have improved on daily Benefiber   Office visit 11/21/22. Unable to get Linzess  covered. Trulance  too effective - taking every other day. On PPI BID. Trying to limit spicy foods and eating late. Has breakthrough once weekly with nausea and belching. Not using pepcid . Using tums. Reportedly feeling terrible since having COVID and stopping paxlovid . Having rectal fullness that comes and goes, possible hemorrhoids. No brbpr or itching. Advised to continue trulance  and PPI BID. Use pepcid  as needed. Anusol  BID for 1 week. Follow GERD diet.    On 12/07/22: Patient reported stomach issues, bloating, gas, nausea, decreased appetite.  Feel like fireworks going off in her stomach at times when she gets up in the morning.  Also very fatigued and no energy.  Left-sided pain that radiates into her back.  Also reported liquidy yellow stools.  Stopped using Trulance .  Sent in Amitiza  for her to use for constipation.   OV 01/31/2023.  Had looser stools and without Amitiza  was also having some intermittent  harder stools.  Had been trying to stay on fiber and drinking more water .  Went back to taking Trulance .  Not having any cramping abdominal pain but did have some pain in the left upper quadrant described as pinching.  Also feeling shaky and tired all the time with some muscle weakness but overall feeling terrible.  Reflux controlled for the most part but at times feel like someone squeezing her throat.  I cut back on spicy foods and not eating any fried foods.  Denied any dysphagia.  Some nausea but no vomiting.  Has been trying to exercise to help with gas movement and is avoiding dairy products is much as able but also taking turmeric which she thought was helping her symptoms some. Check CBC, celiac labs, TSH, B12, folate, iron panel, PTH. CT ordered. Low  FODMAP diet recommended. Gas ex as needed. Start nexium  in place of pantoprazole .   Labs 01/31/23: Celiac panel with positive Gliadin IgA, normal IgA, negative Ttg IgA. Thyroid  normal, parathyroid normal. Anemia panel, B12, folate normal. Advised to follow gluten free diet to see if symptoms improve.    CT A/P 02/04/23: -No acute findings -IUD in adequate position.  Last office visit 04/11/23.  GERD symptoms improved with taking medication twice daily.  Stop turmeric.  Still not eating completely gluten-free but have been trying as many options as possible.  Does feel a difference when she avoids it.  Having headaches.  Not waking up jittery or feeling terrible as much.  Also improvement of nausea and fatigue.  Had been  having less diarrhea and not really struggling with constipation and was doing well with Trulance . Continue gluten free diet. Trulance  3 mg daily. Conitnue nexium  BID with plan to wean as able.   Reported worsening reflux in June 2025 - gave carafate  and viscous lidocaine  and advised to schedule an appointment. Advised this as needed in addition to PPI and famotidine .   Today:  Discussed the use of AI scribe software for clinical note  transcription with the patient, who gave verbal consent to proceed.  She has been experiencing left upper quadrant abdominal pain for approximately two weeks, similar to previous episodes. She reports that the pain possibly occurs after gluten exposure. She reports that when lying down at night she will experience shakiness, jitteriness, and sometimes a racing heart after gluten exposure.  She is currently on Zepbound  for weight management, having recently taken her fourth shot. Previously, she used Wegovy  from February to May but discontinued due to insurance issues, never reaching the upper dose. Since starting Zepbound , she has noticed prolonged bowel movements and querried about starting a stool softener. Her bowel habits include one to two movements per day, and she continues to take Trulance , preferring evening doses due to its delayed effect. She previously used Linzess , which she preferred, but it is not covered by her insurance.  She has a history of reflux, well-controlled with Nexium  twice daily. She experienced severe esophageal pain, which she believes may have been related to taking an antibiotic without sufficient water  and lying down immediately after. This pain was managed with lidocaine  and Carafate  for three weeks. This treatment was effective, and she no longer experiences pain when swallowing.  She follows a gluten-free diet due to celiac disease but occasionally consumes gluten-containing foods, such as rice, which exacerbates her symptoms. Symptoms like jitteriness and a racing heart occur after gluten exposure, particularly noticeable when lying down at night.  She recalls a previous blood test indicating slightly elevated liver enzymes and mentioned that fatty liver had been discussed as a possibility. She is aware of the importance of protein intake, especially while on Zepbound , and is considering further evaluation of her liver health.      Wt Readings from Last 5 Encounters:   06/17/24 263 lb 3.2 oz (119.4 kg)  05/17/24 258 lb 12.8 oz (117.4 kg)  04/12/24 255 lb (115.7 kg)  02/05/24 263 lb 1.3 oz (119.3 kg)  12/28/23 266 lb (120.7 kg)    Current Outpatient Medications  Medication Sig Dispense Refill   esomeprazole  (NEXIUM ) 40 MG capsule Take 1 capsule (40 mg total) by mouth 2 (two) times daily before a meal. 60 capsule 3   levonorgestrel  (MIRENA ) 20 MCG/24HR IUD 1 each by Intrauterine route once.     lidocaine  (XYLOCAINE ) 2 % solution Use as directed 15 mLs in the mouth or throat 2 (two) times daily as needed (throat pain). May take up to twice daily with carafate . 100 mL 0   Plecanatide  (TRULANCE ) 3 MG TABS Take 1 tablet (3 mg total) by mouth daily. 30 tablet 5   rizatriptan  (MAXALT ) 10 MG tablet May repeat in 2 hours if needed 9 tablet 11   SUMAtriptan  Succinate (ZEMBRACE SYMTOUCH ) 3 MG/0.5ML SOAJ Inject 3 mg into the skin once as needed for up to 1 dose. May repeat in 15 minutes. If symptoms persist, repeat in 2 hours. Max 4 injections daily. 3 mL 11   tirzepatide  (ZEPBOUND ) 2.5 MG/0.5ML injection vial Inject 2.5 mg into the skin once a week. 2  mL 0   tirzepatide  5 MG/0.5ML injection vial Inject 5 mg into the skin once a week. 2 mL 0   Topiramate  ER (TROKENDI  XR) 200 MG CP24 Take 1 capsule (200 mg total) by mouth at bedtime. PLESE USE COPAY CARD: BIN [id number] PCN [employer] GRP [id number] ID [id number] 30 capsule 11   azelastine  (ASTELIN ) 0.1 % nasal spray Place 1 spray into both nostrils 2 (two) times daily. Use in each nostril as directed (Patient not taking: Reported on 06/17/2024) 30 mL 0   famotidine  (PEPCID ) 20 MG tablet Take 20 mg by mouth at bedtime. prn (Patient not taking: Reported on 06/17/2024)     sucralfate  (CARAFATE ) 1 g tablet Take 1 tablet (1 g total) by mouth 4 (four) times daily -  with meals and at bedtime. May dissolve tablet in 1-2 oz and drink as slurry to help coat GI tract. (Patient not taking: Reported on 06/17/2024) 120 tablet 1   No current  facility-administered medications for this visit.    Past Medical History:  Diagnosis Date   Allergy    Anxiety    Atypical squamous cell changes of undetermined significance (ASCUS) on cervical cytology with negative high risk human papilloma virus (HPV) test result 03/30/2020   6/21 repeat in 3 years ASCCP guidelines 5 year risk CIN 3+ 0.27%   Carpal tunnel syndrome of right wrist 10/22/2018   Contraceptive management 11/05/2013   Depression    Encounter for well woman exam with routine gynecological exam 07/27/2022   GERD (gastroesophageal reflux disease)    IUD (intrauterine device) in place 01/13/2016   Migraines    Missed periods 01/13/2016   Vaginal Pap smear, abnormal     Past Surgical History:  Procedure Laterality Date   COLONOSCOPY N/A 05/10/2019   Normal. next colonoscopy 05/2029   ESOPHAGOGASTRODUODENOSCOPY N/A 05/10/2019   Hiatal hernia   NO PAST SURGERIES      Family History  Problem Relation Age of Onset   Multiple myeloma Paternal Grandfather    Cancer Paternal Grandmother        leukemia   Colon cancer Maternal Grandmother    Hypertension Father    Hyperlipidemia Father    Cancer Father        prostate   Other Mother        enlarged heart   Diabetes Mother    Hypertension Mother    Hyperlipidemia Mother    Heart disease Mother        heart murmer   Miscarriages / Stillbirths Mother    Breast cancer Mother    Other Brother        enlarged heart; colloid cyst of the third ventricle( of the brain)   Hypertension Sister    Colon cancer Maternal Aunt    Gastric cancer Maternal Uncle    Esophageal cancer Maternal Uncle 58   Stomach cancer Maternal Uncle     Allergies as of 06/17/2024 - Review Complete 06/17/2024  Allergen Reaction Noted   Amoxicillin  Swelling 08/04/2020   Nurtec [rimegepant sulfate]  10/27/2022   Penicillins Swelling 05/03/2020   Venlafaxine  08/24/2020    Social History   Socioeconomic History   Marital status: Divorced     Spouse name: Not on file   Number of children: 3   Years of education: 14   Highest education level: Not on file  Occupational History   Occupation: Public house manager    Comment: [employer]  Tobacco Use   Smoking status: Never  Passive exposure: Never   Smokeless tobacco: Never  Vaping Use   Vaping status: Never Used  Substance and Sexual Activity   Alcohol use: Yes    Comment: occasional   Drug use: Never   Sexual activity: Not Currently    Birth control/protection: I.U.D.  Other Topics Concern   Not on file  Social History Narrative   Lives with parents   Looking for own place   Tries to walk daily   Social Drivers of Health   Financial Resource Strain: Medium Risk (08/03/2023)   Overall Financial Resource Strain (CARDIA)    Difficulty of Paying Living Expenses: Somewhat hard  Food Insecurity: No Food Insecurity (08/03/2023)   Hunger Vital Sign    Worried About Running Out of Food in the Last Year: Never true    Ran Out of Food in the Last Year: Never true  Transportation Needs: No Transportation Needs (08/03/2023)   PRAPARE - Administrator, Civil Service (Medical): No    Lack of Transportation (Non-Medical): No  Physical Activity: Insufficiently Active (08/03/2023)   Exercise Vital Sign    Days of Exercise per Week: 2 days    Minutes of Exercise per Session: 10 min  Stress: No Stress Concern Present (08/03/2023)   [employer] of Occupational Health - Occupational Stress Questionnaire    Feeling of Stress : Only a little  Social Connections: Moderately Isolated (08/03/2023)   Social Connection and Isolation Panel    Frequency of Communication with Friends and Family: More than three times a week    Frequency of Social Gatherings with Friends and Family: More than three times a week    Attends Religious Services: More than 4 times per year    Active Member of [employer] or Organizations: No    Attends Banker Meetings: Never     Marital Status: Divorced     Review of Systems   Gen: Denies fever, chills, anorexia. Denies fatigue, weakness, weight loss.  CV: Denies chest pain, palpitations, syncope, peripheral edema, and claudication. Resp: Denies dyspnea at rest, cough, wheezing, coughing up blood, and pleurisy. GI: See HPI Derm: Denies rash, itching, dry skin Psych: Denies depression, anxiety, memory loss, confusion. No homicidal or suicidal ideation.  Heme: Denies bruising, bleeding, and enlarged lymph nodes.  Physical Exam   BP 134/81 (BP Location: Right Arm, Patient Position: Sitting)   Pulse 66   Temp 97.9 F (36.6 C) (Temporal)   Ht 5' 4 (1.626 m)   Wt 263 lb 3.2 oz (119.4 kg)   BMI 45.18 kg/m   General:   Alert and oriented. No distress noted. Pleasant and cooperative.  Head:  Normocephalic and atraumatic. Eyes:  Conjuctiva clear without scleral icterus. Mouth:  Oral mucosa pink and moist. Good dentition. No lesions. Abdomen:  +BS, soft, non-tender and non-distended. No rebound or guarding. No HSM or masses noted. Rectal: deferred Msk:  Symmetrical without gross deformities. Normal posture. Extremities:  Without edema. Neurologic:  Alert and  oriented x4 Psych:  Alert and cooperative. Normal mood and affect.  Assessment  Georgi Tuel is a 43 y.o. female presenting today with intermittent LUQ pain and to discuss celiac.      Left upper quadrant abdominal pain Intermittent pain possibly related to gluten exposure due to celiac disease or medication effects from Zepbound . - Monitor symptoms and consider correlation with gluten exposure. - Continue to work towards gluten free diet.   Celiac disease with ongoing gluten exposure Ongoing gluten exposure with symptoms  of jitteriness and heart racing. Difficulty maintaining a strict gluten-free diet due to limited options, especially when eating out. - Refer to nutritionist for consultation on gluten-free diet and resources. - Encourage  adherence to gluten-free diet to prevent symptoms.  Chronic constipation with recent worsening secondary to zepbound  Chronic constipation worsened since starting Zepbound . Bowel movements are harder and drier than usual however going consistently daily. Trulance  is effective but requires timing adjustments due to work schedule. - Start Colace 50 mg, increase to 100 mg if needed. - Continue Trulance  with current regimen. - Monitor bowel habits and adjust stool softener as needed. - Adequate water  intake.   Gastroesophageal reflux disease, controlled GERD symptoms well-controlled with Nexium . Previous exacerbation managed with lidocaine  and Carafate . - Continue Nexium  40 mg twice daily.  Elevated alkaline phosphatase, under evaluation for suspected fatty liver disease Slight elevation in alkaline phosphatase, possibly related to fatty liver disease. Liver enzymes were normal in May, but alkaline phosphatase was slightly elevated at 151 and 147 previously. Will check isoenzymes and GGT to rule in/out liver source.  - Order fasting labs to recheck alkaline phosphatase and liver enzymes. - Will do liver fibrosis testing as well - Consider RUQ US  pending lab results.      PLAN   Follow up 6 months.     Charmaine Melia, MSN, FNP-BC, AGACNP-BC [facility]

## 2024-06-17 ENCOUNTER — Ambulatory Visit (INDEPENDENT_AMBULATORY_CARE_PROVIDER_SITE_OTHER): Admitting: Gastroenterology

## 2024-06-17 ENCOUNTER — Other Ambulatory Visit: Payer: Self-pay | Admitting: *Deleted

## 2024-06-17 ENCOUNTER — Encounter: Payer: Self-pay | Admitting: Gastroenterology

## 2024-06-17 VITALS — BP 134/81 | HR 66 | Temp 97.9°F | Ht 64.0 in | Wt 263.2 lb

## 2024-06-17 DIAGNOSIS — R1012 Left upper quadrant pain: Secondary | ICD-10-CM

## 2024-06-17 DIAGNOSIS — R634 Abnormal weight loss: Secondary | ICD-10-CM

## 2024-06-17 DIAGNOSIS — K900 Celiac disease: Secondary | ICD-10-CM | POA: Diagnosis not present

## 2024-06-17 DIAGNOSIS — R1032 Left lower quadrant pain: Secondary | ICD-10-CM

## 2024-06-17 DIAGNOSIS — R748 Abnormal levels of other serum enzymes: Secondary | ICD-10-CM

## 2024-06-17 DIAGNOSIS — K219 Gastro-esophageal reflux disease without esophagitis: Secondary | ICD-10-CM | POA: Diagnosis not present

## 2024-06-17 DIAGNOSIS — K5900 Constipation, unspecified: Secondary | ICD-10-CM

## 2024-06-17 DIAGNOSIS — K9041 Non-celiac gluten sensitivity: Secondary | ICD-10-CM

## 2024-06-17 MED ORDER — TRULANCE 3 MG PO TABS: 3.0000 mg | ORAL_TABLET | Freq: Every day | ORAL | 3 refills | Status: DC

## 2024-06-17 MED ORDER — ESOMEPRAZOLE MAGNESIUM 40 MG PO CPDR: 40.0000 mg | DELAYED_RELEASE_CAPSULE | Freq: Two times a day (BID) | ORAL | 5 refills | Status: AC

## 2024-06-17 NOTE — Patient Instructions (Addendum)
 I have ordered blood work for you have completed at [facility].  Please let me know if there is any issue with getting labs drawn.  If it needs to be switched to [facility] does not know we can change orders.  Is important that you are fasting for these labs.  Please continue to follow a gluten-free diet as best as possible.  I am sending in a nutrition consult for you to discuss options in regards to gluten-free diet and to help with weight loss.  Continue taking Trulance  3 mg daily.  You may add a stool softener daily, Colace or store generic is fine (main ingredient is docusate sodium ).  You may start out with 50 mg nightly or increase to 100 mg if needed.  Continue Nexium  40 mg twice daily.  I sent in refills of Trulance  and Nexium  for you today.  Follow-up in 6 months, sooner if needed.  I will be in touch with you in regards to lab results once received.  It was a pleasure to see you today. I want to create trusting relationships with patients. If you receive a survey regarding your visit,  I greatly appreciate you taking time to fill this out on paper or through your MyChart. I value your feedback.  Charmaine Melia, MSN, FNP-BC, AGACNP-BC [facility]

## 2024-06-21 ENCOUNTER — Ambulatory Visit: Payer: Self-pay | Admitting: Gastroenterology

## 2024-06-21 LAB — COMPREHENSIVE METABOLIC PANEL WITH GFR
AG Ratio: 1.7 (calc) (ref 1.0–2.5)
ALT: 9 U/L (ref 6–29)
AST: 12 U/L (ref 10–35)
Albumin: 4.2 g/dL (ref 3.6–5.1)
Alkaline phosphatase (APISO): 105 U/L (ref 31–125)
BUN: 11 mg/dL (ref 7–25)
CO2: 23 mmol/L (ref 20–32)
Calcium: 8.9 mg/dL (ref 8.6–10.2)
Chloride: 107 mmol/L (ref 98–110)
Creat: 0.76 mg/dL (ref 0.50–0.99)
Globulin: 2.5 g/dL (ref 1.9–3.7)
Glucose, Bld: 77 mg/dL (ref 65–99)
Potassium: 3.7 mmol/L (ref 3.5–5.3)
Sodium: 137 mmol/L (ref 135–146)
Total Bilirubin: 0.6 mg/dL (ref 0.2–1.2)
Total Protein: 6.7 g/dL (ref 6.1–8.1)
eGFR: 98 mL/min/1.73m2 (ref >=60)

## 2024-06-21 LAB — GAMMA GT: GGT: 8 U/L (ref 3–55)

## 2024-06-26 LAB — ALKALINE PHOSPHATASE ISOENZYMES
Alkaline phosphatase (APISO): 95 U/L (ref 31–125)
Bone Isoenzymes: 26 % — ABNORMAL LOW (ref 28–66)
Intestinal Isoenzymes: 0 % — ABNORMAL LOW (ref 1–24)
Liver Isoenzymes: 74 % — ABNORMAL HIGH (ref 25–69)
Macrohepatic isoenzymes: 0 % (ref <=0)
Placental isoenzymes: 0 % (ref <=0)

## 2024-06-28 LAB — LIVER FIBROSIS, FIBROTEST-ACTITEST
ALT: 10 U/L (ref 6–29)
Alpha-2-Macroglobulin: 87 mg/dL — ABNORMAL LOW (ref 106–279)
Apolipoprotein A1: 121 mg/dL (ref 101–198)
Bilirubin: 0.5 mg/dL (ref 0.2–1.2)
Fibrosis Score: 0.02
GGT: 9 U/L (ref 3–55)
Haptoglobin: 175 mg/dL (ref 43–212)
Necroinflammat ACT Score: 0.02
Reference ID: 5706785

## 2024-07-03 ENCOUNTER — Encounter: Payer: Self-pay | Admitting: *Deleted

## 2024-07-08 ENCOUNTER — Ambulatory Visit: Admitting: Family Medicine

## 2024-07-09 ENCOUNTER — Telehealth: Payer: Self-pay | Admitting: *Deleted

## 2024-07-09 NOTE — Telephone Encounter (Signed)
 Called and left voicemail for patient to reschedule appointment on 12/10 with Dr Ines.  If patient calls back, they can be rescheduled with Dr Gregg

## 2024-07-09 NOTE — Telephone Encounter (Signed)
 Received Trokendi  PA to complete. Pt's next appt with Dr Ines is in December. Patient needs reassignment to new provider.

## 2024-07-12 ENCOUNTER — Other Ambulatory Visit: Payer: Self-pay | Admitting: Family Medicine

## 2024-07-12 ENCOUNTER — Other Ambulatory Visit (HOSPITAL_COMMUNITY): Payer: Self-pay

## 2024-07-12 ENCOUNTER — Telehealth: Payer: Self-pay

## 2024-07-12 NOTE — Telephone Encounter (Signed)
 Pharmacy Patient Advocate Encounter   Received notification from CoverMyMeds that prior authorization for TOPIRAMATE  ER 200MG  CAPSULES is required/requested.   Insurance verification completed.   The patient is insured through [facility].   Per test claim: PA required; PA started via CoverMyMeds. KEY [id number] . Waiting for clinical questions to populate.

## 2024-07-15 ENCOUNTER — Ambulatory Visit: Admitting: Nutrition

## 2024-07-17 ENCOUNTER — Telehealth: Payer: Self-pay

## 2024-07-17 ENCOUNTER — Other Ambulatory Visit (HOSPITAL_COMMUNITY): Payer: Self-pay

## 2024-07-17 NOTE — Telephone Encounter (Signed)
 Copied from CRM #[id number]. Topic: Clinical - Prescription Issue >> Jul 17, 2024  8:28 AM Avram MATSU wrote: Reason for CRM: patient is calling about her medication ZEPBOUND  5 MG/0.5ML Pen [Pharmacy Med Name: Zepbound  5 mg/0.5 mL subcutaneous pen injector] [[id number]] she is due for her shot on Friday. Please advise [phone]

## 2024-07-18 ENCOUNTER — Other Ambulatory Visit: Payer: Self-pay | Admitting: Gastroenterology

## 2024-07-18 ENCOUNTER — Other Ambulatory Visit: Payer: Self-pay | Admitting: Family Medicine

## 2024-07-18 ENCOUNTER — Encounter: Payer: Self-pay | Admitting: Family Medicine

## 2024-07-18 DIAGNOSIS — R945 Abnormal results of liver function studies: Secondary | ICD-10-CM | POA: Diagnosis not present

## 2024-07-18 MED ORDER — TIRZEPATIDE-WEIGHT MANAGEMENT 7.5 MG/0.5ML ~~LOC~~ SOLN: 7.5000 mg | SUBCUTANEOUS | 0 refills | Status: DC

## 2024-07-18 NOTE — Telephone Encounter (Signed)
 Please verify the patient's last dose taken. A prescription for Zepbound  7.5 mg weekly has been sent to the pharmacy, provided the last dose taken was 5 mg.

## 2024-07-18 NOTE — Telephone Encounter (Signed)
 Rx sent.

## 2024-07-22 LAB — ENHANCED LIVER FIBROSIS (ELF): ENHANCED LIVER FIBROSIS (ELF) SCORE: 7.87 (ref <9.80)

## 2024-07-23 ENCOUNTER — Ambulatory Visit: Payer: Self-pay | Admitting: Gastroenterology

## 2024-08-02 ENCOUNTER — Encounter: Payer: Self-pay | Admitting: Cardiovascular Disease

## 2024-08-05 ENCOUNTER — Encounter: Payer: Self-pay | Admitting: Radiology

## 2024-08-06 ENCOUNTER — Other Ambulatory Visit: Payer: Self-pay | Admitting: Gastroenterology

## 2024-08-06 MED ORDER — LINACLOTIDE 72 MCG PO CAPS: 72.0000 ug | ORAL_CAPSULE | Freq: Every day | ORAL | 3 refills | Status: AC

## 2024-08-07 ENCOUNTER — Other Ambulatory Visit (HOSPITAL_COMMUNITY): Payer: Self-pay

## 2024-08-07 NOTE — Telephone Encounter (Signed)
 Pharmacy Patient Advocate Encounter   Received notification from CoverMyMeds that prior authorization for Topiramate  ER 200mg  Capsule is required/requested.   Insurance verification completed.   The patient is insured through [facility].   Per test claim: PA required; PA submitted to above mentioned insurance via Latent Key/confirmation #/EOC [id number] Status is pending

## 2024-08-12 NOTE — Telephone Encounter (Signed)
 Called pt at [phone]. LVM for her to call office.

## 2024-08-12 NOTE — Telephone Encounter (Signed)
 Pharmacy Patient Advocate Encounter  Received notification from [facility] that Prior Authorization for Topiramate  ER has been DENIED.  Full denial letter will be uploaded to the media tab. See denial reason below.   PA #/Case ID/Reference #: [id number]

## 2024-08-13 ENCOUNTER — Other Ambulatory Visit: Payer: Self-pay | Admitting: Family Medicine

## 2024-08-13 NOTE — Telephone Encounter (Signed)
 Called pt at [phone]. Went straight to VM. Mailbox full. Sent mychart.

## 2024-08-13 NOTE — Telephone Encounter (Signed)
 I called pt mobile went to VM, and VM full, could not LM.  We have attempted to call her multiple times and mychart.  Will close at this time.

## 2024-08-19 ENCOUNTER — Other Ambulatory Visit: Payer: Self-pay | Admitting: Family Medicine

## 2024-08-19 ENCOUNTER — Encounter: Payer: Self-pay | Admitting: Family Medicine

## 2024-08-19 MED ORDER — ZEPBOUND 10 MG/0.5ML ~~LOC~~ SOAJ: 10.0000 mg | SUBCUTANEOUS | 0 refills | Status: DC

## 2024-09-04 ENCOUNTER — Other Ambulatory Visit: Payer: Self-pay | Admitting: Family Medicine

## 2024-09-04 MED ORDER — ZEPBOUND 12.5 MG/0.5ML ~~LOC~~ SOAJ: 12.5000 mg | SUBCUTANEOUS | 0 refills | Status: DC

## 2024-09-11 ENCOUNTER — Telehealth: Payer: BC Managed Care – PPO | Admitting: Neurology

## 2024-09-17 ENCOUNTER — Other Ambulatory Visit (HOSPITAL_COMMUNITY): Payer: Self-pay | Admitting: Family Medicine

## 2024-09-17 DIAGNOSIS — Z1231 Encounter for screening mammogram for malignant neoplasm of breast: Secondary | ICD-10-CM

## 2024-10-09 ENCOUNTER — Other Ambulatory Visit: Payer: Self-pay | Admitting: *Deleted

## 2024-10-09 DIAGNOSIS — K900 Celiac disease: Secondary | ICD-10-CM

## 2024-10-09 DIAGNOSIS — R5383 Other fatigue: Secondary | ICD-10-CM

## 2024-10-09 NOTE — Telephone Encounter (Signed)
Labs entered into Epic  °

## 2024-10-14 ENCOUNTER — Ambulatory Visit (HOSPITAL_COMMUNITY)
Admission: RE | Admit: 2024-10-14 | Discharge: 2024-10-14 | Disposition: A | Discharge status: Home / Self Care | Source: Ambulatory Visit | Attending: Family Medicine | Admitting: Family Medicine

## 2024-10-14 DIAGNOSIS — Z1231 Encounter for screening mammogram for malignant neoplasm of breast: Secondary | ICD-10-CM | POA: Insufficient documentation

## 2024-10-15 ENCOUNTER — Ambulatory Visit (INDEPENDENT_AMBULATORY_CARE_PROVIDER_SITE_OTHER): Admitting: Adult Health

## 2024-10-15 ENCOUNTER — Other Ambulatory Visit (HOSPITAL_COMMUNITY)
Admission: RE | Admit: 2024-10-15 | Discharge: 2024-10-15 | Disposition: A | Source: Ambulatory Visit | Attending: Adult Health | Admitting: Adult Health

## 2024-10-15 ENCOUNTER — Encounter: Payer: Self-pay | Admitting: Adult Health

## 2024-10-15 VITALS — BP 120/82 | HR 76 | Ht 64.0 in | Wt 248.0 lb

## 2024-10-15 DIAGNOSIS — I8393 Asymptomatic varicose veins of bilateral lower extremities: Secondary | ICD-10-CM | POA: Insufficient documentation

## 2024-10-15 DIAGNOSIS — Z975 Presence of (intrauterine) contraceptive device: Secondary | ICD-10-CM

## 2024-10-15 DIAGNOSIS — Z01419 Encounter for gynecological examination (general) (routine) without abnormal findings: Secondary | ICD-10-CM | POA: Insufficient documentation

## 2024-10-15 DIAGNOSIS — L700 Acne vulgaris: Secondary | ICD-10-CM | POA: Insufficient documentation

## 2024-10-15 NOTE — Progress Notes (Signed)
 Patient ID: Kristin [name], female   DOB: 1981-09-30, 44 y.o.   MRN: [id number] History of Present Illness: Kristin [name] is a 44 year old black female, divorced, [id number], in for a well woman gyn exam and pap.  PCP is Meade Gerlach NP   Current Medications, Allergies, Past Medical History, Past Surgical History, Family History and Social History were reviewed in [employer] record.     Review of Systems: Patient denies any headaches, hearing loss, fatigue, blurred vision, shortness of breath, chest pain, abdominal pain, problems with bowel movements(constipated at times, sees GI), urination, or intercourse. No joint pain or mood swings.  Had low pressure, over bladder   Physical Exam:BP 120/82 (BP Location: Left Arm, Patient Position: Sitting, Cuff Size: Large)   Pulse 76   Ht 5' 4 (1.626 m)   Wt 248 lb (112.5 kg)   BMI 42.57 kg/m   General:  Well developed, well nourished, no acute distress Skin:  Warm and dry Neck:  Midline trachea, normal thyroid , good ROM, no lymphadenopathy Lungs; Clear to auscultation bilaterally Breast:  No dominant palpable mass, retraction, or nipple discharge, has comedone right breast and trunk Cardiovascular: Regular rate and rhythm Abdomen:  Soft, non tender, no hepatosplenomegaly Pelvic:  External genitalia is normal in appearance, no lesions.  The vagina is normal in appearance. Urethra has no lesions or masses. The cervix is bulbous.+IUD stings at os, Pap with GC/CHL and  HR HPV genotyping performed.  Uterus is felt to be normal size, shape, and contour.  No adnexal masses or tenderness noted.Bladder is non tender, no masses felt. Rectal: deferred Extremities/musculoskeletal:  No swelling noted, no clubbing or cyanosis, +varicosities both legs, Psych:  No mood changes, alert and cooperative,seems happy AA is 1 Fall risk is low    10/15/2024    9:43 AM 05/17/2024    9:11 AM 02/05/2024    9:41 AM  Depression screen PHQ 2/9   Decreased Interest 0 0 1  Down, Depressed, Hopeless 0 0 1  PHQ - 2 Score 0 0 2  Altered sleeping 1  1  Tired, decreased energy 1  1  Change in appetite 0  1  Feeling bad or failure about yourself  0  0  Trouble concentrating 1  0  Moving slowly or fidgety/restless 0  0  Suicidal thoughts 0  0  PHQ-9 Score 3  5   Difficult doing work/chores   Not difficult at all     Data saved with a previous flowsheet row definition       10/15/2024    9:44 AM 02/05/2024    9:41 AM 08/03/2023    8:47 AM 06/02/2023   10:02 AM  GAD 7 : Generalized Anxiety Score  Nervous, Anxious, on Edge 1 1 1  0  Control/stop worrying 0 1 0 0  Worry too much - different things 0 1 0 0  Trouble relaxing 0 1 1 2   Restless 0 0 0 0  Easily annoyed or irritable 1 1 1 2   Afraid - awful might happen 0 0 0 0  Total GAD 7 Score 2 5 3 4   Anxiety Difficulty  Not difficult at all  Somewhat difficult    Upstream - 10/15/24 0953       Pregnancy Intention Screening   Does the patient want to become pregnant in the next year? No    Does the patient's partner want to become pregnant in the next year? No    Would the patient like  to discuss contraceptive options today? No      Contraception Wrap Up   Current Method IUD or IUS    End Method IUD or IUS    Contraception Counseling Provided Yes         Examination chaperoned by Clarita Salt LPN   Impression and plan: 1. Encounter for gynecological examination with Papanicolaou smear of cervix (Primary) Pap sent Pap in 3 years if negative Physical in 1 year Labs per PCP Mammogram was yesterday at [facility] Colonoscopy per GI  - Cytology - PAP( [place])  2. IUD (intrauterine device) in place Mirena  placed November 2024  3. Comedones  4. Varicose veins of both lower extremities, unspecified whether complicated See [facility] in [place]

## 2024-10-17 ENCOUNTER — Ambulatory Visit: Payer: Self-pay | Admitting: Adult Health

## 2024-10-17 ENCOUNTER — Other Ambulatory Visit: Payer: Self-pay | Admitting: Gastroenterology

## 2024-10-17 LAB — CYTOLOGY - PAP
Chlamydia: NEGATIVE
Comment: NEGATIVE
Comment: NEGATIVE
Comment: NORMAL
Diagnosis: NEGATIVE
Diagnosis: REACTIVE
High risk HPV: NEGATIVE
Neisseria Gonorrhea: NEGATIVE

## 2024-10-21 LAB — ENHANCED LIVER FIBROSIS (ELF): ENHANCED LIVER FIBROSIS (ELF) SCORE: 7.8

## 2024-10-24 ENCOUNTER — Other Ambulatory Visit: Payer: Self-pay | Admitting: Family Medicine

## 2024-10-24 DIAGNOSIS — G43009 Migraine without aura, not intractable, without status migrainosus: Secondary | ICD-10-CM

## 2024-10-24 MED ORDER — RIZATRIPTAN BENZOATE 10 MG PO TABS: ORAL_TABLET | ORAL | 11 refills | Status: AC

## 2024-10-24 MED ORDER — ZEPBOUND 15 MG/0.5ML ~~LOC~~ SOAJ: 15.0000 mg | SUBCUTANEOUS | 0 refills | Status: AC

## 2024-10-24 MED ORDER — ZEMBRACE SYMTOUCH 3 MG/0.5ML ~~LOC~~ SOAJ: 3.0000 mg | Freq: Once | SUBCUTANEOUS | 11 refills | Status: AC | PRN

## 2024-10-24 NOTE — Telephone Encounter (Signed)
 Rx sent.

## 2024-10-28 ENCOUNTER — Other Ambulatory Visit (HOSPITAL_COMMUNITY): Payer: Self-pay

## 2024-10-28 ENCOUNTER — Ambulatory Visit: Payer: Self-pay | Admitting: Gastroenterology

## 2024-10-28 ENCOUNTER — Telehealth: Payer: Self-pay | Admitting: Pharmacy Technician

## 2024-10-28 NOTE — Telephone Encounter (Signed)
 Pharmacy Patient Advocate Encounter   Received notification from Onbase CMM KEY that prior authorization for Zembrance SymTouch 3mg  is required/requested.   Insurance verification completed.   The patient is insured through [facility].   Per test claim: PA started via CoverMyMeds, Key: [id number]. PA cancelled due to drug not covered by plan, may be excluded from the patient's benefit. However, It looks like it was filled 10/25/24 Key Archived

## 2024-10-29 ENCOUNTER — Other Ambulatory Visit (HOSPITAL_COMMUNITY): Payer: Self-pay

## 2024-10-29 ENCOUNTER — Telehealth: Payer: Self-pay | Admitting: Pharmacy Technician

## 2024-10-29 NOTE — Telephone Encounter (Signed)
 Pharmacy Patient Advocate Encounter   Received notification from [facility] KEY that prior authorization for Zepbound  15mg  is required/requested.   Insurance verification completed.   The patient is insured through [employer].   Per test claim: Refill too soon. PA is not needed at this time. Medication was filled 10/25/24. Next eligible fill date is 11/15/24. Archived Key [facility]

## 2024-10-29 NOTE — Progress Notes (Unsigned)
 "  Cardiology Office Note    Date:  10/29/2024  ID:  Kristin [name], DOB 04-Jun-1981, MRN [id number] Cardiologist: Maude Emmer, MD { :  History of Present Illness:    Kristin [name] is a 44 y.o. female with past medical history of palpitations (prior monitor showing PACs and PVCs), anxiety, depression, GERD and migraine headaches who presents to the office today for overdue annual follow-up.  She was last examined by Raphael Bring, PA in 06/2023 and had recently been treated for a URI and reported having chest pressure/fullness when on steroids.  Denied any specific anginal symptoms and did report occasional palpitations which would occur sporadically.  It was recommended try short-acting Propranolol  20 mg twice daily as needed and if she required daily therapy in the future, could consolidate to Propranolol  LA 60 mg daily. Given her episodes of atypical chest pain, a GXT was recommended for further assessment and this showed no significant EKG changes and she had a low risk Duke treadmill score of 6.  ROS: ***  Studies Reviewed:   EKG: EKG is*** ordered today and demonstrates ***   EKG Interpretation Date/Time:    Ventricular Rate:    PR Interval:    QRS Duration:    QT Interval:    QTC Calculation:   R Axis:      Text Interpretation:         Event Monitor: 11/2022 Patch Wear Time:  6 days and 14 hours (2024-01-29T16:35:46-0500 to 2024-02-05T07:11:24-0500)   Patient had a min HR of 49 bpm, max HR of 154 bpm, and avg HR of 74 bpm. Predominant underlying rhythm was Sinus Rhythm. Isolated SVEs were rare (<1.0%), SVE Couplets were rare (<1.0%), and SVE Triplets were rare (<1.0%). Isolated VEs were rare (<1.0%),  and no VE Couplets or VE Triplets were present. Ventricular Bigeminy was present.   Echocardiogram: 11/2022 IMPRESSIONS     1. Left ventricular ejection fraction, by estimation, is 55 to 60%. The  left ventricle has normal function. The left ventricle has no regional   wall motion abnormalities. There is mild concentric left ventricular  hypertrophy. Left ventricular diastolic  parameters are indeterminate.   2. Right ventricular systolic function is normal. The right ventricular  size is normal. There is normal pulmonary artery systolic pressure. The  estimated right ventricular systolic pressure is 24.0 mmHg.   3. The mitral valve is grossly normal. Trivial mitral valve  regurgitation.   4. The aortic valve is tricuspid. Aortic valve regurgitation is not  visualized. No aortic stenosis is present. Aortic valve mean gradient  measures 6.0 mmHg.   5. The inferior vena cava is normal in size with greater than 50%  respiratory variability, suggesting right atrial pressure of 3 mmHg.   Comparison(s): No prior Echocardiogram.   GXT: 06/2023   Exercise capacity was normal. Patient exercised for 6 min and 0 sec. Maximum HR of 157 bpm. MPHR 89.0%. Peak METS 7.0.   No ST deviation was noted. The ECG was negative for ischemia.  Low risk Duke treadmill score of 6.   Risk Assessment/Calculations:   {Does this patient have ATRIAL FIBRILLATION?:[phone]} No BP recorded.  {Refresh Note OR Click here to enter BP  :1}***         Physical Exam:   VS:  There were no vitals taken for this visit.   Wt Readings from Last 3 Encounters:  10/15/24 248 lb (112.5 kg)  06/17/24 263 lb 3.2 oz (119.4 kg)  05/17/24 258 lb 12.8 oz (117.4  kg)     GEN: Well nourished, well developed in no acute distress NECK: No JVD; No carotid bruits CARDIAC: ***RRR, no murmurs, rubs, gallops RESPIRATORY:  Clear to auscultation without rales, wheezing or rhonchi  ABDOMEN: Appears non-distended. No obvious abdominal masses. EXTREMITIES: No clubbing or cyanosis. No edema.  Distal pedal pulses are 2+ bilaterally.   Assessment and Plan:      {Are you ordering a CV Procedure (e.g. stress test, cath, DCCV, TEE, etc)?   Press F2        :[id number]}   Signed, Laymon CHRISTELLA Qua, PA-C    "

## 2024-10-30 ENCOUNTER — Ambulatory Visit: Admitting: Student

## 2024-11-04 ENCOUNTER — Encounter: Payer: Self-pay | Admitting: Family Medicine

## 2024-11-04 ENCOUNTER — Telehealth: Payer: Self-pay | Admitting: Family Medicine

## 2024-11-04 NOTE — Progress Notes (Unsigned)
" ° °  Virtual Visit via Video Note  I connected with Kristin [name] on 11/07/24 at  2:00 PM EST by a video enabled telemedicine application and verified that I am speaking with the correct person using two identifiers.  Patient Location: Home Provider Location: Home Office  I discussed the limitations, risks, security, and privacy concerns of performing an evaluation and management service by video and the availability of in person appointments. I also discussed with the patient that there may be a patient responsible charge related to this service. The patient expressed understanding and agreed to proceed.  Subjective: PCP: Kristin Meade PEDLAR, FNP  [name] chief complaint on file.  HPI The patient reports that she has not taken Zepbound  since 10/26/2024 and would like to restart therapy   ROS: Per HPI Current Medications[1]  Observations/Objective: There were [name] vitals filed for this visit. Physical Exam Patient is well-developed, well-nourished in [name] acute distress.  Resting comfortably at home.  Head is normocephalic, atraumatic.  [name] labored breathing.  Speech is clear and coherent with logical content.  Patient is alert and oriented at baseline.   Assessment and Plan: Morbid obesity (HCC)    Refills have been sent to your pharmacy. Recommendations: Decrease portion sizes. Reduce intake of sugars, sodium, and carbohydrates, and limit saturated fats. Increase fiber intake by incorporating more whole grains, fruits, and vegetables. Set realistic, healthy goals with a focus on lowering carbohydrates, sugar, and fat. Increase the variety of fruits and vegetables in your diet. Reduce or eliminate soda and limit processed foods. In addition to taking your weight loss medication as prescribed, aim for at least 150 minutes of moderate-intensity physical activity per week for optimal results.  Follow Up Instructions: [name] follow-ups on file.   I discussed the assessment and  treatment plan with the patient. The patient was provided an opportunity to ask questions, and all were answered. The patient agreed with the plan and demonstrated an understanding of the instructions.   The patient was advised to call back or seek an in-person evaluation if the symptoms worsen or if the condition fails to improve as anticipated.  The above assessment and management plan was discussed with the patient. The patient verbalized understanding of and has agreed to the management plan.   Meade [name] Edman, FNP     [1]  Current Outpatient Medications:    esomeprazole  (NEXIUM ) 40 MG capsule, Take 1 capsule (40 mg total) by mouth 2 (two) times daily before a meal., Disp: 60 capsule, Rfl: 5   levonorgestrel  (MIRENA ) 20 MCG/24HR IUD, 1 each by Intrauterine route once., Disp: , Rfl:    linaclotide  (LINZESS ) 72 MCG capsule, Take 1 capsule (72 mcg total) by mouth daily before breakfast., Disp: 90 capsule, Rfl: 3   rizatriptan  (MAXALT ) 10 MG tablet, May repeat in 2 hours if needed, Disp: 9 tablet, Rfl: 11   SUMAtriptan  Succinate (ZEMBRACE SYMTOUCH ) 3 MG/0.5ML SOAJ, Inject 3 mg into the skin once as needed. May repeat in 15 minutes. If symptoms persist, repeat in 2 hours. Max 4 injections daily., Disp: 3 mL, Rfl: 11   tirzepatide  (ZEPBOUND ) 15 MG/0.5ML Pen, Inject 15 mg into the skin once a week., Disp: 6 mL, Rfl: 0  "

## 2024-11-04 NOTE — Telephone Encounter (Signed)
 Please inform the patient that she will need to come in for a weight check as part of her weight loss treatment

## 2024-11-08 ENCOUNTER — Ambulatory Visit: Payer: Self-pay

## 2024-11-15 ENCOUNTER — Ambulatory Visit: Payer: Self-pay

## 2024-11-29 ENCOUNTER — Ambulatory Visit: Admitting: Cardiovascular Disease

## 2024-12-02 ENCOUNTER — Ambulatory Visit: Admitting: Gastroenterology

## 2024-12-05 ENCOUNTER — Ambulatory Visit: Admitting: Cardiovascular Disease

## 2025-04-07 ENCOUNTER — Ambulatory Visit: Payer: Self-pay
